# Patient Record
Sex: Male | Born: 1935 | Race: White | Hispanic: No | Smoking: Former smoker
Health system: Southern US, Community
[De-identification: ages and names within clinical notes are randomized; demographics above are authoritative.]

## PROBLEM LIST (undated history)

## (undated) DIAGNOSIS — R062 Wheezing: Secondary | ICD-10-CM

## (undated) DIAGNOSIS — J449 Chronic obstructive pulmonary disease, unspecified: Secondary | ICD-10-CM

## (undated) DIAGNOSIS — K219 Gastro-esophageal reflux disease without esophagitis: Secondary | ICD-10-CM

## (undated) DIAGNOSIS — E785 Hyperlipidemia, unspecified: Secondary | ICD-10-CM

## (undated) DIAGNOSIS — H9190 Unspecified hearing loss, unspecified ear: Secondary | ICD-10-CM

## (undated) DIAGNOSIS — R609 Edema, unspecified: Secondary | ICD-10-CM

## (undated) DIAGNOSIS — M1990 Unspecified osteoarthritis, unspecified site: Secondary | ICD-10-CM

## (undated) DIAGNOSIS — I10 Essential (primary) hypertension: Secondary | ICD-10-CM

## (undated) DIAGNOSIS — E119 Type 2 diabetes mellitus without complications: Secondary | ICD-10-CM

## (undated) DIAGNOSIS — R0600 Dyspnea, unspecified: Secondary | ICD-10-CM

## (undated) DIAGNOSIS — L570 Actinic keratosis: Secondary | ICD-10-CM

## (undated) DIAGNOSIS — J45909 Unspecified asthma, uncomplicated: Secondary | ICD-10-CM

## (undated) HISTORY — PX: EYE SURGERY: SHX253

## (undated) HISTORY — PX: BUNIONECTOMY: SHX129

## (undated) HISTORY — DX: Essential (primary) hypertension: I10

## (undated) HISTORY — DX: Hyperlipidemia, unspecified: E78.5

## (undated) HISTORY — DX: Actinic keratosis: L57.0

## (undated) HISTORY — PX: HAMMER TOE SURGERY: SHX385

## (undated) SURGERY — ELECTROMAGNETIC NAVIGATION BRONCHOSCOPY
Anesthesia: General | Laterality: Left

---
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
## 2014-02-05 DIAGNOSIS — E782 Mixed hyperlipidemia: Secondary | ICD-10-CM | POA: Insufficient documentation

## 2014-08-01 HISTORY — PX: COLONOSCOPY: SHX174

## 2014-08-29 DIAGNOSIS — K635 Polyp of colon: Secondary | ICD-10-CM | POA: Insufficient documentation

## 2014-09-19 ENCOUNTER — Ambulatory Visit: Payer: Self-pay | Admitting: Gastroenterology

## 2014-11-24 LAB — SURGICAL PATHOLOGY

## 2015-01-08 DIAGNOSIS — I10 Essential (primary) hypertension: Secondary | ICD-10-CM | POA: Insufficient documentation

## 2015-01-19 DIAGNOSIS — E119 Type 2 diabetes mellitus without complications: Secondary | ICD-10-CM | POA: Insufficient documentation

## 2015-09-22 DIAGNOSIS — E669 Obesity, unspecified: Secondary | ICD-10-CM | POA: Diagnosis not present

## 2015-09-22 DIAGNOSIS — J449 Chronic obstructive pulmonary disease, unspecified: Secondary | ICD-10-CM | POA: Diagnosis not present

## 2015-09-22 DIAGNOSIS — E539 Vitamin B deficiency, unspecified: Secondary | ICD-10-CM | POA: Diagnosis not present

## 2015-09-22 DIAGNOSIS — R5383 Other fatigue: Secondary | ICD-10-CM | POA: Diagnosis not present

## 2015-09-22 DIAGNOSIS — J45998 Other asthma: Secondary | ICD-10-CM | POA: Diagnosis not present

## 2015-09-22 DIAGNOSIS — E119 Type 2 diabetes mellitus without complications: Secondary | ICD-10-CM | POA: Diagnosis not present

## 2015-09-22 DIAGNOSIS — E7800 Pure hypercholesterolemia, unspecified: Secondary | ICD-10-CM | POA: Diagnosis not present

## 2015-09-22 DIAGNOSIS — E559 Vitamin D deficiency, unspecified: Secondary | ICD-10-CM | POA: Diagnosis not present

## 2015-09-22 DIAGNOSIS — E785 Hyperlipidemia, unspecified: Secondary | ICD-10-CM | POA: Diagnosis not present

## 2015-10-01 DIAGNOSIS — M545 Low back pain: Secondary | ICD-10-CM | POA: Diagnosis not present

## 2015-10-01 DIAGNOSIS — E119 Type 2 diabetes mellitus without complications: Secondary | ICD-10-CM | POA: Diagnosis not present

## 2015-10-01 DIAGNOSIS — R0989 Other specified symptoms and signs involving the circulatory and respiratory systems: Secondary | ICD-10-CM | POA: Diagnosis not present

## 2015-10-09 DIAGNOSIS — R5383 Other fatigue: Secondary | ICD-10-CM | POA: Diagnosis not present

## 2015-10-09 DIAGNOSIS — E539 Vitamin B deficiency, unspecified: Secondary | ICD-10-CM | POA: Diagnosis not present

## 2015-10-09 DIAGNOSIS — E119 Type 2 diabetes mellitus without complications: Secondary | ICD-10-CM | POA: Diagnosis not present

## 2015-10-09 DIAGNOSIS — E785 Hyperlipidemia, unspecified: Secondary | ICD-10-CM | POA: Diagnosis not present

## 2015-11-13 ENCOUNTER — Encounter: Payer: Self-pay | Admitting: Emergency Medicine

## 2015-11-13 ENCOUNTER — Emergency Department
Admission: EM | Admit: 2015-11-13 | Discharge: 2015-11-13 | Disposition: A | Discharge status: Home / Self Care | Payer: Medicare Other | Attending: Emergency Medicine | Admitting: Emergency Medicine

## 2015-11-13 DIAGNOSIS — J45909 Unspecified asthma, uncomplicated: Secondary | ICD-10-CM | POA: Diagnosis not present

## 2015-11-13 DIAGNOSIS — E119 Type 2 diabetes mellitus without complications: Secondary | ICD-10-CM | POA: Insufficient documentation

## 2015-11-13 DIAGNOSIS — R040 Epistaxis: Secondary | ICD-10-CM | POA: Insufficient documentation

## 2015-11-13 DIAGNOSIS — Z87891 Personal history of nicotine dependence: Secondary | ICD-10-CM | POA: Diagnosis not present

## 2015-11-13 HISTORY — DX: Type 2 diabetes mellitus without complications: E11.9

## 2015-11-13 HISTORY — DX: Unspecified asthma, uncomplicated: J45.909

## 2015-11-13 HISTORY — DX: Unspecified osteoarthritis, unspecified site: M19.90

## 2015-11-13 MED ORDER — BACITRACIN ZINC 500 UNIT/GM EX OINT
TOPICAL_OINTMENT | Freq: Once | CUTANEOUS | Status: AC
  Administered 2015-11-13: 1 via TOPICAL

## 2015-11-13 MED ORDER — BACITRACIN ZINC 500 UNIT/GM EX OINT
TOPICAL_OINTMENT | CUTANEOUS | Status: AC
  Administered 2015-11-13: 1 via TOPICAL
  Filled 2015-11-13: qty 0.9

## 2015-11-13 NOTE — ED Notes (Signed)
Patient reports nose bleed started day before yesterday and stopped.  This morning patient states it started back as a trickle and then increased to steady flow.  Patient states blood is thick.  Denies injury. Patient states he is not on any blood thinners not even aspirin. Nasal clamp removed at this time per patient request as it does not stay on.

## 2015-11-13 NOTE — Discharge Instructions (Signed)
Use an antibacterial ointment such as Neosporin, or Vaseline to the middle, inside of your nose 2-3 times a day. This will prevent further bleeding. You may also use the nasal clips but did not use them for more than 15 minutes at a time. Nosebleed Nosebleeds are common. A nosebleed can be caused by many things, including:  Getting hit hard in the nose.  Infections.  Dryness in your nose.  A dry climate.  Medicines.  Picking your nose.  Your home heating and cooling systems. HOME CARE   Try controlling your nosebleed by pinching your nostrils gently. Do this for at least 10 minutes.  Avoid blowing or sniffing your nose for a number of hours after having a nosebleed.  Do not put gauze inside of your nose yourself. If your nose was packed by your doctor, try to keep the pack inside of your nose until your doctor removes it.  If a gauze pack was used and it starts to fall out, gently replace it or cut off the end of it.  If a balloon catheter was used to pack your nose, do not cut or remove it unless told by your doctor.  Avoid lying down while you are having a nosebleed. Sit up and lean forward.  Use a nasal spray decongestant to help with a nosebleed as told by your doctor.  Do not use petroleum jelly or mineral oil in your nose. These can drip into your lungs.  Keep your house humid by using:  Less air conditioning.  A humidifier.  Aspirin and blood thinners make bleeding more likely. If you are prescribed these medicines and you have nosebleeds, ask your doctor if you should stop taking the medicines or adjust the dose. Do not stop medicines unless told by your doctor.  Resume your normal activities as you are able. Avoid straining, lifting, or bending at your waist for several days.  If your nosebleed was caused by dryness in your nose, use over-the-counter saline nasal spray or gel. If you must use a lubricant:  Choose one that is water-soluble.  Use it only as  needed.  Do not use it within several hours of lying down.  Keep all follow-up visits as told by your doctor. This is important. GET HELP IF:  You have a fever.  You get frequent nosebleeds.  You are getting nosebleeds more often. GET HELP RIGHT AWAY IF:  Your nosebleed lasts longer than 20 minutes.  Your nosebleed occurs after an injury to your face, and your nose looks crooked or broken.  You have unusual bleeding from other parts of your body.  You have unusual bruising on other parts of your body.  You feel light-headed or dizzy.  You become sweaty.  You throw up (vomit) blood.  You have a nosebleed after a head injury.   This information is not intended to replace advice given to you by your health care provider. Make sure you discuss any questions you have with your health care provider.   Document Released: 04/26/2008 Document Revised: 08/08/2014 Document Reviewed: 03/03/2014 Elsevier Interactive Patient Education [employer].

## 2015-11-13 NOTE — ED Provider Notes (Signed)
[facility] Emergency Department Provider Note  ____________________________________________  Time seen: Approximately [id number] PM  I have reviewed the triage vital signs and the nursing notes.   HISTORY  Chief Complaint Epistaxis   HPI Cody Bridges is a 82 y.o. male with a history of diabetes who is presenting to the emergency department today with epistaxis, especially from the right near. He says it has been intermittent all day and started his trickle and then increased to a steady flow. He also says he has had several small clots. Denies being on any blood thinners, even aspirin. He denies picking his nose. Says that he did feel he had some nasal congestion recently and was trying to blow his nose when the bleeding began.Says that he has had a history of bloody noses in the last time he needed one was several years ago.   Past Medical History  Diagnosis Date  . Diabetes mellitus without complication ([place])   . Arthritis   . Asthma     There are no active problems to display for this patient.   No past surgical history on file.  No current outpatient prescriptions on file.  Allergies Review of patient's allergies indicates not on file.  No family history on file.  Social History Social History  Substance Use Topics  . Smoking status: Former Research scientist (life sciences)  . Smokeless tobacco: None  . Alcohol Use: No    Review of Systems Constitutional: No fever/chills Eyes: No visual changes. ENT: No sore throat. Cardiovascular: Denies chest pain. Respiratory: Denies shortness of breath. Gastrointestinal: No abdominal pain.  No nausea, no vomiting.  No diarrhea.  No constipation. Genitourinary: Negative for dysuria. Musculoskeletal: Negative for back pain. Skin: Negative for rash. Neurological: Negative for headaches, focal weakness or numbness.  10-point ROS otherwise negative.  ____________________________________________   PHYSICAL EXAM:  VITAL  SIGNS: ED Triage Vitals  Enc Vitals Group     BP 11/13/15 1527 165/95 mmHg     Pulse Rate 11/13/15 1527 111     Resp 11/13/15 1527 20     Temp 11/13/15 1527 98.5 F (36.9 C)     Temp Source 11/13/15 1527 Oral     SpO2 11/13/15 1527 96 %     Weight 11/13/15 1527 200 lb (90.719 kg)     Height 11/13/15 1527 5\' 10"  (1.778 m)     Head Cir --      Peak Flow --      Pain Score 11/13/15 1528 0     Pain Loc --      Pain Edu? --      Excl. in [place]? --     Constitutional: Alert and oriented. Well appearing and in no acute distress. Eyes: Conjunctivae are normal. PERRL. EOMI. Head: Atraumatic. Nose: No congestion/rhinnorhea. Right sided nasal septum with several small and prominent vessels over the anterior septum. No active bleeding. Left side of the septum again noted with several very small prominent vessels without any active bleeding. Mouth/Throat: Mucous membranes are moist.  No bleeding to the posterior pharynx. Neck: No stridor.   Cardiovascular: Normal rate, regular rhythm. Heart rate 88 in the room. Respiratory: Normal respiratory effort.  No retractions. Lungs CTAB. Gastrointestinal: Soft and nontender. No distention.  Musculoskeletal: No lower extremity tenderness nor edema.  No joint effusions. Neurologic:  Normal speech and language. No gross focal neurologic deficits are appreciated. Skin:  Skin is warm, dry and intact. No rash noted. Psychiatric: Mood and affect are normal. Speech and behavior  are normal.  ____________________________________________   LABS (all labs ordered are listed, but only abnormal results are displayed)  Labs Reviewed - No data to display ____________________________________________  EKG   ____________________________________________  RADIOLOGY   ____________________________________________   PROCEDURES  ____________________________________________   INITIAL IMPRESSION / ASSESSMENT AND PLAN / ED COURSE  Pertinent labs & imaging  results that were available during my care of the patient were reviewed by me and considered in my medical decision making (see chart for details).  Instructed the patient and proper way to use the nasal clamps and knows to use them for 15 minutes of his nose against the bleeding again. We will be giving him bacitracin to the bilateral nasal septum prior to discharge. He knows to use antibacterial ointment such as Neosporin or Vaseline 2-3 times a day to further prevent any nosebleeds. Understands plan and willing to comply. Given the nasal clips to go home with. ____________________________________________   FINAL CLINICAL IMPRESSION(S) / ED DIAGNOSES  Anterior epistaxis.    Orbie Pyo, MD 11/13/15 [phone]

## 2015-12-17 DIAGNOSIS — B351 Tinea unguium: Secondary | ICD-10-CM | POA: Diagnosis not present

## 2015-12-17 DIAGNOSIS — M79671 Pain in right foot: Secondary | ICD-10-CM | POA: Diagnosis not present

## 2015-12-17 DIAGNOSIS — M79672 Pain in left foot: Secondary | ICD-10-CM | POA: Diagnosis not present

## 2015-12-30 DIAGNOSIS — E7800 Pure hypercholesterolemia, unspecified: Secondary | ICD-10-CM | POA: Diagnosis not present

## 2015-12-30 DIAGNOSIS — E119 Type 2 diabetes mellitus without complications: Secondary | ICD-10-CM | POA: Diagnosis not present

## 2015-12-30 DIAGNOSIS — E785 Hyperlipidemia, unspecified: Secondary | ICD-10-CM | POA: Diagnosis not present

## 2015-12-30 DIAGNOSIS — J45998 Other asthma: Secondary | ICD-10-CM | POA: Diagnosis not present

## 2015-12-30 DIAGNOSIS — R5383 Other fatigue: Secondary | ICD-10-CM | POA: Diagnosis not present

## 2015-12-30 DIAGNOSIS — J449 Chronic obstructive pulmonary disease, unspecified: Secondary | ICD-10-CM | POA: Diagnosis not present

## 2015-12-30 DIAGNOSIS — E559 Vitamin D deficiency, unspecified: Secondary | ICD-10-CM | POA: Diagnosis not present

## 2016-01-27 DIAGNOSIS — E782 Mixed hyperlipidemia: Secondary | ICD-10-CM | POA: Diagnosis not present

## 2016-01-27 DIAGNOSIS — E119 Type 2 diabetes mellitus without complications: Secondary | ICD-10-CM | POA: Diagnosis not present

## 2016-01-27 DIAGNOSIS — I10 Essential (primary) hypertension: Secondary | ICD-10-CM | POA: Diagnosis not present

## 2016-01-27 DIAGNOSIS — R0609 Other forms of dyspnea: Secondary | ICD-10-CM | POA: Diagnosis not present

## 2016-03-03 DIAGNOSIS — Z1389 Encounter for screening for other disorder: Secondary | ICD-10-CM | POA: Diagnosis not present

## 2016-03-03 DIAGNOSIS — E119 Type 2 diabetes mellitus without complications: Secondary | ICD-10-CM | POA: Diagnosis not present

## 2016-03-03 DIAGNOSIS — E559 Vitamin D deficiency, unspecified: Secondary | ICD-10-CM | POA: Diagnosis not present

## 2016-03-03 DIAGNOSIS — I10 Essential (primary) hypertension: Secondary | ICD-10-CM | POA: Diagnosis not present

## 2016-03-03 DIAGNOSIS — Z0000 Encounter for general adult medical examination without abnormal findings: Secondary | ICD-10-CM | POA: Diagnosis not present

## 2016-03-03 DIAGNOSIS — R5383 Other fatigue: Secondary | ICD-10-CM | POA: Diagnosis not present

## 2016-03-03 DIAGNOSIS — E785 Hyperlipidemia, unspecified: Secondary | ICD-10-CM | POA: Diagnosis not present

## 2016-03-03 DIAGNOSIS — M545 Low back pain: Secondary | ICD-10-CM | POA: Diagnosis not present

## 2016-03-03 DIAGNOSIS — E7800 Pure hypercholesterolemia, unspecified: Secondary | ICD-10-CM | POA: Diagnosis not present

## 2016-04-18 DIAGNOSIS — Z23 Encounter for immunization: Secondary | ICD-10-CM | POA: Diagnosis not present

## 2016-06-01 DIAGNOSIS — N400 Enlarged prostate without lower urinary tract symptoms: Secondary | ICD-10-CM | POA: Diagnosis not present

## 2016-06-01 DIAGNOSIS — E119 Type 2 diabetes mellitus without complications: Secondary | ICD-10-CM | POA: Diagnosis not present

## 2016-06-01 DIAGNOSIS — E7800 Pure hypercholesterolemia, unspecified: Secondary | ICD-10-CM | POA: Diagnosis not present

## 2016-06-01 DIAGNOSIS — N5201 Erectile dysfunction due to arterial insufficiency: Secondary | ICD-10-CM | POA: Diagnosis not present

## 2016-06-01 DIAGNOSIS — J449 Chronic obstructive pulmonary disease, unspecified: Secondary | ICD-10-CM | POA: Diagnosis not present

## 2016-06-01 DIAGNOSIS — R5381 Other malaise: Secondary | ICD-10-CM | POA: Diagnosis not present

## 2016-06-01 DIAGNOSIS — E559 Vitamin D deficiency, unspecified: Secondary | ICD-10-CM | POA: Diagnosis not present

## 2016-06-01 DIAGNOSIS — E669 Obesity, unspecified: Secondary | ICD-10-CM | POA: Diagnosis not present

## 2016-06-01 DIAGNOSIS — E785 Hyperlipidemia, unspecified: Secondary | ICD-10-CM | POA: Diagnosis not present

## 2016-08-30 DIAGNOSIS — E782 Mixed hyperlipidemia: Secondary | ICD-10-CM | POA: Diagnosis not present

## 2016-08-30 DIAGNOSIS — E119 Type 2 diabetes mellitus without complications: Secondary | ICD-10-CM | POA: Diagnosis not present

## 2016-08-30 DIAGNOSIS — R0602 Shortness of breath: Secondary | ICD-10-CM | POA: Insufficient documentation

## 2016-08-30 DIAGNOSIS — I10 Essential (primary) hypertension: Secondary | ICD-10-CM | POA: Diagnosis not present

## 2016-09-07 ENCOUNTER — Telehealth: Payer: Self-pay | Admitting: *Deleted

## 2016-09-07 DIAGNOSIS — E785 Hyperlipidemia, unspecified: Secondary | ICD-10-CM | POA: Diagnosis not present

## 2016-09-07 DIAGNOSIS — R7989 Other specified abnormal findings of blood chemistry: Secondary | ICD-10-CM | POA: Diagnosis not present

## 2016-09-07 DIAGNOSIS — E7800 Pure hypercholesterolemia, unspecified: Secondary | ICD-10-CM | POA: Diagnosis not present

## 2016-09-07 DIAGNOSIS — R5383 Other fatigue: Secondary | ICD-10-CM | POA: Diagnosis not present

## 2016-09-07 DIAGNOSIS — E559 Vitamin D deficiency, unspecified: Secondary | ICD-10-CM | POA: Diagnosis not present

## 2016-09-07 DIAGNOSIS — E119 Type 2 diabetes mellitus without complications: Secondary | ICD-10-CM | POA: Diagnosis not present

## 2016-09-07 NOTE — Telephone Encounter (Signed)
I'm sorry but I cannot take on any more patients at this time.

## 2016-09-07 NOTE — Telephone Encounter (Signed)
Pt requested to establish care with Dr. Derrel Nip . Pt is aware that Dr. Derrel Nip is not accepting pt .

## 2016-09-08 NOTE — Telephone Encounter (Signed)
I called pt to advise of below and offer another provider who is accepting new patients. No answer.  Left detailed mess informing pt to call us back and schedule with a different provider if he desires to do so.

## 2016-09-20 ENCOUNTER — Encounter: Payer: Self-pay | Admitting: Family Medicine

## 2016-09-20 ENCOUNTER — Ambulatory Visit (INDEPENDENT_AMBULATORY_CARE_PROVIDER_SITE_OTHER): Payer: Medicare Other | Admitting: Family Medicine

## 2016-09-20 VITALS — BP 132/80 | HR 86 | Temp 97.6°F | Ht 70.0 in | Wt 209.6 lb

## 2016-09-20 DIAGNOSIS — E119 Type 2 diabetes mellitus without complications: Secondary | ICD-10-CM | POA: Diagnosis not present

## 2016-09-20 DIAGNOSIS — R0609 Other forms of dyspnea: Secondary | ICD-10-CM | POA: Diagnosis not present

## 2016-09-20 DIAGNOSIS — J449 Chronic obstructive pulmonary disease, unspecified: Secondary | ICD-10-CM | POA: Insufficient documentation

## 2016-09-20 DIAGNOSIS — J4540 Moderate persistent asthma, uncomplicated: Secondary | ICD-10-CM | POA: Diagnosis not present

## 2016-09-20 DIAGNOSIS — R0600 Dyspnea, unspecified: Secondary | ICD-10-CM

## 2016-09-20 DIAGNOSIS — K439 Ventral hernia without obstruction or gangrene: Secondary | ICD-10-CM | POA: Diagnosis not present

## 2016-09-20 MED ORDER — SYMBICORT 160-4.5 MCG/ACT IN AERO: 2.0000 | INHALATION_SPRAY | Freq: Two times a day (BID) | RESPIRATORY_TRACT | 3 refills | Status: DC

## 2016-09-20 NOTE — Progress Notes (Signed)
[name], MD Phone: [phone]  Cody Bridges is a 83 y.o. male who presents today for new patient exam.  Patient notes 2-3 weeks ago when he was at the cardiology office he noted a bulge in his abdomen when he is laying down. There is no pain in this area. It always reduces. He notes he's had daily bowel movements. No history of a hernia. No prior abdominal surgeries. Was advised by his prior PCP that this was not worrisome and did not need to be evaluated per his report.  Patient has a history of asthma. He notes chronic exertional dyspnea that may have gotten worse over a number of months. He's been evaluated by cardiology and it appears they did not think it was cardiac related. No further workup was undertaken by cardiology. He does wheeze occasionally. No cough. No nighttime symptoms. He has not seen a pulmonologist. Patient does note he uses albuterol inhaler several times a day.  Patient is also on metformin for diabetes. He reports an A1c was checked fairly recently through his prior PCP.  Active Ambulatory Problems    Diagnosis Date Noted  . Ventral hernia 09/20/2016  . Diabetes mellitus without complication ([place]) [id number]  . Asthma 09/20/2016   Resolved Ambulatory Problems    Diagnosis Date Noted  . No Resolved Ambulatory Problems   Past Medical History:  Diagnosis Date  . Arthritis   . Asthma   . Diabetes mellitus without complication ([place])   . Hyperlipidemia   . Hypertension     Family History  Problem Relation Age of Onset  . Cancer Mother   . Cancer Sister     Social History   Social History  . Marital status: Widowed    Spouse name: N/A  . Number of children: N/A  . Years of education: N/A   Occupational History  . Not on file.   Social History Main Topics  . Smoking status: Former Research scientist (life sciences)  . Smokeless tobacco: Never Used  . Alcohol use No  . Drug use: No  . Sexual activity: Not on file   Other Topics Concern  . Not on file    Social History Narrative  . No narrative on file    ROS  General:  Negative for nexplained weight loss, fever Skin: Negative for new or changing mole, sore that won't heal HEENT: Negative for trouble hearing, trouble seeing, ringing in ears, mouth sores, hoarseness, change in voice, dysphagia. CV:  Negative for chest pain, edema, palpitations Resp: Positive for dyspnea, Negative for cough, hemoptysis GI: Negative for nausea, vomiting, diarrhea, constipation, abdominal pain, melena, hematochezia. GU: Negative for dysuria, incontinence, urinary hesitance, hematuria, vaginal or penile discharge, polyuria, sexual difficulty, lumps in testicle or breasts MSK: Negative for muscle cramps or aches, joint pain or swelling Neuro: Negative for headaches, weakness, numbness, dizziness, passing out/fainting Psych: Negative for depression, anxiety, memory problems  Objective  Physical Exam Vitals:   09/20/16 1456  BP: 132/80  Pulse: 86  Temp: 97.6 F (36.4 C)    BP Readings from Last 3 Encounters:  09/20/16 132/80  11/13/15 (!) 125/96   Wt Readings from Last 3 Encounters:  09/20/16 209 lb 9.6 oz (95.1 kg)  11/13/15 200 lb (90.7 kg)    Physical Exam  Constitutional: No distress.  HENT:  Head: Normocephalic and atraumatic.  Mouth/Throat: Oropharynx is clear and moist. No oropharyngeal exudate.  Eyes: Conjunctivae are normal. Pupils are equal, round, and reactive to light.  Cardiovascular: Normal rate, regular rhythm and  normal heart sounds.   Pulmonary/Chest: Effort normal and breath sounds normal.  Abdominal: Soft. Bowel sounds are normal. He exhibits no distension and no mass. There is no tenderness. There is no rebound and no guarding.  Possible ventral hernia just proximal to the umbilicus though this could represent rectus diastases  Musculoskeletal: He exhibits no edema.  Neurological: He is alert. Gait normal.  Skin: Skin is warm and dry. He is not diaphoretic.   Psychiatric: Mood and affect normal.     Assessment/Plan:   Ventral hernia Patient with potential ventral hernia on exam. Could also be rectus diastases. Fully reduces. No signs of obstruction or incarceration. We will obtain a CT scan to evaluate further. Given return precautions.  Diabetes mellitus without complication (HCC) Continue metformin. We'll request records from his prior PCP.  Asthma Suspect patient's dyspnea is likely related to his asthma that is undertreated given prior evaluation by cardiology felt to be no cardiac component. Discussed that the nebulizer medication has a similar medication in it to his Symbicort. He will discontinue the nebulizer. He has not been using the Symbicort twice daily on a consistent basis. Discussed having him take this twice daily. He can continue the albuterol as needed. We will refer to pulmonology for further evaluation. He is given return precautions.   Orders Placed This Encounter  Procedures  . CT Abdomen Pelvis W Contrast    Standing Status:   Future    Standing Expiration Date:   12/18/2017    Order Specific Question:   If indicated for the ordered procedure, I authorize the administration of contrast media per Radiology protocol    Answer:   Yes    Order Specific Question:   Reason for Exam (SYMPTOM  OR DIAGNOSIS REQUIRED)    Answer:   possible ventral hernia with no signs of incarceration or obstruction    Order Specific Question:   Preferred imaging location?    Answer:   [facility]  . Ambulatory referral to Pulmonology    Referral Priority:   Routine    Referral Type:   Consultation    Referral Reason:   Specialty Services Required    Requested Specialty:   Pulmonary Disease    Number of Visits Requested:   [place], MD [place]

## 2016-09-20 NOTE — Assessment & Plan Note (Signed)
Continue metformin. We'll request records from his prior PCP.

## 2016-09-20 NOTE — Patient Instructions (Addendum)
Nice to meet you. We will request your records from your prior PCPs office to obtain lab work. We will obtain a CT scan of your abdomen and pelvis to evaluate for hernia. We will refer you to pulmonology for evaluation of her dyspnea and asthma. Please consistently use the symbicort 2 puffs twice daily. Please stop using the brovana nebulizer.  If you become increasingly short of breath, develop chest pain, abdominal pain, or hernia becomes stuck out or painful, or any new or changing symptoms please seek medical attention.

## 2016-09-20 NOTE — Assessment & Plan Note (Signed)
Suspect patient's dyspnea is likely related to his asthma that is undertreated given prior evaluation by cardiology felt to be no cardiac component. Discussed that the nebulizer medication has a similar medication in it to his Symbicort. He will discontinue the nebulizer. He has not been using the Symbicort twice daily on a consistent basis. Discussed having him take this twice daily. He can continue the albuterol as needed. We will refer to pulmonology for further evaluation. He is given return precautions.

## 2016-09-20 NOTE — Assessment & Plan Note (Addendum)
Patient with potential ventral hernia on exam. Could also be rectus diastases. Fully reduces. No signs of obstruction or incarceration. We will obtain a CT scan to evaluate further. Given return precautions.

## 2016-09-20 NOTE — Progress Notes (Signed)
Pre visit review using our clinic review tool, if applicable. No additional management support is needed unless otherwise documented below in the visit note. 

## 2016-09-30 ENCOUNTER — Ambulatory Visit
Admission: RE | Admit: 2016-09-30 | Discharge: 2016-09-30 | Disposition: A | Discharge status: Home / Self Care | Payer: Medicare Other | Source: Ambulatory Visit | Attending: Family Medicine | Admitting: Family Medicine

## 2016-09-30 DIAGNOSIS — I700 Atherosclerosis of aorta: Secondary | ICD-10-CM | POA: Diagnosis not present

## 2016-09-30 DIAGNOSIS — K760 Fatty (change of) liver, not elsewhere classified: Secondary | ICD-10-CM | POA: Insufficient documentation

## 2016-09-30 DIAGNOSIS — K439 Ventral hernia without obstruction or gangrene: Secondary | ICD-10-CM | POA: Diagnosis present

## 2016-09-30 DIAGNOSIS — K5730 Diverticulosis of large intestine without perforation or abscess without bleeding: Secondary | ICD-10-CM | POA: Diagnosis not present

## 2016-09-30 DIAGNOSIS — R1903 Right lower quadrant abdominal swelling, mass and lump: Secondary | ICD-10-CM | POA: Diagnosis not present

## 2016-09-30 DIAGNOSIS — K4090 Unilateral inguinal hernia, without obstruction or gangrene, not specified as recurrent: Secondary | ICD-10-CM | POA: Insufficient documentation

## 2016-09-30 LAB — POCT I-STAT CREATININE: Creatinine, Ser: 1.1 mg/dL (ref 0.61–1.24)

## 2016-09-30 MED ORDER — IOPAMIDOL (ISOVUE-300) INJECTION 61%
100.0000 mL | Freq: Once | INTRAVENOUS | Status: AC | PRN
  Administered 2016-09-30: 100 mL via INTRAVENOUS

## 2016-10-07 ENCOUNTER — Other Ambulatory Visit: Payer: Self-pay | Admitting: Family Medicine

## 2016-10-07 DIAGNOSIS — K4090 Unilateral inguinal hernia, without obstruction or gangrene, not specified as recurrent: Secondary | ICD-10-CM

## 2016-10-17 ENCOUNTER — Encounter: Payer: Self-pay | Admitting: *Deleted

## 2016-10-24 ENCOUNTER — Encounter: Payer: Self-pay | Admitting: General Surgery

## 2016-10-24 ENCOUNTER — Encounter: Payer: Self-pay | Admitting: Family Medicine

## 2016-10-24 ENCOUNTER — Ambulatory Visit (INDEPENDENT_AMBULATORY_CARE_PROVIDER_SITE_OTHER): Payer: Medicare Other | Admitting: General Surgery

## 2016-10-24 ENCOUNTER — Ambulatory Visit (INDEPENDENT_AMBULATORY_CARE_PROVIDER_SITE_OTHER): Payer: Medicare Other | Admitting: Family Medicine

## 2016-10-24 VITALS — BP 132/74 | HR 66 | Resp 12 | Ht 70.0 in | Wt 207.0 lb

## 2016-10-24 VITALS — BP 120/70 | HR 87 | Temp 98.1°F | Wt 208.4 lb

## 2016-10-24 DIAGNOSIS — R197 Diarrhea, unspecified: Secondary | ICD-10-CM | POA: Diagnosis not present

## 2016-10-24 DIAGNOSIS — J4540 Moderate persistent asthma, uncomplicated: Secondary | ICD-10-CM

## 2016-10-24 DIAGNOSIS — M6208 Separation of muscle (nontraumatic), other site: Secondary | ICD-10-CM | POA: Diagnosis not present

## 2016-10-24 DIAGNOSIS — K4090 Unilateral inguinal hernia, without obstruction or gangrene, not specified as recurrent: Secondary | ICD-10-CM

## 2016-10-24 DIAGNOSIS — E119 Type 2 diabetes mellitus without complications: Secondary | ICD-10-CM

## 2016-10-24 DIAGNOSIS — I10 Essential (primary) hypertension: Secondary | ICD-10-CM | POA: Insufficient documentation

## 2016-10-24 DIAGNOSIS — R21 Rash and other nonspecific skin eruption: Secondary | ICD-10-CM

## 2016-10-24 NOTE — Patient Instructions (Signed)
Patient to return as needed. 

## 2016-10-24 NOTE — Progress Notes (Signed)
  [name], MD Phone: [phone]  Cody Bridges is a 83 y.o. male who presents today for follow-up.  Patient reports he had diarrhea starting 1-2 days after his CT scan. Notes it tapered off and has resolved over the last week. No blood in his stool. He did have nausea and vomiting as well. No other changes noted. No issues currently.  Asthma: Continues to have issues with breathing. No chest pain. His breathing is unchanged. He is using Symbicort twice a day. Does note some wheezes. Not much cough. He sees pulmonology tomorrow. He's been evaluated by cardiology and his symptoms were felt not to be related to a cardiac cause.  Found to have a right inguinal hernia on CT scan. He saw Gen. surgery today and they recommended monitoring it. He notes no pain or bulges.  He notes a rash for the last couple of months on his right back. Notes it itches. He notes no new soaps or detergents. No contacts with this. No fevers. He's been using triamcinolone lotion with some benefit.  PMH: Former smoker   ROS see history of present illness  Objective  Physical Exam Vitals:   10/24/16 1600  BP: 120/70  Pulse: 87  Temp: 98.1 F (36.7 C)    BP Readings from Last 3 Encounters:  10/25/16 130/80  10/24/16 120/70  10/24/16 132/74   Wt Readings from Last 3 Encounters:  10/25/16 207 lb (93.9 kg)  10/24/16 208 lb 6.4 oz (94.5 kg)  10/24/16 207 lb (93.9 kg)    Physical Exam  Constitutional: No distress.  Cardiovascular: Normal rate, regular rhythm and normal heart sounds.   Pulmonary/Chest: Effort normal and breath sounds normal.  Abdominal: Soft. Bowel sounds are normal. He exhibits no distension. There is no tenderness. There is no rebound and no guarding.  Musculoskeletal: He exhibits no edema.  Neurological: He is alert. Gait normal.  Skin: Skin is warm and dry. He is not diaphoretic.  Scattered faint erythematous papular rash that has been excoriated over his right mid back      Assessment/Plan: Please see individual problem list.  Hypertension Well-controlled. Continue current medications. Check CMP.  Asthma Continues to have issues with this. No significant improvement with appropriate use of Symbicort. He will keep his appointment with the pulmonologist tomorrow.  Non-recurrent unilateral inguinal hernia without obstruction or gangrene Asymptomatic currently he will continue to monitor. Given hernia return precautions.  Diarrhea Suspect related to viral gastroenteritis given the accompanying nausea and vomiting. He has benign exam today. Asymptomatic. He will monitor for recurrence.  Rash and nonspecific skin eruption Nonspecific rash on his back. Has responded somewhat to triamcinolone. He can continue this. He can also change his soap to a moisturizing soap and use moisturizer on the area as well. Could consider Claritin as well for itching. If worsens let us know.   Orders Placed This Encounter  Procedures  . Comp Met (CMET)  . HgB A1c    [name], MD [place]

## 2016-10-24 NOTE — Assessment & Plan Note (Signed)
Well controlled Continue current medications Check CMP 

## 2016-10-24 NOTE — Patient Instructions (Signed)
Nice to see you. Please monitor for recurrence of your diarrhea. Please see the pulmonologist tomorrow. We'll check some lab work and contact you with the results. You can continue the triamcinolone lotion on your rash on her back. You may also switch to a dove soap or Aveeno lotion.

## 2016-10-24 NOTE — Progress Notes (Addendum)
Cody Bridges      Assessment and Plan:  Dyspnea.  -Uncertain etiology, this appears to be dyspnea on exertion primarily. Patient has decent functional status. -There was some pigment changes seen on the basis from his recent CT of the abdomen, this may be indicative of early pulmonary fibrosis and/or COPD. -We'll therefore check a only function test and a CT high-resolution to look for evidence of disease. -The patient appears to be forgetting to take his second dose of Symbicort, he is therefore using too much of his albuterol inhaler. We'll change him to Anoro to be used once daily, patient was cautioned to use his rescue inhaler only once every 4 hours.  Pulmonary fibrosis.  -As above. Will check CT high-resolution to look for evidence of pulmonary fibrosis.  Addendum:  Ct chest hi-res 5/21 personally reviewed; this shows fairly mild bibasilar ILD. More significantly there is a 2 cm irregular nodule in LUL, there is also mild emphysema. The nodule appears to accessible via ENB, there is minimal lymphadenopathy.   Date: 10/24/2016  MRN# [id number] Cody Bridges Nov 25, 1937  Referring Physician:   Kevin Fenton Bridges is a 83 y.o. old male seen in Bridges for chief complaint of:    Chief Complaint  Patient presents with  . Advice Only    ref by Cody Bridges: SOB w/exertion: no cough; chest tightness at times    HPI:  He notes that he is running out of breath, he is currently on symbicort about once per day; he uses proventil "more than I;m supposed to". He thinks that it helps when he uses it.  He has been diagnosed with asthma as a child, he has been on inhalers for several years, and notices that his breathing has been progressively worse.  He last smoked in '87. His father smoked, his 1st wife smoked. 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  He and his girlfriend live  independently in their own apt, they like to travel.   desat walk 10/25/16; Baseline sat on RA at rest 97% and HR 85. After walking 360 feet sat was 95% and HR 95; mild dyspnea.   Images reviewed: CT abdomen and pelvis. 09/30/16: Fine reticular changes at the bases bilaterally.  PMHX:   Past Medical History:  Diagnosis Date  . Arthritis   . Asthma   . Diabetes mellitus without complication ([place])     4481  . Hyperlipidemia   . Hypertension    Surgical Hx:  Past Surgical History:  Procedure Laterality Date  . BUNIONECTOMY    . COLONOSCOPY  2016  . HAMMER TOE SURGERY    . TONSILLECTOMY  1938   Family Hx:  Family History  Problem Relation Age of Onset  . Cancer Mother   . Stomach cancer Mother   . Stomach cancer Sister    Social Hx:   Social History  Substance Use Topics  . Smoking status: Former Research scientist (life sciences)  . Smokeless tobacco: Never Used  . Alcohol use No   Medication:   Reviewed.    Allergies:  Clindamycin and Penicillin v potassium  Review of Systems: Gen:  Denies  fever, sweats, chills HEENT: Denies blurred vision, double vision. bleeds, sore throat Cvc:  No dizziness, chest pain. Resp:   Denies cough or sputum production,  Gi: Denies swallowing difficulty, stomach pain. Gu:  Denies bladder incontinence, burning urine Ext:   No Joint pain,  stiffness. Skin: No skin rash,  hives  Endoc:  No polyuria, polydipsia. Psych: No depression, insomnia. Other:  All other systems were reviewed with the patient and were negative other that what is mentioned in the HPI.   Physical Examination:   VS: BP 130/80 (BP Location: Left Arm, Cuff Size: Normal)   Pulse 87   Wt 207 lb (93.9 kg)   SpO2 96%   BMI 29.70 kg/m   General Appearance: No distress  Neuro:without focal findings,  speech normal,  HEENT: PERRLA, EOM intact.   Pulmonary: normal breath sounds, No wheezing.  CardiovascularNormal S1,S2.  No m/r/g.   Abdomen: Benign, Soft, non-tender. Renal:  No costovertebral  tenderness  GU:  No performed at this time. Endoc: No evident thyromegaly, no signs of acromegaly. Skin:   warm, no rashes, no ecchymosis  Extremities: normal, no cyanosis, clubbing.  Other findings:    LABORATORY PANEL:   CBC No results for input(s): WBC, HGB, HCT, PLT in the last 168 hours. ------------------------------------------------------------------------------------------------------------------  Chemistries  No results for input(s): NA, K, CL, CO2, GLUCOSE, BUN, CREATININE, CALCIUM, MG, AST, ALT, ALKPHOS, BILITOT in the last 168 hours.  Invalid input(s): GFRCGP ------------------------------------------------------------------------------------------------------------------  Cardiac Enzymes No results for input(s): TROPONINI in the last 168 hours. ------------------------------------------------------------  RADIOLOGY:  No results found.     Thank  you for the Bridges and for allowing Cody Bridges to assist in the Bridges of your patient. Our recommendations are noted above.  Please contact us if we can be of further service.   Cody Stalker, MD.  Board Certified in Internal Medicine, Pulmonary Medicine, [place], and Sleep Medicine.  [facility] Office Number: [phone]  Cody Bridges, M.D.  Cody Bridges, M.D  10/24/2016

## 2016-10-24 NOTE — Progress Notes (Signed)
Pre visit review using our clinic review tool, if applicable. No additional management support is needed unless otherwise documented below in the visit note. 

## 2016-10-24 NOTE — Progress Notes (Signed)
Patient ID: Cody Bridges, male   DOB: April 02, 1933, 83 y.o.   MRN: [id number]  Chief Complaint  Patient presents with  . Other    HPI Cody Bridges is a 83 y.o. male here today for a evaluation of a ventral hernia.He noticed this area about two month during his visit with Dr. Nehemiah Massed while he was reclining on the examining table.  No pain. Patient had a ct scan done on 09/30/2016. He states he has been having diarrhea off and on about two weeks now. Some foods may trigger the diarrhea. The patient denies any blood or mucus in the stools.  The patient moved here from [place] about 6 years ago. [name] is present at visit.  HPI  Past Medical History:  Diagnosis Date  . Arthritis   . Asthma   . Diabetes mellitus without complication ([place])     4332  . Hyperlipidemia   . Hypertension     Past Surgical History:  Procedure Laterality Date  . BUNIONECTOMY    . COLONOSCOPY  2016  . HAMMER TOE SURGERY    . TONSILLECTOMY  1938    Family History  Problem Relation Age of Onset  . Cancer Mother   . Stomach cancer Mother   . Stomach cancer Sister     Social History Social History  Substance Use Topics  . Smoking status: Former Research scientist (life sciences)  . Smokeless tobacco: Never Used  . Alcohol use No    Allergies  Allergen Reactions  . Clindamycin Hives  . Penicillin V Potassium Rash    Current Outpatient Prescriptions  Medication Sig Dispense Refill  . ACCU-CHEK AVIVA test strip TEST EVERY DAY  5  . ACCU-CHEK SOFTCLIX LANCETS lancets daily.  5  . Cholecalciferol (VITAMIN D) 2000 units tablet Take 5,000 Units by mouth daily.    . hydrochlorothiazide (HYDRODIURIL) 25 MG tablet Take 25 mg by mouth daily.    Marland Kitchen lisinopril (PRINIVIL,ZESTRIL) 5 MG tablet Take 5 mg by mouth daily.    . metFORMIN (GLUCOPHAGE) 500 MG tablet Take 500 mg by mouth 2 (two) times daily.    Marland Kitchen PROAIR HFA 108 (90 Base) MCG/ACT inhaler     . rosuvastatin (CRESTOR) 20 MG tablet Take 20 mg by mouth  daily.    . SYMBICORT 160-4.5 MCG/ACT inhaler Inhale 2 puffs into the lungs 2 (two) times daily. 1 Inhaler 3   No current facility-administered medications for this visit.     Review of Systems Review of Systems  Constitutional: Negative.   Respiratory: Negative.   Cardiovascular: Negative.   Gastrointestinal: Positive for diarrhea.    Blood pressure 132/74, pulse 66, resp. rate 12, height 5\' 10"  (1.778 m), weight 207 lb (93.9 kg), SpO2 97 %.  Physical Exam Physical Exam  Constitutional: He is oriented to person, place, and time. He appears well-nourished.  Eyes: Conjunctivae are normal. No scleral icterus.  Neck: Neck supple.  Cardiovascular: Normal rate, regular rhythm and normal heart sounds.   Pulmonary/Chest: Effort normal and breath sounds normal.  Abdominal: Soft. Normal appearance and bowel sounds are normal. There is no tenderness. A hernia is present. Hernia confirmed positive in the right inguinal area (small inguinal hernia).    Lymphadenopathy:    He has no cervical adenopathy.  Neurological: He is alert and oriented to person, place, and time.  Skin: Skin is warm and dry.    Data Reviewed CT of the abdomen and pelvis dated 09/30/2016 was reviewed. 1. There is no ventral  abdominal hernia but there is a right inguinal hernia which contains mesenteric fat and a small portion of the urinary bladder dome. This hernia does not appear incarcerated or inflamed. 2. Calcified aortic atherosclerosis and ectatic abdominal aorta at risk for aneurysm development. Recommend followup by Ultrasound in 5 years. This recommendation follows ACR consensus guidelines: White Paper of the ACR Incidental Findings Committee II on Vascular Findings. J Am Coll Radiol 2013; 10:789-794. 3. Hepatic steatosis. 4. Diverticulosis of the colon without active inflammation. Clinical exam corresponds with the radiographic imaging.   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  Asymptomatic right inguinal hernia.    Plan    Observation alone is warranted in regards to his right inguinal hernia present. The patient was encouraged to call should he develop pain that does not resolve when he assumes this up behind position or if its associated with any vomiting.  He reports nocturia 2-3 times per night, basically baseline. While the dome of the bladder is noted in the hernia I do not believe this is contributing to his nocturia.     Patient to return as needed.   This information has been scribed by Gaspar Cola CMA.   Christophere Bellow 10/24/2016, 2:53 PM

## 2016-10-25 ENCOUNTER — Encounter: Payer: Self-pay | Admitting: Internal Medicine

## 2016-10-25 ENCOUNTER — Ambulatory Visit (INDEPENDENT_AMBULATORY_CARE_PROVIDER_SITE_OTHER): Payer: Medicare Other | Admitting: Internal Medicine

## 2016-10-25 VITALS — BP 130/80 | HR 87 | Wt 207.0 lb

## 2016-10-25 DIAGNOSIS — R197 Diarrhea, unspecified: Secondary | ICD-10-CM | POA: Insufficient documentation

## 2016-10-25 DIAGNOSIS — J8410 Pulmonary fibrosis, unspecified: Secondary | ICD-10-CM | POA: Diagnosis not present

## 2016-10-25 DIAGNOSIS — J410 Simple chronic bronchitis: Secondary | ICD-10-CM

## 2016-10-25 DIAGNOSIS — R21 Rash and other nonspecific skin eruption: Secondary | ICD-10-CM | POA: Insufficient documentation

## 2016-10-25 LAB — COMPREHENSIVE METABOLIC PANEL
ALK PHOS: 69 U/L (ref 39–117)
ALT: 23 U/L (ref 0–53)
AST: 18 U/L (ref 0–37)
Albumin: 3.9 g/dL (ref 3.5–5.2)
BILIRUBIN TOTAL: 0.3 mg/dL (ref 0.2–1.2)
BUN: 21 mg/dL (ref 6–23)
CO2: 31 mEq/L (ref 19–32)
Calcium: 9.7 mg/dL (ref 8.4–10.5)
Chloride: 101 mEq/L (ref 96–112)
Creatinine, Ser: 1.09 mg/dL (ref 0.40–1.50)
GFR: 68.9 mL/min (ref >60.00)
GLUCOSE: 82 mg/dL (ref 70–99)
POTASSIUM: 4.1 meq/L (ref 3.5–5.1)
Sodium: 140 mEq/L (ref 135–145)
TOTAL PROTEIN: 7.5 g/dL (ref 6.0–8.3)

## 2016-10-25 LAB — HEMOGLOBIN A1C: HEMOGLOBIN A1C: 6.8 % — AB (ref 4.6–6.5)

## 2016-10-25 MED ORDER — UMECLIDINIUM-VILANTEROL 62.5-25 MCG/INH IN AEPB: 1.0000 | INHALATION_SPRAY | Freq: Every day | RESPIRATORY_TRACT | 4 refills | Status: DC

## 2016-10-25 NOTE — Assessment & Plan Note (Signed)
Continues to have issues with this. No significant improvement with appropriate use of Symbicort. He will keep his appointment with the pulmonologist tomorrow.

## 2016-10-25 NOTE — Assessment & Plan Note (Signed)
Asymptomatic currently he will continue to monitor. Given hernia return precautions.

## 2016-10-25 NOTE — Assessment & Plan Note (Signed)
Suspect related to viral gastroenteritis given the accompanying nausea and vomiting. He has benign exam today. Asymptomatic. He will monitor for recurrence.

## 2016-10-25 NOTE — Progress Notes (Signed)
Patient ID: Cody Bridges, male   DOB: 05/27/1933, 83 y.o.   MRN: [id number]   Patient seen in the office today and instructed on use of Anoro.  Patient expressed understanding and demonstrated technique.   Oscar La, LPN

## 2016-10-25 NOTE — Patient Instructions (Addendum)
--  Full pulmonary function test.   --CT chest hi-resolution.   --Change symbicort to Anoro to be used once daily.

## 2016-10-25 NOTE — Assessment & Plan Note (Signed)
Nonspecific rash on his back. Has responded somewhat to triamcinolone. He can continue this. He can also change his soap to a moisturizing soap and use moisturizer on the area as well. Could consider Claritin as well for itching. If worsens let us know.

## 2016-11-30 ENCOUNTER — Ambulatory Visit: Payer: 59

## 2016-12-07 ENCOUNTER — Ambulatory Visit: Payer: 59 | Admitting: Internal Medicine

## 2016-12-14 ENCOUNTER — Ambulatory Visit: Payer: 59

## 2016-12-15 ENCOUNTER — Ambulatory Visit: Payer: Medicare Other | Attending: Internal Medicine

## 2016-12-15 DIAGNOSIS — J42 Unspecified chronic bronchitis: Secondary | ICD-10-CM | POA: Diagnosis not present

## 2016-12-15 DIAGNOSIS — J410 Simple chronic bronchitis: Secondary | ICD-10-CM

## 2016-12-15 MED ORDER — ALBUTEROL SULFATE (2.5 MG/3ML) 0.083% IN NEBU
2.5000 mg | INHALATION_SOLUTION | Freq: Once | RESPIRATORY_TRACT | Status: AC
  Administered 2016-12-15: 2.5 mg via RESPIRATORY_TRACT
  Filled 2016-12-15: qty 3

## 2016-12-19 ENCOUNTER — Telehealth: Payer: Self-pay | Admitting: *Deleted

## 2016-12-19 ENCOUNTER — Ambulatory Visit
Admission: RE | Admit: 2016-12-19 | Discharge: 2016-12-19 | Disposition: A | Discharge status: Home / Self Care | Payer: Medicare Other | Source: Ambulatory Visit | Attending: Internal Medicine | Admitting: Internal Medicine

## 2016-12-19 DIAGNOSIS — I2510 Atherosclerotic heart disease of native coronary artery without angina pectoris: Secondary | ICD-10-CM | POA: Diagnosis not present

## 2016-12-19 DIAGNOSIS — J8410 Pulmonary fibrosis, unspecified: Secondary | ICD-10-CM | POA: Insufficient documentation

## 2016-12-19 DIAGNOSIS — I700 Atherosclerosis of aorta: Secondary | ICD-10-CM | POA: Diagnosis not present

## 2016-12-19 DIAGNOSIS — R911 Solitary pulmonary nodule: Secondary | ICD-10-CM | POA: Diagnosis not present

## 2016-12-19 DIAGNOSIS — K760 Fatty (change of) liver, not elsewhere classified: Secondary | ICD-10-CM | POA: Diagnosis not present

## 2016-12-19 DIAGNOSIS — R918 Other nonspecific abnormal finding of lung field: Secondary | ICD-10-CM | POA: Diagnosis not present

## 2016-12-19 NOTE — Telephone Encounter (Signed)
Received a call report for this pt for his results High Res CT scan.

## 2016-12-20 NOTE — Telephone Encounter (Signed)
Called pt and explained findings. He informed me that he has a follow up appt in 2 days, so we will discuss it further then.

## 2016-12-20 NOTE — Progress Notes (Signed)
[facility]      Assessment and Plan:  Dyspnea.  -Likely due to emphysema, continue Anoro.   Pulmonary fibrosis.  -Seen on PFT, appears mild.   Lung Nodule.  --LUL 2 cm nodule, discussed need for PET scan and possible biopsy.  --Nodule appears accessible to ENB. He last smoked in 1987    Date: 12/20/2016  MRN# [id number] Primus Gritton Figueira 03-21-1933  Referring Physician:   Kevin Fenton Rivadeneira is a 83 y.o. old male seen in consultation for chief complaint of:    Chief Complaint  Patient presents with  . pulmonary fibrosis    pt here for c/u PFT/Ct    HPI:  He is currently on symbicort "every couple of days" and he was using Anoro once per day and has run out.   **Ct chest hi-res 5/21 personally reviewed; this shows fairly mild bibasilar ILD. More significantly there is a 2 cm irregular nodule in LUL, there is also mild emphysema. The nodule appears to accessible via ENB, there is minimal lymphadenopathy.   **Ratio=59% Fev=53% with reversility.  Minimal air trapping.  DLCO=92%.    desat walk 10/25/16; Baseline sat on RA at rest 97% and HR 85. After walking 360 feet sat was 95% and HR 95; mild dyspnea.    Medication:   Reviewed.    Allergies:  Clindamycin and Penicillin v potassium  Review of Systems: Gen:  Denies  fever, sweats, chills HEENT: Denies blurred vision, double vision. bleeds, sore throat Cvc:  No dizziness, chest pain. Resp:   Denies cough or sputum production,  Gi: Denies swallowing difficulty, stomach pain. Gu:  Denies bladder incontinence, burning urine Ext:   No Joint pain, stiffness. Skin: No skin rash,  hives  Endoc:  No polyuria, polydipsia. Psych: No depression, insomnia. Other:  All other systems were reviewed with the patient and were negative other that what is mentioned in the HPI.   Physical Examination:   VS: BP 138/86 (BP Location: Left Arm, Cuff Size: Normal)   Pulse 91   Resp 16   Ht 5\' 10"  (1.778  m)   Wt 206 lb (93.4 kg)   SpO2 95%   BMI 29.56 kg/m   General Appearance: No distress  Neuro:without focal findings,  speech normal,  HEENT: PERRLA, EOM intact.   Pulmonary: normal breath sounds, No wheezing.  CardiovascularNormal S1,S2.  No m/r/g.   Abdomen: Benign, Soft, non-tender. Renal:  No costovertebral tenderness  GU:  No performed at this time. Endoc: No evident thyromegaly, no signs of acromegaly. Skin:   warm, no rashes, no ecchymosis  Extremities: normal, no cyanosis, clubbing.  Other findings:    LABORATORY PANEL:   CBC No results for input(s): WBC, HGB, HCT, PLT in the last 168 hours. ------------------------------------------------------------------------------------------------------------------  Chemistries  No results for input(s): NA, K, CL, CO2, GLUCOSE, BUN, CREATININE, CALCIUM, MG, AST, ALT, ALKPHOS, BILITOT in the last 168 hours.  Invalid input(s): GFRCGP ------------------------------------------------------------------------------------------------------------------  Cardiac Enzymes No results for input(s): TROPONINI in the last 168 hours. ------------------------------------------------------------  RADIOLOGY:  Ct Chest High Resolution  Result Date: 12/19/2016 CLINICAL DATA:  Worsening chronic dyspnea. Provided history of postinflammatory pulmonary fibrosis. Remote smoking history. EXAM: CT CHEST WITHOUT CONTRAST TECHNIQUE: Multidetector CT imaging of the chest was performed following the standard protocol without intravenous contrast. High resolution imaging of the lungs, as well as inspiratory and expiratory imaging, was performed. COMPARISON:  None. FINDINGS: Cardiovascular: Normal heart size. No significant pericardial fluid/thickening. Left anterior descending, left circumflex and  right coronary atherosclerosis. Atherosclerotic nonaneurysmal thoracic aorta. Normal caliber pulmonary arteries. Mediastinum/Nodes: No discrete thyroid nodules.  Unremarkable esophagus. No pathologically enlarged axillary, mediastinal or gross hilar lymph nodes, noting limited sensitivity for the detection of hilar adenopathy on this noncontrast study. Lungs/Pleura: No pneumothorax. No pleural effusion. Partially calcified branching nodular peripheral left lower lobe opacity measuring up to 10 mm (series 3/ image 107), compatible with postinflammatory scarring. Irregular solid 2.1 x 2.0 cm anterior left upper lobe pulmonary nodule abutting the anterior and mediastinal pleura (series 3/ image 67). No acute consolidative airspace disease, lung masses or additional significant pulmonary nodules. There is mild patchy subpleural reticulation and ground-glass attenuation in both lungs, predominantly in the dependent lower lobes. No significant regions of traction bronchiectasis, architectural distortion or frank honeycombing. No significant air trapping on the expiration segments. Upper abdomen: Diffuse hepatic steatosis. Simple 2.7 cm partially exophytic anterior upper left renal cyst. Musculoskeletal: No aggressive appearing focal osseous lesions. Moderate thoracic spondylosis. IMPRESSION: 1. Indeterminate irregular solid 2.1 cm anterior left upper lobe pulmonary nodule. Primary bronchogenic carcinoma cannot be excluded, although the differential includes infection or nodular scarring. Consider one of the following in 3 months for both low-risk and high-risk individuals: (a) repeat chest CT, (b) follow-up PET-CT, or (c) tissue sampling. This recommendation follows the consensus statement: Guidelines for Management of Incidental Pulmonary Nodules Detected on CT Images: From the Fleischner Society 2017; Radiology 2017; 284:228-243. 2. Mild patchy subpleural reticulation and ground-glass attenuation predominantly in the dependent lower lobes. Findings are nonspecific and could be due to an interstitial lung disease such as mild nonspecific interstitial pneumonia (NSIP) or early  usual interstitial pneumonia (UIP). Consider a follow-up high-resolution chest CT study in 12 months to assess temporal pattern stability, as clinically warranted. 3. Aortic atherosclerosis.  Three-vessel coronary atherosclerosis. 4. Diffuse hepatic steatosis. These results will be called to the ordering clinician or representative by the Radiologist Assistant, and communication documented in the PACS or zVision Dashboard. Electronically Signed   By: Ilona Sorrel M.D.   On: 12/19/2016 11:01       Thank  you for the consultation and for allowing [facility] to assist in the care of your patient. Our recommendations are noted above.  Please contact us if we can be of further service.   Marda Stalker, MD.  Board Certified in Internal Medicine, Pulmonary Medicine, [place], and Sleep Medicine.  [facility] Office Number: [phone]  [name], M.D.  Merton Border, M.D  12/20/2016

## 2016-12-20 NOTE — Telephone Encounter (Signed)
Noted  

## 2016-12-22 ENCOUNTER — Ambulatory Visit: Payer: 59 | Admitting: Family Medicine

## 2016-12-22 ENCOUNTER — Ambulatory Visit (INDEPENDENT_AMBULATORY_CARE_PROVIDER_SITE_OTHER): Payer: Medicare Other | Admitting: Internal Medicine

## 2016-12-22 ENCOUNTER — Encounter: Payer: Self-pay | Admitting: Internal Medicine

## 2016-12-22 VITALS — BP 138/86 | HR 91 | Resp 16 | Ht 70.0 in | Wt 206.0 lb

## 2016-12-22 DIAGNOSIS — J449 Chronic obstructive pulmonary disease, unspecified: Secondary | ICD-10-CM | POA: Diagnosis not present

## 2016-12-22 DIAGNOSIS — R911 Solitary pulmonary nodule: Secondary | ICD-10-CM | POA: Diagnosis not present

## 2016-12-22 NOTE — Patient Instructions (Addendum)
--  Will send for PET scan, will follow up with you via telephone.  --Continue Anoro, STOP SYMBICORT.

## 2016-12-22 NOTE — Addendum Note (Signed)
Addended by: Oscar La R on: 12/22/2016 10:30 AM   Modules accepted: Orders

## 2016-12-29 ENCOUNTER — Ambulatory Visit
Admission: RE | Admit: 2016-12-29 | Discharge: 2016-12-29 | Disposition: A | Discharge status: Home / Self Care | Payer: Medicare Other | Source: Ambulatory Visit | Attending: Internal Medicine | Admitting: Internal Medicine

## 2016-12-29 DIAGNOSIS — R911 Solitary pulmonary nodule: Secondary | ICD-10-CM | POA: Insufficient documentation

## 2016-12-29 DIAGNOSIS — I700 Atherosclerosis of aorta: Secondary | ICD-10-CM | POA: Diagnosis not present

## 2016-12-29 DIAGNOSIS — I2510 Atherosclerotic heart disease of native coronary artery without angina pectoris: Secondary | ICD-10-CM | POA: Insufficient documentation

## 2016-12-29 LAB — GLUCOSE, CAPILLARY: Glucose-Capillary: 124 mg/dL — ABNORMAL HIGH (ref 65–99)

## 2016-12-29 MED ORDER — FLUDEOXYGLUCOSE F - 18 (FDG) INJECTION
12.6300 | Freq: Once | INTRAVENOUS | Status: AC | PRN
  Administered 2016-12-29: 12.63 via INTRAVENOUS

## 2017-01-11 ENCOUNTER — Telehealth: Payer: Self-pay | Admitting: Internal Medicine

## 2017-01-11 NOTE — Telephone Encounter (Signed)
Pt wife calling asking for Test results  She states he did xray and have not heard anything back yet   Please advise.

## 2017-01-11 NOTE — Telephone Encounter (Signed)
Spouse would like results of PET scan.

## 2017-01-18 NOTE — Telephone Encounter (Signed)
Discussed positive PET scan results with patient and wife. Explained that I am going to have Dr. Mortimer Fries take a look at that scan to see whether this would be accessible via ENB, otherwise it would go to interventional radiology.   Waunita Schooner, please take a look at the scan and let Misty or Davy Pique know whether this would be appropriate for ENB. Thanks.

## 2017-01-20 NOTE — Telephone Encounter (Signed)
Misty, can you please set up for ENB for DX of Left Lung mass.   Thank you!

## 2017-01-24 NOTE — Telephone Encounter (Signed)
Will get with DK to discuss date for procedure. Paperwork filled out to fax once date decided. Nothing further needed.

## 2017-01-26 ENCOUNTER — Ambulatory Visit (INDEPENDENT_AMBULATORY_CARE_PROVIDER_SITE_OTHER): Payer: Medicare Other | Admitting: Family Medicine

## 2017-01-26 ENCOUNTER — Encounter: Payer: Self-pay | Admitting: Family Medicine

## 2017-01-26 VITALS — BP 140/64 | HR 85 | Temp 98.4°F | Wt 212.6 lb

## 2017-01-26 DIAGNOSIS — R600 Localized edema: Secondary | ICD-10-CM | POA: Insufficient documentation

## 2017-01-26 DIAGNOSIS — I10 Essential (primary) hypertension: Secondary | ICD-10-CM | POA: Diagnosis not present

## 2017-01-26 DIAGNOSIS — R358 Other polyuria: Secondary | ICD-10-CM | POA: Diagnosis not present

## 2017-01-26 DIAGNOSIS — E119 Type 2 diabetes mellitus without complications: Secondary | ICD-10-CM | POA: Diagnosis not present

## 2017-01-26 DIAGNOSIS — T148XXA Other injury of unspecified body region, initial encounter: Secondary | ICD-10-CM | POA: Insufficient documentation

## 2017-01-26 DIAGNOSIS — R3589 Other polyuria: Secondary | ICD-10-CM | POA: Insufficient documentation

## 2017-01-26 LAB — COMPREHENSIVE METABOLIC PANEL
ALT: 16 U/L (ref 0–53)
AST: 17 U/L (ref 0–37)
Albumin: 4 g/dL (ref 3.5–5.2)
Alkaline Phosphatase: 78 U/L (ref 39–117)
BUN: 18 mg/dL (ref 6–23)
CALCIUM: 9.8 mg/dL (ref 8.4–10.5)
CHLORIDE: 100 meq/L (ref 96–112)
CO2: 32 meq/L (ref 19–32)
CREATININE: 1.16 mg/dL (ref 0.40–1.50)
GFR: 64.08 mL/min (ref >60.00)
Glucose, Bld: 98 mg/dL (ref 70–99)
POTASSIUM: 3.9 meq/L (ref 3.5–5.1)
Sodium: 140 mEq/L (ref 135–145)
Total Bilirubin: 0.5 mg/dL (ref 0.2–1.2)
Total Protein: 7.3 g/dL (ref 6.0–8.3)

## 2017-01-26 LAB — POCT URINALYSIS DIPSTICK
BILIRUBIN UA: NEGATIVE
GLUCOSE UA: NEGATIVE
Ketones, UA: NEGATIVE
NITRITE UA: NEGATIVE
Protein, UA: NEGATIVE
Spec Grav, UA: 1.02 (ref 1.010–1.025)
Urobilinogen, UA: 0.2 E.U./dL
pH, UA: 5.5 (ref 5.0–8.0)

## 2017-01-26 LAB — HEMOGLOBIN A1C: Hgb A1c MFr Bld: 6.5 % (ref 4.6–6.5)

## 2017-01-26 LAB — URINALYSIS, MICROSCOPIC ONLY

## 2017-01-26 LAB — CBC
HEMATOCRIT: 42.3 % (ref 39.0–52.0)
HEMOGLOBIN: 14 g/dL (ref 13.0–17.0)
MCHC: 33.1 g/dL (ref 30.0–36.0)
MCV: 97.4 fl (ref 78.0–100.0)
PLATELETS: 295 10*3/uL (ref 150.0–400.0)
RBC: 4.35 Mil/uL (ref 4.22–5.81)
RDW: 13.6 % (ref 11.5–15.5)
WBC: 9.4 10*3/uL (ref 4.0–10.5)

## 2017-01-26 LAB — TSH: TSH: 1.9 u[IU]/mL (ref 0.35–4.50)

## 2017-01-26 NOTE — Assessment & Plan Note (Signed)
Well-controlled for age. He'll continue his current regimen.

## 2017-01-26 NOTE — Assessment & Plan Note (Signed)
Possibly venous insufficiency. Unlikely CHF related. We'll check lab work as outlined below to rule out other causes. Advised on propping his legs up. May need to consider compression stockings.

## 2017-01-26 NOTE — Addendum Note (Signed)
Addended by: Arby Barrette on: 01/26/2017 01:39 PM   Modules accepted: Orders

## 2017-01-26 NOTE — Assessment & Plan Note (Signed)
Patient notes easy bruising for a long time. Somewhat reassuring that this is been occurring for a long time though it is a little abnormal to bruise as he describes without being on any blood thinners. We'll check a CBC to evaluate his platelets.

## 2017-01-26 NOTE — Assessment & Plan Note (Signed)
Potentially related to prostate issue as he does have a mildly enlarged prostate. He is not willing to start on medication for this currently. Potentially could be diabetes related as well. We'll check a UA. We'll check his A1c.

## 2017-01-26 NOTE — Assessment & Plan Note (Signed)
Check A1c.  Continue metformin. 

## 2017-01-26 NOTE — Patient Instructions (Signed)
Nice to see you. We will check some lab work today to evaluate for a cause of your swelling. This will also help [place] evaluate your bruising.

## 2017-01-26 NOTE — Progress Notes (Signed)
  [name], MD Phone: [phone]  Cody Bridges is a 83 y.o. male who presents today for follow-up.  Hypertension: Notes is not checking it all the time. Notes it is usually lower than today systolically. He is taking hydrochlorothiazide and lisinopril. Notes no chest pain. Notes his breathing is at his baseline and his shortness of breath is felt to be related to pulmonary causes for which he takes Anoro. Does note since we last saw him his feet and ankles have gradually been swelling. Notes it started several weeks prior to seeing [place] last. Notes they do hurt if he tries to flex or extend them. They do not go down overnight. No orthopnea. No history of heart failure.  Notes no high sugars when checking his blood sugar. He is taking metformin. Does note some polyuria. No polydipsia.  Does note some difficulty starting his urinary stream though no starting and stopping once he gets started. No straining. Does feel he empties his bladder. He would like his ostomy checked.  Patient notes he has been bruising intermittently for a long time. He is not on any blood thinners. He does not bleed very easily.  PMH: former smoker   ROS see history of present illness  Objective  Physical Exam Vitals:   01/26/17 0916  BP: 140/64  Pulse: 85  Temp: 98.4 F (36.9 C)    BP Readings from Last 3 Encounters:  01/26/17 140/64  12/22/16 138/86  10/25/16 130/80   Wt Readings from Last 3 Encounters:  01/26/17 212 lb 9.6 oz (96.4 kg)  12/22/16 206 lb (93.4 kg)  10/25/16 207 lb (93.9 kg)    Physical Exam  Constitutional: No distress.  Cardiovascular: Normal rate, regular rhythm and normal heart sounds.   Pulmonary/Chest: Effort normal and breath sounds normal.  Genitourinary:  Genitourinary Comments: Normal rectum, mildly enlarged prostate with no nodules  Musculoskeletal: He exhibits edema (trace pitting edema to mid shin bilaterally lower extremities).  Some tenderness in the areas  of swelling, no erythema or warmth  Neurological: He is alert. Gait normal.  Skin: Skin is warm and dry. He is not diaphoretic.  Scattered bruises on arms     Assessment/Plan: Please see individual problem list.  Hypertension Well-controlled for age. He'll continue his current regimen.  Diabetes mellitus without complication (HCC) Check A1c. Continue metformin.  Polyuria Potentially related to prostate issue as he does have a mildly enlarged prostate. He is not willing to start on medication for this currently. Potentially could be diabetes related as well. We'll check a UA. We'll check his A1c.  Bilateral leg edema Possibly venous insufficiency. Unlikely CHF related. We'll check lab work as outlined below to rule out other causes. Advised on propping his legs up. May need to consider compression stockings.  Bruising Patient notes easy bruising for a long time. Somewhat reassuring that this is been occurring for a long time though it is a little abnormal to bruise as he describes without being on any blood thinners. We'll check a CBC to evaluate his platelets.    Orders Placed This Encounter  Procedures  . Comp Met (CMET)  . TSH  . CBC  . HgB A1c  . POCT Urinalysis Dipstick   [name], MD [place]

## 2017-01-27 LAB — URINE CULTURE: Organism ID, Bacteria: NO GROWTH

## 2017-02-06 NOTE — Telephone Encounter (Signed)
Patient has not heard anything about scheduling enb yet. Please call patient today

## 2017-02-06 NOTE — Telephone Encounter (Signed)
Waiting on DK to give a date to schedule. Once scheduled will call pt with times and dates.

## 2017-02-06 NOTE — Telephone Encounter (Signed)
Spoke with pt and gave procedure time and date with pre-assesment time and date. Pt and wife verbalized understanding. Nothing further needed.

## 2017-02-07 ENCOUNTER — Encounter
Admission: RE | Admit: 2017-02-07 | Discharge: 2017-02-07 | Disposition: A | Discharge status: Home / Self Care | Payer: Medicare Other | Source: Ambulatory Visit | Attending: Internal Medicine | Admitting: Internal Medicine

## 2017-02-07 DIAGNOSIS — J45909 Unspecified asthma, uncomplicated: Secondary | ICD-10-CM | POA: Diagnosis not present

## 2017-02-07 DIAGNOSIS — R0602 Shortness of breath: Secondary | ICD-10-CM | POA: Diagnosis not present

## 2017-02-07 DIAGNOSIS — R600 Localized edema: Secondary | ICD-10-CM | POA: Diagnosis not present

## 2017-02-07 DIAGNOSIS — R358 Other polyuria: Secondary | ICD-10-CM | POA: Diagnosis not present

## 2017-02-07 DIAGNOSIS — E119 Type 2 diabetes mellitus without complications: Secondary | ICD-10-CM | POA: Diagnosis not present

## 2017-02-07 DIAGNOSIS — Z79899 Other long term (current) drug therapy: Secondary | ICD-10-CM | POA: Diagnosis not present

## 2017-02-07 DIAGNOSIS — I10 Essential (primary) hypertension: Secondary | ICD-10-CM | POA: Diagnosis not present

## 2017-02-07 DIAGNOSIS — R918 Other nonspecific abnormal finding of lung field: Secondary | ICD-10-CM | POA: Diagnosis not present

## 2017-02-07 DIAGNOSIS — Z888 Allergy status to other drugs, medicaments and biological substances status: Secondary | ICD-10-CM | POA: Diagnosis not present

## 2017-02-07 DIAGNOSIS — Z7984 Long term (current) use of oral hypoglycemic drugs: Secondary | ICD-10-CM | POA: Diagnosis not present

## 2017-02-07 DIAGNOSIS — Z87891 Personal history of nicotine dependence: Secondary | ICD-10-CM | POA: Diagnosis not present

## 2017-02-07 DIAGNOSIS — K219 Gastro-esophageal reflux disease without esophagitis: Secondary | ICD-10-CM | POA: Diagnosis not present

## 2017-02-07 DIAGNOSIS — N400 Enlarged prostate without lower urinary tract symptoms: Secondary | ICD-10-CM | POA: Diagnosis not present

## 2017-02-07 DIAGNOSIS — Z880 Allergy status to penicillin: Secondary | ICD-10-CM | POA: Diagnosis not present

## 2017-02-07 DIAGNOSIS — M1990 Unspecified osteoarthritis, unspecified site: Secondary | ICD-10-CM | POA: Diagnosis not present

## 2017-02-07 HISTORY — DX: Dyspnea, unspecified: R06.00

## 2017-02-07 HISTORY — DX: Gastro-esophageal reflux disease without esophagitis: K21.9

## 2017-02-07 NOTE — Patient Instructions (Signed)
Your procedure is scheduled on: February 09, 2017 (THURSDAY) Report to Same Day Surgery 2nd floor medical mall ([place] Entrance-take elevator on left to 2nd floor.  Check in with surgery information desk.) To find out your arrival time please call [phone] between 1PM - 3PM on February 08, 2017 [facility])  Remember: Instructions that are not followed completely may result in serious medical risk, up to and including death, or upon the discretion of your surgeon and anesthesiologist your surgery may need to be rescheduled.    _x___ 1. Do not eat food or drink liquids after midnight. No gum chewing or hard candies                               __x__ 2. No Alcohol for 24 hours before or after surgery.   __x__3. No Smoking for 24 prior to surgery.   ____  4. Bring all medications with you on the day of surgery if instructed.    __x__ 5. Notify your doctor if there is any change in your medical condition     (cold, fever, infections).     Do not wear jewelry, make-up, hairpins, clips or nail polish.  Do not wear lotions, powders, or perfumes. You may wear deodorant.  Do not shave 48 hours prior to surgery. Men may shave face and neck.  Do not bring valuables to the hospital.    [facility] is not responsible for any belongings or valuables.               Contacts, dentures or bridgework may not be worn into surgery.  Leave your suitcase in the car. After surgery it may be brought to your room.  For patients admitted to the hospital, discharge time is determined by your treatment team                  .   Patients discharged the day of surgery will not be allowed to drive home.  You will need someone to drive you home and stay with you the night of your procedure.    Please read over the following fact sheets that you were given:   [facility] Preparing for Surgery and or MRSA Information   _x___ Take the following medications the morning of surgery with a sip of water :  1.  LISINOPRIL  2. ROSURASTATIN  3.  4.  5.  6.  ____Fleets enema or Magnesium Citrate as directed.   ___ Use CHG Soap or sage wipes as directed on instruction sheet   _x___ Use inhalers on the day of surgery and bring to hospital day of surgery (USE SYMBICORT, PROAIR, AND ANORO INHALERS THE MORNING OF SURGERY AND [place] )    _x_ Stop Metformin and Janumet 2 days prior to surgery. (STOP METFORMIN TODAY)    ____ Take 1/2 of usual insulin dose the night before surgery and none on the morning surgery    _x___ Follow recommendations from Cardiologist, Pulmonologist or PCP regarding          stopping Aspirin, Coumadin, Plavix ,Eliquis, Effient, or Pradaxa, and Pletal.  X____Stop Anti-inflammatories such as Advil, Aleve, Ibuprofen, Motrin, Naproxen, Naprosyn, Goodies powders or aspirin products. OK to take Tylenol    _x___ Stop supplements until after surgery.  But may continue Vitamin D, Vitamin B, and multivitamin      ____ Bring C-Pap to the hospital.

## 2017-02-07 NOTE — Pre-Procedure Instructions (Signed)
Component Name Value Ref Range  Vent Rate (bpm) 89   PR Interval (msec) 144   QRS Interval (msec) 78   QT Interval (msec) 368   QTc (msec) 447   Result Narrative   Poor data quality, interpretation may be adversely affected Sinus rhythm with premature supraventricular complexes and with occasional premature ventricular complexes Nonspecific ST abnormality Abnormal ECG When compared with ECG of 05-Feb-2014 14:54, premature ventricular complexes are now present premature supraventricular complexes are now present I reviewed and concur with this report. Electronically signed IR:[email or web address] MD, Darnell Level [phone]) on 09/06/2016 12:37:00 PM  Status

## 2017-02-09 ENCOUNTER — Ambulatory Visit: Payer: Medicare Other | Admitting: Anesthesiology

## 2017-02-09 ENCOUNTER — Encounter
Admission: RE | Disposition: A | Discharge status: Home / Self Care | Payer: Self-pay | Source: Ambulatory Visit | Attending: Internal Medicine

## 2017-02-09 ENCOUNTER — Encounter: Payer: Self-pay | Admitting: *Deleted

## 2017-02-09 ENCOUNTER — Ambulatory Visit
Admission: RE | Admit: 2017-02-09 | Discharge: 2017-02-09 | Disposition: A | Discharge status: Home / Self Care | Payer: Medicare Other | Source: Ambulatory Visit | Attending: Internal Medicine | Admitting: Internal Medicine

## 2017-02-09 ENCOUNTER — Ambulatory Visit: Payer: Medicare Other

## 2017-02-09 DIAGNOSIS — J45909 Unspecified asthma, uncomplicated: Secondary | ICD-10-CM | POA: Diagnosis not present

## 2017-02-09 DIAGNOSIS — Z7984 Long term (current) use of oral hypoglycemic drugs: Secondary | ICD-10-CM | POA: Insufficient documentation

## 2017-02-09 DIAGNOSIS — E119 Type 2 diabetes mellitus without complications: Secondary | ICD-10-CM | POA: Insufficient documentation

## 2017-02-09 DIAGNOSIS — N400 Enlarged prostate without lower urinary tract symptoms: Secondary | ICD-10-CM | POA: Insufficient documentation

## 2017-02-09 DIAGNOSIS — R358 Other polyuria: Secondary | ICD-10-CM | POA: Insufficient documentation

## 2017-02-09 DIAGNOSIS — M1990 Unspecified osteoarthritis, unspecified site: Secondary | ICD-10-CM | POA: Insufficient documentation

## 2017-02-09 DIAGNOSIS — R0602 Shortness of breath: Secondary | ICD-10-CM | POA: Diagnosis not present

## 2017-02-09 DIAGNOSIS — K219 Gastro-esophageal reflux disease without esophagitis: Secondary | ICD-10-CM | POA: Insufficient documentation

## 2017-02-09 DIAGNOSIS — Z79899 Other long term (current) drug therapy: Secondary | ICD-10-CM | POA: Insufficient documentation

## 2017-02-09 DIAGNOSIS — Z888 Allergy status to other drugs, medicaments and biological substances status: Secondary | ICD-10-CM | POA: Insufficient documentation

## 2017-02-09 DIAGNOSIS — Z880 Allergy status to penicillin: Secondary | ICD-10-CM | POA: Insufficient documentation

## 2017-02-09 DIAGNOSIS — R600 Localized edema: Secondary | ICD-10-CM | POA: Insufficient documentation

## 2017-02-09 DIAGNOSIS — I10 Essential (primary) hypertension: Secondary | ICD-10-CM | POA: Diagnosis not present

## 2017-02-09 DIAGNOSIS — R911 Solitary pulmonary nodule: Secondary | ICD-10-CM

## 2017-02-09 DIAGNOSIS — Z87891 Personal history of nicotine dependence: Secondary | ICD-10-CM | POA: Insufficient documentation

## 2017-02-09 DIAGNOSIS — R918 Other nonspecific abnormal finding of lung field: Secondary | ICD-10-CM | POA: Diagnosis not present

## 2017-02-09 HISTORY — PX: ELECTROMAGNETIC NAVIGATION BROCHOSCOPY: SHX5369

## 2017-02-09 LAB — GLUCOSE, CAPILLARY: GLUCOSE-CAPILLARY: 119 mg/dL — AB (ref 65–99)

## 2017-02-09 SURGERY — ELECTROMAGNETIC NAVIGATION BRONCHOSCOPY
Anesthesia: General

## 2017-02-09 MED ORDER — LIDOCAINE HCL (CARDIAC) 20 MG/ML IV SOLN
INTRAVENOUS | Status: DC | PRN
  Administered 2017-02-09: 60 mg via INTRAVENOUS

## 2017-02-09 MED ORDER — IPRATROPIUM-ALBUTEROL 0.5-2.5 (3) MG/3ML IN SOLN
3.0000 mL | Freq: Once | RESPIRATORY_TRACT | Status: AC
  Administered 2017-02-09: 3 mL via RESPIRATORY_TRACT

## 2017-02-09 MED ORDER — IPRATROPIUM-ALBUTEROL 0.5-2.5 (3) MG/3ML IN SOLN: 3.0000 mL | Freq: Once | RESPIRATORY_TRACT | Status: DC

## 2017-02-09 MED ORDER — FENTANYL CITRATE (PF) 100 MCG/2ML IJ SOLN
INTRAMUSCULAR | Status: DC | PRN
  Administered 2017-02-09 (×2): 50 ug via INTRAVENOUS

## 2017-02-09 MED ORDER — ROCURONIUM BROMIDE 50 MG/5ML IV SOLN
INTRAVENOUS | Status: AC
  Filled 2017-02-09: qty 1

## 2017-02-09 MED ORDER — EPHEDRINE SULFATE 50 MG/ML IJ SOLN
INTRAMUSCULAR | Status: AC
  Filled 2017-02-09: qty 1

## 2017-02-09 MED ORDER — IPRATROPIUM-ALBUTEROL 0.5-2.5 (3) MG/3ML IN SOLN
RESPIRATORY_TRACT | Status: AC
  Filled 2017-02-09: qty 3

## 2017-02-09 MED ORDER — MIDAZOLAM HCL 2 MG/2ML IJ SOLN
INTRAMUSCULAR | Status: DC | PRN
  Administered 2017-02-09: 2 mg via INTRAVENOUS

## 2017-02-09 MED ORDER — SUCCINYLCHOLINE CHLORIDE 20 MG/ML IJ SOLN
INTRAMUSCULAR | Status: AC
  Filled 2017-02-09: qty 1

## 2017-02-09 MED ORDER — EPHEDRINE SULFATE 50 MG/ML IJ SOLN
INTRAMUSCULAR | Status: DC | PRN
  Administered 2017-02-09: 10 mg via INTRAVENOUS

## 2017-02-09 MED ORDER — IPRATROPIUM-ALBUTEROL 0.5-2.5 (3) MG/3ML IN SOLN: 3.0000 mL | RESPIRATORY_TRACT | Status: DC

## 2017-02-09 MED ORDER — SUGAMMADEX SODIUM 200 MG/2ML IV SOLN
INTRAVENOUS | Status: DC | PRN
  Administered 2017-02-09: 200 mg via INTRAVENOUS

## 2017-02-09 MED ORDER — PROPOFOL 10 MG/ML IV BOLUS
INTRAVENOUS | Status: DC | PRN
  Administered 2017-02-09: 150 mg via INTRAVENOUS

## 2017-02-09 MED ORDER — PHENYLEPHRINE HCL 10 MG/ML IJ SOLN
INTRAMUSCULAR | Status: DC | PRN
  Administered 2017-02-09: 200 ug via INTRAVENOUS
  Administered 2017-02-09: 100 ug via INTRAVENOUS
  Administered 2017-02-09: 200 ug via INTRAVENOUS
  Administered 2017-02-09: 100 ug via INTRAVENOUS
  Administered 2017-02-09: 200 ug via INTRAVENOUS
  Administered 2017-02-09: 100 ug via INTRAVENOUS

## 2017-02-09 MED ORDER — SODIUM CHLORIDE 0.9 % IV SOLN
INTRAVENOUS | Status: DC
  Administered 2017-02-09: 12:00:00 via INTRAVENOUS

## 2017-02-09 MED ORDER — DEXAMETHASONE SODIUM PHOSPHATE 10 MG/ML IJ SOLN
INTRAMUSCULAR | Status: AC
  Filled 2017-02-09: qty 1

## 2017-02-09 MED ORDER — ONDANSETRON HCL 4 MG/2ML IJ SOLN
INTRAMUSCULAR | Status: AC
  Filled 2017-02-09: qty 2

## 2017-02-09 MED ORDER — IPRATROPIUM-ALBUTEROL 0.5-2.5 (3) MG/3ML IN SOLN
RESPIRATORY_TRACT | Status: AC
  Administered 2017-02-09: 3 mL via RESPIRATORY_TRACT
  Filled 2017-02-09: qty 3

## 2017-02-09 MED ORDER — FENTANYL CITRATE (PF) 100 MCG/2ML IJ SOLN: 25.0000 ug | INTRAMUSCULAR | Status: DC | PRN

## 2017-02-09 MED ORDER — SUGAMMADEX SODIUM 500 MG/5ML IV SOLN: INTRAVENOUS | Status: DC | PRN

## 2017-02-09 MED ORDER — LIDOCAINE HCL 2 % IJ SOLN
INTRAMUSCULAR | Status: AC
  Filled 2017-02-09: qty 10

## 2017-02-09 MED ORDER — DEXAMETHASONE SODIUM PHOSPHATE 10 MG/ML IJ SOLN
INTRAMUSCULAR | Status: DC | PRN
  Administered 2017-02-09: 10 mg via INTRAVENOUS

## 2017-02-09 MED ORDER — PROPOFOL 10 MG/ML IV BOLUS
INTRAVENOUS | Status: AC
  Filled 2017-02-09: qty 20

## 2017-02-09 MED ORDER — PHENYLEPHRINE HCL 0.25 % NA SOLN
1.0000 | Freq: Four times a day (QID) | NASAL | Status: DC | PRN
  Filled 2017-02-09: qty 15

## 2017-02-09 MED ORDER — SUGAMMADEX SODIUM 200 MG/2ML IV SOLN
INTRAVENOUS | Status: AC
  Filled 2017-02-09: qty 2

## 2017-02-09 MED ORDER — MIDAZOLAM HCL 2 MG/2ML IJ SOLN
INTRAMUSCULAR | Status: AC
  Filled 2017-02-09: qty 2

## 2017-02-09 MED ORDER — FENTANYL CITRATE (PF) 100 MCG/2ML IJ SOLN
INTRAMUSCULAR | Status: AC
  Filled 2017-02-09: qty 2

## 2017-02-09 MED ORDER — ONDANSETRON HCL 4 MG/2ML IJ SOLN: 4.0000 mg | Freq: Once | INTRAMUSCULAR | Status: DC | PRN

## 2017-02-09 MED ORDER — FAMOTIDINE 20 MG PO TABS
20.0000 mg | ORAL_TABLET | Freq: Once | ORAL | Status: AC
  Administered 2017-02-09: 20 mg via ORAL

## 2017-02-09 MED ORDER — IPRATROPIUM-ALBUTEROL 0.5-2.5 (3) MG/3ML IN SOLN
3.0000 mL | RESPIRATORY_TRACT | Status: DC
  Administered 2017-02-09: 3 mL via RESPIRATORY_TRACT

## 2017-02-09 MED ORDER — ROCURONIUM BROMIDE 100 MG/10ML IV SOLN
INTRAVENOUS | Status: DC | PRN
  Administered 2017-02-09: 35 mg via INTRAVENOUS
  Administered 2017-02-09: 15 mg via INTRAVENOUS

## 2017-02-09 MED ORDER — METHYLPREDNISOLONE SODIUM SUCC 125 MG IJ SOLR
125.0000 mg | Freq: Once | INTRAMUSCULAR | Status: AC
  Administered 2017-02-09: 125 mg via INTRAVENOUS

## 2017-02-09 MED ORDER — SUCCINYLCHOLINE CHLORIDE 20 MG/ML IJ SOLN
INTRAMUSCULAR | Status: DC | PRN
  Administered 2017-02-09: 100 mg via INTRAVENOUS

## 2017-02-09 MED ORDER — ONDANSETRON HCL 4 MG/2ML IJ SOLN
INTRAMUSCULAR | Status: DC | PRN
  Administered 2017-02-09: 4 mg via INTRAVENOUS

## 2017-02-09 MED ORDER — FAMOTIDINE 20 MG PO TABS
ORAL_TABLET | ORAL | Status: AC
  Filled 2017-02-09: qty 1

## 2017-02-09 MED ORDER — METHYLPREDNISOLONE SODIUM SUCC 125 MG IJ SOLR
INTRAMUSCULAR | Status: AC
  Administered 2017-02-09: 125 mg via INTRAVENOUS
  Filled 2017-02-09: qty 2

## 2017-02-09 NOTE — Anesthesia Postprocedure Evaluation (Signed)
Anesthesia Post Note  Patient: Cody Bridges  Procedure(s) Performed: Procedure(s) (LRB): ELECTROMAGNETIC NAVIGATION BRONCHOSCOPY (N/A)  Patient location during evaluation: PACU Anesthesia Type: General Level of consciousness: awake and alert Pain management: pain level controlled Vital Signs Assessment: post-procedure vital signs reviewed and stable Respiratory status: spontaneous breathing, nonlabored ventilation, respiratory function stable and patient connected to nasal cannula oxygen Cardiovascular status: blood pressure returned to baseline and stable Postop Assessment: no signs of nausea or vomiting Anesthetic complications: no     Last Vitals:  Vitals:   02/09/17 1518 02/09/17 1533  BP: 108/74 (!) 119/56  Pulse: 70 71  Resp: 18 (!) 21  Temp:      Last Pain:  Vitals:   02/09/17 1533  TempSrc:   PainSc: 2                  Shaneta Cervenka S

## 2017-02-09 NOTE — Anesthesia Post-op Follow-up Note (Cosign Needed)
Anesthesia QCDR form completed.        

## 2017-02-09 NOTE — H&P (View-Only) (Signed)
  Miguelina Fore, MD Phone: [phone]  Cody Bridges is a 83 y.o. male who presents today for follow-up.  Hypertension: Notes is not checking it all the time. Notes it is usually lower than today systolically. He is taking hydrochlorothiazide and lisinopril. Notes no chest pain. Notes his breathing is at his baseline and his shortness of breath is felt to be related to pulmonary causes for which he takes Anoro. Does note since we last saw him his feet and ankles have gradually been swelling. Notes it started several weeks prior to seeing us last. Notes they do hurt if he tries to flex or extend them. They do not go down overnight. No orthopnea. No history of heart failure.  Notes no high sugars when checking his blood sugar. He is taking metformin. Does note some polyuria. No polydipsia.  Does note some difficulty starting his urinary stream though no starting and stopping once he gets started. No straining. Does feel he empties his bladder. He would like his ostomy checked.  Patient notes he has been bruising intermittently for a long time. He is not on any blood thinners. He does not bleed very easily.  PMH: former smoker   ROS see history of present illness  Objective  Physical Exam Vitals:   01/26/17 0916  BP: 140/64  Pulse: 85  Temp: 98.4 F (36.9 C)    BP Readings from Last 3 Encounters:  01/26/17 140/64  12/22/16 138/86  10/25/16 130/80   Wt Readings from Last 3 Encounters:  01/26/17 212 lb 9.6 oz (96.4 kg)  12/22/16 206 lb (93.4 kg)  10/25/16 207 lb (93.9 kg)    Physical Exam  Constitutional: No distress.  Cardiovascular: Normal rate, regular rhythm and normal heart sounds.   Pulmonary/Chest: Effort normal and breath sounds normal.  Genitourinary:  Genitourinary Comments: Normal rectum, mildly enlarged prostate with no nodules  Musculoskeletal: He exhibits edema (trace pitting edema to mid shin bilaterally lower extremities).  Some tenderness in the areas  of swelling, no erythema or warmth  Neurological: He is alert. Gait normal.  Skin: Skin is warm and dry. He is not diaphoretic.  Scattered bruises on arms     Assessment/Plan: Please see individual problem list.  Hypertension Well-controlled for age. He'll continue his current regimen.  Diabetes mellitus without complication (HCC) Check A1c. Continue metformin.  Polyuria Potentially related to prostate issue as he does have a mildly enlarged prostate. He is not willing to start on medication for this currently. Potentially could be diabetes related as well. We'll check a UA. We'll check his A1c.  Bilateral leg edema Possibly venous insufficiency. Unlikely CHF related. We'll check lab work as outlined below to rule out other causes. Advised on propping his legs up. May need to consider compression stockings.  Bruising Patient notes easy bruising for a long time. Somewhat reassuring that this is been occurring for a long time though it is a little abnormal to bruise as he describes without being on any blood thinners. We'll check a CBC to evaluate his platelets.    Orders Placed This Encounter  Procedures  . Comp Met (CMET)  . TSH  . CBC  . HgB A1c  . POCT Urinalysis Dipstick   Hedda Crumbley, MD [facility] [place]  

## 2017-02-09 NOTE — Interval H&P Note (Signed)
History and Physical Interval Note:  02/09/2017 11:52 AM  Cody Bridges  has presented today for surgery, with the diagnosis of LEFT LUNG MASS  The various methods of treatment have been discussed with the patient and family. After consideration of risks, benefits and other options for treatment, the patient has consented to  Procedure(s): ELECTROMAGNETIC NAVIGATION BRONCHOSCOPY (N/A) as a surgical intervention .  The patient's history has been reviewed, patient examined, no change in status, stable for surgery.  I have reviewed the patient's chart and labs.  Questions were answered to the patient's satisfaction.     Flora Lipps

## 2017-02-09 NOTE — Transfer of Care (Signed)
Immediate Anesthesia Transfer of Care Note  Patient: Cody Bridges  Procedure(s) Performed: Procedure(s): ELECTROMAGNETIC NAVIGATION BRONCHOSCOPY (N/A)  Patient Location: PACU  Anesthesia Type:General  Level of Consciousness: awake and alert   Airway & Oxygen Therapy: Patient Spontanous Breathing and Patient connected to face mask oxygen  Post-op Assessment: Report given to RN and Post -op Vital signs reviewed and stable  Post vital signs: Reviewed and stable  Last Vitals:  Vitals:   02/09/17 1209 02/09/17 1504  BP: 138/68 114/68  Pulse: 70 74  Resp: 18 (!) 25  Temp: (!) 36.1 C (!) 36.1 C    Last Pain:  Vitals:   02/09/17 1504  TempSrc:   PainSc: 0-No pain         Complications: No apparent anesthesia complications

## 2017-02-09 NOTE — Anesthesia Procedure Notes (Signed)
Procedure Name: Intubation Date/Time: 02/09/2017 1:43 PM Performed by: Eben Burow Pre-anesthesia Checklist: Patient identified, Emergency Drugs available, Suction available, Patient being monitored and Timeout performed Patient Re-evaluated:Patient Re-evaluated prior to induction Oxygen Delivery Method: Circle system utilized Preoxygenation: Pre-oxygenation with 100% oxygen Induction Type: IV induction Ventilation: Mask ventilation without difficulty Laryngoscope Size: Miller and 2 Grade View: Grade I Tube type: Oral Tube size: 8.5 mm Number of attempts: 1 Airway Equipment and Method: Stylet Placement Confirmation: ETT inserted through vocal cords under direct vision,  positive ETCO2 and breath sounds checked- equal and bilateral Secured at: 22 cm Tube secured with: Tape Dental Injury: Teeth and Oropharynx as per pre-operative assessment

## 2017-02-09 NOTE — Op Note (Signed)
Electromagnetic Navigation Bronchoscopy: Indication: lung mass/nodule  Preoperative Diagnosis:lung nodule/mass Post Procedure Diagnosis:lung nodule/mass Consent: Verbal/Written  The Risks and Benefits of the procedure explained to patient/family prior to start of procedure and I have discussed the risk for acute bleeding, increased chance of infection, increased chance of respiratory failure and cardiac arrest and death.  I have also explained to avoid all types of NSAIDs to decrease chance of bleeding, and to avoid food and drinks the midnight prior to procedure.  The procedure consists of a video camera with a light source to be placed and inserted  into the lungs to  look for abnormal tissue and to obtain tissue samples by using needle and biopsy tools.  The patient/family understand the risks and benefits and have agreed to proceed with procedure.   Hand washing performed prior to starting the procedure.   Type of Anesthesia: see Anesthesiology records .   Procedure Performed:  Virtual Bronchoscopy with Multi-planar Image analysis, 3-D reconstruction of coronal, sagittal and multi-planar images for the purposes of planning real-time bronchoscopy using the iLogic Electromagnetic Navigation Bronchoscopy System (superDimension).  Description of Procedure: After obtaining informed consent from the patient, the above sedative and anesthetic measures were carried out, flexible fiberoptic bronchoscope was inserted via Endotracheal tube after patient was intubated by CNA/Anesthesiologist.   The virtual camera was then placed into the central portion of the trachea. The trachea itself was inspected.  The main carina, right and left midstem bronchus and all the segmental and subsegmental airways by virtual bronchoscopy were inspected. The camera was directed to standard registration points at the following centers: main carina, right upper lobe bronchus, right lower lobe bronchus, right middle  lobe bronchus, left upper lobe bronchus, and the left lower lobe bronchus. This data was transferred to the i-Logic ENB system for real-time bronchoscopy.   The scope was then navigated to the LUL  for tissue sampling however, this was a difficult case as the bronchoscope light source malfunctioned and I was not able to visualize the whole airway, also there was some mucosal edema and erythema with mild bleeding that would hinder my view. I tried to obtain some samples     Specimans Obtained:  Transbronchial Fine Needle Aspirations 21G times:3    Fluoroscopy:  Fluoroscopy was utilized during the course of this procedure to assure that biopsies were taken in a safe manner under fluoroscopic guidance with spot films required.   Complications:None  Estimated Blood Loss: minimal approx 1cc  Monitoring:  The patient was monitored with continuous oximetry and received supplemental nasal cannula oxygen throughout the procedure. In addition, serial blood pressure measurements and continuous electrocardiography showed these physiologic parameters to remain tolerable throughout the procedure.   Assessment and Plan/Additional Comments: Follow up Pathology Reports Recommend CT guided biopsy of LUL mass    Cody Bridges Cody Bridges, M.D.  [facility]  Medical Director [place] Director [place] Department

## 2017-02-09 NOTE — Anesthesia Preprocedure Evaluation (Addendum)
Anesthesia Evaluation  Patient identified by MRN, date of birth, ID band Patient awake    Reviewed: Allergy & Precautions, NPO status , Patient's Chart, lab work & pertinent test results, reviewed documented beta blocker date and time   Airway Mallampati: III  TM Distance: >3 FB     Dental  (+) Chipped, Partial Lower, Partial Upper   Pulmonary shortness of breath, asthma , former smoker,           Cardiovascular hypertension, Pt. on medications      Neuro/Psych    GI/Hepatic GERD  Controlled,  Endo/Other  diabetes, Type 2  Renal/GU      Musculoskeletal  (+) Arthritis ,   Abdominal   Peds  Hematology   Anesthesia Other Findings   Reproductive/Obstetrics                            Anesthesia Physical Anesthesia Plan  ASA: III  Anesthesia Plan: General   Post-op Pain Management:    Induction: Intravenous  PONV Risk Score and Plan:   Airway Management Planned: Oral ETT  Additional Equipment:   Intra-op Plan:   Post-operative Plan:   Informed Consent: I have reviewed the patients History and Physical, chart, labs and discussed the procedure including the risks, benefits and alternatives for the proposed anesthesia with the patient or authorized representative who has indicated his/her understanding and acceptance.     Plan Discussed with: CRNA  Anesthesia Plan Comments:         Anesthesia Quick Evaluation

## 2017-02-09 NOTE — Discharge Instructions (Signed)
Flexible Bronchoscopy, Care After These instructions give you information on caring for yourself after your procedure. Your doctor may also give you more specific instructions. Call your doctor if you have any problems or questions after your procedure. Follow these instructions at home:  Do not eat or drink anything for 2 hours after your procedure. If you try to eat or drink before the medicine wears off, food or drink could go into your lungs. You could also burn yourself.  After 2 hours have passed and when you can cough and gag normally, you may eat soft food and drink liquids slowly.  The day after the test, you may eat your normal diet.  You may do your normal activities.  Keep all doctor visits. Get help right away if:  You get more and more short of breath.  You get light-headed.  You feel like you are going to pass out (faint).  You have chest pain.  You have new problems that worry you.  You cough up more than a little blood.  You cough up more blood than before. This information is not intended to replace advice given to you by your health care provider. Make sure you discuss any questions you have with your health care provider. Document Released: 05/15/2009 Document Revised: 12/24/2015 Document Reviewed: 03/22/2013 Elsevier Interactive Patient Education  2017 [employer].

## 2017-02-10 ENCOUNTER — Encounter: Payer: Self-pay | Admitting: Internal Medicine

## 2017-02-10 LAB — CYTOLOGY - NON PAP

## 2017-02-13 ENCOUNTER — Telehealth: Payer: Self-pay | Admitting: *Deleted

## 2017-02-13 DIAGNOSIS — R918 Other nonspecific abnormal finding of lung field: Secondary | ICD-10-CM

## 2017-02-13 NOTE — Telephone Encounter (Signed)
Pt is requesting results of his biopsy from last week. Please advise.

## 2017-02-13 NOTE — Telephone Encounter (Signed)
Called patient and told him results Please place order for CT guided Biopsy of LUL lung mass Patient made aware of plan of action

## 2017-02-13 NOTE — Telephone Encounter (Signed)
Orders entered.[place]

## 2017-02-14 DIAGNOSIS — I10 Essential (primary) hypertension: Secondary | ICD-10-CM | POA: Diagnosis not present

## 2017-02-14 DIAGNOSIS — E782 Mixed hyperlipidemia: Secondary | ICD-10-CM | POA: Diagnosis not present

## 2017-02-14 DIAGNOSIS — R0602 Shortness of breath: Secondary | ICD-10-CM | POA: Diagnosis not present

## 2017-02-16 DIAGNOSIS — B351 Tinea unguium: Secondary | ICD-10-CM | POA: Diagnosis not present

## 2017-02-16 DIAGNOSIS — M79671 Pain in right foot: Secondary | ICD-10-CM | POA: Diagnosis not present

## 2017-02-16 DIAGNOSIS — M79672 Pain in left foot: Secondary | ICD-10-CM | POA: Diagnosis not present

## 2017-02-17 ENCOUNTER — Other Ambulatory Visit: Payer: Self-pay | Admitting: Radiology

## 2017-02-20 DIAGNOSIS — E782 Mixed hyperlipidemia: Secondary | ICD-10-CM | POA: Diagnosis not present

## 2017-02-20 DIAGNOSIS — I10 Essential (primary) hypertension: Secondary | ICD-10-CM | POA: Diagnosis not present

## 2017-02-20 DIAGNOSIS — R062 Wheezing: Secondary | ICD-10-CM | POA: Diagnosis not present

## 2017-02-20 DIAGNOSIS — R0602 Shortness of breath: Secondary | ICD-10-CM | POA: Diagnosis not present

## 2017-02-21 ENCOUNTER — Other Ambulatory Visit: Payer: Self-pay | Admitting: Radiology

## 2017-02-22 ENCOUNTER — Other Ambulatory Visit: Payer: Self-pay | Admitting: Internal Medicine

## 2017-02-22 ENCOUNTER — Ambulatory Visit
Admission: RE | Admit: 2017-02-22 | Discharge: 2017-02-22 | Disposition: A | Discharge status: Home / Self Care | Payer: Medicare Other | Source: Ambulatory Visit | Attending: Internal Medicine | Admitting: Internal Medicine

## 2017-02-22 DIAGNOSIS — Z87891 Personal history of nicotine dependence: Secondary | ICD-10-CM | POA: Diagnosis not present

## 2017-02-22 DIAGNOSIS — E785 Hyperlipidemia, unspecified: Secondary | ICD-10-CM | POA: Insufficient documentation

## 2017-02-22 DIAGNOSIS — J45909 Unspecified asthma, uncomplicated: Secondary | ICD-10-CM | POA: Diagnosis not present

## 2017-02-22 DIAGNOSIS — M1990 Unspecified osteoarthritis, unspecified site: Secondary | ICD-10-CM | POA: Insufficient documentation

## 2017-02-22 DIAGNOSIS — I10 Essential (primary) hypertension: Secondary | ICD-10-CM | POA: Insufficient documentation

## 2017-02-22 DIAGNOSIS — Z7984 Long term (current) use of oral hypoglycemic drugs: Secondary | ICD-10-CM | POA: Insufficient documentation

## 2017-02-22 DIAGNOSIS — E119 Type 2 diabetes mellitus without complications: Secondary | ICD-10-CM | POA: Insufficient documentation

## 2017-02-22 DIAGNOSIS — R911 Solitary pulmonary nodule: Secondary | ICD-10-CM | POA: Insufficient documentation

## 2017-02-22 DIAGNOSIS — K219 Gastro-esophageal reflux disease without esophagitis: Secondary | ICD-10-CM | POA: Insufficient documentation

## 2017-02-22 DIAGNOSIS — R918 Other nonspecific abnormal finding of lung field: Secondary | ICD-10-CM

## 2017-02-22 LAB — CBC
HCT: 40.4 % (ref 40.0–52.0)
Hemoglobin: 13.4 g/dL (ref 13.0–18.0)
MCH: 31.6 pg (ref 26.0–34.0)
MCHC: 33.3 g/dL (ref 32.0–36.0)
MCV: 94.8 fL (ref 80.0–100.0)
PLATELETS: 274 10*3/uL (ref 150–440)
RBC: 4.26 MIL/uL — ABNORMAL LOW (ref 4.40–5.90)
RDW: 13.3 % (ref 11.5–14.5)
WBC: 8.4 10*3/uL (ref 3.8–10.6)

## 2017-02-22 LAB — PROTIME-INR
INR: 0.93
Prothrombin Time: 12.5 seconds (ref 11.4–15.2)

## 2017-02-22 LAB — APTT: APTT: 31 s (ref 24–36)

## 2017-02-22 MED ORDER — SODIUM CHLORIDE 0.9 % IV SOLN
INTRAVENOUS | Status: DC
  Administered 2017-02-22: 08:00:00 via INTRAVENOUS

## 2017-02-22 MED ORDER — FENTANYL CITRATE (PF) 100 MCG/2ML IJ SOLN
INTRAMUSCULAR | Status: AC
  Filled 2017-02-22: qty 2

## 2017-02-22 MED ORDER — MIDAZOLAM HCL 5 MG/5ML IJ SOLN
INTRAMUSCULAR | Status: AC
  Filled 2017-02-22: qty 5

## 2017-02-22 NOTE — Sedation Documentation (Signed)
Procedure cancelled.  See MD note. 

## 2017-02-22 NOTE — Progress Notes (Signed)
Interventional Radiology Progress Note    Scout CT of the chest for planning of LUL nodule biopsy performed, with the nodule clearly measuring smaller diameter than both the CT from 5/21 and the PET CT from 5/31.    Discussed with Dr. Mortimer Fries.  Agree that there is high likelihood of inflammation/infection as the etiology given the changes.    Will defer the biopsy for now for clinic follow up and interval CT imaging.   Patient has been updated.    DC home now.   Signed,  Dulcy Fanny. Earleen Newport, DO

## 2017-02-22 NOTE — Consult Note (Signed)
Chief Complaint: LUL nodule  Referring Physician(s): Kasa,Kurian  Supervising Physician: Corrie Mckusick  Patient Status: [facility] - Out-pt  History of Present Illness: Cody Bridges is a 83 y.o. male presenting today for a LUL nodule biopsy.    He has had a prior negative bronch.  Nodule is suspicious on his work-up imaging.   Past Medical History:  Diagnosis Date  . Arthritis   . Asthma   . Diabetes mellitus without complication ([place])     9357  . Dyspnea    with exertion  . GERD (gastroesophageal reflux disease)   . Hyperlipidemia   . Hypertension     Past Surgical History:  Procedure Laterality Date  . BUNIONECTOMY Right   . COLONOSCOPY  2016  . ELECTROMAGNETIC NAVIGATION BROCHOSCOPY N/A 02/09/2017   Procedure: ELECTROMAGNETIC NAVIGATION BRONCHOSCOPY;  Surgeon: Flora Lipps, MD;  Location: [facility] ORS;  Service: Cardiopulmonary;  Laterality: N/A;  . EYE SURGERY Right    Cataract Extraction with IOL  . HAMMER TOE SURGERY Right   . TONSILLECTOMY  1938    Allergies: Clindamycin and Penicillin v potassium  Medications: Prior to Admission medications   Medication Sig Start Date End Date Taking? Authorizing Provider  calcium carbonate (TUMS - DOSED IN MG ELEMENTAL CALCIUM) 500 MG chewable tablet Chew 1-2 tablets by mouth 3 (three) times daily as needed (for acid reflux/indigestion.).   Yes [provider]  Cholecalciferol (VITAMIN D-3) 5000 units TABS Take 5,000 Units by mouth daily.   Yes [provider]  hydrochlorothiazide (HYDRODIURIL) 25 MG tablet Take 25 mg by mouth daily.   Yes [provider]  lisinopril (PRINIVIL,ZESTRIL) 5 MG tablet Take 5 mg by mouth daily. 08/20/16  Yes [provider]  metFORMIN (GLUCOPHAGE) 500 MG tablet Take 500 mg by mouth 2 (two) times daily. 06/25/14  Yes [provider]  Multiple Vitamin (MULTIVITAMIN WITH MINERALS) TABS tablet Take 1 tablet by mouth daily.   Yes [provider]    PROAIR HFA 108 (90 Base) MCG/ACT inhaler Inhale 1-2 puffs into the lungs every 6 (six) hours as needed (for wheezing/shortness of breath).  08/30/16  Yes [provider]  rosuvastatin (CRESTOR) 20 MG tablet Take 20 mg by mouth daily.   Yes [provider]  SYMBICORT 160-4.5 MCG/ACT inhaler Inhale 2 puffs into the lungs 2 (two) times daily. 09/20/16  Yes Leone Haven, MD  triamcinolone ointment (KENALOG) 0.1 % Apply 1 application topically 2 (two) times daily as needed (for dry skin/irritation.).   Yes [provider]  umeclidinium-vilanterol (ANORO ELLIPTA) 62.5-25 MCG/INH AEPB Inhale 1 puff into the lungs daily. 10/25/16  Yes Laverle Hobby, MD  ACCU-CHEK AVIVA test strip TEST EVERY DAY 08/30/16   [provider]  ACCU-CHEK SOFTCLIX LANCETS lancets daily. 08/22/16   [provider]     Family History  Problem Relation Age of Onset  . Cancer Mother   . Stomach cancer Mother   . Stomach cancer Sister     Social History   Social History  . Marital status: Widowed    Spouse name: N/A  . Number of children: N/A  . Years of education: N/A   Social History Main Topics  . Smoking status: Former Smoker    Packs/day: 1.00    Types: Cigarettes    Quit date: 10/30/1985  . Smokeless tobacco: Never Used  . Alcohol use Yes     Comment: occasional  . Drug use: No  . Sexual activity: Not Asked  Other Topics Concern  . None   Social History Narrative  . None    ECOG Status: 0 - Asymptomatic  Review of Systems: A 12 point ROS discussed and pertinent positives are indicated in the HPI above.  All other systems are negative.  Review of Systems  Vital Signs: BP 117/67   Pulse 61   Temp 98.7 F (37.1 C) (Oral)   Resp 12   SpO2 98%   Physical Exam General: 83 yo male appearing   stated age.  Well-developed, well-nourished.  No distress. HEENT: Atraumatic, normocephalic.  Conjugate gaze, extra-ocular motor intact. No scleral  icterus or scleral injection. No lesions on external ears, nose, lips, or gums.  Oral mucosa moist, pink.  Neck: Symmetric with no goiter enlargement.  Chest/Lungs:  Symmetric chest with inspiration/expiration.  No labored breathing.  Clear to auscultation with no wheezes, rhonchi, or rales.  Heart:  RRR, with no third heart sounds appreciated. No JVD appreciated.  Abdomen:  Soft, NT/ND, with + bowel sounds.   Genito-urinary: Deferred Neurologic: Alert & Oriented to person, place, and time.   Normal affect and insight.  Appropriate questions.  Moving all 4 extremities with gross sensory intact.     Mallampati Score:  2  Imaging: Dg C-arm 1-60 Min-no Report  Result Date: 02/09/2017 Fluoroscopy was utilized by the requesting physician.  No radiographic interpretation.    Labs:  CBC:  Recent Labs  01/26/17 0944 02/22/17 0728  WBC 9.4 8.4  HGB 14.0 13.4  HCT 42.3 40.4  PLT 295.0 274    COAGS:  Recent Labs  02/22/17 0728  INR 0.93  APTT 31    BMP:  Recent Labs  09/30/16 1506 10/24/16 1632 01/26/17 0944  NA  --  140 140  K  --  4.1 3.9  CL  --  101 100  CO2  --  31 32  GLUCOSE  --  82 98  BUN  --  21 18  CALCIUM  --  9.7 9.8  CREATININE 1.10 1.09 1.16    LIVER FUNCTION TESTS:  Recent Labs  10/24/16 1632 01/26/17 0944  BILITOT 0.3 0.5  AST 18 17  ALT 23 16  ALKPHOS 69 78  PROT 7.5 7.3  ALBUMIN 3.9 4.0    TUMOR MARKERS: No results for input(s): AFPTM, CEA, CA199, CHROMGRNA in the last 8760 hours.  Assessment and Plan:  83 yo male with suspicious LUL nodule, presenting for biopsy.    Risks and Benefits discussed with the patient including, but not limited to bleeding, hemoptysis, respiratory failure requiring intubation, infection, pneumothorax requiring chest tube placement, stroke from air embolism or even death. All of the patient's questions were answered, patient is agreeable to proceed. Consent signed and in chart.  Electronically  Signed: Corrie Mckusick, DO 02/22/2017, 9:35 AM   I spent a total of  15 Minutes   in face to face in clinical consultation, greater than 50% of which was counseling/coordinating care for LUL nodule biopsy, possible chest tube.

## 2017-03-02 ENCOUNTER — Telehealth: Payer: Self-pay | Admitting: Internal Medicine

## 2017-03-02 NOTE — Telephone Encounter (Signed)
Please advise on message below.

## 2017-03-02 NOTE — Telephone Encounter (Signed)
Noted  

## 2017-03-02 NOTE — Telephone Encounter (Signed)
Discussed results with pt and wife via telephone. Reassured that the nodule was shrinking and that this is very unlikely to be malignant, and no medications or further biopsies are necessary at this time. He has a fu appt on 8/20, and we could discuss the possibility of a repeat CT at that time.

## 2017-03-02 NOTE — Telephone Encounter (Signed)
Pt calling stating he would like the results on his CT scans/ he was told that there were spots they saw during the scan He is very worried.  He states it has been now a week since he's did all the exams  Please advise

## 2017-03-17 NOTE — Progress Notes (Signed)
[facility] Consultation      Assessment and Plan:  Dyspnea.  -Likely due to emphysema, continue Anoro.   Pulmonary fibrosis.  -Seen on PFT, appears mild.   Lung Nodule.  --LUL shrinking nodule, likely inflammatory.  --PET scan 12/29/2016, 1.7 cm left upper lobe nodule SUV equals 3.3>> ENB 02/09/2017 negative>> repeat CT 02/22/2017 decrease in size nodule to 8 mm, needle biopsy deferred    Date: 03/17/2017  MRN# [id number] Cody Bridges 1933-02-15  Referring Physician:   Kevin Fenton Bridges is a 83 y.o. old male seen in consultation for chief complaint of:    Chief Complaint  Patient presents with  . Lung Lesion    HPI:  The patient is an 83 year old male with emphysema. Last visit it was noted that he had a 1.7 cm left upper lobe nodule. He was referred for an ENB bronchoscopy, which was negative on 02/09/2017. Subsequently, the patient was sent for a needle biopsy, CT-guided, the left upper lobe nodule. However, on the scans at that time he was noted that the nodule had decreased in size to 8 mm, therefore, the biopsy was deferred.  He has continued to be dyspneic with mild activity. He is using anoro once daily, he is also using symbicort once or twice daily. He can walk groceries in from the car but then stop and rest. He can walk a grocery store, but sometimes will get out of breath. He has not been referred to pulmonary rehab.   **Ct chest hi-res 12/19/16; this shows fairly mild bibasilar ILD. More significantly there is a 2 cm irregular nodule in LUL, there is also mild emphysema.   *PFT *Ratio=59% Fev=53% with reversility.  Minimal air trapping.  DLCO=92%.    desat walk 10/25/16; Baseline sat on RA at rest 97% and HR 85. After walking 360 feet sat was 95% and HR 95; mild dyspnea.    Medication:   Reviewed.    Allergies:  Clindamycin and Penicillin v potassium  Review of Systems: Gen:  Denies  fever, sweats, chills HEENT: Denies blurred  vision, double vision. bleeds, sore throat Cvc:  No dizziness, chest pain. Resp:   Denies cough or sputum production,  Gi: Denies swallowing difficulty, stomach pain. Gu:  Denies bladder incontinence, burning urine Ext:   No Joint pain, stiffness. Skin: No skin rash,  hives  Endoc:  No polyuria, polydipsia. Psych: No depression, insomnia. Other:  All other systems were reviewed with the patient and were negative other that what is mentioned in the HPI.   Physical Examination:   VS: BP (!) 150/88 (BP Location: Left Arm, Cuff Size: Normal)   Pulse 98   Resp 16   Ht 5\' 10"  (1.778 m)   Wt 216 lb (98 kg)   SpO2 95%   BMI 30.99 kg/m   General Appearance: No distress  Neuro:without focal findings,  speech normal,  HEENT: PERRLA, EOM intact.   Pulmonary: normal breath sounds, No wheezing. Decreased air entry.  CardiovascularNormal S1,S2.  No m/r/g.   Abdomen: Benign, Soft, non-tender. Renal:  No costovertebral tenderness  GU:  No performed at this time. Endoc: No evident thyromegaly, no signs of acromegaly. Skin:   warm, no rashes, no ecchymosis  Extremities: normal, no cyanosis, clubbing.  Other findings:    LABORATORY PANEL:   CBC No results for input(s): WBC, HGB, HCT, PLT in the last 168 hours. ------------------------------------------------------------------------------------------------------------------  Chemistries  No results for input(s): NA, K, CL, CO2, GLUCOSE, BUN, CREATININE, CALCIUM, MG,  AST, ALT, ALKPHOS, BILITOT in the last 168 hours.  Invalid input(s): GFRCGP ------------------------------------------------------------------------------------------------------------------  Cardiac Enzymes No results for input(s): TROPONINI in the last 168 hours. ------------------------------------------------------------  RADIOLOGY:  No results found.     Thank  you for the consultation and for allowing [facility] to assist in the care  of your patient. Our recommendations are noted above.  Please contact us if we can be of further service.   Cody Stalker, MD.  Board Certified in Internal Medicine, Pulmonary Medicine, [place], and Sleep Medicine.  [facility] Office Number: [phone]  Cody Bridges, M.D.  Cody Bridges, M.D  03/17/2017

## 2017-03-20 ENCOUNTER — Encounter: Payer: Self-pay | Admitting: Internal Medicine

## 2017-03-20 ENCOUNTER — Ambulatory Visit (INDEPENDENT_AMBULATORY_CARE_PROVIDER_SITE_OTHER): Payer: Medicare Other | Admitting: Internal Medicine

## 2017-03-20 VITALS — BP 150/88 | HR 98 | Resp 16 | Ht 70.0 in | Wt 216.0 lb

## 2017-03-20 DIAGNOSIS — J439 Emphysema, unspecified: Secondary | ICD-10-CM

## 2017-03-20 DIAGNOSIS — IMO0001 Reserved for inherently not codable concepts without codable children: Secondary | ICD-10-CM

## 2017-03-20 DIAGNOSIS — R911 Solitary pulmonary nodule: Secondary | ICD-10-CM

## 2017-03-20 DIAGNOSIS — J449 Chronic obstructive pulmonary disease, unspecified: Secondary | ICD-10-CM | POA: Diagnosis not present

## 2017-03-20 MED ORDER — FLUTICASONE-UMECLIDIN-VILANT 100-62.5-25 MCG/INH IN AEPB: 1.0000 | INHALATION_SPRAY | Freq: Every day | RESPIRATORY_TRACT | 5 refills | Status: DC

## 2017-03-20 NOTE — Patient Instructions (Addendum)
--  Increase your physical activity level, will refer to pulm rehab.   --Will change to Trelegy inhaler, stop Anoro, symbicort. Continue using rescue inhaler when needed.   --Will repeat Ct chest in 6 months and follow up after that.

## 2017-03-28 ENCOUNTER — Encounter: Payer: Self-pay | Admitting: *Deleted

## 2017-03-28 ENCOUNTER — Encounter: Payer: Medicare Other | Attending: Internal Medicine | Admitting: *Deleted

## 2017-03-28 VITALS — Ht 70.5 in | Wt 211.3 lb

## 2017-03-28 DIAGNOSIS — Z87891 Personal history of nicotine dependence: Secondary | ICD-10-CM | POA: Diagnosis not present

## 2017-03-28 DIAGNOSIS — I10 Essential (primary) hypertension: Secondary | ICD-10-CM | POA: Diagnosis not present

## 2017-03-28 DIAGNOSIS — Z7984 Long term (current) use of oral hypoglycemic drugs: Secondary | ICD-10-CM | POA: Insufficient documentation

## 2017-03-28 DIAGNOSIS — Z79899 Other long term (current) drug therapy: Secondary | ICD-10-CM | POA: Diagnosis not present

## 2017-03-28 DIAGNOSIS — E119 Type 2 diabetes mellitus without complications: Secondary | ICD-10-CM | POA: Insufficient documentation

## 2017-03-28 DIAGNOSIS — J449 Chronic obstructive pulmonary disease, unspecified: Secondary | ICD-10-CM | POA: Insufficient documentation

## 2017-03-28 NOTE — Patient Instructions (Signed)
Patient Instructions  Patient Details  Name: Cody Bridges MRN: [id number] Date of Birth: 1933/07/26 Referring Provider:  Laverle Hobby, *  Below are the personal goals you chose as well as exercise and nutrition goals. Our goal is to help you keep on track towards obtaining and maintaining your goals. We will be discussing your progress on these goals with you throughout the program.  Initial Exercise Prescription:     Initial Exercise Prescription - 03/28/17 1300      Date of Initial Exercise RX and Referring Provider   Date 03/28/17     Treadmill   MPH 1.5   Grade 0   Minutes 15   METs 2.15     Recumbant Bike   Level 1   RPM 60   Watts 5   Minutes 15   METs 1.8     NuStep   Level 2   SPM 80   Minutes 15   METs 2     Arm Ergometer   Level 1   RPM 50   Minutes 15   METs 1.6     Prescription Details   Frequency (times per week) 3   Duration Progress to 45 minutes of aerobic exercise without signs/symptoms of physical distress     Intensity   THRR 40-80% of Max Heartrate 112-129   Ratings of Perceived Exertion 11-15   Perceived Dyspnea 0-4     Progression   Progression Continue to progress workloads to maintain intensity without signs/symptoms of physical distress.     Resistance Training   Training Prescription Yes   Weight 3   Reps 10-15      Exercise Goals: Frequency: Be able to perform aerobic exercise three times per week working toward 3-5 days per week.  Intensity: Work with a perceived exertion of 11 (fairly light) - 15 (hard) as tolerated. Follow your new exercise prescription and watch for changes in prescription as you progress with the program. Changes will be reviewed with you when they are made.  Duration: You should be able to do 30 minutes of continuous aerobic exercise in addition to a 5 minute warm-up and a 5 minute cool-down routine.  Nutrition Goals: Your personal nutrition goals will be established when you do your  nutrition analysis with the dietician.  The following are nutrition guidelines to follow: Cholesterol < 200mg /day Sodium < 1500mg /day Fiber: Men over 50 yrs - 30 grams per day  Personal Goals:     Personal Goals and Risk Factors at Admission - 03/28/17 1338      Core Components/Risk Factors/Patient Goals on Admission   Improve shortness of breath with ADL's Yes   Intervention Provide education, individualized exercise plan and daily activity instruction to help decrease symptoms of SOB with activities of daily living.   Expected Outcomes Short Term: Achieves a reduction of symptoms when performing activities of daily living.  Long term goal: Improved ability to perform ADL's with less SOB   Develop more efficient breathing techniques such as purse lipped breathing and diaphragmatic breathing; and practicing self-pacing with activity Yes   Intervention Provide education, demonstration and support about specific breathing techniuqes utilized for more efficient breathing. Include techniques such as pursed lipped breathing, diaphragmatic breathing and self-pacing activity.   Expected Outcomes Short Term: Participant will be able to demonstrate and use breathing techniques as needed throughout daily activities.  Long term goal: Continued use of techniques learned to improve efficiency of breathing   Increase knowledge of respiratory medications and ability  to use respiratory devices properly  Yes   Intervention Provide education and demonstration as needed of appropriate use of medications, inhalers, and oxygen therapy.   Expected Outcomes Short Term: Achieves understanding of medications use. Understands that oxygen is a medication prescribed by physician. Demonstrates appropriate use of inhaler and oxygen therapy.  Long Term Goal: Maintains ability to utilize medications, inhalers and oxygen therapy   Diabetes Yes   Intervention Provide education about signs/symptoms and action to take for  hypo/hyperglycemia.;Provide education about proper nutrition, including hydration, and aerobic/resistive exercise prescription along with prescribed medications to achieve blood glucose in normal ranges: Fasting glucose 65-99 mg/dL   Expected Outcomes Short Term: Participant verbalizes understanding of the signs/symptoms and immediate care of hyper/hypoglycemia, proper foot care and importance of medication, aerobic/resistive exercise and nutrition plan for blood glucose control.;Long Term: Attainment of HbA1C < 7%.   Hypertension Yes   Intervention Provide education on lifestyle modifcations including regular physical activity/exercise, weight management, moderate sodium restriction and increased consumption of fresh fruit, vegetables, and low fat dairy, alcohol moderation, and smoking cessation.;Monitor prescription use compliance.   Expected Outcomes Short Term: Continued assessment and intervention until BP is < 140/37mm HG in hypertensive participants. < 130/43mm HG in hypertensive participants with diabetes, heart failure or chronic kidney disease.;Long Term: Maintenance of blood pressure at goal levels.   Lipids Yes   Intervention Provide education and support for participant on nutrition & aerobic/resistive exercise along with prescribed medications to achieve LDL 70mg , HDL >40mg .   Expected Outcomes Short Term: Participant states understanding of desired cholesterol values and is compliant with medications prescribed. Participant is following exercise prescription and nutrition guidelines.;Long Term: Cholesterol controlled with medications as prescribed, with individualized exercise RX and with personalized nutrition plan. Value goals: LDL < 70mg , HDL > 40 mg.      Tobacco Use Initial Evaluation: History  Smoking Status  . Former Smoker  . Packs/day: 2.00  . Years: 35.00  . Types: Cigarettes  . Quit date: 10/30/1985  Smokeless Tobacco  . Never Used    Exercise Goals and Review:      Exercise Goals    Row Name 03/28/17 1336             Exercise Goals   Increase Physical Activity Yes       Intervention Provide advice, education, support and counseling about physical activity/exercise needs.;Develop an individualized exercise prescription for aerobic and resistive training based on initial evaluation findings, risk stratification, comorbidities and participant's personal goals.       Expected Outcomes Achievement of increased cardiorespiratory fitness and enhanced flexibility, muscular endurance and strength shown through measurements of functional capacity and personal statement of participant.       Increase Strength and Stamina Yes       Intervention Provide advice, education, support and counseling about physical activity/exercise needs.;Develop an individualized exercise prescription for aerobic and resistive training based on initial evaluation findings, risk stratification, comorbidities and participant's personal goals.       Expected Outcomes Achievement of increased cardiorespiratory fitness and enhanced flexibility, muscular endurance and strength shown through measurements of functional capacity and personal statement of participant.       Able to understand and use rate of perceived exertion (RPE) scale Yes       Intervention Provide education and explanation on how to use RPE scale       Expected Outcomes Short Term: Able to use RPE daily in rehab to express subjective intensity level;Long Term:  Able  to use RPE to guide intensity level when exercising independently       Able to understand and use Dyspnea scale Yes       Intervention Provide education and explanation on how to use Dyspnea scale       Expected Outcomes Short Term: Able to use Dyspnea scale daily in rehab to express subjective sense of shortness of breath during exertion;Long Term: Able to use Dyspnea scale to guide intensity level when exercising independently       Knowledge and understanding  of Target Heart Rate Range (THRR) Yes       Intervention Provide education and explanation of THRR including how the numbers were predicted and where they are located for reference       Expected Outcomes Short Term: Able to state/look up THRR;Long Term: Able to use THRR to govern intensity when exercising independently;Short Term: Able to use daily as guideline for intensity in rehab       Able to check pulse independently Yes       Intervention Provide education and demonstration on how to check pulse in carotid and radial arteries.;Review the importance of being able to check your own pulse for safety during independent exercise       Expected Outcomes Short Term: Able to explain why pulse checking is important during independent exercise;Long Term: Able to check pulse independently and accurately       Understanding of Exercise Prescription Yes       Intervention Provide education, explanation, and written materials on patient's individual exercise prescription       Expected Outcomes Short Term: Able to explain program exercise prescription;Long Term: Able to explain home exercise prescription to exercise independently          Copy of goals given to participant.

## 2017-03-28 NOTE — Progress Notes (Signed)
Daily Session Note  Patient Details  Name: Cody Bridges MRN: [id number] Date of Birth: 10-03-1932 Referring Provider:    Encounter Date: 03/28/2017  Check In:     Session Check In - 03/28/17 1227      Check-In   Location [facility]   Supervising physician immediately available to respond to emergencies [facility] immediately available ER MD   Physician(s) Drs: Jimmye Norman and Kinner     Pain Assessment   Currently in Pain? No/denies         History  Smoking Status  . Former Smoker  . Packs/day: 2.00  . Years: 35.00  . Types: Cigarettes  . Quit date: 10/30/1985  Smokeless Tobacco  . Never Used    Goals Met:  Exercise tolerated well Personal goals reviewed  Goals Unmet:  Not Applicable  Comments: Medical review completed today   Dr. Emily Filbert is Medical Director for [facility] and [facility].

## 2017-03-28 NOTE — Progress Notes (Signed)
Pulmonary Individual Treatment Plan  Patient Details  Name: Cody Bridges MRN: [id number] Date of Birth: September 30, 1932 Referring Provider:    Initial Encounter Date:    Pulmonary Rehab from 03/28/2017 in [facility] Cardiac and Pulmonary Rehab  Date  03/28/17      Visit Diagnosis: Chronic obstructive pulmonary disease, unspecified COPD type ([place])  Patient's Home Medications on Admission:  Current Outpatient Prescriptions:  .  ACCU-CHEK AVIVA test strip, TEST EVERY DAY, Disp: , Rfl: 5 .  ACCU-CHEK SOFTCLIX LANCETS lancets, daily., Disp: , Rfl: 5 .  calcium carbonate (TUMS - DOSED IN MG ELEMENTAL CALCIUM) 500 MG chewable tablet, Chew 1-2 tablets by mouth 3 (three) times daily as needed (for acid reflux/indigestion.)., Disp: , Rfl:  .  Cholecalciferol (VITAMIN D-3) 5000 units TABS, Take 5,000 Units by mouth daily., Disp: , Rfl:  .  Fluticasone-Umeclidin-Vilant (TRELEGY ELLIPTA) 100-62.5-25 MCG/INH AEPB, Inhale 1 puff into the lungs daily., Disp: 60 each, Rfl: 5 .  hydrochlorothiazide (HYDRODIURIL) 25 MG tablet, Take 25 mg by mouth daily., Disp: , Rfl:  .  lisinopril (PRINIVIL,ZESTRIL) 5 MG tablet, Take 5 mg by mouth daily., Disp: , Rfl:  .  metFORMIN (GLUCOPHAGE) 500 MG tablet, Take 500 mg by mouth 2 (two) times daily., Disp: , Rfl:  .  Multiple Vitamin (MULTIVITAMIN WITH MINERALS) TABS tablet, Take 1 tablet by mouth daily., Disp: , Rfl:  .  PROAIR HFA 108 (90 Base) MCG/ACT inhaler, Inhale 1-2 puffs into the lungs every 6 (six) hours as needed (for wheezing/shortness of breath). , Disp: , Rfl:  .  rosuvastatin (CRESTOR) 20 MG tablet, Take 20 mg by mouth daily., Disp: , Rfl:  .  triamcinolone ointment (KENALOG) 0.1 %, Apply 1 application topically 2 (two) times daily as needed (for dry skin/irritation.)., Disp: , Rfl:  .  SYMBICORT 160-4.5 MCG/ACT inhaler, Inhale 2 puffs into the lungs 2 (two) times daily. (Patient not taking: Reported on 03/28/2017), Disp: 1 Inhaler, Rfl: 3 .  umeclidinium-vilanterol  (ANORO ELLIPTA) 62.5-25 MCG/INH AEPB, Inhale 1 puff into the lungs daily. (Patient not taking: Reported on 03/28/2017), Disp: 60 each, Rfl: 4  Past Medical History: Past Medical History:  Diagnosis Date  . Arthritis   . Asthma   . Diabetes mellitus without complication ([place])     7829  . Dyspnea    with exertion  . GERD (gastroesophageal reflux disease)   . Hyperlipidemia   . Hypertension     Tobacco Use: History  Smoking Status  . Former Smoker  . Packs/day: 2.00  . Years: 35.00  . Types: Cigarettes  . Quit date: 10/30/1985  Smokeless Tobacco  . Never Used    Labs: Recent Review Flowsheet Data    Labs for ITP Cardiac and Pulmonary Rehab Latest Ref Rng & Units 10/24/2016 01/26/2017   Hemoglobin A1c 4.6 - 6.5 % 6.8(H) 6.5       Pulmonary Assessment Scores:     Pulmonary Assessment Scores    Row Name 03/28/17 1330         ADL UCSD   ADL Phase Entry     SOB Score total 76     Rest 2     Walk 3     Stairs 4     Bath 2     Dress 2     Shop 3       CAT Score   CAT Score 25       mMRC Score   mMRC Score 1  Pulmonary Function Assessment:     Pulmonary Function Assessment - 03/28/17 1348      Pulmonary Function Tests   FVC% 78 %  Test performed 12/15/2016   FEV1% 59 %   FEV1/FVC Ratio 59     Breath   Shortness of Breath Yes;Fear of Shortness of Breath;Limiting activity      Exercise Target Goals: Date: 03/28/17  Exercise Program Goal: Individual exercise prescription set with THRR, safety & activity barriers. Participant demonstrates ability to understand and report RPE using BORG scale, to self-measure pulse accurately, and to acknowledge the importance of the exercise prescription.  Exercise Prescription Goal: Starting with aerobic activity 30 plus minutes a day, 3 days per week for initial exercise prescription. Provide home exercise prescription and guidelines that participant acknowledges understanding prior to discharge.  Activity  Barriers & Risk Stratification:     Activity Barriers & Cardiac Risk Stratification - 03/28/17 1236      Activity Barriers & Cardiac Risk Stratification   Activity Barriers Arthritis;Back Problems;Shortness of Breath;Muscular Weakness  Low back pain, arthritis hands and other joints, legs weak, SOB from chronic lung disease      6 Minute Walk:     6 Minute Walk    Row Name 03/28/17 1332         6 Minute Walk   Distance 950 feet     Walk Time 6 minutes     # of Rest Breaks 0     MPH 1.79     METS 1.86     RPE 15     Perceived Dyspnea  2     VO2 Peak 6.5     Symptoms Yes (comment)     Comments back pain 7/10     Resting HR 95 bpm     Resting BP 122/68     Resting Oxygen Saturation  96 %     Exercise Oxygen Saturation  during 6 min walk 95 %     Max Ex. HR 132 bpm     Max Ex. BP 132/56     2 Minute Post BP 116/70       Interval HR   1 Minute HR 114     2 Minute HR 120     3 Minute HR 132     4 Minute HR 124     5 Minute HR 121     6 Minute HR 123     2 Minute Post HR 85     Interval Heart Rate? Yes       Interval Oxygen   Interval Oxygen? Yes     Baseline Oxygen Saturation % 96 %     1 Minute Oxygen Saturation % 95 %     1 Minute Liters of Oxygen 0 L     2 Minute Oxygen Saturation % 95 %     2 Minute Liters of Oxygen 0 L     3 Minute Oxygen Saturation % 95 %     3 Minute Liters of Oxygen 0 L     4 Minute Oxygen Saturation % 95 %     4 Minute Liters of Oxygen 0 L     5 Minute Oxygen Saturation % 96 %     5 Minute Liters of Oxygen 0 L     6 Minute Oxygen Saturation % 96 %     6 Minute Liters of Oxygen 0 L     2 Minute Post Oxygen Saturation % 97 %  2 Minute Post Liters of Oxygen 0 L       Oxygen Initial Assessment:     Oxygen Initial Assessment - 03/28/17 1231      Home Oxygen   Home Oxygen Device None   Sleep Oxygen Prescription None   Home Exercise Oxygen Prescription None   Home at Rest Exercise Oxygen Prescription None     Initial 6 min  Walk   Oxygen Used None     Program Oxygen Prescription   Program Oxygen Prescription None     Intervention   Short Term Goals To learn and understand importance of monitoring SPO2 with pulse oximeter and demonstrate accurate use of the pulse oximeter.;To learn and demonstrate proper pursed lip breathing techniques or other breathing techniques.;To learn and understand importance of maintaining oxygen saturations>88%;To learn and demonstrate proper use of respiratory medications   Long  Term Goals Verbalizes importance of monitoring SPO2 with pulse oximeter and return demonstration;Exhibits proper breathing techniques, such as pursed lip breathing or other method taught during program session;Demonstrates proper use of MDI's;Compliance with respiratory medication;Maintenance of O2 saturations>88%      Oxygen Re-Evaluation:   Oxygen Discharge (Final Oxygen Re-Evaluation):   Initial Exercise Prescription:     Initial Exercise Prescription - 03/28/17 1300      Date of Initial Exercise RX and Referring Provider   Date 03/28/17     Treadmill   MPH 1.5   Grade 0   Minutes 15   METs 2.15     Recumbant Bike   Level 1   RPM 60   Watts 5   Minutes 15   METs 1.8     NuStep   Level 2   SPM 80   Minutes 15   METs 2     Arm Ergometer   Level 1   RPM 50   Minutes 15   METs 1.6     Prescription Details   Frequency (times per week) 3   Duration Progress to 45 minutes of aerobic exercise without signs/symptoms of physical distress     Intensity   THRR 40-80% of Max Heartrate 112-129   Ratings of Perceived Exertion 11-15   Perceived Dyspnea 0-4     Progression   Progression Continue to progress workloads to maintain intensity without signs/symptoms of physical distress.     Resistance Training   Training Prescription Yes   Weight 3   Reps 10-15      Perform Capillary Blood Glucose checks as needed.  Exercise Prescription Changes:      Exercise Prescription  Changes    Row Name 03/28/17 1300             Response to Exercise   Blood Pressure (Admit) 122/68       Blood Pressure (Exercise) 132/56       Blood Pressure (Exit) 116/70       Heart Rate (Admit) 95 bpm       Heart Rate (Exercise) 132 bpm       Heart Rate (Exit) 85 bpm       Oxygen Saturation (Admit) 96 %       Oxygen Saturation (Exercise) 95 %       Oxygen Saturation (Exit) 97 %       Rating of Perceived Exertion (Exercise) 15       Perceived Dyspnea (Exercise) 2       Symptoms back pain 7/10       Duration Progress to 45 minutes of aerobic  exercise without signs/symptoms of physical distress          Exercise Comments:   Exercise Goals and Review:      Exercise Goals    Row Name 03/28/17 1336             Exercise Goals   Increase Physical Activity Yes       Intervention Provide advice, education, support and counseling about physical activity/exercise needs.;Develop an individualized exercise prescription for aerobic and resistive training based on initial evaluation findings, risk stratification, comorbidities and participant's personal goals.       Expected Outcomes Achievement of increased cardiorespiratory fitness and enhanced flexibility, muscular endurance and strength shown through measurements of functional capacity and personal statement of participant.       Increase Strength and Stamina Yes       Intervention Provide advice, education, support and counseling about physical activity/exercise needs.;Develop an individualized exercise prescription for aerobic and resistive training based on initial evaluation findings, risk stratification, comorbidities and participant's personal goals.       Expected Outcomes Achievement of increased cardiorespiratory fitness and enhanced flexibility, muscular endurance and strength shown through measurements of functional capacity and personal statement of participant.       Able to understand and use rate of perceived  exertion (RPE) scale Yes       Intervention Provide education and explanation on how to use RPE scale       Expected Outcomes Short Term: Able to use RPE daily in rehab to express subjective intensity level;Long Term:  Able to use RPE to guide intensity level when exercising independently       Able to understand and use Dyspnea scale Yes       Intervention Provide education and explanation on how to use Dyspnea scale       Expected Outcomes Short Term: Able to use Dyspnea scale daily in rehab to express subjective sense of shortness of breath during exertion;Long Term: Able to use Dyspnea scale to guide intensity level when exercising independently       Knowledge and understanding of Target Heart Rate Range (THRR) Yes       Intervention Provide education and explanation of THRR including how the numbers were predicted and where they are located for reference       Expected Outcomes Short Term: Able to state/look up THRR;Long Term: Able to use THRR to govern intensity when exercising independently;Short Term: Able to use daily as guideline for intensity in rehab       Able to check pulse independently Yes       Intervention Provide education and demonstration on how to check pulse in carotid and radial arteries.;Review the importance of being able to check your own pulse for safety during independent exercise       Expected Outcomes Short Term: Able to explain why pulse checking is important during independent exercise;Long Term: Able to check pulse independently and accurately       Understanding of Exercise Prescription Yes       Intervention Provide education, explanation, and written materials on patient's individual exercise prescription       Expected Outcomes Short Term: Able to explain program exercise prescription;Long Term: Able to explain home exercise prescription to exercise independently          Exercise Goals Re-Evaluation :   Discharge Exercise Prescription (Final Exercise  Prescription Changes):     Exercise Prescription Changes - 03/28/17 1300      Response to  Exercise   Blood Pressure (Admit) 122/68   Blood Pressure (Exercise) 132/56   Blood Pressure (Exit) 116/70   Heart Rate (Admit) 95 bpm   Heart Rate (Exercise) 132 bpm   Heart Rate (Exit) 85 bpm   Oxygen Saturation (Admit) 96 %   Oxygen Saturation (Exercise) 95 %   Oxygen Saturation (Exit) 97 %   Rating of Perceived Exertion (Exercise) 15   Perceived Dyspnea (Exercise) 2   Symptoms back pain 7/10   Duration Progress to 45 minutes of aerobic exercise without signs/symptoms of physical distress      Nutrition:  Target Goals: Understanding of nutrition guidelines, daily intake of sodium <1543m, cholesterol <2059m calories 30% from fat and 7% or less from saturated fats, daily to have 5 or more servings of fruits and vegetables.  Biometrics:     Pre Biometrics - 03/28/17 1330      Pre Biometrics   Height 5' 10.5" (1.791 m)   Weight 211 lb 4.8 oz (95.8 kg)   Waist Circumference 46 inches   Hip Circumference 44.5 inches   Waist to Hip Ratio 1.03 %   BMI (Calculated) 29.88       Nutrition Therapy Plan and Nutrition Goals:     Nutrition Therapy & Goals - 03/28/17 1237      Nutrition Therapy   RD appointment defered Yes  Deferred meeting with RD     Intervention Plan   Intervention Prescribe, educate and counsel regarding individualized specific dietary modifications aiming towards targeted core components such as weight, hypertension, lipid management, diabetes, heart failure and other comorbidities.   Expected Outcomes Short Term Goal: Understand basic principles of dietary content, such as calories, fat, sodium, cholesterol and nutrients.;Long Term Goal: Adherence to prescribed nutrition plan.  Has deferred meeting with RD.  Short term Goal: Attends RD classes during education sessions for the basic nutrition guidelines      Nutrition Discharge: Rate Your Plate  Scores:   Nutrition Goals Re-Evaluation:   Nutrition Goals Discharge (Final Nutrition Goals Re-Evaluation):   Psychosocial: Target Goals: Acknowledge presence or absence of significant depression and/or stress, maximize coping skills, provide positive support system. Participant is able to verbalize types and ability to use techniques and skills needed for reducing stress and depression.   Initial Review & Psychosocial Screening:     Initial Psych Review & Screening - 03/28/17 1239      Initial Review   Current issues with None Identified  HAd depression when told he possible had cancer.  CAncer was ruled out with a biospy and depression is gone.      Family Dynamics   Good Support System? Yes  VaMateo Flowhis significant other     Barriers   Psychosocial barriers to participate in program There are no identifiable barriers or psychosocial needs.;The patient should benefit from training in stress management and relaxation.     Screening Interventions   Interventions Encouraged to exercise;Provide feedback about the scores to participant;To provide support and resources with identified psychosocial needs      Quality of Life Scores:   PHQ-9: Recent Review Flowsheet Data    [facility] 03/28/2017 01/26/2017   Decreased Interest 0 0   Down, Depressed, Hopeless 0 0   PHQ - 2 Score 0 0   Altered sleeping 0 -   Tired, decreased energy 2 -   Change in appetite 2 -   Feeling bad or failure about yourself  0 -   Trouble concentrating 0 -  Moving slowly or fidgety/restless 0 -   Suicidal thoughts 0 -   PHQ-9 Score 4 -   Difficult doing work/chores Somewhat difficult -     Interpretation of Total Score  Total Score Depression Severity:  1-4 = Minimal depression, 5-9 = Mild depression, 10-14 = Moderate depression, 15-19 = Moderately severe depression, 20-27 = Severe depression   Psychosocial Evaluation and Intervention:   Psychosocial  Re-Evaluation:   Psychosocial Discharge (Final Psychosocial Re-Evaluation):   Education: Education Goals: Education classes will be provided on a weekly basis, covering required topics. Participant will state understanding/return demonstration of topics presented.  Learning Barriers/Preferences:     Learning Barriers/Preferences - 03/28/17 1320      Learning Barriers/Preferences   Learning Barriers None   Learning Preferences None      Education Topics: Initial Evaluation Education: - Verbal, written and demonstration of respiratory meds, RPE/PD scales, oximetry and breathing techniques. Instruction on use of nebulizers and MDIs: cleaning and proper use, rinsing mouth with steroid doses and importance of monitoring MDI activations.   Pulmonary Rehab from 03/28/2017 in [facility] Cardiac and Pulmonary Rehab  Date  03/28/17  Educator  Sb  Instruction Review Code  1- Verbalizes Understanding      General Nutrition Guidelines/Fats and Fiber: -Group instruction provided by verbal, written material, models and posters to present the general guidelines for heart healthy nutrition. Gives an explanation and review of dietary fats and fiber.   Controlling Sodium/Reading Food Labels: -Group verbal and written material supporting the discussion of sodium use in heart healthy nutrition. Review and explanation with models, verbal and written materials for utilization of the food label.   Exercise Physiology & Risk Factors: - Group verbal and written instruction with models to review the exercise physiology of the cardiovascular system and associated critical values. Details cardiovascular disease risk factors and the goals associated with each risk factor.   Aerobic Exercise & Resistance Training: - Gives group verbal and written discussion on the health impact of inactivity. On the components of aerobic and resistive training programs and the benefits of this training and how to safely  progress through these programs.   Flexibility, Balance, General Exercise Guidelines: - Provides group verbal and written instruction on the benefits of flexibility and balance training programs. Provides general exercise guidelines with specific guidelines to those with heart or lung disease. Demonstration and skill practice provided.   Stress Management: - Provides group verbal and written instruction about the health risks of elevated stress, cause of high stress, and healthy ways to reduce stress.   Depression: - Provides group verbal and written instruction on the correlation between heart/lung disease and depressed mood, treatment options, and the stigmas associated with seeking treatment.   Exercise & Equipment Safety: - Individual verbal instruction and demonstration of equipment use and safety with use of the equipment.   Infection Prevention: - Provides verbal and written material to individual with discussion of infection control including proper hand washing and proper equipment cleaning during exercise session.   Pulmonary Rehab from 03/28/2017 in [facility] Cardiac and Pulmonary Rehab  Date  03/28/17  Educator  Sb  Instruction Review Code  1- Verbalizes Understanding      Falls Prevention: - Provides verbal and written material to individual with discussion of falls prevention and safety.   Pulmonary Rehab from 03/28/2017 in [facility] Cardiac and Pulmonary Rehab  Date  03/28/17  Educator  SB  Instruction Review Code  1- Verbalizes Understanding      Diabetes: -  Individual verbal and written instruction to review signs/symptoms of diabetes, desired ranges of glucose level fasting, after meals and with exercise. Advice that pre and post exercise glucose checks will be done for 3 sessions at entry of program.   Pulmonary Rehab from 03/28/2017 in [facility] Cardiac and Pulmonary Rehab  Date  03/28/17  Educator  Sb  Instruction Review Code  1- Verbalizes Understanding      Chronic  Lung Diseases: - Group verbal and written instruction to review new updates, new respiratory medications, new advancements in procedures and treatments. Provide informative websites and "800" numbers of self-education.   Lung Procedures: - Group verbal and written instruction to describe testing methods done to diagnose lung disease. Review the outcome of test results. Describe the treatment choices: Pulmonary Function Tests, ABGs and oximetry.   Energy Conservation: - Provide group verbal and written instruction for methods to conserve energy, plan and organize activities. Instruct on pacing techniques, use of adaptive equipment and posture/positioning to relieve shortness of breath.   Triggers: - Group verbal and written instruction to review types of environmental controls: home humidity, furnaces, filters, dust mite/pet prevention, HEPA vacuums. To discuss weather changes, air quality and the benefits of nasal washing.   Exacerbations: - Group verbal and written instruction to provide: warning signs, infection symptoms, calling MD promptly, preventive modes, and value of vaccinations. Review: effective airway clearance, coughing and/or vibration techniques. Create an Sports administrator.   Oxygen: - Individual and group verbal and written instruction on oxygen therapy. Includes supplement oxygen, available portable oxygen systems, continuous and intermittent flow rates, oxygen safety, concentrators, and Medicare reimbursement for oxygen.   Respiratory Medications: - Group verbal and written instruction to review medications for lung disease. Drug class, frequency, complications, importance of spacers, rinsing mouth after steroid MDI's, and proper cleaning methods for nebulizers.   AED/CPR: - Group verbal and written instruction with the use of models to demonstrate the basic use of the AED with the basic ABC's of resuscitation.   Breathing Retraining: - Provides individuals verbal and  written instruction on purpose, frequency, and proper technique of diaphragmatic breathing and pursed-lipped breathing. Applies individual practice skills.   Pulmonary Rehab from 03/28/2017 in [facility] Cardiac and Pulmonary Rehab  Date  03/28/17  Educator  Sb  Instruction Review Code  1- Verbalizes Understanding      Anatomy and Physiology of the Lungs: - Group verbal and written instruction with the use of models to provide basic lung anatomy and physiology related to function, structure and complications of lung disease.   Anatomy & Physiology of the Heart: - Group verbal and written instruction and models provide basic cardiac anatomy and physiology, with the coronary electrical and arterial systems. Review of: AMI, Angina, Valve disease, Heart Failure, Cardiac Arrhythmia, Pacemakers, and the ICD.   Heart Failure: - Group verbal and written instruction on the basics of heart failure: signs/symptoms, treatments, explanation of ejection fraction, enlarged heart and cardiomyopathy.   Sleep Apnea: - Individual verbal and written instruction to review Obstructive Sleep Apnea. Review of risk factors, methods for diagnosing and types of masks and machines for OSA.   Anxiety: - Provides group, verbal and written instruction on the correlation between heart/lung disease and anxiety, treatment options, and management of anxiety.   Relaxation: - Provides group, verbal and written instruction about the benefits of relaxation for patients with heart/lung disease. Also provides patients with examples of relaxation techniques.   Cardiac Medications: - Group verbal and written instruction to review commonly prescribed  medications for heart disease. Reviews the medication, class of the drug, and side effects.   Know Your Numbers: -Group verbal and written instruction about important numbers in your health.  Review of Cholesterol, Blood Pressure, Diabetes, and BMI and the role they play in your  overall health.   Other: -Provides group and verbal instruction on various topics (see comments)    Knowledge Questionnaire Score:     Knowledge Questionnaire Score - 03/28/17 1320      Knowledge Questionnaire Score   Pre Score 6/10  reviewed correct response with Mikki Santee and he verbalized understanding of the response       Core Components/Risk Factors/Patient Goals at Admission:     Personal Goals and Risk Factors at Admission - 03/28/17 1338      Core Components/Risk Factors/Patient Goals on Admission   Improve shortness of breath with ADL's Yes   Intervention Provide education, individualized exercise plan and daily activity instruction to help decrease symptoms of SOB with activities of daily living.   Expected Outcomes Short Term: Achieves a reduction of symptoms when performing activities of daily living.  Long term goal: Improved ability to perform ADL's with less SOB   Develop more efficient breathing techniques such as purse lipped breathing and diaphragmatic breathing; and practicing self-pacing with activity Yes   Intervention Provide education, demonstration and support about specific breathing techniuqes utilized for more efficient breathing. Include techniques such as pursed lipped breathing, diaphragmatic breathing and self-pacing activity.   Expected Outcomes Short Term: Participant will be able to demonstrate and use breathing techniques as needed throughout daily activities.  Long term goal: Continued use of techniques learned to improve efficiency of breathing   Increase knowledge of respiratory medications and ability to use respiratory devices properly  Yes   Intervention Provide education and demonstration as needed of appropriate use of medications, inhalers, and oxygen therapy.   Expected Outcomes Short Term: Achieves understanding of medications use. Understands that oxygen is a medication prescribed by physician. Demonstrates appropriate use of inhaler and  oxygen therapy.  Long Term Goal: Maintains ability to utilize medications, inhalers and oxygen therapy   Diabetes Yes   Intervention Provide education about signs/symptoms and action to take for hypo/hyperglycemia.;Provide education about proper nutrition, including hydration, and aerobic/resistive exercise prescription along with prescribed medications to achieve blood glucose in normal ranges: Fasting glucose 65-99 mg/dL   Expected Outcomes Short Term: Participant verbalizes understanding of the signs/symptoms and immediate care of hyper/hypoglycemia, proper foot care and importance of medication, aerobic/resistive exercise and nutrition plan for blood glucose control.;Long Term: Attainment of HbA1C < 7%.   Hypertension Yes   Intervention Provide education on lifestyle modifcations including regular physical activity/exercise, weight management, moderate sodium restriction and increased consumption of fresh fruit, vegetables, and low fat dairy, alcohol moderation, and smoking cessation.;Monitor prescription use compliance.   Expected Outcomes Short Term: Continued assessment and intervention until BP is < 140/41m HG in hypertensive participants. < 130/815mHG in hypertensive participants with diabetes, heart failure or chronic kidney disease.;Long Term: Maintenance of blood pressure at goal levels.   Lipids Yes   Intervention Provide education and support for participant on nutrition & aerobic/resistive exercise along with prescribed medications to achieve LDL <7027mHDL >63m41m Expected Outcomes Short Term: Participant states understanding of desired cholesterol values and is compliant with medications prescribed. Participant is following exercise prescription and nutrition guidelines.;Long Term: Cholesterol controlled with medications as prescribed, with individualized exercise RX and with personalized nutrition plan. Value goals: LDL <  21m, HDL > 40 mg.      Core Components/Risk Factors/Patient  Goals Review:    Core Components/Risk Factors/Patient Goals at Discharge (Final Review):    ITP Comments:     ITP Comments    Row Name 03/28/17 1230 03/28/17 1403         ITP Comments Medical review completed. INitial ITP created and sent to Dr MEmily Filbertfor signature and suggested changes.   [facility] Patient met with Dr. MEmily Filber[place]irector of [place] for [place] to face contact.         Comments: Initial ITP

## 2017-04-05 ENCOUNTER — Encounter: Payer: Medicare Other | Attending: Internal Medicine

## 2017-04-05 DIAGNOSIS — J449 Chronic obstructive pulmonary disease, unspecified: Secondary | ICD-10-CM | POA: Insufficient documentation

## 2017-04-05 DIAGNOSIS — Z87891 Personal history of nicotine dependence: Secondary | ICD-10-CM | POA: Insufficient documentation

## 2017-04-05 DIAGNOSIS — I10 Essential (primary) hypertension: Secondary | ICD-10-CM | POA: Insufficient documentation

## 2017-04-05 DIAGNOSIS — Z7984 Long term (current) use of oral hypoglycemic drugs: Secondary | ICD-10-CM | POA: Insufficient documentation

## 2017-04-05 DIAGNOSIS — E119 Type 2 diabetes mellitus without complications: Secondary | ICD-10-CM | POA: Insufficient documentation

## 2017-04-05 DIAGNOSIS — Z79899 Other long term (current) drug therapy: Secondary | ICD-10-CM | POA: Insufficient documentation

## 2017-04-10 ENCOUNTER — Encounter: Payer: Medicare Other | Admitting: *Deleted

## 2017-04-10 DIAGNOSIS — I10 Essential (primary) hypertension: Secondary | ICD-10-CM | POA: Diagnosis not present

## 2017-04-10 DIAGNOSIS — Z7984 Long term (current) use of oral hypoglycemic drugs: Secondary | ICD-10-CM | POA: Diagnosis not present

## 2017-04-10 DIAGNOSIS — Z87891 Personal history of nicotine dependence: Secondary | ICD-10-CM | POA: Diagnosis not present

## 2017-04-10 DIAGNOSIS — J449 Chronic obstructive pulmonary disease, unspecified: Secondary | ICD-10-CM | POA: Diagnosis not present

## 2017-04-10 DIAGNOSIS — E119 Type 2 diabetes mellitus without complications: Secondary | ICD-10-CM | POA: Diagnosis not present

## 2017-04-10 DIAGNOSIS — Z79899 Other long term (current) drug therapy: Secondary | ICD-10-CM | POA: Diagnosis not present

## 2017-04-10 LAB — GLUCOSE, CAPILLARY
Glucose-Capillary: 159 mg/dL — ABNORMAL HIGH (ref 65–99)
Glucose-Capillary: 102 mg/dL — ABNORMAL HIGH (ref 65–99)

## 2017-04-10 NOTE — Progress Notes (Signed)
Daily Session Note  Patient Details  Name: Cody Bridges MRN: [id number] Date of Birth: August 28, 1932 Referring Provider:    Encounter Date: 04/10/2017  Check In:     Session Check In - 04/10/17 1018      Check-In   Location [facility]   Staff Present Nada Maclachlan, BA, ACSM CEP, Exercise Physiologist;Diontre Harps Amedeo Plenty, BS, ACSM CEP, Exercise Physiologist;Joseph Flavia Shipper   Supervising physician immediately available to respond to emergencies [facility] immediately available ER MD   Physician(s) Dr. Corky Downs and Dr. Jimmye Norman   Medication changes reported     No   Fall or balance concerns reported    No   Warm-up and Cool-down Performed as group-led instruction   Resistance Training Performed Yes   VAD Patient? No     Pain Assessment   Currently in Pain? No/denies   Multiple Pain Sites No         History  Smoking Status  . Former Smoker  . Packs/day: 2.00  . Years: 35.00  . Types: Cigarettes  . Quit date: 10/30/1985  Smokeless Tobacco  . Never Used    Goals Met:  Proper associated with RPD/PD & O2 Sat Independence with exercise equipment Exercise tolerated well Strength training completed today  Personal goals reviewed  Goals Unmet:  Not Applicable  Comments: First full day of exercise!  Patient was oriented to gym and equipment including functions, settings, policies, and procedures.  Patient's individual exercise prescription and treatment plan were reviewed.  All starting workloads were established based on the results of the 6 minute walk test done at initial orientation visit.  The plan for exercise progression was also introduced and progression will be customized based on patient's performance and goals.    Dr. Emily Filbert is Medical Director for [place] and [facility].

## 2017-04-12 DIAGNOSIS — J449 Chronic obstructive pulmonary disease, unspecified: Secondary | ICD-10-CM

## 2017-04-12 DIAGNOSIS — Z87891 Personal history of nicotine dependence: Secondary | ICD-10-CM | POA: Diagnosis not present

## 2017-04-12 DIAGNOSIS — Z7984 Long term (current) use of oral hypoglycemic drugs: Secondary | ICD-10-CM | POA: Diagnosis not present

## 2017-04-12 DIAGNOSIS — Z79899 Other long term (current) drug therapy: Secondary | ICD-10-CM | POA: Diagnosis not present

## 2017-04-12 DIAGNOSIS — I10 Essential (primary) hypertension: Secondary | ICD-10-CM | POA: Diagnosis not present

## 2017-04-12 DIAGNOSIS — E119 Type 2 diabetes mellitus without complications: Secondary | ICD-10-CM | POA: Diagnosis not present

## 2017-04-12 LAB — GLUCOSE, CAPILLARY
GLUCOSE-CAPILLARY: 127 mg/dL — AB (ref 65–99)
Glucose-Capillary: 125 mg/dL — ABNORMAL HIGH (ref 65–99)

## 2017-04-12 NOTE — Progress Notes (Signed)
Daily Session Note  Patient Details  Name: Jasmin Trumbull Mcginnity MRN: [id number] Date of Birth: [birth year] Referring Provider:    Encounter Date: 04/12/2017  Check In:     Session Check In - 04/12/17 1021      Check-In   Location [facility]   Staff Present Alberteen Sam, MA, ACSM RCEP, Exercise Physiologist;Amanda Oletta Darter, BA, ACSM CEP, Exercise Physiologist;Iram Astorino Flavia Shipper   Supervising physician immediately available to respond to emergencies [facility] immediately available ER MD   Physician(s) Dr. Alfred Levins and Jimmye Norman   Medication changes reported     No   Fall or balance concerns reported    No   Warm-up and Cool-down Performed as group-led instruction   Resistance Training Performed Yes   VAD Patient? No     Pain Assessment   Currently in Pain? No/denies   Multiple Pain Sites No         History  Smoking Status  . Former Smoker  . Packs/day: 2.00  . Years: 35.00  . Types: Cigarettes  . Quit date: 10/30/1985  Smokeless Tobacco  . Never Used    Goals Met:  Exercise tolerated well No report of cardiac concerns or symptoms Strength training completed today  Goals Unmet:  Not Applicable  Comments: Pt able to follow exercise prescription today without complaint.  Will continue to monitor for progression.   Dr. Emily Filbert is Medical Director for [place] and [facility].

## 2017-04-19 DIAGNOSIS — J449 Chronic obstructive pulmonary disease, unspecified: Secondary | ICD-10-CM

## 2017-04-19 DIAGNOSIS — Z87891 Personal history of nicotine dependence: Secondary | ICD-10-CM | POA: Diagnosis not present

## 2017-04-19 DIAGNOSIS — I10 Essential (primary) hypertension: Secondary | ICD-10-CM | POA: Diagnosis not present

## 2017-04-19 DIAGNOSIS — Z7984 Long term (current) use of oral hypoglycemic drugs: Secondary | ICD-10-CM | POA: Diagnosis not present

## 2017-04-19 DIAGNOSIS — E119 Type 2 diabetes mellitus without complications: Secondary | ICD-10-CM | POA: Diagnosis not present

## 2017-04-19 DIAGNOSIS — Z79899 Other long term (current) drug therapy: Secondary | ICD-10-CM | POA: Diagnosis not present

## 2017-04-19 LAB — GLUCOSE, CAPILLARY
GLUCOSE-CAPILLARY: 81 mg/dL (ref 65–99)
Glucose-Capillary: 111 mg/dL — ABNORMAL HIGH (ref 65–99)

## 2017-04-19 NOTE — Progress Notes (Signed)
Daily Session Note  Patient Details  Name: Brace Welte Corti MRN: [id number] Date of Birth: 28-Aug-1932 Referring Provider:    Encounter Date: 04/19/2017  Check In:     Session Check In - 04/19/17 1046      Check-In   Location [facility]   Staff Present Alberteen Sam, MA, ACSM RCEP, Exercise Physiologist;Amanda Oletta Darter, BA, ACSM CEP, Exercise Physiologist;Joseph Flavia Shipper   Supervising physician immediately available to respond to emergencies [facility] immediately available ER MD   Physician(s) Jimmye Norman and Joni Fears   Medication changes reported     No   Fall or balance concerns reported    No   Warm-up and Cool-down Performed as group-led Location manager Performed Yes   VAD Patient? No     Pain Assessment   Currently in Pain? No/denies         History  Smoking Status  . Former Smoker  . Packs/day: 2.00  . Years: 35.00  . Types: Cigarettes  . Quit date: 10/30/1985  Smokeless Tobacco  . Never Used    Goals Met:  Proper associated with RPD/PD & O2 Sat Independence with exercise equipment Personal goals reviewed Strength training completed today  Goals Unmet:  Not Applicable  Comments: Pt able to follow exercise prescription today without complaint.  Will continue to monitor for progression.    Dr. Emily Filbert is Medical Director for [place] and [facility].

## 2017-04-24 DIAGNOSIS — J449 Chronic obstructive pulmonary disease, unspecified: Secondary | ICD-10-CM | POA: Diagnosis not present

## 2017-04-24 DIAGNOSIS — E119 Type 2 diabetes mellitus without complications: Secondary | ICD-10-CM | POA: Diagnosis not present

## 2017-04-24 DIAGNOSIS — Z7984 Long term (current) use of oral hypoglycemic drugs: Secondary | ICD-10-CM | POA: Diagnosis not present

## 2017-04-24 DIAGNOSIS — Z79899 Other long term (current) drug therapy: Secondary | ICD-10-CM | POA: Diagnosis not present

## 2017-04-24 DIAGNOSIS — Z87891 Personal history of nicotine dependence: Secondary | ICD-10-CM | POA: Diagnosis not present

## 2017-04-24 DIAGNOSIS — I10 Essential (primary) hypertension: Secondary | ICD-10-CM | POA: Diagnosis not present

## 2017-04-24 NOTE — Progress Notes (Signed)
Pulmonary Individual Treatment Plan  Patient Details  Name: Cody Bridges MRN: [id number] Date of Birth: 1933/03/28 Referring Provider:    Initial Encounter Date:    Pulmonary Rehab from 03/28/2017 in [facility] Card[facility] Pul[facility] Re[facility]  Date  03/28/17      Visit Diagnosis: Chronic obstructive pulmonary disease, unspecified COPD type ([place])  Patient's Home Medications on Admission:  Current Outpatient Prescriptions:  .  ACCU-CHEK AVIVA test strip, TEST EVERY DAY, Disp: , Rfl: 5 .  ACCU-CHEK SOFTCLIX LANCETS lancets, daily., Disp: , Rfl: 5 .  calcium carbonate (TUMS - DOSED IN MG ELEMENTAL CALCIUM) 500 MG chewable tablet, Chew 1-2 tablets by mouth 3 (three) times daily as needed (for acid reflux/indigestion.)., Disp: , Rfl:  .  Cholecalciferol (VITAMIN D-3) 5000 units TABS, Take 5,000 Units by mouth daily., Disp: , Rfl:  .  Fluticasone-Umeclidin-Vilant (TRELEGY ELLIPTA) 100-62.5-25 MCG/INH AEPB, Inhale 1 puff into the lungs daily., Disp: 60 each, Rfl: 5 .  hydrochlorothiazide (HYDRODIURIL) 25 MG tablet, Take 25 mg by mouth daily., Disp: , Rfl:  .  lisinopril (PRINIVIL,ZESTRIL) 5 MG tablet, Take 5 mg by mouth daily., Disp: , Rfl:  .  metFORMIN (GLUCOPHAGE) 500 MG tablet, Take 500 mg by mouth 2 (two) times daily., Disp: , Rfl:  .  Multiple Vitamin (MULTIVITAMIN WITH MINERALS) TABS tablet, Take 1 tablet by mouth daily., Disp: , Rfl:  .  PROAIR HFA 108 (90 Base) MCG/ACT inhaler, Inhale 1-2 puffs into the lungs every 6 (six) hours as needed (for wheezing/shortness of breath). , Disp: , Rfl:  .  rosuvastatin (CRESTOR) 20 MG tablet, Take 20 mg by mouth daily., Disp: , Rfl:  .  SYMBICORT 160-4.5 MCG/ACT inhaler, Inhale 2 puffs into the lungs 2 (two) times daily. (Patient not taking: Reported on 03/28/2017), Disp: 1 Inhaler, Rfl: 3 .  triamcinolone ointment (KENALOG) 0.1 %, Apply 1 application topically 2 (two) times daily as needed (for dry skin/irritation.)., Disp: , Rfl:  .  umeclidinium-vilanterol  (ANORO ELLIPTA) 62.5-25 MCG/INH AEPB, Inhale 1 puff into the lungs daily. (Patient not taking: Reported on 03/28/2017), Disp: 60 each, Rfl: 4  Past Medical History: Past Medical History:  Diagnosis Date  . Arthritis   . Asthma   . Diabetes mellitus without complication ([place])     3143  . Dyspnea    with exertion  . GERD (gastroesophageal reflux disease)   . Hyperlipidemia   . Hypertension     Tobacco Use: History  Smoking Status  . Former Smoker  . Packs/day: 2.00  . Years: 35.00  . Types: Cigarettes  . Quit date: 10/30/1985  Smokeless Tobacco  . Never Used    Labs: Recent Review Flowsheet Data    Labs for ITP Cardiac and Pulmonary Rehab Latest Ref Rng & Units 10/24/2016 01/26/2017   Hemoglobin A1c 4.6 - 6.5 % 6.8(H) 6.5       Pulmonary Assessment Scores:     Pulmonary Assessment Scores    Row Name 03/28/17 1330         ADL UCSD   ADL Phase Entry     SOB Score total 76     Rest 2     Walk 3     Stairs 4     Bath 2     Dress 2     Shop 3       CAT Score   CAT Score 25       mMRC Score   mMRC Score 1  Pulmonary Function Assessment:     Pulmonary Function Assessment - 03/28/17 1348      Pulmonary Function Tests   FVC% 78 %  Test performed 12/15/2016   FEV1% 59 %   FEV1/FVC Ratio 59     Breath   Shortness of Breath Yes;Fear of Shortness of Breath;Limiting activity      Exercise Target Goals:    Exercise Program Goal: Individual exercise prescription set with THRR, safety & activity barriers. Participant demonstrates ability to understand and report RPE using BORG scale, to self-measure pulse accurately, and to acknowledge the importance of the exercise prescription.  Exercise Prescription Goal: Starting with aerobic activity 30 plus minutes a day, 3 days per week for initial exercise prescription. Provide home exercise prescription and guidelines that participant acknowledges understanding prior to discharge.  Activity Barriers & Risk  Stratification:     Activity Barriers & Cardiac Risk Stratification - 03/28/17 1236      Activity Barriers & Cardiac Risk Stratification   Activity Barriers Arthritis;Back Problems;Shortness of Breath;Muscular Weakness  Low back pain, arthritis hands and other joints, legs weak, SOB from chronic lung disease      6 Minute Walk:     6 Minute Walk    Row Name 03/28/17 1332         6 Minute Walk   Distance 950 feet     Walk Time 6 minutes     # of Rest Breaks 0     MPH 1.79     METS 1.86     RPE 15     Perceived Dyspnea  2     VO2 Peak 6.5     Symptoms Yes (comment)     Comments back pain 7/10     Resting HR 95 bpm     Resting BP 122/68     Resting Oxygen Saturation  96 %     Exercise Oxygen Saturation  during 6 min walk 95 %     Max Ex. HR 132 bpm     Max Ex. BP 132/56     2 Minute Post BP 116/70       Interval HR   1 Minute HR 114     2 Minute HR 120     3 Minute HR 132     4 Minute HR 124     5 Minute HR 121     6 Minute HR 123     2 Minute Post HR 85     Interval Heart Rate? Yes       Interval Oxygen   Interval Oxygen? Yes     Baseline Oxygen Saturation % 96 %     1 Minute Oxygen Saturation % 95 %     1 Minute Liters of Oxygen 0 L     2 Minute Oxygen Saturation % 95 %     2 Minute Liters of Oxygen 0 L     3 Minute Oxygen Saturation % 95 %     3 Minute Liters of Oxygen 0 L     4 Minute Oxygen Saturation % 95 %     4 Minute Liters of Oxygen 0 L     5 Minute Oxygen Saturation % 96 %     5 Minute Liters of Oxygen 0 L     6 Minute Oxygen Saturation % 96 %     6 Minute Liters of Oxygen 0 L     2 Minute Post Oxygen Saturation % 97 %  2 Minute Post Liters of Oxygen 0 L       Oxygen Initial Assessment:     Oxygen Initial Assessment - 03/28/17 1231      Home Oxygen   Home Oxygen Device None   Sleep Oxygen Prescription None   Home Exercise Oxygen Prescription None   Home at Rest Exercise Oxygen Prescription None     Initial 6 min Walk   Oxygen  Used None     Program Oxygen Prescription   Program Oxygen Prescription None     Intervention   Short Term Goals To learn and understand importance of monitoring SPO2 with pulse oximeter and demonstrate accurate use of the pulse oximeter.;To learn and demonstrate proper pursed lip breathing techniques or other breathing techniques.;To learn and understand importance of maintaining oxygen saturations>88%;To learn and demonstrate proper use of respiratory medications   Long  Term Goals Verbalizes importance of monitoring SPO2 with pulse oximeter and return demonstration;Exhibits proper breathing techniques, such as pursed lip breathing or other method taught during program session;Demonstrates proper use of MDI's;Compliance with respiratory medication;Maintenance of O2 saturations>88%      Oxygen Re-Evaluation:     Oxygen Re-Evaluation    Row Name 04/10/17 1040 04/19/17 1349           Program Oxygen Prescription   Program Oxygen Prescription  - None        Home Oxygen   Home Oxygen Device None None      Sleep Oxygen Prescription None None      Home Exercise Oxygen Prescription None None      Home at Rest Exercise Oxygen Prescription None None        Goals/Expected Outcomes   Short Term Goals To learn and understand importance of monitoring SPO2 with pulse oximeter and demonstrate accurate use of the pulse oximeter.;To learn and demonstrate proper pursed lip breathing techniques or other breathing techniques.;To learn and understand importance of maintaining oxygen saturations>88%;To learn and demonstrate proper use of respiratory medications To learn and understand importance of maintaining oxygen saturations>88%;To learn and demonstrate proper use of respiratory medications;To learn and demonstrate proper pursed lip breathing techniques or other breathing techniques.;To learn and understand importance of monitoring SPO2 with pulse oximeter and demonstrate accurate use of the pulse  oximeter.      Long  Term Goals Maintenance of O2 saturations>88%;Compliance with respiratory medication;Verbalizes importance of monitoring SPO2 with pulse oximeter and return demonstration;Exhibits proper breathing techniques, such as pursed lip breathing or other method taught during program session;Demonstrates proper use of MDI's Verbalizes importance of monitoring SPO2 with pulse oximeter and return demonstration;Exhibits proper breathing techniques, such as pursed lip breathing or other method taught during program session;Demonstrates proper use of MDI's;Compliance with respiratory medication;Maintenance of O2 saturations>88%      Comments Reviewed PLB technique with pt.  Talked about how it work and it's important to maintaining his exercise saturations.   Informed Burns about the effects of COPD, He does not have a pulse oximeter at home to check his oxygen. Informed him how to obtain one and to make sure his oxygen is above 88 percent. He needs alot of imrpovement on PLB techniques.      Goals/Expected Outcomes Short: Become more profiecient at using PLB.   Long: Become independent at using PLB. Short:obtatin a pulse oximeter and work on PLB. Long: Check and maintain an oxygen saturation greater than 88 precent. Be proficient with PLB         Oxygen Discharge (Final Oxygen Re-Evaluation):  Oxygen Re-Evaluation - 04/19/17 1349      Program Oxygen Prescription   Program Oxygen Prescription None     Home Oxygen   Home Oxygen Device None   Sleep Oxygen Prescription None   Home Exercise Oxygen Prescription None   Home at Rest Exercise Oxygen Prescription None     Goals/Expected Outcomes   Short Term Goals To learn and understand importance of maintaining oxygen saturations>88%;To learn and demonstrate proper use of respiratory medications;To learn and demonstrate proper pursed lip breathing techniques or other breathing techniques.;To learn and understand importance of monitoring  SPO2 with pulse oximeter and demonstrate accurate use of the pulse oximeter.   Long  Term Goals Verbalizes importance of monitoring SPO2 with pulse oximeter and return demonstration;Exhibits proper breathing techniques, such as pursed lip breathing or other method taught during program session;Demonstrates proper use of MDI's;Compliance with respiratory medication;Maintenance of O2 saturations>88%   Comments Informed Chukwudi about the effects of COPD, He does not have a pulse oximeter at home to check his oxygen. Informed him how to obtain one and to make sure his oxygen is above 88 percent. He needs alot of imrpovement on PLB techniques.   Goals/Expected Outcomes Short:obtatin a pulse oximeter and work on PLB. Long: Check and maintain an oxygen saturation greater than 88 precent. Be proficient with PLB      Initial Exercise Prescription:     Initial Exercise Prescription - 03/28/17 1300      Date of Initial Exercise RX and Referring Provider   Date 03/28/17     Treadmill   MPH 1.5   Grade 0   Minutes 15   METs 2.15     Recumbant Bike   Level 1   RPM 60   Watts 5   Minutes 15   METs 1.8     NuStep   Level 2   SPM 80   Minutes 15   METs 2     Arm Ergometer   Level 1   RPM 50   Minutes 15   METs 1.6     Prescription Details   Frequency (times per week) 3   Duration Progress to 45 minutes of aerobic exercise without signs/symptoms of physical distress     Intensity   THRR 40-80% of Max Heartrate 112-129   Ratings of Perceived Exertion 11-15   Perceived Dyspnea 0-4     Progression   Progression Continue to progress workloads to maintain intensity without signs/symptoms of physical distress.     Resistance Training   Training Prescription Yes   Weight 3   Reps 10-15      Perform Capillary Blood Glucose checks as needed.  Exercise Prescription Changes:     Exercise Prescription Changes    Row Name 03/28/17 1300 04/12/17 1500           Response to  Exercise   Blood Pressure (Admit) 122/68 132/64      Blood Pressure (Exercise) 132/56  -      Blood Pressure (Exit) 116/70 128/68      Heart Rate (Admit) 95 bpm 95 bpm      Heart Rate (Exercise) 132 bpm 96 bpm      Heart Rate (Exit) 85 bpm 56 bpm      Oxygen Saturation (Admit) 96 % 95 %      Oxygen Saturation (Exercise) 95 % 95 %      Oxygen Saturation (Exit) 97 % 97 %      Rating of Perceived Exertion (  Exercise) 15 15      Perceived Dyspnea (Exercise) 2 3      Symptoms back pain 7/10 none      Duration Progress to 45 minutes of aerobic exercise without signs/symptoms of physical distress Progress to 45 minutes of aerobic exercise without signs/symptoms of physical distress        Progression   Progression  - Continue to progress workloads to maintain intensity without signs/symptoms of physical distress.      Average METs  - 1.9        Resistance Training   Training Prescription  - Yes      Weight  - 3      Reps  - 10-15        Interval Training   Interval Training  - No        NuStep   Level  - 2      SPM  - 72      Minutes  - 15      METs  - 2        Arm Ergometer   Level  - 1      RPM  - 43      Minutes  - 15      METs  - 1.8         Exercise Comments:     Exercise Comments    Row Name 04/10/17 1046           Exercise Comments First full day of exercise!  Patient was oriented to gym and equipment including functions, settings, policies, and procedures.  Patient's individual exercise prescription and treatment plan were reviewed.  All starting workloads were established based on the results of the 6 minute walk test done at initial orientation visit.  The plan for exercise progression was also introduced and progression will be customized based on patient's performance and goals.          Exercise Goals and Review:     Exercise Goals    Row Name 03/28/17 1336             Exercise Goals   Increase Physical Activity Yes       Intervention Provide  advice, education, support and counseling about physical activity/exercise needs.;Develop an individualized exercise prescription for aerobic and resistive training based on initial evaluation findings, risk stratification, comorbidities and participant's personal goals.       Expected Outcomes Achievement of increased cardiorespiratory fitness and enhanced flexibility, muscular endurance and strength shown through measurements of functional capacity and personal statement of participant.       Increase Strength and Stamina Yes       Intervention Provide advice, education, support and counseling about physical activity/exercise needs.;Develop an individualized exercise prescription for aerobic and resistive training based on initial evaluation findings, risk stratification, comorbidities and participant's personal goals.       Expected Outcomes Achievement of increased cardiorespiratory fitness and enhanced flexibility, muscular endurance and strength shown through measurements of functional capacity and personal statement of participant.       Able to understand and use rate of perceived exertion (RPE) scale Yes       Intervention Provide education and explanation on how to use RPE scale       Expected Outcomes Short Term: Able to use RPE daily in rehab to express subjective intensity level;Long Term:  Able to use RPE to guide intensity level when exercising independently       Able to  understand and use Dyspnea scale Yes       Intervention Provide education and explanation on how to use Dyspnea scale       Expected Outcomes Short Term: Able to use Dyspnea scale daily in rehab to express subjective sense of shortness of breath during exertion;Long Term: Able to use Dyspnea scale to guide intensity level when exercising independently       Knowledge and understanding of Target Heart Rate Range (THRR) Yes       Intervention Provide education and explanation of THRR including how the numbers were predicted  and where they are located for reference       Expected Outcomes Short Term: Able to state/look up THRR;Long Term: Able to use THRR to govern intensity when exercising independently;Short Term: Able to use daily as guideline for intensity in rehab       Able to check pulse independently Yes       Intervention Provide education and demonstration on how to check pulse in carotid and radial arteries.;Review the importance of being able to check your own pulse for safety during independent exercise       Expected Outcomes Short Term: Able to explain why pulse checking is important during independent exercise;Long Term: Able to check pulse independently and accurately       Understanding of Exercise Prescription Yes       Intervention Provide education, explanation, and written materials on patient's individual exercise prescription       Expected Outcomes Short Term: Able to explain program exercise prescription;Long Term: Able to explain home exercise prescription to exercise independently          Exercise Goals Re-Evaluation :     Exercise Goals Re-Evaluation    Row Name 04/12/17 1504             Exercise Goal Re-Evaluation   Exercise Goals Review Increase Physical Activity;Increase Strength and Stamina       Comments Mr Tisdale has tolerated exercise well.  He had some muscle soreness after his first session.       Expected Outcomes Short - Mr Wengert will continue to attend regularly.  Long - Mr Finlay will progress with exercise.          Discharge Exercise Prescription (Final Exercise Prescription Changes):     Exercise Prescription Changes - 04/12/17 1500      Response to Exercise   Blood Pressure (Admit) 132/64   Blood Pressure (Exit) 128/68   Heart Rate (Admit) 95 bpm   Heart Rate (Exercise) 96 bpm   Heart Rate (Exit) 56 bpm   Oxygen Saturation (Admit) 95 %   Oxygen Saturation (Exercise) 95 %   Oxygen Saturation (Exit) 97 %   Rating of Perceived Exertion (Exercise) 15    Perceived Dyspnea (Exercise) 3   Symptoms none   Duration Progress to 45 minutes of aerobic exercise without signs/symptoms of physical distress     Progression   Progression Continue to progress workloads to maintain intensity without signs/symptoms of physical distress.   Average METs 1.9     Resistance Training   Training Prescription Yes   Weight 3   Reps 10-15     Interval Training   Interval Training No     NuStep   Level 2   SPM 72   Minutes 15   METs 2     Arm Ergometer   Level 1   RPM 43   Minutes 15   METs 1.8  Nutrition:  Target Goals: Understanding of nutrition guidelines, daily intake of sodium <1523m, cholesterol <2035m calories 30% from fat and 7% or less from saturated fats, daily to have 5 or more servings of fruits and vegetables.  Biometrics:     Pre Biometrics - 03/28/17 1330      Pre Biometrics   Height 5' 10.5" (1.791 m)   Weight 211 lb 4.8 oz (95.8 kg)   Waist Circumference 46 inches   Hip Circumference 44.5 inches   Waist to Hip Ratio 1.03 %   BMI (Calculated) 29.88       Nutrition Therapy Plan and Nutrition Goals:     Nutrition Therapy & Goals - 03/28/17 1237      Nutrition Therapy   RD appointment defered Yes  Deferred meeting with RD     Intervention Plan   Intervention Prescribe, educate and counsel regarding individualized specific dietary modifications aiming towards targeted core components such as weight, hypertension, lipid management, diabetes, heart failure and other comorbidities.   Expected Outcomes Short Term Goal: Understand basic principles of dietary content, such as calories, fat, sodium, cholesterol and nutrients.;Long Term Goal: Adherence to prescribed nutrition plan.  Has deferred meeting with RD.  Short term Goal: Attends RD classes during education sessions for the basic nutrition guidelines      Nutrition Discharge: Rate Your Plate Scores:   Nutrition Goals Re-Evaluation:     Nutrition Goals  Re-Evaluation    Ro[place] 04/19/17 1403             Goals   Current Weight 215 lb (97.5 kg)       Nutrition Goal Patient declined to see the nutritionist       Comment Roberts weight will continued to be monitored and inform him that he can meet with a dietician is he wants to.       Expected Outcome Short: lose weight using exercise. Long: Maintain weightloss with exercise at home.          Nutrition Goals Discharge (Final Nutrition Goals Re-Evaluation):     Nutrition Goals Re-Evaluation - 04/19/17 1403      Goals   Current Weight 215 lb (97.5 kg)   Nutrition Goal Patient declined to see the nutritionist   Comment Roberts weight will continued to be monitored and inform him that he can meet with a dietician is he wants to.   Expected Outcome Short: lose weight using exercise. Long: Maintain weightloss with exercise at home.      Psychosocial: Target Goals: Acknowledge presence or absence of significant depression and/or stress, maximize coping skills, provide positive support system. Participant is able to verbalize types and ability to use techniques and skills needed for reducing stress and depression.   Initial Review & Psychosocial Screening:     Initial Psych Review & Screening - 03/28/17 1239      Initial Review   Current issues with None Identified  HAd depression when told he possible had cancer.  CAncer was ruled out with a biospy and depression is gone.      Family Dynamics   Good Support System? Yes  VaMateo Flowhis significant other     Barriers   Psychosocial barriers to participate in program There are no identifiable barriers or psychosocial needs.;The patient should benefit from training in stress management and relaxation.     Screening Interventions   Interventions Encouraged to exercise;Provide feedback about the scores to participant;To provide support and resources with identified psychosocial needs  Quality of Life  Scores:   PHQ-9: Recent Review Flowsheet Data    Depression screen [facility] 2/9 03/28/2017 01/26/2017   Decreased Interest 0 0   Down, Depressed, Hopeless 0 0   PHQ - 2 Score 0 0   Altered sleeping 0 -   Tired, decreased energy 2 -   Change in appetite 2 -   Feeling bad or failure about yourself  0 -   Trouble concentrating 0 -   Moving slowly or fidgety/restless 0 -   Suicidal thoughts 0 -   PHQ-9 Score 4 -   Difficult doing work/chores Somewhat difficult -     Interpretation of Total Score  Total Score Depression Severity:  1-4 = Minimal depression, 5-9 = Mild depression, 10-14 = Moderate depression, 15-19 = Moderately severe depression, 20-27 = Severe depression   Psychosocial Evaluation and Intervention:     Psychosocial Evaluation - 04/10/17 1100      Psychosocial Evaluation & Interventions   Interventions Encouraged to exercise with the program and follow exercise prescription;Stress management education   Comments Counselor met with Mr. Schopf Mikki Santee) today for initial psychosocial evaluation.  He is an 83 year old who loves to travel.  He has some health issues with Asthma and Emphysema that impact that somewhat.  Mikki Santee has a strong support system with a significant other of (6) years; and daughter who lives close by; a sister-in-law locally; and active involvement in his local church; [employer] and a 73 year [employer].  Mikki Santee reports sleeping well and having a good appetite.  He denies a history of depression or anxiety or any current symptoms.  Mikki Santee states he is typically in a positive mood and other than his health issues - he has minimal stress in his life.  Mikki Santee has goals to improve his breathing and learn better ways to manage his illness while in this program.  He will be followed by staff.     Expected Outcomes Mikki Santee will benefit from consistent exercise to achieve his stated goals.  The educational and psychoeducational components of this program will help Mikki Santee learn to manage and cope  with his illnesses better.        Psychosocial Re-Evaluation:     Psychosocial Re-Evaluation    [place] Name 04/19/17 1409             Psychosocial Re-Evaluation   Current issues with None Identified;Current Stress Concerns       Comments Caedon states that his home life is great. He wants to be able to breath better and feels like the program is helping him to do so. He said he eats, sleeps, watches TV and says his relationship is great. He is traveling to [place] next month and the [place] in November.       Expected Outcomes Short: exercise to improve shortness breath to minimize stress. Long: Maintain a stress free environment at home and with his breathing.       Interventions Encouraged to attend Pulmonary Rehabilitation for the exercise;Stress management education       Continue Psychosocial Services  Follow up required by staff          Psychosocial Discharge (Final Psychosocial Re-Evaluation):     Psychosocial Re-Evaluation - 04/19/17 1409      Psychosocial Re-Evaluation   Current issues with None Identified;Current Stress Concerns   Comments Altonio states that his home life is great. He wants to be able to breath better and feels like the program  is helping him to do so. He said he eats, sleeps, watches TV and says his relationship is great. He is traveling to [place] next month and the [place] in November.   Expected Outcomes Short: exercise to improve shortness breath to minimize stress. Long: Maintain a stress free environment at home and with his breathing.   Interventions Encouraged to attend Pulmonary Rehabilitation for the exercise;Stress management education   Continue Psychosocial Services  Follow up required by staff      Education: Education Goals: Education classes will be provided on a weekly basis, covering required topics. Participant will state understanding/return demonstration of topics presented.  Learning Barriers/Preferences:      Learning Barriers/Preferences - 03/28/17 1320      Learning Barriers/Preferences   Learning Barriers None   Learning Preferences None      Education Topics: Initial Evaluation Education: - Verbal, written and demonstration of respiratory meds, RPE/PD scales, oximetry and breathing techniques. Instruction on use of nebulizers and MDIs: cleaning and proper use, rinsing mouth with steroid doses and importance of monitoring MDI activations.   Pulmonary Rehab from 04/19/2017 in [facility] Cardiac and Pulmonary Rehab  Date  03/28/17  Educator  Sb  Instruction Review Code  1- Verbalizes Understanding      General Nutrition Guidelines/Fats and Fiber: -Group instruction provided by verbal, written material, models and posters to present the general guidelines for heart healthy nutrition. Gives an explanation and review of dietary fats and fiber.   Controlling Sodium/Reading Food Labels: -Group verbal and written material supporting the discussion of sodium use in heart healthy nutrition. Review and explanation with models, verbal and written materials for utilization of the food label.   Exercise Physiology & Risk Factors: - Group verbal and written instruction with models to review the exercise physiology of the cardiovascular system and associated critical values. Details cardiovascular disease risk factors and the goals associated with each risk factor.   Aerobic Exercise & Resistance Training: - Gives group verbal and written discussion on the health impact of inactivity. On the components of aerobic and resistive training programs and the benefits of this training and how to safely progress through these programs.   Flexibility, Balance, General Exercise Guidelines: - Provides group verbal and written instruction on the benefits of flexibility and balance training programs. Provides general exercise guidelines with specific guidelines to those with heart or lung disease. Demonstration and  skill practice provided.   Stress Management: - Provides group verbal and written instruction about the health risks of elevated stress, cause of high stress, and healthy ways to reduce stress.   Pulmonary Rehab from 04/19/2017 in [facility] Cardiac and Pulmonary Rehab  Date  04/19/17  Educator  [facility]  Instruction Review Code  1- Verbalizes Understanding      Depression: - Provides group verbal and written instruction on the correlation between heart/lung disease and depressed mood, treatment options, and the stigmas associated with seeking treatment.   Exercise & Equipment Safety: - Individual verbal instruction and demonstration of equipment use and safety with use of the equipment.   Infection Prevention: - Provides verbal and written material to individual with discussion of infection control including proper hand washing and proper equipment cleaning during exercise session.   Pulmonary Rehab from 04/19/2017 in [facility] Cardiac and Pulmonary Rehab  Date  03/28/17  Educator  Sb  Instruction Review Code  1- Verbalizes Understanding      Falls Prevention: - Provides verbal and written material to individual with discussion of falls  prevention and safety.   Pulmonary Rehab from 04/19/2017 in [facility] Cardiac and Pulmonary Rehab  Date  03/28/17  Educator  SB  Instruction Review Code  1- Verbalizes Understanding      Diabetes: - Individual verbal and written instruction to review signs/symptoms of diabetes, desired ranges of glucose level fasting, after meals and with exercise. Advice that pre and post exercise glucose checks will be done for 3 sessions at entry of program.   Pulmonary Rehab from 04/19/2017 in [facility] Cardiac and Pulmonary Rehab  Date  03/28/17  Educator  Sb  Instruction Review Code  1- Verbalizes Understanding      Chronic Lung Diseases: - Group verbal and written instruction to review new updates, new respiratory medications, new advancements in procedures and treatments.  Provide informative websites and "800" numbers of self-education.   Lung Procedures: - Group verbal and written instruction to describe testing methods done to diagnose lung disease. Review the outcome of test results. Describe the treatment choices: Pulmonary Function Tests, ABGs and oximetry.   Energy Conservation: - Provide group verbal and written instruction for methods to conserve energy, plan and organize activities. Instruct on pacing techniques, use of adaptive equipment and posture/positioning to relieve shortness of breath.   Pulmonary Rehab from 04/19/2017 in [facility] Cardiac and Pulmonary Rehab  Date  04/12/17  Educator  [facility]  Instruction Review Code  1- Verbalizes Understanding      Triggers: - Group verbal and written instruction to review types of environmental controls: home humidity, furnaces, filters, dust mite/pet prevention, HEPA vacuums. To discuss weather changes, air quality and the benefits of nasal washing.   Exacerbations: - Group verbal and written instruction to provide: warning signs, infection symptoms, calling MD promptly, preventive modes, and value of vaccinations. Review: effective airway clearance, coughing and/or vibration techniques. Create an Sports administrator.   Oxygen: - Individual and group verbal and written instruction on oxygen therapy. Includes supplement oxygen, available portable oxygen systems, continuous and intermittent flow rates, oxygen safety, concentrators, and Medicare reimbursement for oxygen.   Respiratory Medications: - Group verbal and written instruction to review medications for lung disease. Drug class, frequency, complications, importance of spacers, rinsing mouth after steroid MDI's, and proper cleaning methods for nebulizers.   AED/CPR: - Group verbal and written instruction with the use of models to demonstrate the basic use of the AED with the basic ABC's of resuscitation.   Breathing Retraining: - Provides individuals verbal  and written instruction on purpose, frequency, and proper technique of diaphragmatic breathing and pursed-lipped breathing. Applies individual practice skills.   Pulmonary Rehab from 04/19/2017 in [facility] Cardiac and Pulmonary Rehab  Date  03/28/17  Educator  Sb  Instruction Review Code  1- Verbalizes Understanding      Anatomy and Physiology of the Lungs: - Group verbal and written instruction with the use of models to provide basic lung anatomy and physiology related to function, structure and complications of lung disease.   Anatomy & Physiology of the Heart: - Group verbal and written instruction and models provide basic cardiac anatomy and physiology, with the coronary electrical and arterial systems. Review of: AMI, Angina, Valve disease, Heart Failure, Cardiac Arrhythmia, Pacemakers, and the ICD.   Heart Failure: - Group verbal and written instruction on the basics of heart failure: signs/symptoms, treatments, explanation of ejection fraction, enlarged heart and cardiomyopathy.   Sleep Apnea: - Individual verbal and written instruction to review Obstructive Sleep Apnea. Review of risk factors, methods for diagnosing and types of masks and  machines for OSA.   Anxiety: - Provides group, verbal and written instruction on the correlation between heart/lung disease and anxiety, treatment options, and management of anxiety.   Relaxation: - Provides group, verbal and written instruction about the benefits of relaxation for patients with heart/lung disease. Also provides patients with examples of relaxation techniques.   Cardiac Medications: - Group verbal and written instruction to review commonly prescribed medications for heart disease. Reviews the medication, class of the drug, and side effects.   Know Your Numbers: -Group verbal and written instruction about important numbers in your health.  Review of Cholesterol, Blood Pressure, Diabetes, and BMI and the role they play in your  overall health.   Other: -Provides group and verbal instruction on various topics (see comments)    Knowledge Questionnaire Score:     Knowledge Questionnaire Score - 03/28/17 1320      Knowledge Questionnaire Score   Pre Score 6/10  reviewed correct response with Mikki Santee and he verbalized understanding of the response       Core Components/Risk Factors/Patient Goals at Admission:     Personal Goals and Risk Factors at Admission - 03/28/17 1338      Core Components/Risk Factors/Patient Goals on Admission   Improve shortness of breath with ADL's Yes   Intervention Provide education, individualized exercise plan and daily activity instruction to help decrease symptoms of SOB with activities of daily living.   Expected Outcomes Short Term: Achieves a reduction of symptoms when performing activities of daily living.  Long term goal: Improved ability to perform ADL's with less SOB   Develop more efficient breathing techniques such as purse lipped breathing and diaphragmatic breathing; and practicing self-pacing with activity Yes   Intervention Provide education, demonstration and support about specific breathing techniuqes utilized for more efficient breathing. Include techniques such as pursed lipped breathing, diaphragmatic breathing and self-pacing activity.   Expected Outcomes Short Term: Participant will be able to demonstrate and use breathing techniques as needed throughout daily activities.  Long term goal: Continued use of techniques learned to improve efficiency of breathing   Increase knowledge of respiratory medications and ability to use respiratory devices properly  Yes   Intervention Provide education and demonstration as needed of appropriate use of medications, inhalers, and oxygen therapy.   Expected Outcomes Short Term: Achieves understanding of medications use. Understands that oxygen is a medication prescribed by physician. Demonstrates appropriate use of inhaler and  oxygen therapy.  Long Term Goal: Maintains ability to utilize medications, inhalers and oxygen therapy   Diabetes Yes   Intervention Provide education about signs/symptoms and action to take for hypo/hyperglycemia.;Provide education about proper nutrition, including hydration, and aerobic/resistive exercise prescription along with prescribed medications to achieve blood glucose in normal ranges: Fasting glucose 65-99 mg/dL   Expected Outcomes Short Term: Participant verbalizes understanding of the signs/symptoms and immediate care of hyper/hypoglycemia, proper foot care and importance of medication, aerobic/resistive exercise and nutrition plan for blood glucose control.;Long Term: Attainment of HbA1C < 7%.   Hypertension Yes   Intervention Provide education on lifestyle modifcations including regular physical activity/exercise, weight management, moderate sodium restriction and increased consumption of fresh fruit, vegetables, and low fat dairy, alcohol moderation, and smoking cessation.;Monitor prescription use compliance.   Expected Outcomes Short Term: Continued assessment and intervention until BP is < 140/90m HG in hypertensive participants. < 130/857mHG in hypertensive participants with diabetes, heart failure or chronic kidney disease.;Long Term: Maintenance of blood pressure at goal levels.   Lipids Yes  Intervention Provide education and support for participant on nutrition & aerobic/resistive exercise along with prescribed medications to achieve LDL <60m, HDL >444m   Expected Outcomes Short Term: Participant states understanding of desired cholesterol values and is compliant with medications prescribed. Participant is following exercise prescription and nutrition guidelines.;Long Term: Cholesterol controlled with medications as prescribed, with individualized exercise RX and with personalized nutrition plan. Value goals: LDL < 7015mHDL > 40 mg.      Core Components/Risk Factors/Patient  Goals Review:      Goals and Risk Factor Review    Row Name 04/19/17 1355             Core Components/Risk Factors/Patient Goals Review   Personal Goals Review Weight Management/Obesity;Lipids;Hypertension;Diabetes       Review RobGutmanight has been steady and wants to lose weight. He has not met with the nutritionist. His blood pressure has been under control since the start of the program. He does not know his last lipids results. Blood sugars have been checked and within acceptable ranges. He also checks his blood sugar regularly at home.       Expected Outcomes Short: Continue to lose weight. Long: Maintain weight after weight loss           Core Components/Risk Factors/Patient Goals at Discharge (Final Review):      Goals and Risk Factor Review - 04/19/17 1355      Core Components/Risk Factors/Patient Goals Review   Personal Goals Review Weight Management/Obesity;Lipids;Hypertension;Diabetes   Review RobSwavelyight has been steady and wants to lose weight. He has not met with the nutritionist. His blood pressure has been under control since the start of the program. He does not know his last lipids results. Blood sugars have been checked and within acceptable ranges. He also checks his blood sugar regularly at home.   Expected Outcomes Short: Continue to lose weight. Long: Maintain weight after weight loss       ITP Comments:     ITP Comments    Row Name 03/28/17 1230 03/28/17 1403 04/24/17 0831       ITP Comments Medical review completed. INitial ITP created and sent to Dr MarEmily Filbertr signature and suggested changes.   [facility] Patient met with Dr. Mar[facility]ily [facility]ctor [facility] for [facility] to face contact. 30 day review completed. ITP sent to Dr. MarEmily [facility]trector of [facility] with ITP unless changes are made by physician.          Comments: 30 day review

## 2017-04-24 NOTE — Progress Notes (Signed)
Daily Session Note  Patient Details  Name: Cody Bridges MRN: [id number] Date of Birth: 08-15-1932 Referring Provider:    Encounter Date: 04/24/2017  Check In:     Session Check In - 04/24/17 1018      Check-In   Location [facility]-Cardiac & Pulmonary Rehab   Staff Present Nada Maclachlan, BA, ACSM CEP, Exercise Physiologist;Kelly Amedeo Plenty, BS, ACSM CEP, Exercise Physiologist;Peola Joynt Flavia Shipper   Supervising physician immediately available to respond to emergencies [facility] immediately available ER MD   Physician(s) Dr. Clearnce Hasten and [facility]   Medication changes reported     No   Fall or balance concerns reported    No   Warm-up and Cool-down Performed as group-led instruction   Resistance Training Performed Yes   VAD Patient? No     Pain Assessment   Currently in Pain? No/denies   Multiple Pain Sites No         History  Smoking Status  . Former Smoker  . Packs/day: 2.00  . Years: 35.00  . Types: Cigarettes  . Quit date: 10/30/1985  Smokeless Tobacco  . Never Used    Goals Met:  Independence with exercise equipment Exercise tolerated well No report of cardiac concerns or symptoms Strength training completed today  Goals Unmet:  Not Applicable  Comments: Pt able to follow exercise prescription today without complaint.  Will continue to monitor for progression.   Dr. Emily Filbert is Medical Director for [facility] and [facility] Pulmonary Rehabilitation.

## 2017-04-26 DIAGNOSIS — J449 Chronic obstructive pulmonary disease, unspecified: Secondary | ICD-10-CM

## 2017-04-29 DIAGNOSIS — Z23 Encounter for immunization: Secondary | ICD-10-CM | POA: Diagnosis not present

## 2017-05-01 ENCOUNTER — Ambulatory Visit (INDEPENDENT_AMBULATORY_CARE_PROVIDER_SITE_OTHER): Payer: Medicare Other | Admitting: Family Medicine

## 2017-05-01 ENCOUNTER — Encounter: Payer: Self-pay | Admitting: Family Medicine

## 2017-05-01 ENCOUNTER — Encounter: Payer: Medicare Other | Attending: Internal Medicine

## 2017-05-01 VITALS — BP 152/80 | HR 94 | Temp 98.5°F | Wt 213.4 lb

## 2017-05-01 DIAGNOSIS — R29898 Other symptoms and signs involving the musculoskeletal system: Secondary | ICD-10-CM

## 2017-05-01 DIAGNOSIS — E119 Type 2 diabetes mellitus without complications: Secondary | ICD-10-CM | POA: Diagnosis not present

## 2017-05-01 DIAGNOSIS — T148XXA Other injury of unspecified body region, initial encounter: Secondary | ICD-10-CM

## 2017-05-01 DIAGNOSIS — Z7984 Long term (current) use of oral hypoglycemic drugs: Secondary | ICD-10-CM | POA: Insufficient documentation

## 2017-05-01 DIAGNOSIS — R0602 Shortness of breath: Secondary | ICD-10-CM | POA: Diagnosis not present

## 2017-05-01 DIAGNOSIS — I10 Essential (primary) hypertension: Secondary | ICD-10-CM | POA: Insufficient documentation

## 2017-05-01 DIAGNOSIS — I951 Orthostatic hypotension: Secondary | ICD-10-CM | POA: Diagnosis not present

## 2017-05-01 DIAGNOSIS — J449 Chronic obstructive pulmonary disease, unspecified: Secondary | ICD-10-CM | POA: Insufficient documentation

## 2017-05-01 DIAGNOSIS — Z79899 Other long term (current) drug therapy: Secondary | ICD-10-CM | POA: Insufficient documentation

## 2017-05-01 DIAGNOSIS — Z87891 Personal history of nicotine dependence: Secondary | ICD-10-CM | POA: Insufficient documentation

## 2017-05-01 LAB — POCT GLYCOSYLATED HEMOGLOBIN (HGB A1C): Hemoglobin A1C: 6.3

## 2017-05-01 NOTE — Patient Instructions (Addendum)
Nice to see you. You need to try to stay hydrated. Please continue to monitor blood pressure and blood sugars. Please continue to follow with pulmonologist. We will get you to see physical therapy for your legs and balance.

## 2017-05-01 NOTE — Assessment & Plan Note (Signed)
Suspect related to deconditioning. Neurologically intact. We'll get him set up for physical therapy.

## 2017-05-01 NOTE — Assessment & Plan Note (Signed)
Patient orthostatic on vital signs. Suspect related to inadequate fluid intake. Encouraged fluid intake. Monitor for improvement of lightheadedness. If not improving he'll let us know and we could consider decreasing blood pressure medication.

## 2017-05-01 NOTE — Assessment & Plan Note (Signed)
Chronic stable issue. His new inhaler is somewhat helpful. He will continue to follow pulmonology.

## 2017-05-01 NOTE — Assessment & Plan Note (Signed)
A1c controlled. Foot exam completed. He'll follow with podiatry. We'll get him set up with ophthalmology for yearly eye exam.

## 2017-05-01 NOTE — Assessment & Plan Note (Addendum)
PT/INR and CBC normal previously. Suspect related to trauma. He will continue to monitor.

## 2017-05-01 NOTE — Progress Notes (Signed)
[name], MD Phone: [phone]  Cody Bridges is a 83 y.o. male who presents today for follow-up.  Diabetes: Typically around 130. Taking metformin. Some polyuria. No polydipsia. No hypoglycemia. Has not seen ophthalmology in the last year.  Emphysema: Following with pulmonology. Continues to have dyspnea. Cardiology has stated it's not cardiac in nature. Notes his symptoms are somewhat improved with his new inhaler and the rescue inhaler. He will continue to follow with pulmonology.  Patient notes for at least the last year or so his bilateral legs have felt weak particularly when he goes to stand up. He does exercise on the treadmill and elliptical. He does get lightheaded when he goes from seated to standing as well. He reports he does not drink very much water. He has not done any physical therapy.  Continues to have slight bruising just on his extremities. No bruising elsewhere. No bleeding. No bloody noses. No bleeding from his gums. No family history of bleeding.  PMH: Former smoker   ROS see history of present illness  Objective  Physical Exam Vitals:   05/01/17 0926  BP: (!) 152/80  Pulse: 94  Temp: 98.5 F (36.9 C)  SpO2: 97%   Laying blood pressure 168/64 pulse 83 Sitting blood pressure 158/90 pulse 89 Standing blood pressure 148/70 86 pulse 95  BP Readings from Last 3 Encounters:  05/01/17 (!) 152/80  03/20/17 (!) 150/88  02/22/17 117/67   Wt Readings from Last 3 Encounters:  05/01/17 213 lb 6.4 oz (96.8 kg)  03/28/17 211 lb 4.8 oz (95.8 kg)  03/20/17 216 lb (98 kg)    Physical Exam  Constitutional: No distress.  HENT:  Head: Normocephalic and atraumatic.  Cardiovascular: Normal rate, regular rhythm and normal heart sounds.   Pulmonary/Chest: Effort normal and breath sounds normal.  Musculoskeletal: He exhibits no edema.  Neurological: He is alert.  CN 2-12 intact, 5/5 strength in bilateral biceps, triceps, grip, quads, hamstrings, plantar  and dorsiflexion, sensation to light touch intact in bilateral UE and LE, normal gait, normal finger to nose, normal rapid alternating movements, normal heel-to-shin  Skin: Skin is warm and dry. He is not diaphoretic.   Diabetic Foot Exam - Simple   Simple Foot Form Diabetic Foot exam was performed with the following findings:  Yes 05/01/2017 10:19 AM  Visual Inspection See comments:  Yes Sensation Testing See comments:  Yes Pulse Check Posterior Tibialis and Dorsalis pulse intact bilaterally:  Yes Comments Patient with thickened toenails throughout bilateral feet, no other deformities, ulcerations, or skin breakdown, decreased monofilament sensation to light touch sensation intact     Assessment/Plan: Please see individual problem list.  Diabetes mellitus without complication (HCC) V8L controlled. Foot exam completed. He'll follow with podiatry. We'll get him set up with ophthalmology for yearly eye exam.  Bruising PT/INR and CBC normal previously. Suspect related to trauma. He will continue to monitor.  Weakness of both lower extremities Suspect related to deconditioning. Neurologically intact. We'll get him set up for physical therapy.  Orthostasis Patient orthostatic on vital signs. Suspect related to inadequate fluid intake. Encouraged fluid intake. Monitor for improvement of lightheadedness. If not improving he'll let us know and we could consider decreasing blood pressure medication.  SOBOE (shortness of breath on exertion) Chronic stable issue. His new inhaler is somewhat helpful. He will continue to follow pulmonology.  Patient will closely monitor his feet given decreased monofilament sensation. He'll check them daily. Advised to follow with podiatry as well.  Orders Placed This Encounter  Procedures  . Ambulatory referral to Physical Therapy    Referral Priority:   Routine    Referral Type:   Physical Medicine    Referral Reason:   Specialty Services Required     Requested Specialty:   Physical Therapy    Number of Visits Requested:   1  . Ambulatory referral to Ophthalmology    Referral Priority:   Routine    Referral Type:   Consultation    Referral Reason:   Specialty Services Required    Requested Specialty:   Ophthalmology    Number of Visits Requested:   1  . POCT HgB A1C    [name], MD [place]

## 2017-05-03 ENCOUNTER — Encounter: Payer: Self-pay | Admitting: Family Medicine

## 2017-05-03 DIAGNOSIS — J449 Chronic obstructive pulmonary disease, unspecified: Secondary | ICD-10-CM

## 2017-05-03 DIAGNOSIS — Z7984 Long term (current) use of oral hypoglycemic drugs: Secondary | ICD-10-CM | POA: Diagnosis not present

## 2017-05-03 DIAGNOSIS — Z79899 Other long term (current) drug therapy: Secondary | ICD-10-CM | POA: Diagnosis not present

## 2017-05-03 DIAGNOSIS — I10 Essential (primary) hypertension: Secondary | ICD-10-CM | POA: Diagnosis not present

## 2017-05-03 DIAGNOSIS — E119 Type 2 diabetes mellitus without complications: Secondary | ICD-10-CM | POA: Diagnosis not present

## 2017-05-03 DIAGNOSIS — Z87891 Personal history of nicotine dependence: Secondary | ICD-10-CM | POA: Diagnosis not present

## 2017-05-03 NOTE — Progress Notes (Signed)
Daily Session Note  Patient Details  Name: Cody Bridges MRN: [id number] Date of Birth: [birth year] Referring Provider:    Encounter Date: 05/03/2017  Check In:     Session Check In - 05/03/17 1025      Check-In   Location [facility]-Cardiac & Pulmonary Rehab   Staff Present Alberteen Sam, MA, ACSM RCEP, Exercise Physiologist;Amanda Oletta Darter, BA, ACSM CEP, Exercise Physiologist;Reighlyn Elmes Flavia Shipper   Supervising physician immediately available to respond to emergencies [facility] immediately available ER MD   Physician(s) Dr. Clearnce Hasten and Jimmye Norman   Medication changes reported     No   Fall or balance concerns reported    No   Warm-up and Cool-down Performed as group-led instruction   Resistance Training Performed Yes   VAD Patient? No     Pain Assessment   Currently in Pain? No/denies   Multiple Pain Sites No         History  Smoking Status  . Former Smoker  . Packs/day: 2.00  . Years: 35.00  . Types: Cigarettes  . Quit date: 10/30/1985  Smokeless Tobacco  . Never Used    Goals Met:  Independence with exercise equipment Exercise tolerated well No report of cardiac concerns or symptoms Strength training completed today  Goals Unmet:  Not Applicable  Comments: Reviewed home exercise with pt today.  Pt plans to use the fitness center at his appartment for exercise.  Reviewed THR, pulse, RPE, sign and symptoms, NTG use, and when to call 911 or MD.  Also discussed weather considerations and indoor options.  Pt voiced understanding.Pt able to follow exercise prescription today without complaint.  Will continue to monitor for progression.   Dr. Emily Filbert is Medical Director for [facility] and [facility]monary [facility].

## 2017-05-08 DIAGNOSIS — Z7984 Long term (current) use of oral hypoglycemic drugs: Secondary | ICD-10-CM | POA: Diagnosis not present

## 2017-05-08 DIAGNOSIS — J449 Chronic obstructive pulmonary disease, unspecified: Secondary | ICD-10-CM

## 2017-05-08 DIAGNOSIS — E119 Type 2 diabetes mellitus without complications: Secondary | ICD-10-CM | POA: Diagnosis not present

## 2017-05-08 DIAGNOSIS — Z87891 Personal history of nicotine dependence: Secondary | ICD-10-CM | POA: Diagnosis not present

## 2017-05-08 DIAGNOSIS — Z79899 Other long term (current) drug therapy: Secondary | ICD-10-CM | POA: Diagnosis not present

## 2017-05-08 DIAGNOSIS — I10 Essential (primary) hypertension: Secondary | ICD-10-CM | POA: Diagnosis not present

## 2017-05-08 NOTE — Progress Notes (Signed)
Daily Session Note  Patient Details  Name: Tadarius Maland [place] MRN: [id number] Date of Birth: 20-Oct-1932 Referring Provider:    Encounter Date: 05/08/2017  Check In:     Session Check In - 05/08/17 0949      Check-In   Location [facility]   Staff Present Nada Maclachlan, BA, ACSM CEP, Exercise Physiologist;Kelly Amedeo Plenty, BS, ACSM CEP, Exercise Physiologist;Almeda Ezra Flavia Shipper   Supervising physician immediately available to respond to emergencies [facility] immediately available ER MD   Physician(s) Dr. Jimmye Norman and [facility]   Medication changes reported     No   Fall or balance concerns reported    No   Warm-up and Cool-down Performed as group-led instruction   Resistance Training Performed Yes   VAD Patient? No     Pain Assessment   Currently in Pain? No/denies   Multiple Pain Sites No         History  Smoking Status  . Former Smoker  . Packs/day: 2.00  . Years: 35.00  . Types: Cigarettes  . Quit date: 10/30/1985  Smokeless Tobacco  . Never Used    Goals Met:  Independence with exercise equipment Exercise tolerated well No report of cardiac concerns or symptoms Strength training completed today  Goals Unmet:  Not Applicable  Comments: Pt able to follow exercise prescription today without complaint.  Will continue to monitor for progression.   Dr. Emily Filbert is Medical Director for [place] and [facility].

## 2017-05-15 DIAGNOSIS — E119 Type 2 diabetes mellitus without complications: Secondary | ICD-10-CM | POA: Diagnosis not present

## 2017-05-15 DIAGNOSIS — J449 Chronic obstructive pulmonary disease, unspecified: Secondary | ICD-10-CM | POA: Diagnosis not present

## 2017-05-15 DIAGNOSIS — Z87891 Personal history of nicotine dependence: Secondary | ICD-10-CM | POA: Diagnosis not present

## 2017-05-15 DIAGNOSIS — Z7984 Long term (current) use of oral hypoglycemic drugs: Secondary | ICD-10-CM | POA: Diagnosis not present

## 2017-05-15 DIAGNOSIS — I10 Essential (primary) hypertension: Secondary | ICD-10-CM | POA: Diagnosis not present

## 2017-05-15 DIAGNOSIS — Z79899 Other long term (current) drug therapy: Secondary | ICD-10-CM | POA: Diagnosis not present

## 2017-05-15 NOTE — Progress Notes (Signed)
Daily Session Note  Patient Details  Name: Cody Bridges MRN: [id number] Date of Birth: 10-27-1932 Referring Provider:    Encounter Date: 05/15/2017  Check In:     Session Check In - 05/15/17 1013      Check-In   Location [facility]   Staff Present Earlean Shawl, BS, ACSM CEP, Exercise Physiologist;Laureen Owens Shark, BS, RRT, Respiratory Therapist;Aayansh Codispoti [place] physician immediately available to respond to emergencies [facility] immediately available ER MD   Physician(s) Dr. Cherylann Banas and Corky Downs   Medication changes reported     No   Fall or balance concerns reported    No   Warm-up and Cool-down Performed as group-led instruction   Resistance Training Performed Yes   VAD Patient? No     Pain Assessment   Currently in Pain? No/denies   Multiple Pain Sites No         History  Smoking Status  . Former Smoker  . Packs/day: 2.00  . Years: 35.00  . Types: Cigarettes  . Quit date: 10/30/1985  Smokeless Tobacco  . Never Used    Goals Met:  Proper associated with RPD/PD & O2 Sat Independence with exercise equipment Exercise tolerated well No report of cardiac concerns or symptoms Strength training completed today  Goals Unmet:  Not Applicable  Comments: Pt able to follow exercise prescription today without complaint.  Will continue to monitor for progression.   Dr. Emily Filbert is Medical Director for [place] and [facility].

## 2017-05-22 DIAGNOSIS — Z87891 Personal history of nicotine dependence: Secondary | ICD-10-CM | POA: Diagnosis not present

## 2017-05-22 DIAGNOSIS — Z79899 Other long term (current) drug therapy: Secondary | ICD-10-CM | POA: Diagnosis not present

## 2017-05-22 DIAGNOSIS — Z7984 Long term (current) use of oral hypoglycemic drugs: Secondary | ICD-10-CM | POA: Diagnosis not present

## 2017-05-22 DIAGNOSIS — E119 Type 2 diabetes mellitus without complications: Secondary | ICD-10-CM | POA: Diagnosis not present

## 2017-05-22 DIAGNOSIS — J449 Chronic obstructive pulmonary disease, unspecified: Secondary | ICD-10-CM | POA: Diagnosis not present

## 2017-05-22 DIAGNOSIS — I10 Essential (primary) hypertension: Secondary | ICD-10-CM | POA: Diagnosis not present

## 2017-05-22 NOTE — Progress Notes (Signed)
Daily Session Note  Patient Details  Name: Haskel Dewalt Zettel MRN: [id number] Date of Birth: 1933-06-11 Referring Provider:    Encounter Date: 05/22/2017  Check In:     Session Check In - 05/22/17 1019      Check-In   Location [facility]   Staff Present Nada Maclachlan, BA, ACSM CEP, Exercise Physiologist;Kelly Amedeo Plenty, BS, ACSM CEP, Exercise Physiologist;Chandani Rogowski Flavia Shipper   Supervising physician immediately available to respond to emergencies [facility] immediately available ER MD   Physician(s) Dr. Kerman Passey and Mariea Clonts   Medication changes reported     No   Fall or balance concerns reported    No   Warm-up and Cool-down Performed as group-led instruction   Resistance Training Performed Yes   VAD Patient? No     Pain Assessment   Currently in Pain? No/denies   Multiple Pain Sites No         History  Smoking Status  . Former Smoker  . Packs/day: 2.00  . Years: 35.00  . Types: Cigarettes  . Quit date: 10/30/1985  Smokeless Tobacco  . Never Used    Goals Met:  Independence with exercise equipment Exercise tolerated well No report of cardiac concerns or symptoms Strength training completed today  Goals Unmet:  Not Applicable  Comments: Pt able to follow exercise prescription today without complaint.  Will continue to monitor for progression.   Dr. Emily Filbert is Medical Director for [place] and [facility].

## 2017-05-22 NOTE — Progress Notes (Signed)
Pulmonary Individual Treatment Plan  Patient Details  Name: Cody Bridges MRN: [id number] Date of Birth: 1933-06-07 Referring Provider:    Initial Encounter Date:    Pulmonary Rehab from 03/28/2017 in [facility] Cardiac and Pulmonary Rehab  Date  03/28/17      Visit Diagnosis: Chronic obstructive pulmonary disease, unspecified COPD type ([place])  Patient's Home Medications on Admission:  Current Outpatient Prescriptions:  .  ACCU-CHEK AVIVA test strip, TEST EVERY DAY, Disp: , Rfl: 5 .  ACCU-CHEK SOFTCLIX LANCETS lancets, daily., Disp: , Rfl: 5 .  calcium carbonate (TUMS - DOSED IN MG ELEMENTAL CALCIUM) 500 MG chewable tablet, Chew 1-2 tablets by mouth 3 (three) times daily as needed (for acid reflux/indigestion.)., Disp: , Rfl:  .  Cholecalciferol (VITAMIN D-3) 5000 units TABS, Take 5,000 Units by mouth daily., Disp: , Rfl:  .  Fluticasone-Umeclidin-Vilant (TRELEGY ELLIPTA) 100-62.5-25 MCG/INH AEPB, Inhale 1 puff into the lungs daily., Disp: 60 each, Rfl: 5 .  hydrochlorothiazide (HYDRODIURIL) 25 MG tablet, Take 25 mg by mouth daily., Disp: , Rfl:  .  lisinopril (PRINIVIL,ZESTRIL) 5 MG tablet, Take 5 mg by mouth daily., Disp: , Rfl:  .  metFORMIN (GLUCOPHAGE) 500 MG tablet, Take 500 mg by mouth 2 (two) times daily., Disp: , Rfl:  .  Multiple Vitamin (MULTIVITAMIN WITH MINERALS) TABS tablet, Take 1 tablet by mouth daily., Disp: , Rfl:  .  PROAIR HFA 108 (90 Base) MCG/ACT inhaler, Inhale 1-2 puffs into the lungs every 6 (six) hours as needed (for wheezing/shortness of breath). , Disp: , Rfl:  .  rosuvastatin (CRESTOR) 20 MG tablet, Take 20 mg by mouth daily., Disp: , Rfl:  .  triamcinolone ointment (KENALOG) 0.1 %, Apply 1 application topically 2 (two) times daily as needed (for dry skin/irritation.)., Disp: , Rfl:   Past Medical History: Past Medical History:  Diagnosis Date  . Arthritis   . Asthma   . Diabetes mellitus without complication ([place])     8182  . Dyspnea    with exertion  .  GERD (gastroesophageal reflux disease)   . Hyperlipidemia   . Hypertension     Tobacco Use: History  Smoking Status  . Former Smoker  . Packs/day: 2.00  . Years: 35.00  . Types: Cigarettes  . Quit date: 10/30/1985  Smokeless Tobacco  . Never Used    Labs: Recent Review Flowsheet Data    Labs for ITP Cardiac and Pulmonary Rehab 10/24/2016 01/26/2017 05/01/2017   Hemoglobin A1c 6.8(H) 6.5 6.3       Pulmonary Assessment Scores:     Pulmonary Assessment Scores    Row Name 03/28/17 1330         ADL UCSD   ADL Phase Entry     SOB Score total 76     Rest 2     Walk 3     Stairs 4     Bath 2     Dress 2     Shop 3       CAT Score   CAT Score 25       mMRC Score   mMRC Score 1        Pulmonary Function Assessment:     Pulmonary Function Assessment - 03/28/17 1348      Pulmonary Function Tests   FVC% 78 %  Test performed 12/15/2016   FEV1% 59 %   FEV1/FVC Ratio 59     Breath   Shortness of Breath Yes;Fear of Shortness of Breath;Limiting activity  Exercise Target Goals:    Exercise Program Goal: Individual exercise prescription set with THRR, safety & activity barriers. Participant demonstrates ability to understand and report RPE using BORG scale, to self-measure pulse accurately, and to acknowledge the importance of the exercise prescription.  Exercise Prescription Goal: Starting with aerobic activity 30 plus minutes a day, 3 days per week for initial exercise prescription. Provide home exercise prescription and guidelines that participant acknowledges understanding prior to discharge.  Activity Barriers & Risk Stratification:     Activity Barriers & Cardiac Risk Stratification - 03/28/17 1236      Activity Barriers & Cardiac Risk Stratification   Activity Barriers Arthritis;Back Problems;Shortness of Breath;Muscular Weakness  Low back pain, arthritis hands and other joints, legs weak, SOB from chronic lung disease      6 Minute Walk:      6 Minute Walk    Row Name 03/28/17 1332         6 Minute Walk   Distance 950 feet     Walk Time 6 minutes     # of Rest Breaks 0     MPH 1.79     METS 1.86     RPE 15     Perceived Dyspnea  2     VO2 Peak 6.5     Symptoms Yes (comment)     Comments back pain 7/10     Resting HR 95 bpm     Resting BP 122/68     Resting Oxygen Saturation  96 %     Exercise Oxygen Saturation  during 6 min walk 95 %     Max Ex. HR 132 bpm     Max Ex. BP 132/56     2 Minute Post BP 116/70       Interval HR   1 Minute HR 114     2 Minute HR 120     3 Minute HR 132     4 Minute HR 124     5 Minute HR 121     6 Minute HR 123     2 Minute Post HR 85     Interval Heart Rate? Yes       Interval Oxygen   Interval Oxygen? Yes     Baseline Oxygen Saturation % 96 %     1 Minute Oxygen Saturation % 95 %     1 Minute Liters of Oxygen 0 L     2 Minute Oxygen Saturation % 95 %     2 Minute Liters of Oxygen 0 L     3 Minute Oxygen Saturation % 95 %     3 Minute Liters of Oxygen 0 L     4 Minute Oxygen Saturation % 95 %     4 Minute Liters of Oxygen 0 L     5 Minute Oxygen Saturation % 96 %     5 Minute Liters of Oxygen 0 L     6 Minute Oxygen Saturation % 96 %     6 Minute Liters of Oxygen 0 L     2 Minute Post Oxygen Saturation % 97 %     2 Minute Post Liters of Oxygen 0 L       Oxygen Initial Assessment:     Oxygen Initial Assessment - 03/28/17 1231      Home Oxygen   Home Oxygen Device None   Sleep Oxygen Prescription None   Home Exercise Oxygen Prescription None   Home at  Rest Exercise Oxygen Prescription None     Initial 6 min Walk   Oxygen Used None     Program Oxygen Prescription   Program Oxygen Prescription None     Intervention   Short Term Goals To learn and understand importance of monitoring SPO2 with pulse oximeter and demonstrate accurate use of the pulse oximeter.;To learn and demonstrate proper pursed lip breathing techniques or other breathing techniques.;To  learn and understand importance of maintaining oxygen saturations>88%;To learn and demonstrate proper use of respiratory medications   Long  Term Goals Verbalizes importance of monitoring SPO2 with pulse oximeter and return demonstration;Exhibits proper breathing techniques, such as pursed lip breathing or other method taught during program session;Demonstrates proper use of MDI's;Compliance with respiratory medication;Maintenance of O2 saturations>88%      Oxygen Re-Evaluation:     Oxygen Re-Evaluation    Row Name 04/10/17 1040 04/19/17 1349 05/15/17 1626         Program Oxygen Prescription   Program Oxygen Prescription  - None None       Home Oxygen   Home Oxygen Device None None None     Sleep Oxygen Prescription None None None     Home Exercise Oxygen Prescription None None None     Home at Rest Exercise Oxygen Prescription None None None       Goals/Expected Outcomes   Short Term Goals To learn and understand importance of monitoring SPO2 with pulse oximeter and demonstrate accurate use of the pulse oximeter.;To learn and demonstrate proper pursed lip breathing techniques or other breathing techniques.;To learn and understand importance of maintaining oxygen saturations>88%;To learn and demonstrate proper use of respiratory medications To learn and understand importance of maintaining oxygen saturations>88%;To learn and demonstrate proper use of respiratory medications;To learn and demonstrate proper pursed lip breathing techniques or other breathing techniques.;To learn and understand importance of monitoring SPO2 with pulse oximeter and demonstrate accurate use of the pulse oximeter. To learn and understand importance of maintaining oxygen saturations>88%;To learn and demonstrate proper pursed lip breathing techniques or other breathing techniques.;To learn and understand importance of monitoring SPO2 with pulse oximeter and demonstrate accurate use of the pulse oximeter.;To learn  and demonstrate proper use of respiratory medications     Long  Term Goals Maintenance of O2 saturations>88%;Compliance with respiratory medication;Verbalizes importance of monitoring SPO2 with pulse oximeter and return demonstration;Exhibits proper breathing techniques, such as pursed lip breathing or other method taught during program session;Demonstrates proper use of MDI's Verbalizes importance of monitoring SPO2 with pulse oximeter and return demonstration;Exhibits proper breathing techniques, such as pursed lip breathing or other method taught during program session;Demonstrates proper use of MDI's;Compliance with respiratory medication;Maintenance of O2 saturations>88% Maintenance of O2 saturations>88%;Compliance with respiratory medication;Demonstrates proper use of MDI's;Exhibits proper breathing techniques, such as pursed lip breathing or other method taught during program session;Verbalizes importance of monitoring SPO2 with pulse oximeter and return demonstration     Comments Reviewed PLB technique with pt.  Talked about how it work and it's important to maintaining his exercise saturations.   Informed Arye about the effects of COPD, He does not have a pulse oximeter at home to check his oxygen. Informed him how to obtain one and to make sure his oxygen is above 88 percent. He needs alot of imrpovement on PLB techniques. Rishabh states that he checks his blood  pressure and his heart rate at home regularly. He does not show an interest in purchasing a pulse oximeter to check his oxygen saturation. He does  not want to use a spacer for his inhalers either. His oxygen levels have been above 88 percent while exercising.  His PLB has got better but still could use practice.     Goals/Expected Outcomes Short: Become more profiecient at using PLB.   Long: Become independent at using PLB. Short:obtatin a pulse oximeter and work on PLB. Long: Check and maintain an oxygen saturation greater than 88 precent. Be  proficient with PLB Short: convince Jackey to use a spacer with his medications. Long: Independently use a spacer with his medications        Oxygen Discharge (Final Oxygen Re-Evaluation):     Oxygen Re-Evaluation - 05/15/17 1626      Program Oxygen Prescription   Program Oxygen Prescription None     Home Oxygen   Home Oxygen Device None   Sleep Oxygen Prescription None   Home Exercise Oxygen Prescription None   Home at Rest Exercise Oxygen Prescription None     Goals/Expected Outcomes   Short Term Goals To learn and understand importance of maintaining oxygen saturations>88%;To learn and demonstrate proper pursed lip breathing techniques or other breathing techniques.;To learn and understand importance of monitoring SPO2 with pulse oximeter and demonstrate accurate use of the pulse oximeter.;To learn and demonstrate proper use of respiratory medications   Long  Term Goals Maintenance of O2 saturations>88%;Compliance with respiratory medication;Demonstrates proper use of MDI's;Exhibits proper breathing techniques, such as pursed lip breathing or other method taught during program session;Verbalizes importance of monitoring SPO2 with pulse oximeter and return demonstration   Comments Jiovany states that he checks his blood  pressure and his heart rate at home regularly. He does not show an interest in purchasing a pulse oximeter to check his oxygen saturation. He does not want to use a spacer for his inhalers either. His oxygen levels have been above 88 percent while exercising.  His PLB has got better but still could use practice.   Goals/Expected Outcomes Short: convince Mansoor to use a spacer with his medications. Long: Independently use a spacer with his medications      Initial Exercise Prescription:     Initial Exercise Prescription - 03/28/17 1300      Date of Initial Exercise RX and Referring Provider   Date 03/28/17     Treadmill   MPH 1.5   Grade 0   Minutes 15   METs  2.15     Recumbant Bike   Level 1   RPM 60   Watts 5   Minutes 15   METs 1.8     NuStep   Level 2   SPM 80   Minutes 15   METs 2     Arm Ergometer   Level 1   RPM 50   Minutes 15   METs 1.6     Prescription Details   Frequency (times per week) 3   Duration Progress to 45 minutes of aerobic exercise without signs/symptoms of physical distress     Intensity   THRR 40-80% of Max Heartrate 112-129   Ratings of Perceived Exertion 11-15   Perceived Dyspnea 0-4     Progression   Progression Continue to progress workloads to maintain intensity without signs/symptoms of physical distress.     Resistance Training   Training Prescription Yes   Weight 3   Reps 10-15      Perform Capillary Blood Glucose checks as needed.  Exercise Prescription Changes:     Exercise Prescription Changes    Row Name 03/28/17  1300 04/12/17 1500 04/26/17 1500 05/03/17 1400 05/10/17 1000     Response to Exercise   Blood Pressure (Admit) 122/68 132/64  -  - 124/64   Blood Pressure (Exercise) 132/56  -  -  -  -   Blood Pressure (Exit) 116/70 128/68 132/84  -  -   Heart Rate (Admit) 95 bpm 95 bpm  -  - 98 bpm   Heart Rate (Exercise) 132 bpm 96 bpm 121 bpm  - 123 bpm   Heart Rate (Exit) 85 bpm 56 bpm 99 bpm  -  -   Oxygen Saturation (Admit) 96 % 95 % 97 %  - 94 %   Oxygen Saturation (Exercise) 95 % 95 % 95 %  - 95 %   Oxygen Saturation (Exit) 97 % 97 % 95 %  -  -   Rating of Perceived Exertion (Exercise) '15 15 15  ' - 14   Perceived Dyspnea (Exercise) '2 3 3  ' - 3   Symptoms back pain 7/10 none none  - none   Duration Progress to 45 minutes of aerobic exercise without signs/symptoms of physical distress Progress to 45 minutes of aerobic exercise without signs/symptoms of physical distress Progress to 45 minutes of aerobic exercise without signs/symptoms of physical distress  - Continue with 45 min of aerobic exercise without signs/symptoms of physical distress.   Intensity  -  -  -  - THRR  unchanged     Progression   Progression  - Continue to progress workloads to maintain intensity without signs/symptoms of physical distress. Continue to progress workloads to maintain intensity without signs/symptoms of physical distress. Continue to progress workloads to maintain intensity without signs/symptoms of physical distress. Continue to progress workloads to maintain intensity without signs/symptoms of physical distress.   Average METs  - 1.'9 2 2 2     ' Resistance Training   Training Prescription  - Yes Yes Yes Yes   Weight  - '3 3 3 3   ' Reps  - 10-15 10-15 10-15 10-15     Interval Training   Interval Training  - No No No No     Treadmill   MPH  -  - 1.5 1.5 1.5   Grade  -  - 0 0 0   Minutes  -  - '15 15 15   ' METs  -  - 2.15 2.15 2.15     NuStep   Level  - '2 2 2 2   ' SPM  - 72 72 72 69   Minutes  - '15 15 15 15   ' METs  - '2 2 2 2     ' Arm Ergometer   Level  - '1 1 1 ' 1.2   RPM  - 43 41 41  -   Minutes  - '15 15 15 15   ' METs  - 1.'8 2 2  ' -     Home Exercise Plan   Plans to continue exercise at  -  -  - Home (comment)  at his appartment complex  -   Frequency  -  -  - Add 1 additional day to program exercise sessions.  -   Initial Home Exercises Provided  -  -  - 05/03/17  -      Exercise Comments:     Exercise Comments    Row Name 04/10/17 1046 05/03/17 1453         Exercise Comments First full day of exercise!  Patient was oriented to gym  and equipment including functions, settings, policies, and procedures.  Patient's individual exercise prescription and treatment plan were reviewed.  All starting workloads were established based on the results of the 6 minute walk test done at initial orientation visit.  The plan for exercise progression was also introduced and progression will be customized based on patient's performance and goals. Reviewed home exercise with pt today.  Pt plans to use the fitness center at his appartment for exercise.  Reviewed THR, pulse, RPE, sign  and symptoms, NTG use, and when to call 911 or MD.  Also discussed weather considerations and indoor options.  Pt voiced understanding.         Exercise Goals and Review:     Exercise Goals    Row Name 03/28/17 1336             Exercise Goals   Increase Physical Activity Yes       Intervention Provide advice, education, support and counseling about physical activity/exercise needs.;Develop an individualized exercise prescription for aerobic and resistive training based on initial evaluation findings, risk stratification, comorbidities and participant's personal goals.       Expected Outcomes Achievement of increased cardiorespiratory fitness and enhanced flexibility, muscular endurance and strength shown through measurements of functional capacity and personal statement of participant.       Increase Strength and Stamina Yes       Intervention Provide advice, education, support and counseling about physical activity/exercise needs.;Develop an individualized exercise prescription for aerobic and resistive training based on initial evaluation findings, risk stratification, comorbidities and participant's personal goals.       Expected Outcomes Achievement of increased cardiorespiratory fitness and enhanced flexibility, muscular endurance and strength shown through measurements of functional capacity and personal statement of participant.       Able to understand and use rate of perceived exertion (RPE) scale Yes       Intervention Provide education and explanation on how to use RPE scale       Expected Outcomes Short Term: Able to use RPE daily in rehab to express subjective intensity level;Long Term:  Able to use RPE to guide intensity level when exercising independently       Able to understand and use Dyspnea scale Yes       Intervention Provide education and explanation on how to use Dyspnea scale       Expected Outcomes Short Term: Able to use Dyspnea scale daily in rehab to express  subjective sense of shortness of breath during exertion;Long Term: Able to use Dyspnea scale to guide intensity level when exercising independently       Knowledge and understanding of Target Heart Rate Range (THRR) Yes       Intervention Provide education and explanation of THRR including how the numbers were predicted and where they are located for reference       Expected Outcomes Short Term: Able to state/look up THRR;Long Term: Able to use THRR to govern intensity when exercising independently;Short Term: Able to use daily as guideline for intensity in rehab       Able to check pulse independently Yes       Intervention Provide education and demonstration on how to check pulse in carotid and radial arteries.;Review the importance of being able to check your own pulse for safety during independent exercise       Expected Outcomes Short Term: Able to explain why pulse checking is important during independent exercise;Long Term: Able to check pulse independently and  accurately       Understanding of Exercise Prescription Yes       Intervention Provide education, explanation, and written materials on patient's individual exercise prescription       Expected Outcomes Short Term: Able to explain program exercise prescription;Long Term: Able to explain home exercise prescription to exercise independently          Exercise Goals Re-Evaluation :     Exercise Goals Re-Evaluation    Row Name 04/12/17 1504 04/26/17 1544 05/03/17 1454 05/10/17 1058       Exercise Goal Re-Evaluation   Exercise Goals Review Increase Physical Activity;Increase Strength and Stamina Increase Physical Activity;Increase Strength and Stamina Increase Physical Activity;Increase Strength and Stamina;Able to understand and use rate of perceived exertion (RPE) scale;Able to check pulse independently;Knowledge and understanding of Target Heart Rate Range (THRR);Understanding of Exercise Prescription;Able to understand and use  Dyspnea scale Increase Physical Activity;Increase Strength and Stamina;Understanding of Exercise Prescription    Comments Mr Kallal has tolerated exercise well.  He had some muscle soreness after his first session. Mikki Santee has tolerated exercise well.  He needs to attend 2-3 days per week to improve.  Staff will remind him to attend regularly. Reviewed home exercise with pt today.  Pt plans to use the fitness center at his appartment for exercise.  Reviewed THR, pulse, RPE, sign and symptoms, NTG use, and when to call 911 or MD.  Also discussed weather considerations and indoor options.  Pt voiced understanding. Mikki Santee has sporadic attendance at class.  He would see better results with regular attendance 2-3 times per week.  Staff will remind Mikki Santee of the benefits of regular exercise.    Expected Outcomes Short - Mr Beckner will continue to attend regularly.  Long - Mr Gargus will progress with exercise. Short - Mikki Santee will attend 2-3 days per week.  Long - Mikki Santee will see imporvement in overall MET and fitness level. Short: add 1 day of exercise to his daily routine. Long: Increase number of days for exercise. Short - Mikki Santee will attend at leat 2 times per week.  Long - Mikki Santee will progress more with regular attendance.       Discharge Exercise Prescription (Final Exercise Prescription Changes):     Exercise Prescription Changes - 05/10/17 1000      Response to Exercise   Blood Pressure (Admit) 124/64   Heart Rate (Admit) 98 bpm   Heart Rate (Exercise) 123 bpm   Oxygen Saturation (Admit) 94 %   Oxygen Saturation (Exercise) 95 %   Rating of Perceived Exertion (Exercise) 14   Perceived Dyspnea (Exercise) 3   Symptoms none   Duration Continue with 45 min of aerobic exercise without signs/symptoms of physical distress.   Intensity THRR unchanged     Progression   Progression Continue to progress workloads to maintain intensity without signs/symptoms of physical distress.   Average METs 2     Resistance Training    Training Prescription Yes   Weight 3   Reps 10-15     Interval Training   Interval Training No     Treadmill   MPH 1.5   Grade 0   Minutes 15   METs 2.15     NuStep   Level 2   SPM 69   Minutes 15   METs 2     Arm Ergometer   Level 1.2   Minutes 15      Nutrition:  Target Goals: Understanding of nutrition guidelines, daily intake of sodium <1585m,  cholesterol <221m, calories 30% from fat and 7% or less from saturated fats, daily to have 5 or more servings of fruits and vegetables.  Biometrics:     Pre Biometrics - 03/28/17 1330      Pre Biometrics   Height 5' 10.5" (1.791 m)   Weight 211 lb 4.8 oz (95.8 kg)   Waist Circumference 46 inches   Hip Circumference 44.5 inches   Waist to Hip Ratio 1.03 %   BMI (Calculated) 29.88       Nutrition Therapy Plan and Nutrition Goals:     Nutrition Therapy & Goals - 05/15/17 1631      Nutrition Therapy   RD appointment defered Yes      Nutrition Discharge: Rate Your Plate Scores:   Nutrition Goals Re-Evaluation:     Nutrition Goals Re-Evaluation    RArden-ArcadeName 04/19/17 1403 05/15/17 1631           Goals   Current Weight 215 lb (97.5 kg) 217 lb (98.4 kg)      Nutrition Goal Patient declined to see the nutritionist Lose weight and increase strength and stamina.      Comment Roberts weight will continued to be monitored and inform him that he can meet with a dietician is he wants to. RMadisonhas not lost weight but has gained a few pounds due to [place] He does not want to meet with the dietician.      Expected Outcome Short: lose weight using exercise. Long: Maintain weightloss with exercise at home. Short: Lose a few pounds before his next vacation. Long; maintain a nutrition and weightloss program.         Nutrition Goals Discharge (Final Nutrition Goals Re-Evaluation):     Nutrition Goals Re-Evaluation - 05/15/17 1631      Goals   Current Weight 217 lb (98.4 kg)   Nutrition Goal Lose weight and  increase strength and stamina.   Comment RMeilechhas not lost weight but has gained a few pounds due to [place] He does not want to meet with the dietician.   Expected Outcome Short: Lose a few pounds before his next vacation. Long; maintain a nutrition and weightloss program.      Psychosocial: Target Goals: Acknowledge presence or absence of significant depression and/or stress, maximize coping skills, provide positive support system. Participant is able to verbalize types and ability to use techniques and skills needed for reducing stress and depression.   Initial Review & Psychosocial Screening:     Initial Psych Review & Screening - 03/28/17 1239      Initial Review   Current issues with None Identified  HAd depression when told he possible had cancer.  CAncer was ruled out with a biospy and depression is gone.      Family Dynamics   Good Support System? Yes  VMateo Flow his significant other     Barriers   Psychosocial barriers to participate in program There are no identifiable barriers or psychosocial needs.;The patient should benefit from training in stress management and relaxation.     Screening Interventions   Interventions Encouraged to exercise;Provide feedback about the scores to participant;To provide support and resources with identified psychosocial needs      Quality of Life Scores:   PHQ-9: Recent Review Flowsheet Data    [facility] 03/28/2017 01/26/2017   Decreased Interest 0 0   Down, Depressed, Hopeless 0 0   PHQ - 2 Score 0 0   Altered sleeping 0 -  Tired, decreased energy 2 -   Change in appetite 2 -   Feeling bad or failure about yourself  0 -   Trouble concentrating 0 -   Moving slowly or fidgety/restless 0 -   Suicidal thoughts 0 -   PHQ-9 Score 4 -   Difficult doing work/chores Somewhat difficult -     Interpretation of Total Score  Total Score Depression Severity:  1-4 = Minimal depression, 5-9 = Mild depression, 10-14 =  Moderate depression, 15-19 = Moderately severe depression, 20-27 = Severe depression   Psychosocial Evaluation and Intervention:     Psychosocial Evaluation - 04/10/17 1100      Psychosocial Evaluation & Interventions   Interventions Encouraged to exercise with the program and follow exercise prescription;Stress management education   Comments Counselor met with Mr. Depree Mikki Santee) today for initial psychosocial evaluation.  He is an 83 year old who loves to travel.  He has some health issues with Asthma and Emphysema that impact that somewhat.  Mikki Santee has a strong support system with a significant other of (6) years; and daughter who lives close by; a sister-in-law locally; and active involvement in his local church; [employer] and a 49 year [employer].  Mikki Santee reports sleeping well and having a good appetite.  He denies a history of depression or anxiety or any current symptoms.  Mikki Santee states he is typically in a positive mood and other than his health issues - he has minimal stress in his life.  Mikki Santee has goals to improve his breathing and learn better ways to manage his illness while in this program.  He will be followed by staff.     Expected Outcomes Mikki Santee will benefit from consistent exercise to achieve his stated goals.  The educational and psychoeducational components of this program will help Mikki Santee learn to manage and cope with his illnesses better.        Psychosocial Re-Evaluation:     Psychosocial Re-Evaluation    [place] Common Name 04/19/17 1409 05/15/17 1635           Psychosocial Re-Evaluation   Current issues with None Identified;Current Stress Concerns None Identified;Current Stress Concerns      Comments Orestes states that his home life is great. He wants to be able to breath better and feels like the program is helping him to do so. He said he eats, sleeps, watches TV and says his relationship is great. He is traveling to [place] next month and the [place] in November. 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 He states his relationship with his girlfriend is going very well and they have been togehter for 6 years. His shortness of breath and his chronic illness seems to be his only stressor.      Expected Outcomes Short: exercise to improve shortness breath to minimize stress. Long: Maintain a stress free environment at home and with his breathing. Short: attend [facility] regularly to improve stress. Long: Maintain a workout regimine to minimize stress       Interventions Encouraged to attend Pulmonary Rehabilitation for the exercise;Stress management education Encouraged to attend Pulmonary Rehabilitation for the exercise      Continue Psychosocial Services  Follow up required by staff Follow up required by staff         Psychosocial Discharge (Final Psychosocial Re-Evaluation):     Psychosocial Re-Evaluation - 05/15/17 1635      Psychosocial Re-Evaluation   Current issues with None Identified;Current Stress  Concerns   Comments Aedon as been vacationing and has more scheduled in the next couple weeks. He states his relationship with his girlfriend is going very well and they have been togehter for 6 years. His shortness of breath and his chronic illness seems to be his only stressor.   Expected Outcomes Short: attend [facility] regularly to improve stress. Long: Maintain a workout regimine to minimize stress    Interventions Encouraged to attend Pulmonary Rehabilitation for the exercise   Continue Psychosocial Services  Follow up required by staff      Education: Education Goals: Education classes will be provided on a weekly basis, covering required topics. Participant will state understanding/return demonstration of topics presented.  Learning Barriers/Preferences:     Learning Barriers/Preferences - 03/28/17 1320      Learning Barriers/Preferences   Learning Barriers None   Learning Preferences None      Education  Topics: Initial Evaluation Education: - Verbal, written and demonstration of respiratory meds, RPE/PD scales, oximetry and breathing techniques. Instruction on use of nebulizers and MDIs: cleaning and proper use, rinsing mouth with steroid doses and importance of monitoring MDI activations.   Pulmonary Rehab from 05/15/2017 in [facility] Cardiac and Pulmonary Rehab  Date  03/28/17  Educator  Sb  Instruction Review Code  1- Verbalizes Understanding      General Nutrition Guidelines/Fats and Fiber: -Group instruction provided by verbal, written material, models and posters to present the general guidelines for heart healthy nutrition. Gives an explanation and review of dietary fats and fiber.   Pulmonary Rehab from 05/15/2017 in [facility] Cardiac and Pulmonary Rehab  Date  05/15/17  Educator  CR  Instruction Review Code  1- Verbalizes Understanding      Controlling Sodium/Reading Food Labels: -Group verbal and written material supporting the discussion of sodium use in heart healthy nutrition. Review and explanation with models, verbal and written materials for utilization of the food label.   Exercise Physiology & Risk Factors: - Group verbal and written instruction with models to review the exercise physiology of the cardiovascular system and associated critical values. Details cardiovascular disease risk factors and the goals associated with each risk factor.   Aerobic Exercise & Resistance Training: - Gives group verbal and written discussion on the health impact of inactivity. On the components of aerobic and resistive training programs and the benefits of this training and how to safely progress through these programs.   Flexibility, Balance, General Exercise Guidelines: - Provides group verbal and written instruction on the benefits of flexibility and balance training programs. Provides general exercise guidelines with specific guidelines to those with heart or lung disease. Demonstration  and skill practice provided.   Stress Management: - Provides group verbal and written instruction about the health risks of elevated stress, cause of high stress, and healthy ways to reduce stress.   Pulmonary Rehab from 05/15/2017 in [facility] Cardiac and Pulmonary Rehab  Date  04/19/17  Educator  [facility]  Instruction Review Code  1- Verbalizes Understanding      Depression: - Provides group verbal and written instruction on the correlation between heart/lung disease and depressed mood, treatment options, and the stigmas associated with seeking treatment.   Exercise & Equipment Safety: - Individual verbal instruction and demonstration of equipment use and safety with use of the equipment.   Infection Prevention: - Provides verbal and written material to individual with discussion of infection control including proper hand washing and proper equipment cleaning during exercise session.   Pulmonary Rehab from 05/15/2017  in [facility] Cardiac and Pulmonary Rehab  Date  03/28/17  Educator  Sb  Instruction Review Code  1- Verbalizes Understanding      Falls Prevention: - Provides verbal and written material to individual with discussion of falls prevention and safety.   Pulmonary Rehab from 05/15/2017 in [facility] Cardiac and Pulmonary Rehab  Date  03/28/17  Educator  SB  Instruction Review Code  1- Verbalizes Understanding      Diabetes: - Individual verbal and written instruction to review signs/symptoms of diabetes, desired ranges of glucose level fasting, after meals and with exercise. Advice that pre and post exercise glucose checks will be done for 3 sessions at entry of program.   Pulmonary Rehab from 05/15/2017 in [facility] Cardiac and Pulmonary Rehab  Date  03/28/17  Educator  Sb  Instruction Review Code  1- Verbalizes Understanding      Chronic Lung Diseases: - Group verbal and written instruction to review new updates, new respiratory medications, new advancements in procedures and  treatments. Provide informative websites and "800" numbers of self-education.   Pulmonary Rehab from 05/15/2017 in [facility] Cardiac and Pulmonary Rehab  Date  05/03/17  Educator  [facility]  Instruction Review Code  1- Verbalizes Understanding      Lung Procedures: - Group verbal and written instruction to describe testing methods done to diagnose lung disease. Review the outcome of test results. Describe the treatment choices: Pulmonary Function Tests, ABGs and oximetry.   Energy Conservation: - Provide group verbal and written instruction for methods to conserve energy, plan and organize activities. Instruct on pacing techniques, use of adaptive equipment and posture/positioning to relieve shortness of breath.   Pulmonary Rehab from 05/15/2017 in [facility] Cardiac and Pulmonary Rehab  Date  04/12/17  Educator  [facility]  Instruction Review Code  1- Verbalizes Understanding      Triggers: - Group verbal and written instruction to review types of environmental controls: home humidity, furnaces, filters, dust mite/pet prevention, HEPA vacuums. To discuss weather changes, air quality and the benefits of nasal washing.   Exacerbations: - Group verbal and written instruction to provide: warning signs, infection symptoms, calling MD promptly, preventive modes, and value of vaccinations. Review: effective airway clearance, coughing and/or vibration techniques. Create an Sports administrator.   Oxygen: - Individual and group verbal and written instruction on oxygen therapy. Includes supplement oxygen, available portable oxygen systems, continuous and intermittent flow rates, oxygen safety, concentrators, and Medicare reimbursement for oxygen.   Respiratory Medications: - Group verbal and written instruction to review medications for lung disease. Drug class, frequency, complications, importance of spacers, rinsing mouth after steroid MDI's, and proper cleaning methods for nebulizers.   AED/CPR: - Group verbal and  written instruction with the use of models to demonstrate the basic use of the AED with the basic ABC's of resuscitation.   Breathing Retraining: - Provides individuals verbal and written instruction on purpose, frequency, and proper technique of diaphragmatic breathing and pursed-lipped breathing. Applies individual practice skills.   Pulmonary Rehab from 05/15/2017 in [facility] Cardiac and Pulmonary Rehab  Date  03/28/17  Educator  Sb  Instruction Review Code  1- Verbalizes Understanding      Anatomy and Physiology of the Lungs: - Group verbal and written instruction with the use of models to provide basic lung anatomy and physiology related to function, structure and complications of lung disease.   Anatomy & Physiology of the Heart: - Group verbal and written instruction and models provide basic cardiac anatomy and physiology, with the  coronary electrical and arterial systems. Review of: AMI, Angina, Valve disease, Heart Failure, Cardiac Arrhythmia, Pacemakers, and the ICD.   Heart Failure: - Group verbal and written instruction on the basics of heart failure: signs/symptoms, treatments, explanation of ejection fraction, enlarged heart and cardiomyopathy.   Sleep Apnea: - Individual verbal and written instruction to review Obstructive Sleep Apnea. Review of risk factors, methods for diagnosing and types of masks and machines for OSA.   Anxiety: - Provides group, verbal and written instruction on the correlation between heart/lung disease and anxiety, treatment options, and management of anxiety.   Relaxation: - Provides group, verbal and written instruction about the benefits of relaxation for patients with heart/lung disease. Also provides patients with examples of relaxation techniques.   Cardiac Medications: - Group verbal and written instruction to review commonly prescribed medications for heart disease. Reviews the medication, class of the drug, and side effects.   Know  Your Numbers: -Group verbal and written instruction about important numbers in your health.  Review of Cholesterol, Blood Pressure, Diabetes, and BMI and the role they play in your overall health.   Other: -Provides group and verbal instruction on various topics (see comments)    Knowledge Questionnaire Score:     Knowledge Questionnaire Score - 03/28/17 1320      Knowledge Questionnaire Score   Pre Score 6/10  reviewed correct response with Mikki Santee and he verbalized understanding of the response       Core Components/Risk Factors/Patient Goals at Admission:     Personal Goals and Risk Factors at Admission - 03/28/17 1338      Core Components/Risk Factors/Patient Goals on Admission   Improve shortness of breath with ADL's Yes   Intervention Provide education, individualized exercise plan and daily activity instruction to help decrease symptoms of SOB with activities of daily living.   Expected Outcomes Short Term: Achieves a reduction of symptoms when performing activities of daily living.  Long term goal: Improved ability to perform ADL's with less SOB   Develop more efficient breathing techniques such as purse lipped breathing and diaphragmatic breathing; and practicing self-pacing with activity Yes   Intervention Provide education, demonstration and support about specific breathing techniuqes utilized for more efficient breathing. Include techniques such as pursed lipped breathing, diaphragmatic breathing and self-pacing activity.   Expected Outcomes Short Term: Participant will be able to demonstrate and use breathing techniques as needed throughout daily activities.  Long term goal: Continued use of techniques learned to improve efficiency of breathing   Increase knowledge of respiratory medications and ability to use respiratory devices properly  Yes   Intervention Provide education and demonstration as needed of appropriate use of medications, inhalers, and oxygen therapy.    Expected Outcomes Short Term: Achieves understanding of medications use. Understands that oxygen is a medication prescribed by physician. Demonstrates appropriate use of inhaler and oxygen therapy.  Long Term Goal: Maintains ability to utilize medications, inhalers and oxygen therapy   Diabetes Yes   Intervention Provide education about signs/symptoms and action to take for hypo/hyperglycemia.;Provide education about proper nutrition, including hydration, and aerobic/resistive exercise prescription along with prescribed medications to achieve blood glucose in normal ranges: Fasting glucose 65-99 mg/dL   Expected Outcomes Short Term: Participant verbalizes understanding of the signs/symptoms and immediate care of hyper/hypoglycemia, proper foot care and importance of medication, aerobic/resistive exercise and nutrition plan for blood glucose control.;Long Term: Attainment of HbA1C < 7%.   Hypertension Yes   Intervention Provide education on lifestyle modifcations including regular physical  activity/exercise, weight management, moderate sodium restriction and increased consumption of fresh fruit, vegetables, and low fat dairy, alcohol moderation, and smoking cessation.;Monitor prescription use compliance.   Expected Outcomes Short Term: Continued assessment and intervention until BP is < 140/93m HG in hypertensive participants. < 130/843mHG in hypertensive participants with diabetes, heart failure or chronic kidney disease.;Long Term: Maintenance of blood pressure at goal levels.   Lipids Yes   Intervention Provide education and support for participant on nutrition & aerobic/resistive exercise along with prescribed medications to achieve LDL <707mHDL >55m47m Expected Outcomes Short Term: Participant states understanding of desired cholesterol values and is compliant with medications prescribed. Participant is following exercise prescription and nutrition guidelines.;Long Term: Cholesterol controlled  with medications as prescribed, with individualized exercise RX and with personalized nutrition plan. Value goals: LDL < 70mg78mL > 40 mg.      Core Components/Risk Factors/Patient Goals Review:      Goals and Risk Factor Review    Row Name 04/19/17 1355 05/15/17 1622           Core Components/Risk Factors/Patient Goals Review   Personal Goals Review Weight Management/Obesity;Lipids;Hypertension;Diabetes Weight Management/Obesity;Improve shortness of breath with ADL's;Hypertension;Diabetes;Lipids;Increase knowledge of respiratory medications and ability to use respiratory devices properly.      Review Roberts weight has been steady and wants to lose weight. He has not met with the nutritionist. His blood pressure has been under control since the start of the program. He does not know his last lipids results. Blood sugars have been checked and within acceptable ranges. He also checks his blood sugar regularly at home. RoberSohames he has been felling better and breathing a little easier. He takes his albuterol and his inhalers as prescribed but has no interest in using a spacer. His blood pressure has been stable since the start of the program. His weight has increase slightyl due to vacations he has been going on and has scheduled.      Expected Outcomes Short: Continue to lose weight. Long: Maintain weight after weight loss  Short: convince [place] to use a spacer with his inhaler. Long: Use spacer and take medications independently.         Core Components/Risk Factors/Patient Goals at Discharge (Final Review):      Goals and Risk Factor Review - 05/15/17 1622      Core Components/Risk Factors/Patient Goals Review   Personal Goals Review Weight Management/Obesity;Improve shortness of breath with ADL's;Hypertension;Diabetes;Lipids;Increase knowledge of respiratory medications and ability to use respiratory devices properly.   Review RoberKeires he has been felling better and breathing  a little easier. He takes his albuterol and his inhalers as prescribed but has no interest in using a spacer. His blood pressure has been stable since the start of the program. His weight has increase slightyl due to vacations he has been going on and has scheduled.   Expected Outcomes Short: convince Rodert to use a spacer with his inhaler. Long: Use spacer and take medications independently.      ITP Comments:     ITP Comments    Row Name 03/28/17 1230 03/28/17 1403 04/24/17 0831 04/26/17 1015 05/03/17 1221   ITP Comments Medical review completed. INitial ITP created and sent to Dr Mark Emily Filbertsignature and suggested changes.   Documentation of diagnosis can be found in [facility] visit Patient met with Dr. Mark Emily Filbertctor of [facility] for face to face contact. 30 day review completed. ITP sent to Dr. MarkElta Guadeloupe  Investment banker, corporate of [place]. Continue with ITP unless changes are made by physician.   [place] was absent today and could not see Dr. Emily Filbert for face to face and chart review. Vicki seen Dr. Lequita Halt who signed for Dr. Emily Filbert Director of [place] for face to face and chart review   Row Name 05/22/17 0823           ITP Comments 30 day review completed. ITP sent to Dr. Emily Filbert Director of [place]. Continue with ITP unless changes are made by physician.            Comments: 30 day review

## 2017-05-29 DIAGNOSIS — Z79899 Other long term (current) drug therapy: Secondary | ICD-10-CM | POA: Diagnosis not present

## 2017-05-29 DIAGNOSIS — E119 Type 2 diabetes mellitus without complications: Secondary | ICD-10-CM | POA: Diagnosis not present

## 2017-05-29 DIAGNOSIS — J449 Chronic obstructive pulmonary disease, unspecified: Secondary | ICD-10-CM | POA: Diagnosis not present

## 2017-05-29 DIAGNOSIS — Z87891 Personal history of nicotine dependence: Secondary | ICD-10-CM | POA: Diagnosis not present

## 2017-05-29 DIAGNOSIS — Z7984 Long term (current) use of oral hypoglycemic drugs: Secondary | ICD-10-CM | POA: Diagnosis not present

## 2017-05-29 DIAGNOSIS — I10 Essential (primary) hypertension: Secondary | ICD-10-CM | POA: Diagnosis not present

## 2017-05-29 NOTE — Progress Notes (Signed)
Daily Session Note  Patient Details  Name: Cody Bridges MRN: [id number] Date of Birth: [birth year] Referring Provider:    Encounter Date: 05/29/2017  Check In:     Session Check In - 05/29/17 1014      Check-In   Location [facility]-Cardiac & Pulmonary Rehab   Staff Present Nada Maclachlan, BA, ACSM CEP, Exercise Physiologist;Kelly Amedeo Plenty, BS, ACSM CEP, Exercise Physiologist;Grigor Lipschutz Flavia Shipper   Supervising physician immediately available to respond to emergencies [facility] immediately available ER MD   Physician(s) Dr. Cinda Quest and Reita Cliche   Medication changes reported     No   Fall or balance concerns reported    No   Warm-up and Cool-down Performed as group-led instruction   Resistance Training Performed Yes   VAD Patient? No     Pain Assessment   Currently in Pain? No/denies         History  Smoking Status  . Former Smoker  . Packs/day: 2.00  . Years: 35.00  . Types: Cigarettes  . Quit date: 10/30/1985  Smokeless Tobacco  . Never Used    Goals Met:  Independence with exercise equipment Exercise tolerated well No report of cardiac concerns or symptoms Strength training completed today  Goals Unmet:  Not Applicable  Comments: Pt able to follow exercise prescription today without complaint.  Will continue to monitor for progression.   Dr. Emily Filbert is Medical Director for [place] and [facility].

## 2017-05-30 DIAGNOSIS — H2512 Age-related nuclear cataract, left eye: Secondary | ICD-10-CM | POA: Diagnosis not present

## 2017-05-30 LAB — HM DIABETES EYE EXAM

## 2017-05-31 ENCOUNTER — Encounter: Payer: Medicare Other | Admitting: *Deleted

## 2017-05-31 ENCOUNTER — Encounter: Payer: Self-pay | Admitting: Family Medicine

## 2017-05-31 DIAGNOSIS — I10 Essential (primary) hypertension: Secondary | ICD-10-CM | POA: Diagnosis not present

## 2017-05-31 DIAGNOSIS — Z87891 Personal history of nicotine dependence: Secondary | ICD-10-CM | POA: Diagnosis not present

## 2017-05-31 DIAGNOSIS — J449 Chronic obstructive pulmonary disease, unspecified: Secondary | ICD-10-CM | POA: Diagnosis not present

## 2017-05-31 DIAGNOSIS — Z7984 Long term (current) use of oral hypoglycemic drugs: Secondary | ICD-10-CM | POA: Diagnosis not present

## 2017-05-31 DIAGNOSIS — Z79899 Other long term (current) drug therapy: Secondary | ICD-10-CM | POA: Diagnosis not present

## 2017-05-31 DIAGNOSIS — E119 Type 2 diabetes mellitus without complications: Secondary | ICD-10-CM | POA: Diagnosis not present

## 2017-05-31 NOTE — Progress Notes (Signed)
o

## 2017-05-31 NOTE — Progress Notes (Signed)
Daily Session Note  Patient Details  Name: Jordy Verba Lebow MRN: [id number] Date of Birth: May 28, 1933 Referring Provider:    Encounter Date: 05/31/2017  Check In:     Session Check In - 05/31/17 0958      Check-In   Location [facility]-Cardiac & Pulmonary Rehab   Staff Present Nada Maclachlan, BA, ACSM CEP, Exercise Physiologist;Zyire Eidson Luan Pulling, MA, ACSM RCEP, Exercise Physiologist;Joseph Flavia Shipper   Supervising physician immediately available to respond to emergencies [facility] immediately available ER MD   Physician(s) Drs. Alfred Levins and Lord   Medication changes reported     No   Fall or balance concerns reported    No   Warm-up and Cool-down Performed as group-led Location manager Performed Yes   VAD Patient? No     Pain Assessment   Currently in Pain? No/denies   Multiple Pain Sites No         History  Smoking Status  . Former Smoker  . Packs/day: 2.00  . Years: 35.00  . Types: Cigarettes  . Quit date: 10/30/1985  Smokeless Tobacco  . Never Used    Goals Met:  Proper associated with RPD/PD & O2 Sat Independence with exercise equipment Using PLB without cueing & demonstrates good technique Exercise tolerated well No report of cardiac concerns or symptoms Strength training completed today  Goals Unmet:  Not Applicable  Comments: Pt able to follow exercise prescription today without complaint.  Will continue to monitor for progression.    Dr. Emily Filbert is Medical Director for [place] and [facility] Pulmonary Rehabilitation.

## 2017-06-02 ENCOUNTER — Encounter: Payer: Medicare Other | Attending: Internal Medicine

## 2017-06-02 DIAGNOSIS — I10 Essential (primary) hypertension: Secondary | ICD-10-CM | POA: Insufficient documentation

## 2017-06-02 DIAGNOSIS — J449 Chronic obstructive pulmonary disease, unspecified: Secondary | ICD-10-CM | POA: Insufficient documentation

## 2017-06-02 DIAGNOSIS — E119 Type 2 diabetes mellitus without complications: Secondary | ICD-10-CM | POA: Insufficient documentation

## 2017-06-02 DIAGNOSIS — Z7984 Long term (current) use of oral hypoglycemic drugs: Secondary | ICD-10-CM | POA: Insufficient documentation

## 2017-06-02 DIAGNOSIS — Z87891 Personal history of nicotine dependence: Secondary | ICD-10-CM | POA: Insufficient documentation

## 2017-06-02 DIAGNOSIS — Z79899 Other long term (current) drug therapy: Secondary | ICD-10-CM | POA: Insufficient documentation

## 2017-06-05 DIAGNOSIS — J449 Chronic obstructive pulmonary disease, unspecified: Secondary | ICD-10-CM

## 2017-06-05 DIAGNOSIS — Z87891 Personal history of nicotine dependence: Secondary | ICD-10-CM | POA: Diagnosis not present

## 2017-06-05 DIAGNOSIS — I10 Essential (primary) hypertension: Secondary | ICD-10-CM | POA: Diagnosis not present

## 2017-06-05 DIAGNOSIS — E119 Type 2 diabetes mellitus without complications: Secondary | ICD-10-CM | POA: Diagnosis not present

## 2017-06-05 DIAGNOSIS — Z7984 Long term (current) use of oral hypoglycemic drugs: Secondary | ICD-10-CM | POA: Diagnosis not present

## 2017-06-05 DIAGNOSIS — Z79899 Other long term (current) drug therapy: Secondary | ICD-10-CM | POA: Diagnosis not present

## 2017-06-05 NOTE — Progress Notes (Signed)
Daily Session Note  Patient Details  Name: Mako Pelfrey Purpura MRN: [id number] Date of Birth: Aug 12, 1932 Referring Provider:    Encounter Date: 06/05/2017  Check In: Session Check In - 06/05/17 1026      Check-In   Location  [facility]    Staff Present  Earlean Shawl, BS, ACSM CEP, Exercise Physiologist;Amanda Oletta Darter, BA, ACSM CEP, Exercise Physiologist;Elanora Quin Flavia Shipper    Supervising physician immediately available to respond to emergencies  [facility] immediately available ER MD    Physician(s)  Drs. Alfred Levins and [place]    Medication changes reported      No    Fall or balance concerns reported     No    Warm-up and Cool-down  Performed as group-led Higher education careers adviser Performed  Yes    VAD Patient?  No      Pain Assessment   Currently in Pain?  No/denies          Social History   Tobacco Use  Smoking Status Former Smoker  . Packs/day: 2.00  . Years: 35.00  . Pack years: 70.00  . Types: Cigarettes  . Last attempt to quit: 10/30/1985  . Years since quitting: 31.6  Smokeless Tobacco Never Used    Goals Met:  Independence with exercise equipment Exercise tolerated well No report of cardiac concerns or symptoms Strength training completed today  Goals Unmet:  Not Applicable  Comments: Pt able to follow exercise prescription today without complaint.  Will continue to monitor for progression.   Dr. Emily Filbert is Medical Director for [place] and [facility].

## 2017-06-07 DIAGNOSIS — I10 Essential (primary) hypertension: Secondary | ICD-10-CM | POA: Diagnosis not present

## 2017-06-07 DIAGNOSIS — Z87891 Personal history of nicotine dependence: Secondary | ICD-10-CM | POA: Diagnosis not present

## 2017-06-07 DIAGNOSIS — J449 Chronic obstructive pulmonary disease, unspecified: Secondary | ICD-10-CM

## 2017-06-07 DIAGNOSIS — Z7984 Long term (current) use of oral hypoglycemic drugs: Secondary | ICD-10-CM | POA: Diagnosis not present

## 2017-06-07 DIAGNOSIS — E119 Type 2 diabetes mellitus without complications: Secondary | ICD-10-CM | POA: Diagnosis not present

## 2017-06-07 DIAGNOSIS — Z79899 Other long term (current) drug therapy: Secondary | ICD-10-CM | POA: Diagnosis not present

## 2017-06-07 NOTE — Progress Notes (Signed)
Daily Session Note  Patient Details  Name: Cody Bridges MRN: [id number] Date of Birth: [birth year] Referring Provider:    Encounter Date: 06/07/2017  Check In: Session Check In - 06/07/17 0957      Check-In   Location  [facility]    Staff Present  Nada Maclachlan, BA, ACSM CEP, Exercise Physiologist;Eneida Evers Darrin Nipper, [place], ACSM RCEP, Exercise Physiologist    Supervising physician immediately available to respond to emergencies  [facility] immediately available ER MD    Physician(s)  Dr. Jimmye Norman and [facility]    Medication changes reported      No    Fall or balance concerns reported     No    Warm-up and Cool-down  Performed as group-led instruction    Resistance Training Performed  Yes    VAD Patient?  No      Pain Assessment   Currently in Pain?  No/denies          Social History   Tobacco Use  Smoking Status Former Smoker  . Packs/day: 2.00  . Years: 35.00  . Pack years: 70.00  . Types: Cigarettes  . Last attempt to quit: 10/30/1985  . Years since quitting: 31.6  Smokeless Tobacco Never Used    Goals Met:  Independence with exercise equipment Exercise tolerated well No report of cardiac concerns or symptoms Strength training completed today  Goals Unmet:  Not Applicable  Comments: Pt able to follow exercise prescription today without complaint.  Will continue to monitor for progression.   Dr. Emily Filbert is Medical Director for [place] and [facility].

## 2017-06-09 DIAGNOSIS — Z87891 Personal history of nicotine dependence: Secondary | ICD-10-CM | POA: Diagnosis not present

## 2017-06-09 DIAGNOSIS — Z79899 Other long term (current) drug therapy: Secondary | ICD-10-CM | POA: Diagnosis not present

## 2017-06-09 DIAGNOSIS — J449 Chronic obstructive pulmonary disease, unspecified: Secondary | ICD-10-CM | POA: Diagnosis not present

## 2017-06-09 DIAGNOSIS — I10 Essential (primary) hypertension: Secondary | ICD-10-CM | POA: Diagnosis not present

## 2017-06-09 DIAGNOSIS — Z7984 Long term (current) use of oral hypoglycemic drugs: Secondary | ICD-10-CM | POA: Diagnosis not present

## 2017-06-09 DIAGNOSIS — E119 Type 2 diabetes mellitus without complications: Secondary | ICD-10-CM | POA: Diagnosis not present

## 2017-06-09 NOTE — Progress Notes (Signed)
Daily Session Note  Patient Details  Name: Prestin Munch Cragg MRN: [id number] Date of Birth: Sep 11, 1932 Referring Provider:    Encounter Date: 06/09/2017  Check In: Session Check In - 06/09/17 1126      Check-In   Location  [facility]    Staff Present  Justin Mend RCP,RRT,BSRT;Carroll Enterkin, RN, Vickki Hearing, BA, ACSM CEP, Exercise Physiologist    Supervising physician immediately available to respond to emergencies  [facility] immediately available ER MD    Physician(s)  Dr. Mable Paris and [facility]    Medication changes reported      No    Fall or balance concerns reported     No    Warm-up and Cool-down  Performed as group-led instruction    Resistance Training Performed  Yes    VAD Patient?  No      Pain Assessment   Currently in Pain?  No/denies          Social History   Tobacco Use  Smoking Status Former Smoker  . Packs/day: 2.00  . Years: 35.00  . Pack years: 70.00  . Types: Cigarettes  . Last attempt to quit: 10/30/1985  . Years since quitting: 31.6  Smokeless Tobacco Never Used    Goals Met:  Independence with exercise equipment Exercise tolerated well No report of cardiac concerns or symptoms Strength training completed today  Goals Unmet:  Not Applicable  Comments: Pt able to follow exercise prescription today without complaint.  Will continue to monitor for progression.   Dr. Emily Filbert is Medical Director for [place] and [facility].

## 2017-06-12 DIAGNOSIS — Z7984 Long term (current) use of oral hypoglycemic drugs: Secondary | ICD-10-CM | POA: Diagnosis not present

## 2017-06-12 DIAGNOSIS — J449 Chronic obstructive pulmonary disease, unspecified: Secondary | ICD-10-CM

## 2017-06-12 DIAGNOSIS — I10 Essential (primary) hypertension: Secondary | ICD-10-CM | POA: Diagnosis not present

## 2017-06-12 DIAGNOSIS — Z87891 Personal history of nicotine dependence: Secondary | ICD-10-CM | POA: Diagnosis not present

## 2017-06-12 DIAGNOSIS — Z79899 Other long term (current) drug therapy: Secondary | ICD-10-CM | POA: Diagnosis not present

## 2017-06-12 DIAGNOSIS — E119 Type 2 diabetes mellitus without complications: Secondary | ICD-10-CM | POA: Diagnosis not present

## 2017-06-12 NOTE — Progress Notes (Signed)
Daily Session Note  Patient Details  Name: Cody Bridges MRN: [id number] Date of Birth: 09/06/1932 Referring Provider:    Encounter Date: 06/12/2017  Check In: Session Check In - 06/12/17 1007      Check-In   Location  [facility]    Staff Present  Amanda Sommer, BA, ACSM CEP, Exercise Physiologist;Kelly Hayes, BS, ACSM CEP, Exercise Physiologist;Joseph Hood RCP,RRT,BSRT    Supervising physician immediately available to respond to emergencies  [facility] immediately available ER MD    Physician(s)  Dr. Mcshane and [place]    Medication changes reported      No    Fall or balance concerns reported     No    Warm-up and Cool-down  Performed as group-led instruction    Resistance Training Performed  Yes    VAD Patient?  No      Pain Assessment   Currently in Pain?  No/denies          Social History   Tobacco Use  Smoking Status Former Smoker  . Packs/day: 2.00  . Years: 35.00  . Pack years: 70.00  . Types: Cigarettes  . Last attempt to quit: 10/30/1985  . Years since quitting: 31.6  Smokeless Tobacco Never Used    Goals Met:  Independence with exercise equipment Exercise tolerated well No report of cardiac concerns or symptoms Strength training completed today  Goals Unmet:  Not Applicable  Comments: Pt able to follow exercise prescription today without complaint.  Will continue to monitor for progression.   Dr. Mark Miller is Medical Director for [facility] and [facility]. 

## 2017-06-19 DIAGNOSIS — J449 Chronic obstructive pulmonary disease, unspecified: Secondary | ICD-10-CM

## 2017-06-19 NOTE — Progress Notes (Signed)
Pulmonary Individual Treatment Plan  Patient Details  Name: Cody Bridges MRN: [id number] Date of Birth: Oct 20, 1932 Referring Provider:    Initial Encounter Date:    Pulmonary Rehab from 03/28/2017 in [facility]  Date  03/28/17      Visit Diagnosis: Chronic obstructive pulmonary disease, unspecified COPD type ([place])  Patient's Home Medications on Admission:  Current Outpatient Medications:  .  ACCU-CHEK AVIVA test strip, TEST EVERY DAY, Disp: , Rfl: 5 .  ACCU-CHEK SOFTCLIX LANCETS lancets, daily., Disp: , Rfl: 5 .  calcium carbonate (TUMS - DOSED IN MG ELEMENTAL CALCIUM) 500 MG chewable tablet, Chew 1-2 tablets by mouth 3 (three) times daily as needed (for acid reflux/indigestion.)., Disp: , Rfl:  .  Cholecalciferol (VITAMIN D-3) 5000 units TABS, Take 5,000 Units by mouth daily., Disp: , Rfl:  .  Fluticasone-Umeclidin-Vilant (TRELEGY ELLIPTA) 100-62.5-25 MCG/INH AEPB, Inhale 1 puff into the lungs daily., Disp: 60 each, Rfl: 5 .  hydrochlorothiazide (HYDRODIURIL) 25 MG tablet, Take 25 mg by mouth daily., Disp: , Rfl:  .  lisinopril (PRINIVIL,ZESTRIL) 5 MG tablet, Take 5 mg by mouth daily., Disp: , Rfl:  .  metFORMIN (GLUCOPHAGE) 500 MG tablet, Take 500 mg by mouth 2 (two) times daily., Disp: , Rfl:  .  Multiple Vitamin (MULTIVITAMIN WITH MINERALS) TABS tablet, Take 1 tablet by mouth daily., Disp: , Rfl:  .  PROAIR HFA 108 (90 Base) MCG/ACT inhaler, Inhale 1-2 puffs into the lungs every 6 (six) hours as needed (for wheezing/shortness of breath). , Disp: , Rfl:  .  rosuvastatin (CRESTOR) 20 MG tablet, Take 20 mg by mouth daily., Disp: , Rfl:  .  triamcinolone ointment (KENALOG) 0.1 %, Apply 1 application topically 2 (two) times daily as needed (for dry skin/irritation.)., Disp: , Rfl:   Past Medical History: Past Medical History:  Diagnosis Date  . Arthritis   . Asthma   . Diabetes mellitus without complication ([place])     9563  . Dyspnea    with exertion  .  GERD (gastroesophageal reflux disease)   . Hyperlipidemia   . Hypertension     Tobacco Use: Social History   Tobacco Use  Smoking Status Former Smoker  . Packs/day: 2.00  . Years: 35.00  . Pack years: 70.00  . Types: Cigarettes  . Last attempt to quit: 10/30/1985  . Years since quitting: 31.6  Smokeless Tobacco Never Used    Labs: Recent Review Flowsheet Data    Labs for ITP Cardiac and Pulmonary Rehab 10/24/2016 01/26/2017 05/01/2017   Hemoglobin A1c 6.8(H) 6.5 6.3       Pulmonary Assessment Scores: Pulmonary Assessment Scores    Row Name 03/28/17 1330         ADL UCSD   ADL Phase  Entry     SOB Score total  76     Rest  2     Walk  3     Stairs  4     Bath  2     Dress  2     Shop  3       CAT Score   CAT Score  25       mMRC Score   mMRC Score  1        Pulmonary Function Assessment: Pulmonary Function Assessment - 03/28/17 1348      Pulmonary Function Tests   FVC%  78 % Test performed 12/15/2016    FEV1%  59 %    FEV1/FVC Ratio  59      Breath   Shortness of Breath  Yes;Fear of Shortness of Breath;Limiting activity       Exercise Target Goals:    Exercise Program Goal: Individual exercise prescription set with THRR, safety & activity barriers. Participant demonstrates ability to understand and report RPE using BORG scale, to self-measure pulse accurately, and to acknowledge the importance of the exercise prescription.  Exercise Prescription Goal: Starting with aerobic activity 30 plus minutes a day, 3 days per week for initial exercise prescription. Provide home exercise prescription and guidelines that participant acknowledges understanding prior to discharge.  Activity Barriers & Risk Stratification: Activity Barriers & Cardiac Risk Stratification - 03/28/17 1236      Activity Barriers & Cardiac Risk Stratification   Activity Barriers  Arthritis;Back Problems;Shortness of Breath;Muscular Weakness Low back pain, arthritis hands and other  joints, legs weak, SOB from chronic lung disease       6 Minute Walk: 6 Minute Walk    Row Name 03/28/17 1332         6 Minute Walk   Distance  950 feet     Walk Time  6 minutes     # of Rest Breaks  0     MPH  1.79     METS  1.86     RPE  15     Perceived Dyspnea   2     VO2 Peak  6.5     Symptoms  Yes (comment)     Comments  back pain 7/10     Resting HR  95 bpm     Resting BP  122/68     Resting Oxygen Saturation   96 %     Exercise Oxygen Saturation  during 6 min walk  95 %     Max Ex. HR  132 bpm     Max Ex. BP  132/56     2 Minute Post BP  116/70       Interval HR   1 Minute HR  114     2 Minute HR  120     3 Minute HR  132     4 Minute HR  124     5 Minute HR  121     6 Minute HR  123     2 Minute Post HR  85     Interval Heart Rate?  Yes       Interval Oxygen   Interval Oxygen?  Yes     Baseline Oxygen Saturation %  96 %     1 Minute Oxygen Saturation %  95 %     1 Minute Liters of Oxygen  0 L     2 Minute Oxygen Saturation %  95 %     2 Minute Liters of Oxygen  0 L     3 Minute Oxygen Saturation %  95 %     3 Minute Liters of Oxygen  0 L     4 Minute Oxygen Saturation %  95 %     4 Minute Liters of Oxygen  0 L     5 Minute Oxygen Saturation %  96 %     5 Minute Liters of Oxygen  0 L     6 Minute Oxygen Saturation %  96 %     6 Minute Liters of Oxygen  0 L     2 Minute Post Oxygen Saturation %  97 %  2 Minute Post Liters of Oxygen  0 L       Oxygen Initial Assessment: Oxygen Initial Assessment - 03/28/17 1231      Home Oxygen   Home Oxygen Device  None    Sleep Oxygen Prescription  None    Home Exercise Oxygen Prescription  None    Home at Rest Exercise Oxygen Prescription  None      Initial 6 min Walk   Oxygen Used  None      Program Oxygen Prescription   Program Oxygen Prescription  None      Intervention   Short Term Goals  To learn and understand importance of monitoring SPO2 with pulse oximeter and demonstrate accurate use of  the pulse oximeter.;To learn and demonstrate proper pursed lip breathing techniques or other breathing techniques.;To learn and understand importance of maintaining oxygen saturations>88%;To learn and demonstrate proper use of respiratory medications    Long  Term Goals  Verbalizes importance of monitoring SPO2 with pulse oximeter and return demonstration;Exhibits proper breathing techniques, such as pursed lip breathing or other method taught during program session;Demonstrates proper use of MDI's;Compliance with respiratory medication;Maintenance of O2 saturations>88%       Oxygen Re-Evaluation: Oxygen Re-Evaluation    Row Name 04/10/17 1040 04/19/17 1349 05/15/17 1626 05/29/17 1604       Program Oxygen Prescription   Program Oxygen Prescription  -  None  None  None      Home Oxygen   Home Oxygen Device  None  None  None  None    Sleep Oxygen Prescription  None  None  None  None    Home Exercise Oxygen Prescription  None  None  None  None    Home at Rest Exercise Oxygen Prescription  None  None  None  None      Goals/Expected Outcomes   Short Term Goals  To learn and understand importance of monitoring SPO2 with pulse oximeter and demonstrate accurate use of the pulse oximeter.;To learn and demonstrate proper pursed lip breathing techniques or other breathing techniques.;To learn and understand importance of maintaining oxygen saturations>88%;To learn and demonstrate proper use of respiratory medications  To learn and understand importance of maintaining oxygen saturations>88%;To learn and demonstrate proper use of respiratory medications;To learn and demonstrate proper pursed lip breathing techniques or other breathing techniques.;To learn and understand importance of monitoring SPO2 with pulse oximeter and demonstrate accurate use of the pulse oximeter.  To learn and understand importance of maintaining oxygen saturations>88%;To learn and demonstrate proper pursed lip breathing techniques  or other breathing techniques.;To learn and understand importance of monitoring SPO2 with pulse oximeter and demonstrate accurate use of the pulse oximeter.;To learn and demonstrate proper use of respiratory medications  To learn and understand importance of maintaining oxygen saturations>88%;To learn and demonstrate proper pursed lip breathing techniques or other breathing techniques.;To learn and understand importance of monitoring SPO2 with pulse oximeter and demonstrate accurate use of the pulse oximeter.;To learn and demonstrate proper use of respiratory medications    Long  Term Goals  Maintenance of O2 saturations>88%;Compliance with respiratory medication;Verbalizes importance of monitoring SPO2 with pulse oximeter and return demonstration;Exhibits proper breathing techniques, such as pursed lip breathing or other method taught during program session;Demonstrates proper use of MDI's  Verbalizes importance of monitoring SPO2 with pulse oximeter and return demonstration;Exhibits proper breathing techniques, such as pursed lip breathing or other method taught during program session;Demonstrates proper use of MDI's;Compliance with respiratory medication;Maintenance of O2 saturations>88%  Maintenance of  O2 saturations>88%;Compliance with respiratory medication;Demonstrates proper use of MDI's;Exhibits proper breathing techniques, such as pursed lip breathing or other method taught during program session;Verbalizes importance of monitoring SPO2 with pulse oximeter and return demonstration  Maintenance of O2 saturations>88%;Compliance with respiratory medication;Demonstrates proper use of MDI's;Exhibits proper breathing techniques, such as pursed lip breathing or other method taught during program session;Verbalizes importance of monitoring SPO2 with pulse oximeter and return demonstration    Comments  Reviewed PLB technique with pt.  Talked about how it work and it's important to maintaining his exercise  saturations.    Informed Jaz about the effects of COPD, He does not have a pulse oximeter at home to check his oxygen. Informed him how to obtain one and to make sure his oxygen is above 88 percent. He needs alot of imrpovement on PLB techniques.  Dontarious states that he checks his blood  pressure and his heart rate at home regularly. He does not show an interest in purchasing a pulse oximeter to check his oxygen saturation. He does not want to use a spacer for his inhalers either. His oxygen levels have been above 88 percent while exercising.  His PLB has got better but still could use practice.  Brewer states that he has been a little more short of breath this past week. He states he gets sore from exercising and usually wont attend until he is not sore. Informed him that he should come more frequently to help with his soreness and his shortness of breath.    Goals/Expected Outcomes  Short: Become more profiecient at using PLB.   Long: Become independent at using PLB.  Short:obtatin a pulse oximeter and work on PLB. Long: Check and maintain an oxygen saturation greater than 88 precent. Be proficient with PLB  Short: convince Maximilien to use a spacer with his medications. Long: Independently use a spacer with his medications  Short: attened [employer] regulary. Long: maintain an exercise regimine to help with shortness of breath.       Oxygen Discharge (Final Oxygen Re-Evaluation): Oxygen Re-Evaluation - 05/29/17 1604      Program Oxygen Prescription   Program Oxygen Prescription  None      Home Oxygen   Home Oxygen Device  None    Sleep Oxygen Prescription  None    Home Exercise Oxygen Prescription  None    Home at Rest Exercise Oxygen Prescription  None      Goals/Expected Outcomes   Short Term Goals  To learn and understand importance of maintaining oxygen saturations>88%;To learn and demonstrate proper pursed lip breathing techniques or other breathing techniques.;To learn and understand  importance of monitoring SPO2 with pulse oximeter and demonstrate accurate use of the pulse oximeter.;To learn and demonstrate proper use of respiratory medications    Long  Term Goals  Maintenance of O2 saturations>88%;Compliance with respiratory medication;Demonstrates proper use of MDI's;Exhibits proper breathing techniques, such as pursed lip breathing or other method taught during program session;Verbalizes importance of monitoring SPO2 with pulse oximeter and return demonstration    Comments  Darrly states that he has been a little more short of breath this past week. He states he gets sore from exercising and usually wont attend until he is not sore. Informed him that he should come more frequently to help with his soreness and his shortness of breath.    Goals/Expected Outcomes  Short: attened [employer] regulary. Long: maintain an exercise regimine to help with shortness of breath.       Initial Exercise  Prescription: Initial Exercise Prescription - 03/28/17 1300      Date of Initial Exercise RX and Referring Provider   Date  03/28/17      Treadmill   MPH  1.5    Grade  0    Minutes  15    METs  2.15      Recumbant Bike   Level  1    RPM  60    Watts  5    Minutes  15    METs  1.8      NuStep   Level  2    SPM  80    Minutes  15    METs  2      Arm Ergometer   Level  1    RPM  50    Minutes  15    METs  1.6      Prescription Details   Frequency (times per week)  3    Duration  Progress to 45 minutes of aerobic exercise without signs/symptoms of physical distress      Intensity   THRR 40-80% of Max Heartrate  112-129    Ratings of Perceived Exertion  11-15    Perceived Dyspnea  0-4      Progression   Progression  Continue to progress workloads to maintain intensity without signs/symptoms of physical distress.      Resistance Training   Training Prescription  Yes    Weight  3    Reps  10-15       Perform Capillary Blood Glucose checks as  needed.  Exercise Prescription Changes: Exercise Prescription Changes    Row Name 03/28/17 1300 04/12/17 1500 04/26/17 1500 05/03/17 1400 05/10/17 1000     Response to Exercise   Blood Pressure (Admit)  122/68  132/64  -  -  124/64   Blood Pressure (Exercise)  132/56  -  -  -  -   Blood Pressure (Exit)  116/70  128/68  132/84  -  -   Heart Rate (Admit)  95 bpm  95 bpm  -  -  98 bpm   Heart Rate (Exercise)  132 bpm  96 bpm  121 bpm  -  123 bpm   Heart Rate (Exit)  85 bpm  56 bpm  99 bpm  -  -   Oxygen Saturation (Admit)  96 %  95 %  97 %  -  94 %   Oxygen Saturation (Exercise)  95 %  95 %  95 %  -  95 %   Oxygen Saturation (Exit)  97 %  97 %  95 %  -  -   Rating of Perceived Exertion (Exercise)  '15  15  15  ' -  14   Perceived Dyspnea (Exercise)  '2  3  3  ' -  3   Symptoms  back pain 7/10  none  none  -  none   Duration  Progress to 45 minutes of aerobic exercise without signs/symptoms of physical distress  Progress to 45 minutes of aerobic exercise without signs/symptoms of physical distress  Progress to 45 minutes of aerobic exercise without signs/symptoms of physical distress  -  Continue with 45 min of aerobic exercise without signs/symptoms of physical distress.   Intensity  -  -  -  -  THRR unchanged     Progression   Progression  -  Continue to progress workloads to maintain intensity without signs/symptoms of physical distress.  Continue to progress workloads to maintain intensity without signs/symptoms of physical distress.  Continue to progress workloads to maintain intensity without signs/symptoms of physical distress.  Continue to progress workloads to maintain intensity without signs/symptoms of physical distress.   Average METs  -  1.'9  2  2  2     ' Resistance Training   Training Prescription  -  Yes  Yes  Yes  Yes   Weight  -  '3  3  3  3   ' Reps  -  10-15  10-15  10-15  10-15     Interval Training   Interval Training  -  No  No  No  No     Treadmill   MPH  -  -  1.5  1.5   1.5   Grade  -  -  0  0  0   Minutes  -  -  '15  15  15   ' METs  -  -  2.15  2.15  2.15     NuStep   Level  -  '2  2  2  2   ' SPM  -  72  72  72  69   Minutes  -  '15  15  15  15   ' METs  -  '2  2  2  2     ' Arm Ergometer   Level  -  '1  1  1  ' 1.2   RPM  -  43  41  41  -   Minutes  -  '15  15  15  15   ' METs  -  1.'8  2  2  ' -     Home Exercise Plan   Plans to continue exercise at  -  -  -  Home (comment) at his appartment complex  -   Frequency  -  -  -  Add 1 additional day to program exercise sessions.  -   Initial Home Exercises Provided  -  -  -  05/03/17  -   [place] Name 05/24/17 1400 06/07/17 1200 06/07/17 1300         Response to Exercise   Blood Pressure (Admit)  126/62  134/64  -     Blood Pressure (Exit)  124/76  130/68  -     Heart Rate (Admit)  93 bpm  103 bpm  -     Heart Rate (Exercise)  104 bpm  126 bpm  -     Heart Rate (Exit)  81 bpm  -  -     Oxygen Saturation (Admit)  99 %  97 %  -     Oxygen Saturation (Exercise)  97 %  96 %  -     Oxygen Saturation (Exit)  95 %  -  -     Rating of Perceived Exertion (Exercise)  13  12  -     Perceived Dyspnea (Exercise)  2  2  -     Symptoms  none  none  -     Duration  Continue with 45 min of aerobic exercise without signs/symptoms of physical distress.  Continue with 45 min of aerobic exercise without signs/symptoms of physical distress.  -     Intensity  THRR unchanged  THRR unchanged  -       Progression   Progression  Continue to progress workloads to maintain intensity without signs/symptoms of physical distress.  Continue to  progress workloads to maintain intensity without signs/symptoms of physical distress.  -     Average METs  2.2  2.6  -       Resistance Training   Training Prescription  Yes  Yes  -     Weight  3  3 lb  -     Reps  10-15  10-15  -       Interval Training   Interval Training  No  No  -       Treadmill   MPH  1.5  1.5  -     Grade  0  0  -     Minutes  15  15  -     METs  2.15  2.15  -        NuStep   Level  2  4  -     SPM  79  95  -     Minutes  15  15  -     METs  2  3.1  -       Home Exercise Plan   Plans to continue exercise at  -  -  Home (comment) at his appartment complex     Frequency  -  -  Add 1 additional day to program exercise sessions.     Initial Home Exercises Provided  -  -  05/03/17        Exercise Comments: Exercise Comments    Row Name 04/10/17 1046 05/03/17 1453         Exercise Comments  First full day of exercise!  Patient was oriented to gym and equipment including functions, settings, policies, and procedures.  Patient's individual exercise prescription and treatment plan were reviewed.  All starting workloads were established based on the results of the 6 minute walk test done at initial orientation visit.  The plan for exercise progression was also introduced and progression will be customized based on patient's performance and goals.  Reviewed home exercise with pt today.  Pt plans to use the fitness center at his appartment for exercise.  Reviewed THR, pulse, RPE, sign and symptoms, NTG use, and when to call 911 or MD.  Also discussed weather considerations and indoor options.  Pt voiced understanding.         Exercise Goals and Review: Exercise Goals    Row Name 03/28/17 1336             Exercise Goals   Increase Physical Activity  Yes       Intervention  Provide advice, education, support and counseling about physical activity/exercise needs.;Develop an individualized exercise prescription for aerobic and resistive training based on initial evaluation findings, risk stratification, comorbidities and participant's personal goals.       Expected Outcomes  Achievement of increased cardiorespiratory fitness and enhanced flexibility, muscular endurance and strength shown through measurements of functional capacity and personal statement of participant.       Increase Strength and Stamina  Yes       Intervention  Provide advice, education,  support and counseling about physical activity/exercise needs.;Develop an individualized exercise prescription for aerobic and resistive training based on initial evaluation findings, risk stratification, comorbidities and participant's personal goals.       Expected Outcomes  Achievement of increased cardiorespiratory fitness and enhanced flexibility, muscular endurance and strength shown through measurements of functional capacity and personal statement of participant.       Able to understand and use rate  of perceived exertion (RPE) scale  Yes       Intervention  Provide education and explanation on how to use RPE scale       Expected Outcomes  Short Term: Able to use RPE daily in rehab to express subjective intensity level;Long Term:  Able to use RPE to guide intensity level when exercising independently       Able to understand and use Dyspnea scale  Yes       Intervention  Provide education and explanation on how to use Dyspnea scale       Expected Outcomes  Short Term: Able to use Dyspnea scale daily in rehab to express subjective sense of shortness of breath during exertion;Long Term: Able to use Dyspnea scale to guide intensity level when exercising independently       Knowledge and understanding of Target Heart Rate Range (THRR)  Yes       Intervention  Provide education and explanation of THRR including how the numbers were predicted and where they are located for reference       Expected Outcomes  Short Term: Able to state/look up THRR;Long Term: Able to use THRR to govern intensity when exercising independently;Short Term: Able to use daily as guideline for intensity in rehab       Able to check pulse independently  Yes       Intervention  Provide education and demonstration on how to check pulse in carotid and radial arteries.;Review the importance of being able to check your own pulse for safety during independent exercise       Expected Outcomes  Short Term: Able to explain why pulse  checking is important during independent exercise;Long Term: Able to check pulse independently and accurately       Understanding of Exercise Prescription  Yes       Intervention  Provide education, explanation, and written materials on patient's individual exercise prescription       Expected Outcomes  Short Term: Able to explain program exercise prescription;Long Term: Able to explain home exercise prescription to exercise independently          Exercise Goals Re-Evaluation : Exercise Goals Re-Evaluation    Row Name 04/12/17 1504 04/26/17 1544 05/03/17 1454 05/10/17 1058 05/24/17 1454     Exercise Goal Re-Evaluation   Exercise Goals Review  Increase Physical Activity;Increase Strength and Stamina  Increase Physical Activity;Increase Strength and Stamina  Increase Physical Activity;Increase Strength and Stamina;Able to understand and use rate of perceived exertion (RPE) scale;Able to check pulse independently;Knowledge and understanding of Target Heart Rate Range (THRR);Understanding of Exercise Prescription;Able to understand and use Dyspnea scale  Increase Physical Activity;Increase Strength and Stamina;Understanding of Exercise Prescription  Increase Physical Activity;Increase Strength and Stamina   Comments  Mr Keehan has tolerated exercise well.  He had some muscle soreness after his first session.  Mikki Santee has tolerated exercise well.  He needs to attend 2-3 days per week to improve.  Staff will remind him to attend regularly.  Reviewed home exercise with pt today.  Pt plans to use the fitness center at his appartment for exercise.  Reviewed THR, pulse, RPE, sign and symptoms, NTG use, and when to call 911 or MD.  Also discussed weather considerations and indoor options.  Pt voiced understanding.  Mikki Santee has sporadic attendance at class.  He would see better results with regular attendance 2-3 times per week.  Staff will remind Mikki Santee of the benefits of regular exercise.  Mikki Santee has been out of  town.  he would  benefit from more frequent attendance.  Staff reviewed the benefits of exercising 3 times per week.   Expected Outcomes  Short - Mr Hannan will continue to attend regularly.  Long - Mr Kazanjian will progress with exercise.  Short - Mikki Santee will attend 2-3 days per week.  Long - Mikki Santee will see imporvement in overall MET and fitness level.  Short: add 1 day of exercise to his daily routine. Long: Increase number of days for exercise.  Short - Mikki Santee will attend at leat 2 times per week.  Long - Mikki Santee will progress more with regular attendance.  Short - Mikki Santee will attend at least 2 x per week. Long - Mikki Santee will progress more with exercise.   [place] Name 06/07/17 1302             Exercise Goal Re-Evaluation   Exercise Goals Review  Increase Physical Activity;Increase Strength and Stamina       Comments  Mikki Santee is tolerating exercise well.  Staff will continue to monitor.       Expected Outcomes  Short - Mikki Santee will exercise regularly even when travelling.  Long - Mikki Santee will maintain exercise on his own.          Discharge Exercise Prescription (Final Exercise Prescription Changes): Exercise Prescription Changes - 06/07/17 1300      Home Exercise Plan   Plans to continue exercise at  Home (comment) at his appartment complex    Frequency  Add 1 additional day to program exercise sessions.    Initial Home Exercises Provided  05/03/17       Nutrition:  Target Goals: Understanding of nutrition guidelines, daily intake of sodium <1546m, cholesterol <2048m calories 30% from fat and 7% or less from saturated fats, daily to have 5 or more servings of fruits and vegetables.  Biometrics: Pre Biometrics - 03/28/17 1330      Pre Biometrics   Height  5' 10.5" (1.791 m)    Weight  211 lb 4.8 oz (95.8 kg)    Waist Circumference  46 inches    Hip Circumference  44.5 inches    Waist to Hip Ratio  1.03 %    BMI (Calculated)  29.88        Nutrition Therapy Plan and Nutrition Goals: Nutrition Therapy & Goals - 05/29/17 1608       Nutrition Therapy   RD appointment defered  Yes      Personal Nutrition Goals   Comments  patient declines to see the dietician       Nutrition Discharge: Rate Your Plate Scores:   Nutrition Goals Re-Evaluation: Nutrition Goals Re-Evaluation    RoCatharineame 04/19/17 1403 05/15/17 1631 05/29/17 1608         Goals   Current Weight  215 lb (97.5 kg)  217 lb (98.4 kg)  215 lb (97.5 kg)     Nutrition Goal  Patient declined to see the nutritionist  Lose weight and increase strength and stamina.  Eat healthier and lose weight     Comment  Roberts weight will continued to be monitored and inform him that he can meet with a dietician is he wants to.  RoCyrussas not lost weight but has [place] a few [place] meet with the dietician.  RoDeidrickas lost 2 pounds since his last weigh in. He has been vacationing and is starting to not eat as much. He states that he is  pickey with what he ears at times.     Expected Outcome  Short: lose weight using exercise. Long: Maintain weightloss with exercise at home.  Short: Lose a few pounds before his next vacation. Long; maintain a nutrition and weightloss program.  Short: eat a heart healthy diet. Long: Adhere to a diet plan.        Nutrition Goals Discharge (Final Nutrition Goals Re-Evaluation): Nutrition Goals Re-Evaluation - 05/29/17 1608      Goals   Current Weight  215 lb (97.5 kg)    Nutrition Goal  Eat healthier and lose weight    Comment  Alberto has lost 2 pounds since his last weigh in. He has been vacationing and is starting to not eat as much. He states that he is pickey with what he ears at times.    Expected Outcome  Short: eat a heart healthy diet. Long: Adhere to a diet plan.       Psychosocial: Target Goals: Acknowledge presence or absence of significant depression and/or stress, maximize coping skills, provide positive support system. Participant is able to verbalize types and ability to use  techniques and skills needed for reducing stress and depression.   Initial Review & Psychosocial Screening: Initial Psych Review & Screening - 03/28/17 1239      Initial Review   Current issues with  None Identified HAd depression when told he possible had cancer.  CAncer was ruled out with a biospy and depression is gone.       Family Dynamics   Good Support System?  Yes Mateo Flow, his significant other      Barriers   Psychosocial barriers to participate in program  There are no identifiable barriers or psychosocial needs.;The patient should benefit from training in stress management and relaxation.      Screening Interventions   Interventions  Encouraged to exercise;Provide feedback about the scores to participant;To provide support and resources with identified psychosocial needs       Quality of Life Scores:   PHQ-9: Recent Review Flowsheet Data    Depression screen [facility] 2/9 03/28/2017 01/26/2017   Decreased Interest 0 0   Down, Depressed, Hopeless 0 0   PHQ - 2 Score 0 0   Altered sleeping 0 -   Tired, decreased energy 2 -   Change in appetite 2 -   Feeling bad or failure about yourself  0 -   Trouble concentrating 0 -   Moving slowly or fidgety/restless 0 -   Suicidal thoughts 0 -   PHQ-9 Score 4 -   Difficult doing work/chores Somewhat difficult -     Interpretation of Total Score  Total Score Depression Severity:  1-4 = Minimal depression, 5-9 = Mild depression, 10-14 = Moderate depression, 15-19 = Moderately severe depression, 20-27 = Severe depression   Psychosocial Evaluation and Intervention: Psychosocial Evaluation - 04/10/17 1100      Psychosocial Evaluation & Interventions   Interventions  Encouraged to exercise with the program and follow exercise prescription;Stress management education    Comments  Counselor met with Mr. Frimpong Mikki Santee) today for initial psychosocial evaluation.  He is an 83 year old who loves to travel.  He has some health issues with Asthma  and Emphysema that impact that somewhat.  Mikki Santee has a strong support system with a significant other of (6) years; and daughter who lives close by; a sister-in-law locally; and active involvement in his local church; [employer] and a 31 year [employer].  Mikki Santee reports sleeping  well and having a good appetite.  He denies a history of depression or anxiety or any current symptoms.  Mikki Santee states he is typically in a positive mood and other than his health issues - he has minimal stress in his life.  Mikki Santee has goals to improve his breathing and learn better ways to manage his illness while in this program.  He will be followed by staff.      Expected Outcomes  Mikki Santee will benefit from consistent exercise to achieve his stated goals.  The educational and psychoeducational components of this program will help Mikki Santee learn to manage and cope with his illnesses better.         Psychosocial Re-Evaluation: Psychosocial Re-Evaluation    [place] Name 04/19/17 1409 05/15/17 1635 05/29/17 1626         Psychosocial Re-Evaluation   Current issues with  None Identified;Current Stress Concerns  None Identified;Current Stress Concerns  Current Stress Concerns     Comments  Jahmai states that his home life is great. He wants to be able to breath better and feels like the program is helping him to do so. He said he eats, sleeps, watches TV and says his relationship is great. He is traveling to [place] next month and the [place] in November.  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 He states his relationship with his girlfriend is going very well and they have been togehter for 6 years. His shortness of breath and his chronic illness seems to be his only stressor.  [place] has felt more short of breath this past week. He states his home life is great. He and his girlfriend do everything together. He states that his daughter likes his girlfriend and lights up when he talked about it.     Expected  Outcomes  Short: exercise to improve shortness breath to minimize stress. Long: Maintain a stress free environment at home and with his breathing.  Short: attend [facility] regularly to improve stress. Long: Maintain a workout regimine to minimize stress   Short: continue rehab. Long: continue with a positive outlook post [facility].     Interventions  Encouraged to attend [facility] for the exercise;Stress management education  Encouraged to attend Pulmonary Rehabilitation for the exercise  Encouraged to attend Pulmonary Rehabilitation for the exercise     Continue Psychosocial Services   Follow up required by staff  Follow up required by staff  Follow up required by staff        Psychosocial Discharge (Final Psychosocial Re-Evaluation): Psychosocial Re-Evaluation - 05/29/17 1626      Psychosocial Re-Evaluation   Current issues with  Current Stress Concerns    Comments  Kraig has felt more short of breath this past week. He states his home life is great. He and his girlfriend do everything together. He states that his daughter likes his girlfriend and lights up when he talked about it.    Expected Outcomes  Short: continue rehab. Long: continue with a positive outlook post [facility].    Interventions  Encouraged to attend Pulmonary Rehabilitation for the exercise    Continue Psychosocial Services   Follow up required by staff       Education: Education Goals: Education classes will be provided on a weekly basis, covering required topics. Participant will state understanding/return demonstration of topics presented.  Learning Barriers/Preferences: Learning Barriers/Preferences - 03/28/17 1320      Learning Barriers/Preferences   Learning Barriers  None  Learning Preferences  None       Education Topics: Initial Evaluation Education: - Verbal, written and demonstration of respiratory meds, RPE/PD scales, oximetry and breathing techniques. Instruction on use of nebulizers  and MDIs: cleaning and proper use, rinsing mouth with steroid doses and importance of monitoring MDI activations.   Pulmonary Rehab from 06/07/2017 in [facility] Cardiac and Pulmonary Rehab  Date  03/28/17  Educator  Sb  Instruction Review Code  1- Verbalizes Understanding      General Nutrition Guidelines/Fats and Fiber: -Group instruction provided by verbal, written material, models and posters to present the general guidelines for heart healthy nutrition. Gives an explanation and review of dietary fats and fiber.   Pulmonary Rehab from 06/07/2017 in [facility] Cardiac and Pulmonary Rehab  Date  05/15/17  Educator  CR  Instruction Review Code  1- Verbalizes Understanding      Controlling Sodium/Reading Food Labels: -Group verbal and written material supporting the discussion of sodium use in heart healthy nutrition. Review and explanation with models, verbal and written materials for utilization of the food label.   Pulmonary Rehab from 06/07/2017 in [facility] Cardiac and Pulmonary Rehab  Date  05/22/17  Educator  CR  Instruction Review Code  1- Verbalizes Understanding      Exercise Physiology & Risk Factors: - Group verbal and written instruction with models to review the exercise physiology of the cardiovascular system and associated critical values. Details cardiovascular disease risk factors and the goals associated with each risk factor.   Aerobic Exercise & Resistance Training: - Gives group verbal and written discussion on the health impact of inactivity. On the components of aerobic and resistive training programs and the benefits of this training and how to safely progress through these programs.   Flexibility, Balance, General Exercise Guidelines: - Provides group verbal and written instruction on the benefits of flexibility and balance training programs. Provides general exercise guidelines with specific guidelines to those with heart or lung disease. Demonstration and skill practice  provided.   Stress Management: - Provides group verbal and written instruction about the health risks of elevated stress, cause of high stress, and healthy ways to reduce stress.   Pulmonary Rehab from 06/07/2017 in [facility] Cardiac and Pulmonary Rehab  Date  04/19/17  Educator  [facility]  Instruction Review Code  1- Verbalizes Understanding      Depression: - Provides group verbal and written instruction on the correlation between heart/lung disease and depressed mood, treatment options, and the stigmas associated with seeking treatment.   Exercise & Equipment Safety: - Individual verbal instruction and demonstration of equipment use and safety with use of the equipment.   Infection Prevention: - Provides verbal and written material to individual with discussion of infection control including proper hand washing and proper equipment cleaning during exercise session.   Pulmonary Rehab from 06/07/2017 in [facility] Cardiac and Pulmonary Rehab  Date  03/28/17  Educator  Sb  Instruction Review Code  1- Verbalizes Understanding      Falls Prevention: - Provides verbal and written material to individual with discussion of falls prevention and safety.   Pulmonary Rehab from 06/07/2017 in [facility] Cardiac and Pulmonary Rehab  Date  03/28/17  Educator  SB  Instruction Review Code  1- Verbalizes Understanding      Diabetes: - Individual verbal and written instruction to review signs/symptoms of diabetes, desired ranges of glucose level fasting, after meals and with exercise. Advice that pre and post exercise glucose checks will be done for 3  sessions at entry of program.   Pulmonary Rehab from 06/07/2017 in [facility] Cardiac and Pulmonary Rehab  Date  03/28/17  Educator  Sb  Instruction Review Code  1- Verbalizes Understanding      Chronic Lung Diseases: - Group verbal and written instruction to review new updates, new respiratory medications, new advancements in procedures and treatments. Provide  informative websites and "800" numbers of self-education.   Pulmonary Rehab from 06/07/2017 in [facility] Cardiac and Pulmonary Rehab  Date  05/03/17  Educator  [facility]  Instruction Review Code  1- Verbalizes Understanding      Lung Procedures: - Group verbal and written instruction to describe testing methods done to diagnose lung disease. Review the outcome of test results. Describe the treatment choices: Pulmonary Function Tests, ABGs and oximetry.   Energy Conservation: - Provide group verbal and written instruction for methods to conserve energy, plan and organize activities. Instruct on pacing techniques, use of adaptive equipment and posture/positioning to relieve shortness of breath.   Pulmonary Rehab from 06/07/2017 in [facility] Cardiac and Pulmonary Rehab  Date  04/12/17  Educator  [facility]  Instruction Review Code  1- Verbalizes Understanding      Triggers: - Group verbal and written instruction to review types of environmental controls: home humidity, furnaces, filters, dust mite/pet prevention, HEPA vacuums. To discuss weather changes, air quality and the benefits of nasal washing.   Pulmonary Rehab from 06/07/2017 in [facility] Cardiac and Pulmonary Rehab  Date  06/07/17  Educator  [facility]  Instruction Review Code  1- Verbalizes Understanding      Exacerbations: - Group verbal and written instruction to provide: warning signs, infection symptoms, calling MD promptly, preventive modes, and value of vaccinations. Review: effective airway clearance, coughing and/or vibration techniques. Create an Sports administrator.   Pulmonary Rehab from 06/07/2017 in [facility] Cardiac and Pulmonary Rehab  Date  06/07/17  Educator  [facility]  Instruction Review Code  1- Verbalizes Understanding      Oxygen: - Individual and group verbal and written instruction on oxygen therapy. Includes supplement oxygen, available portable oxygen systems, continuous and intermittent flow rates, oxygen safety, concentrators, and Medicare reimbursement  for oxygen.   Respiratory Medications: - Group verbal and written instruction to review medications for lung disease. Drug class, frequency, complications, importance of spacers, rinsing mouth after steroid MDI's, and proper cleaning methods for nebulizers.   AED/CPR: - Group verbal and written instruction with the use of models to demonstrate the basic use of the AED with the basic ABC's of resuscitation.   Breathing Retraining: - Provides individuals verbal and written instruction on purpose, frequency, and proper technique of diaphragmatic breathing and pursed-lipped breathing. Applies individual practice skills.   Pulmonary Rehab from 06/07/2017 in [facility] Cardiac and Pulmonary Rehab  Date  03/28/17  Educator  Sb  Instruction Review Code  1- Verbalizes Understanding      Anatomy and Physiology of the Lungs: - Group verbal and written instruction with the use of models to provide basic lung anatomy and physiology related to function, structure and complications of lung disease.   Anatomy & Physiology of the Heart: - Group verbal and written instruction and models provide basic cardiac anatomy and physiology, with the coronary electrical and arterial systems. Review of: AMI, Angina, Valve disease, Heart Failure, Cardiac Arrhythmia, Pacemakers, and the ICD.   Heart Failure: - Group verbal and written instruction on the basics of heart failure: signs/symptoms, treatments, explanation of ejection fraction, enlarged heart and cardiomyopathy.   Sleep Apnea: - Individual  verbal and written instruction to review Obstructive Sleep Apnea. Review of risk factors, methods for diagnosing and types of masks and machines for OSA.   Pulmonary Rehab from 06/07/2017 in [facility] Cardiac and Pulmonary Rehab  Date  06/07/17  Educator  [facility]  Instruction Review Code  1- Verbalizes Understanding      Anxiety: - Provides group, verbal and written instruction on the correlation between heart/lung disease and  anxiety, treatment options, and management of anxiety.   Relaxation: - Provides group, verbal and written instruction about the benefits of relaxation for patients with heart/lung disease. Also provides patients with examples of relaxation techniques.   Cardiac Medications: - Group verbal and written instruction to review commonly prescribed medications for heart disease. Reviews the medication, class of the drug, and side effects.   Know Your Numbers: -Group verbal and written instruction about important numbers in your health.  Review of Cholesterol, Blood Pressure, Diabetes, and BMI and the role they play in your overall health.   Other: -Provides group and verbal instruction on various topics (see comments)    Knowledge Questionnaire Score: Knowledge Questionnaire Score - 03/28/17 1320      Knowledge Questionnaire Score   Pre Score  6/10 reviewed correct response with Mikki Santee and he verbalized understanding of the response        Core Components/Risk Factors/Patient Goals at Admission: Personal Goals and Risk Factors at Admission - 03/28/17 1338      Core Components/Risk Factors/Patient Goals on Admission   Improve shortness of breath with ADL's  Yes    Intervention  Provide education, individualized exercise plan and daily activity instruction to help decrease symptoms of SOB with activities of daily living.    Expected Outcomes  Short Term: Achieves a reduction of symptoms when performing activities of daily living.  (Significant)  Long term goal: Improved ability to perform ADL's with less SOB    Develop more efficient breathing techniques such as purse lipped breathing and diaphragmatic breathing; and practicing self-pacing with activity  Yes    Intervention  Provide education, demonstration and support about specific breathing techniuqes utilized for more efficient breathing. Include techniques such as pursed lipped breathing, diaphragmatic breathing and self-pacing activity.     Expected Outcomes  Short Term: Participant will be able to demonstrate and use breathing techniques as needed throughout daily activities. Long term goal: Continued use of techniques learned to improve efficiency of breathing    Increase knowledge of respiratory medications and ability to use respiratory devices properly   Yes    Intervention  Provide education and demonstration as needed of appropriate use of medications, inhalers, and oxygen therapy.    Expected Outcomes  Short Term: Achieves understanding of medications use. Understands that oxygen is a medication prescribed by physician. Demonstrates appropriate use of inhaler and oxygen therapy. Long Term Goal: Maintains ability to utilize medications, inhalers and oxygen therapy    Diabetes  Yes    Intervention  Provide education about signs/symptoms and action to take for hypo/hyperglycemia.;Provide education about proper nutrition, including hydration, and aerobic/resistive exercise prescription along with prescribed medications to achieve blood glucose in normal ranges: Fasting glucose 65-99 mg/dL    Expected Outcomes  Short Term: Participant verbalizes understanding of the signs/symptoms and immediate care of hyper/hypoglycemia, proper foot care and importance of medication, aerobic/resistive exercise and nutrition plan for blood glucose control.;Long Term: Attainment of HbA1C < 7%.    Hypertension  Yes    Intervention  Provide education on lifestyle modifcations including regular physical  activity/exercise, weight management, moderate sodium restriction and increased consumption of fresh fruit, vegetables, and low fat dairy, alcohol moderation, and smoking cessation.;Monitor prescription use compliance.    Expected Outcomes  Short Term: Continued assessment and intervention until BP is < 140/30m HG in hypertensive participants. < 130/843mHG in hypertensive participants with diabetes, heart failure or chronic kidney disease.;Long Term:  Maintenance of blood pressure at goal levels.    Lipids  Yes    Intervention  Provide education and support for participant on nutrition & aerobic/resistive exercise along with prescribed medications to achieve LDL <7020mHDL >20m34m  Expected Outcomes  Short Term: Participant states understanding of desired cholesterol values and is compliant with medications prescribed. Participant is following exercise prescription and nutrition guidelines.;Long Term: Cholesterol controlled with medications as prescribed, with individualized exercise RX and with personalized nutrition plan. Value goals: LDL < 70mg79mL > 40 mg.       Core Components/Risk Factors/Patient Goals Review:  Goals and Risk Factor Review    Row Name 04/19/17 1355 05/15/17 1622 05/29/17 1638         Core Components/Risk Factors/Patient Goals Review   Personal Goals Review  Weight Management/Obesity;Lipids;Hypertension;Diabetes  Weight Management/Obesity;Improve shortness of breath with ADL's;Hypertension;Diabetes;Lipids;Increase knowledge of respiratory medications and ability to use respiratory devices properly.  Weight Management/Obesity;Improve shortness of breath with ADL's;Stress;Diabetes     Review  RoberPangbornht has been steady and wants to lose weight. He has not met with the nutritionist. His blood pressure has been under control since the start of the program. He does not know his last lipids results. Blood sugars have been checked and within acceptable ranges. He also checks his blood sugar regularly at home.  RoberJuleses he has been felling better and breathing a little easier. He takes his albuterol and his inhalers as prescribed but has no interest in using a spacer. His blood pressure has been stable since the start of the program. His weight has increase slightyl due to vacations he has been going on and has scheduled.  RoberChinedumd like to lose some weight and improve his shortness of breath. His stress is very minimal.  His diabetes has been under control and checks regularly at home.     Expected Outcomes  Short: Continue to lose weight. Long: Maintain weight after weight loss   Short: convince Masud to use a spacer with his inhaler. Long: Use spacer and take medications independently.  Short: attend [facility] regularly to lose weight. Long: maintain weightloss with an exercise routine.        Core Components/Risk Factors/Patient Goals at Discharge (Final Review):  Goals and Risk Factor Review - 05/29/17 1638      Core Components/Risk Factors/Patient Goals Review   Personal Goals Review  Weight Management/Obesity;Improve shortness of breath with ADL's;Stress;Diabetes    Review  RoberIdrissad like to lose some weight and improve his shortness of breath. His stress is very minimal. His diabetes has been under control and checks regularly at home.    Expected Outcomes  Short: attend [facility] regularly to lose weight. Long: maintain weightloss with an exercise routine.       ITP Comments: ITP Comments    Row Name 03/28/17 1230 03/28/17 1403 04/24/17 0831 04/26/17 1015 05/03/17 1221   ITP Comments  Medical review completed. INitial ITP created and sent to Dr Mark Emily Filbertsignature and suggested changes.   [facility]ation of diagnosis [facility]  Patient met with Dr. MarkElta Guadeloupe  Investment banker, [employer] of [place] today for face to face contact.  30 day review completed. ITP sent to Dr. Emily Filbert Director of [place]. Continue with ITP unless changes are made by physician.    Ethon was absent today and could not see Dr. Emily Filbert for face to face and chart review.  Suleyman seen Dr. Lequita Halt who signed for Dr. Emily Filbert Director of [place] for face to face and chart review   Row Name 05/22/17 0823 06/02/17 1011 06/05/17 1039 06/19/17 0835     ITP Comments  30 day review completed. ITP sent to Dr. Emily Filbert Director of [place]. Continue with ITP unless changes are made by physician.    Refugio  was not in [place] today to meet face to face with Dr. Sabra Heck Director of [place].  Met Dr. Sabra Heck director of [place] face to face for chart signature and review.  30 day review completed. ITP sent to Dr. Emily Filbert Director of [place]. Continue with ITP unless changes are made by physician.         Comments: 30 day review

## 2017-07-04 ENCOUNTER — Telehealth: Payer: Self-pay

## 2017-07-04 DIAGNOSIS — J449 Chronic obstructive pulmonary disease, unspecified: Secondary | ICD-10-CM

## 2017-07-04 NOTE — Telephone Encounter (Signed)
Called Cody Bridges to see how he was doing. He was on vacation and plans to return tomorrow.

## 2017-07-05 ENCOUNTER — Encounter: Payer: Medicare Other | Attending: Internal Medicine

## 2017-07-05 DIAGNOSIS — Z87891 Personal history of nicotine dependence: Secondary | ICD-10-CM | POA: Insufficient documentation

## 2017-07-05 DIAGNOSIS — I10 Essential (primary) hypertension: Secondary | ICD-10-CM | POA: Insufficient documentation

## 2017-07-05 DIAGNOSIS — Z79899 Other long term (current) drug therapy: Secondary | ICD-10-CM | POA: Diagnosis not present

## 2017-07-05 DIAGNOSIS — J449 Chronic obstructive pulmonary disease, unspecified: Secondary | ICD-10-CM | POA: Insufficient documentation

## 2017-07-05 DIAGNOSIS — E119 Type 2 diabetes mellitus without complications: Secondary | ICD-10-CM | POA: Diagnosis not present

## 2017-07-05 DIAGNOSIS — Z7984 Long term (current) use of oral hypoglycemic drugs: Secondary | ICD-10-CM | POA: Diagnosis not present

## 2017-07-05 NOTE — Progress Notes (Signed)
Daily Session Note  Patient Details  Name: Cody Bridges MRN: [id number] Date of Birth: May 08, 1933 Referring Provider:    Encounter Date: 07/05/2017  Check In: Session Check In - 07/05/17 1017      Check-In   Location  [facility]    Staff Present  Justin Mend RCP,RRT,BSRT;Amanda Oletta Darter, BA, ACSM CEP, Exercise Physiologist;Jessica Luan Pulling, MA, ACSM RCEP, Exercise Physiologist    Supervising physician immediately available to respond to emergencies  [facility] immediately available ER MD    Physician(s)  Dr. Joni Fears and Archie Balboa    Medication changes reported      No    Fall or balance concerns reported     No    Warm-up and Cool-down  Performed as group-led instruction    Resistance Training Performed  Yes    VAD Patient?  No      Pain Assessment   Currently in Pain?  No/denies          Social History   Tobacco Use  Smoking Status Former Smoker  . Packs/day: 2.00  . Years: 35.00  . Pack years: 70.00  . Types: Cigarettes  . Last attempt to quit: 10/30/1985  . Years since quitting: 31.7  Smokeless Tobacco Never Used    Goals Met:  Independence with exercise equipment Exercise tolerated well Personal goals reviewed No report of cardiac concerns or symptoms Strength training completed today  Goals Unmet:  Not Applicable  Comments: [name] met face to face with Dr. Emily Filbert director of [place], chart reviewed and signed.Pt able to follow exercise prescription today without complaint.  Will continue to monitor for progression.   Dr. Emily Filbert is Medical Director for [facility] and [facility].

## 2017-07-07 ENCOUNTER — Ambulatory Visit: Payer: Medicare Other

## 2017-07-10 ENCOUNTER — Ambulatory Visit: Payer: Medicare Other

## 2017-07-12 ENCOUNTER — Ambulatory Visit: Payer: Medicare Other

## 2017-07-14 ENCOUNTER — Ambulatory Visit: Payer: Medicare Other

## 2017-07-17 ENCOUNTER — Ambulatory Visit: Payer: Medicare Other

## 2017-07-17 DIAGNOSIS — J449 Chronic obstructive pulmonary disease, unspecified: Secondary | ICD-10-CM

## 2017-07-17 NOTE — Progress Notes (Signed)
Pulmonary Individual Treatment Plan  Patient Details  Name: Cody Bridges MRN: [id number] Date of Birth: [birth year] Referring Provider:    Initial Encounter Date:    Pulmonary Rehab from 03/28/2017 in [facility]  Date  03/28/17      Visit Diagnosis: Chronic obstructive pulmonary disease, unspecified COPD type ([place])  Patient's Home Medications on Admission:  Current Outpatient Medications:  .  ACCU-CHEK AVIVA test strip, TEST EVERY DAY, Disp: , Rfl: 5 .  ACCU-CHEK SOFTCLIX LANCETS lancets, daily., Disp: , Rfl: 5 .  calcium carbonate (TUMS - DOSED IN MG ELEMENTAL CALCIUM) 500 MG chewable tablet, Chew 1-2 tablets by mouth 3 (three) times daily as needed (for acid reflux/indigestion.)., Disp: , Rfl:  .  Cholecalciferol (VITAMIN D-3) 5000 units TABS, Take 5,000 Units by mouth daily., Disp: , Rfl:  .  Fluticasone-Umeclidin-Vilant (TRELEGY ELLIPTA) 100-62.5-25 MCG/INH AEPB, Inhale 1 puff into the lungs daily., Disp: 60 each, Rfl: 5 .  hydrochlorothiazide (HYDRODIURIL) 25 MG tablet, Take 25 mg by mouth daily., Disp: , Rfl:  .  lisinopril (PRINIVIL,ZESTRIL) 5 MG tablet, Take 5 mg by mouth daily., Disp: , Rfl:  .  metFORMIN (GLUCOPHAGE) 500 MG tablet, Take 500 mg by mouth 2 (two) times daily., Disp: , Rfl:  .  Multiple Vitamin (MULTIVITAMIN WITH MINERALS) TABS tablet, Take 1 tablet by mouth daily., Disp: , Rfl:  .  PROAIR HFA 108 (90 Base) MCG/ACT inhaler, Inhale 1-2 puffs into the lungs every 6 (six) hours as needed (for wheezing/shortness of breath). , Disp: , Rfl:  .  rosuvastatin (CRESTOR) 20 MG tablet, Take 20 mg by mouth daily., Disp: , Rfl:  .  triamcinolone ointment (KENALOG) 0.1 %, Apply 1 application topically 2 (two) times daily as needed (for dry skin/irritation.)., Disp: , Rfl:   Past Medical History: Past Medical History:  Diagnosis Date  . Arthritis   . Asthma   . Diabetes mellitus without complication ([place])     5956  . Dyspnea    with exertion  .  GERD (gastroesophageal reflux disease)   . Hyperlipidemia   . Hypertension     Tobacco Use: Social History   Tobacco Use  Smoking Status Former Smoker  . Packs/day: 2.00  . Years: 35.00  . Pack years: 70.00  . Types: Cigarettes  . Last attempt to quit: 10/30/1985  . Years since quitting: 31.7  Smokeless Tobacco Never Used    Labs: Recent Review Flowsheet Data    Labs for ITP Cardiac and Pulmonary Rehab 10/24/2016 01/26/2017 05/01/2017   Hemoglobin A1c 6.8(H) 6.5 6.3       Pulmonary Assessment Scores: Pulmonary Assessment Scores    Row Name 03/28/17 1330 07/05/17 1052       ADL UCSD   ADL Phase  Entry  Mid    SOB Score total  76  49    Rest  2  1    Walk  3  3    Stairs  4  4    Bath  2  1    Dress  2  4    Shop  3  4      CAT Score   CAT Score  25  -      mMRC Score   mMRC Score  1  -       Pulmonary Function Assessment: Pulmonary Function Assessment - 03/28/17 1348      Pulmonary Function Tests   FVC%  78 % Test performed 12/15/2016  FEV1%  59 %    FEV1/FVC Ratio  59      Breath   Shortness of Breath  Yes;Fear of Shortness of Breath;Limiting activity       Exercise Target Goals:    Exercise Program Goal: Individual exercise prescription set with THRR, safety & activity barriers. Participant demonstrates ability to understand and report RPE using BORG scale, to self-measure pulse accurately, and to acknowledge the importance of the exercise prescription.  Exercise Prescription Goal: Starting with aerobic activity 30 plus minutes a day, 3 days per week for initial exercise prescription. Provide home exercise prescription and guidelines that participant acknowledges understanding prior to discharge.  Activity Barriers & Risk Stratification: Activity Barriers & Cardiac Risk Stratification - 03/28/17 1236      Activity Barriers & Cardiac Risk Stratification   Activity Barriers  Arthritis;Back Problems;Shortness of Breath;Muscular Weakness Low back  pain, arthritis hands and other joints, legs weak, SOB from chronic lung disease       6 Minute Walk: 6 Minute Walk    Row Name 03/28/17 1332         6 Minute Walk   Distance  950 feet     Walk Time  6 minutes     # of Rest Breaks  0     MPH  1.79     METS  1.86     RPE  15     Perceived Dyspnea   2     VO2 Peak  6.5     Symptoms  Yes (comment)     Comments  back pain 7/10     Resting HR  95 bpm     Resting BP  122/68     Resting Oxygen Saturation   96 %     Exercise Oxygen Saturation  during 6 min walk  95 %     Max Ex. HR  132 bpm     Max Ex. BP  132/56     2 Minute Post BP  116/70       Interval HR   1 Minute HR  114     2 Minute HR  120     3 Minute HR  132     4 Minute HR  124     5 Minute HR  121     6 Minute HR  123     2 Minute Post HR  85     Interval Heart Rate?  Yes       Interval Oxygen   Interval Oxygen?  Yes     Baseline Oxygen Saturation %  96 %     1 Minute Oxygen Saturation %  95 %     1 Minute Liters of Oxygen  0 L     2 Minute Oxygen Saturation %  95 %     2 Minute Liters of Oxygen  0 L     3 Minute Oxygen Saturation %  95 %     3 Minute Liters of Oxygen  0 L     4 Minute Oxygen Saturation %  95 %     4 Minute Liters of Oxygen  0 L     5 Minute Oxygen Saturation %  96 %     5 Minute Liters of Oxygen  0 L     6 Minute Oxygen Saturation %  96 %     6 Minute Liters of Oxygen  0 L     2 Minute  Post Oxygen Saturation %  97 %     2 Minute Post Liters of Oxygen  0 L       Oxygen Initial Assessment: Oxygen Initial Assessment - 03/28/17 1231      Home Oxygen   Home Oxygen Device  None    Sleep Oxygen Prescription  None    Home Exercise Oxygen Prescription  None    Home at Rest Exercise Oxygen Prescription  None      Initial 6 min Walk   Oxygen Used  None      Program Oxygen Prescription   Program Oxygen Prescription  None      Intervention   Short Term Goals  To learn and understand importance of monitoring SPO2 with pulse oximeter  and demonstrate accurate use of the pulse oximeter.;To learn and demonstrate proper pursed lip breathing techniques or other breathing techniques.;To learn and understand importance of maintaining oxygen saturations>88%;To learn and demonstrate proper use of respiratory medications    Long  Term Goals  Verbalizes importance of monitoring SPO2 with pulse oximeter and return demonstration;Exhibits proper breathing techniques, such as pursed lip breathing or other method taught during program session;Demonstrates proper use of MDI's;Compliance with respiratory medication;Maintenance of O2 saturations>88%       Oxygen Re-Evaluation: Oxygen Re-Evaluation    Row Name 04/10/17 1040 04/19/17 1349 05/15/17 1626 05/29/17 1604 07/05/17 1102     Program Oxygen Prescription   Program Oxygen Prescription  -  None  None  None  None     Home Oxygen   Home Oxygen Device  None  None  None  None  None   Sleep Oxygen Prescription  None  None  None  None  None   Home Exercise Oxygen Prescription  None  None  None  None  None   Home at Rest Exercise Oxygen Prescription  None  None  None  None  None     Goals/Expected Outcomes   Short Term Goals  To learn and understand importance of monitoring SPO2 with pulse oximeter and demonstrate accurate use of the pulse oximeter.;To learn and demonstrate proper pursed lip breathing techniques or other breathing techniques.;To learn and understand importance of maintaining oxygen saturations>88%;To learn and demonstrate proper use of respiratory medications  To learn and understand importance of maintaining oxygen saturations>88%;To learn and demonstrate proper use of respiratory medications;To learn and demonstrate proper pursed lip breathing techniques or other breathing techniques.;To learn and understand importance of monitoring SPO2 with pulse oximeter and demonstrate accurate use of the pulse oximeter.  To learn and understand importance of maintaining oxygen  saturations>88%;To learn and demonstrate proper pursed lip breathing techniques or other breathing techniques.;To learn and understand importance of monitoring SPO2 with pulse oximeter and demonstrate accurate use of the pulse oximeter.;To learn and demonstrate proper use of respiratory medications  To learn and understand importance of maintaining oxygen saturations>88%;To learn and demonstrate proper pursed lip breathing techniques or other breathing techniques.;To learn and understand importance of monitoring SPO2 with pulse oximeter and demonstrate accurate use of the pulse oximeter.;To learn and demonstrate proper use of respiratory medications  To learn and understand importance of maintaining oxygen saturations>88%;To learn and demonstrate proper pursed lip breathing techniques or other breathing techniques.;To learn and understand importance of monitoring SPO2 with pulse oximeter and demonstrate accurate use of the pulse oximeter.;To learn and demonstrate proper use of respiratory medications   Long  Term Goals  Maintenance of O2 saturations>88%;Compliance with respiratory medication;Verbalizes importance of monitoring SPO2 with pulse  oximeter and return demonstration;Exhibits proper breathing techniques, such as pursed lip breathing or other method taught during program session;Demonstrates proper use of MDI's  Verbalizes importance of monitoring SPO2 with pulse oximeter and return demonstration;Exhibits proper breathing techniques, such as pursed lip breathing or other method taught during program session;Demonstrates proper use of MDI's;Compliance with respiratory medication;Maintenance of O2 saturations>88%  Maintenance of O2 saturations>88%;Compliance with respiratory medication;Demonstrates proper use of MDI's;Exhibits proper breathing techniques, such as pursed lip breathing or other method taught during program session;Verbalizes importance of monitoring SPO2 with pulse oximeter and return  demonstration  Maintenance of O2 saturations>88%;Compliance with respiratory medication;Demonstrates proper use of MDI's;Exhibits proper breathing techniques, such as pursed lip breathing or other method taught during program session;Verbalizes importance of monitoring SPO2 with pulse oximeter and return demonstration  Maintenance of O2 saturations>88%;Compliance with respiratory medication;Demonstrates proper use of MDI's;Exhibits proper breathing techniques, such as pursed lip breathing or other method taught during program session;Verbalizes importance of monitoring SPO2 with pulse oximeter and return demonstration   Comments  Reviewed PLB technique with pt.  Talked about how it work and it's important to maintaining his exercise saturations.    Informed Cody Bridges about the effects of COPD, He does not have a pulse oximeter at home to check his oxygen. Informed him how to obtain one and to make sure his oxygen is above 88 percent. He needs alot of imrpovement on PLB techniques.  Cody Bridges states that he checks his blood  pressure and his heart rate at home regularly. He does not show an interest in purchasing a pulse oximeter to check his oxygen saturation. He does not want to use a spacer for his inhalers either. His oxygen levels have been above 88 percent while exercising.  His PLB has got better but still could use practice.  Cody Bridges states that he has been a little more short of breath this past week. He states he gets sore from exercising and usually wont attend until he is not sore. Informed him that he should come more frequently to help with his soreness and his shortness of breath.  Cody Bridges has been out on vacation and has returned to [place] Bridges today. He has been taking his medications as prescribed. Informed Cody Bridges to try to attend [facility] more frequently.   Goals/Expected Outcomes  Short: Become more profiecient at using PLB.   Long: Become independent at using PLB.  Short:obtatin a pulse oximeter and  work on PLB. Long: Check and maintain an oxygen saturation greater than 88 precent. Be proficient with PLB  Short: convince [place] to use a spacer with his medications. Long: Independently use a spacer with his medications  Short: attened [facility] regulary. Long: maintain an exercise regimine to help with shortness of breath.  Short: attened [facility] regulary. Long: maintain an exercise regimine to help with shortness of breath.      Oxygen Discharge (Final Oxygen Re-Evaluation): Oxygen Re-Evaluation - 07/05/17 1102      Program Oxygen Prescription   Program Oxygen Prescription  None      Home Oxygen   Home Oxygen Device  None    Sleep Oxygen Prescription  None    Home Exercise Oxygen Prescription  None    Home at Rest Exercise Oxygen Prescription  None      Goals/Expected Outcomes   Short Term Goals  To learn and understand importance of maintaining oxygen saturations>88%;To learn and demonstrate proper pursed lip breathing techniques or other breathing techniques.;To learn and understand importance of monitoring SPO2 with pulse oximeter  and demonstrate accurate use of the pulse oximeter.;To learn and demonstrate proper use of respiratory medications    Long  Term Goals  Maintenance of O2 saturations>88%;Compliance with respiratory medication;Demonstrates proper use of MDI's;Exhibits proper breathing techniques, such as pursed lip breathing or other method taught during program session;Verbalizes importance of monitoring SPO2 with pulse oximeter and return demonstration    Comments  Cody Bridges has been out on vacation and has returned to [place] today. He has been taking his medications as prescribed. Informed Y[place] to try to attend [facility] more frequently.    Goals/Expected Outcomes  Short: attened [facility] regulary. Long: maintain an exercise regimine to help with shortness of breath.       Initial Exercise Prescription: Initial Exercise Prescription - 03/28/17 1300      Date of  Initial Exercise RX and Referring Provider   Date  03/28/17      Treadmill   MPH  1.5    Grade  0    Minutes  15    METs  2.15      Recumbant Bike   Level  1    RPM  60    Watts  5    Minutes  15    METs  1.8      NuStep   Level  2    SPM  80    Minutes  15    METs  2      Arm Ergometer   Level  1    RPM  50    Minutes  15    METs  1.6      Prescription Details   Frequency (times per week)  3    Duration  Progress to 45 minutes of aerobic exercise without signs/symptoms of physical distress      Intensity   THRR 40-80% of Max Heartrate  112-129    Ratings of Perceived Exertion  11-15    Perceived Dyspnea  0-4      Progression   Progression  Continue to progress workloads to maintain intensity without signs/symptoms of physical distress.      Resistance Training   Training Prescription  Yes    Bridges  3    Reps  10-15       Perform Capillary Blood Glucose checks as needed.  Exercise Prescription Changes: Exercise Prescription Changes    Row Name 03/28/17 1300 04/12/17 1500 04/26/17 1500 05/03/17 1400 05/10/17 1000     Response to Exercise   Blood Pressure (Admit)  122/68  132/64  -  -  124/64   Blood Pressure (Exercise)  132/56  -  -  -  -   Blood Pressure (Exit)  116/70  128/68  132/84  -  -   Heart Rate (Admit)  95 bpm  95 bpm  -  -  98 bpm   Heart Rate (Exercise)  132 bpm  96 bpm  121 bpm  -  123 bpm   Heart Rate (Exit)  85 bpm  56 bpm  99 bpm  -  -   Oxygen Saturation (Admit)  96 %  95 %  97 %  -  94 %   Oxygen Saturation (Exercise)  95 %  95 %  95 %  -  95 %   Oxygen Saturation (Exit)  97 %  97 %  95 %  -  -   Rating of Perceived Exertion (Exercise)  _0 -  14  Perceived Dyspnea (Exercise)  _0 -  3   Symptoms  back pain 7/10  none  none  -  none   Duration  Progress to 45 minutes of aerobic exercise without signs/symptoms of physical distress  Progress to 45 minutes of aerobic exercise without signs/symptoms of physical distress   Progress to 45 minutes of aerobic exercise without signs/symptoms of physical distress  -  Continue with 45 min of aerobic exercise without signs/symptoms of physical distress.   Intensity  -  -  -  -  THRR unchanged     Progression   Progression  -  Continue to progress workloads to maintain intensity without signs/symptoms of physical distress.  Continue to progress workloads to maintain intensity without signs/symptoms of physical distress.  Continue to progress workloads to maintain intensity without signs/symptoms of physical distress.  Continue to progress workloads to maintain intensity without signs/symptoms of physical distress.   Average METs  -  1._1 Resistance Training   Training Prescription  -  Yes  Yes  Yes  Yes   Bridges  -  _2 Reps  -  10-15  10-15  10-15  10-15     Interval Training   Interval Training  -  No  No  No  No     Treadmill   MPH  -  -  1.5  1.5  1.5   Grade  -  -  0  0  0   Minutes  -  -  _3 METs  -  -  2.15  2.15  2.15     NuStep   Level  -  _4 SPM  -  72  72  72  69   Minutes  -  _5 METs  -  _6 Arm Ergometer   Level  -  _7 1.2   RPM  -  43  41  41  -   Minutes  -  _8 METs  -  1._9 -     Home Exercise Plan   Plans to continue exercise at  -  -  -  Home (comment) at his appartment complex  -   Frequency  -  -  -  Add 1 additional day to program exercise sessions.  -   Initial Home Exercises Provided  -  -  -  05/03/17  -   [place] Name 05/24/17 1400 06/07/17 1200 06/07/17 1300 06/21/17 1000 07/05/17 1200     Response to Exercise   Blood Pressure (Admit)  126/62  134/64  -  132/70  114/60   Blood Pressure (Exit)  124/76  130/68  -  118/70  108/60   Heart Rate (Admit)  93 bpm  103 bpm  -  -  91 bpm   Heart Rate (Exercise)  104 bpm  126 bpm  -  123 bpm  112 bpm   Heart Rate (Exit)  81 bpm  -  -  78 bpm  93 bpm   Oxygen Saturation (Admit)  99 %  97 %  -  -  95 %   Oxygen Saturation (Exercise)  97 %  96 %  -  96 %  96 %   Oxygen Saturation (Exit)  95 %  -  -  94 %  96 %   Rating of Perceived Exertion (Exercise)  13  12  -  13  13   Perceived Dyspnea (Exercise)  2  2  -  3  3   Symptoms  none  none  -  none  none   Duration  Continue with 45 min of aerobic exercise without signs/symptoms of physical distress.  Continue with 45 min of aerobic exercise without signs/symptoms of physical distress.  -  Continue with 45 min of aerobic exercise without signs/symptoms of physical distress.  Continue with 45 min of aerobic exercise without signs/symptoms of physical distress.   Intensity  THRR unchanged  THRR unchanged  -  THRR unchanged  THRR unchanged     Progression   Progression  Continue to progress workloads to maintain intensity without signs/symptoms of physical distress.  Continue to progress workloads to maintain intensity without signs/symptoms of physical distress.  -  Continue to progress workloads to maintain intensity without signs/symptoms of physical distress.  Continue to progress workloads to maintain intensity without signs/symptoms of physical distress.   Average METs  2.2  2.6  -  2.5  2.2     Resistance Training   Training Prescription  Yes  Yes  -  Yes  Yes   Bridges  3  3 lb  -  4 lb  4 lb   Reps  10-15  10-15  -  10-15  10-15     Interval Training   Interval Training  No  No  -  -  -     Treadmill   MPH  1.5  1.5  -  1.8  1.7   Grade  0  0  -  0  0   Minutes  15  15  -  15  15   METs  2.15  2.15  -  2.38  2.3     NuStep   Level  2  4  -  4  -   SPM  79  95  -  98  -   Minutes  15  15  -  15  -   METs  2  3.1  -  3  -     Arm Ergometer   Level  -  -  -  2  2   Minutes  -  -  -  15  15   METs  -  -  -  2.2  2.1     Home Exercise Plan   Plans to continue exercise at  -  -  Home (comment) at his appartment complex  Home (comment) at his appartment complex  -   Frequency  -  -  Add 1 additional day to program exercise  sessions.  Add 1 additional day to program exercise sessions.  -   Initial Home Exercises Provided  -  -  05/03/17  05/03/17  -      Exercise Comments: Exercise Comments    Row Name 04/10/17 1046 05/03/17 1453         Exercise Comments  First full day of exercise!  Patient was oriented to gym and equipment including functions, settings, policies, and procedures.  Patient's individual exercise prescription and treatment plan were reviewed.  All  starting workloads were established based on the results of the 6 minute walk test done at initial orientation visit.  The plan for exercise progression was also introduced and progression will be customized based on patient's performance and goals.  Reviewed home exercise with pt today.  Pt plans to use the fitness center at his appartment for exercise.  Reviewed THR, pulse, RPE, sign and symptoms, NTG use, and when to call 911 or MD.  Also discussed weather considerations and indoor options.  Pt voiced understanding.         Exercise Goals and Review: Exercise Goals    Row Name 03/28/17 1336             Exercise Goals   Increase Physical Activity  Yes       Intervention  Provide advice, education, support and counseling about physical activity/exercise needs.;Develop an individualized exercise prescription for aerobic and resistive training based on initial evaluation findings, risk stratification, comorbidities and participant's personal goals.       Expected Outcomes  Achievement of increased cardiorespiratory fitness and enhanced flexibility, muscular endurance and strength shown through measurements of functional capacity and personal statement of participant.       Increase Strength and Stamina  Yes       Intervention  Provide advice, education, support and counseling about physical activity/exercise needs.;Develop an individualized exercise prescription for aerobic and resistive training based on initial evaluation findings, risk  stratification, comorbidities and participant's personal goals.       Expected Outcomes  Achievement of increased cardiorespiratory fitness and enhanced flexibility, muscular endurance and strength shown through measurements of functional capacity and personal statement of participant.       Able to understand and use rate of perceived exertion (RPE) scale  Yes       Intervention  Provide education and explanation on how to use RPE scale       Expected Outcomes  Short Term: Able to use RPE daily in rehab to express subjective intensity level;Long Term:  Able to use RPE to guide intensity level when exercising independently       Able to understand and use Dyspnea scale  Yes       Intervention  Provide education and explanation on how to use Dyspnea scale       Expected Outcomes  Short Term: Able to use Dyspnea scale daily in rehab to express subjective sense of shortness of breath during exertion;Long Term: Able to use Dyspnea scale to guide intensity level when exercising independently       Knowledge and understanding of Target Heart Rate Range (THRR)  Yes       Intervention  Provide education and explanation of THRR including how the numbers were predicted and where they are located for reference       Expected Outcomes  Short Term: Able to state/look up THRR;Long Term: Able to use THRR to govern intensity when exercising independently;Short Term: Able to use daily as guideline for intensity in rehab       Able to check pulse independently  Yes       Intervention  Provide education and demonstration on how to check pulse in carotid and radial arteries.;Review the importance of being able to check your own pulse for safety during independent exercise       Expected Outcomes  Short Term: Able to explain why pulse checking is important during independent exercise;Long Term: Able to check pulse independently and accurately  Understanding of Exercise Prescription  Yes       Intervention  Provide  education, explanation, and written materials on patient's individual exercise prescription       Expected Outcomes  Short Term: Able to explain program exercise prescription;Long Term: Able to explain home exercise prescription to exercise independently          Exercise Goals Re-Evaluation : Exercise Goals Re-Evaluation    Row Name 04/12/17 1504 04/26/17 1544 05/03/17 1454 05/10/17 1058 05/24/17 1454     Exercise Goal Re-Evaluation   Exercise Goals Review  Increase Physical Activity;Increase Strength and Stamina  Increase Physical Activity;Increase Strength and Stamina  Increase Physical Activity;Increase Strength and Stamina;Able to understand and use rate of perceived exertion (RPE) scale;Able to check pulse independently;Knowledge and understanding of Target Heart Rate Range (THRR);Understanding of Exercise Prescription;Able to understand and use Dyspnea scale  Increase Physical Activity;Increase Strength and Stamina;Understanding of Exercise Prescription  Increase Physical Activity;Increase Strength and Stamina   Comments  Cody Bridges has tolerated exercise well.  He had some muscle soreness after his first session.  Cody Bridges has tolerated exercise well.  He needs to attend 2-3 days per week to improve.  Staff will remind him to attend regularly.  Reviewed home exercise with pt today.  Pt plans to use the fitness center at his appartment for exercise.  Reviewed THR, pulse, RPE, sign and symptoms, NTG use, and when to call 911 or MD.  Also discussed weather considerations and indoor options.  Pt voiced understanding.  Cody Bridges has sporadic attendance at class.  He would see better results with regular attendance 2-3 times per week.  Staff will remind Cody Bridges of the benefits of regular exercise.  Cody Bridges has been out of town.  he would benefit from more frequent attendance.  Staff reviewed the benefits of exercising 3 times per week.   Expected Outcomes  Short - Cody Bridges will continue to attend regularly.  Long - Cody Bridges  will progress with exercise.  Short - Cody Bridges will attend 2-3 days per week.  Long - Cody Bridges will see imporvement in overall MET and fitness level.  Short: add 1 day of exercise to his daily routine. Long: Increase number of days for exercise.  Short - Cody Bridges will attend at leat 2 times per week.  Long - Cody Bridges will progress more with regular attendance.  Short - Cody Bridges will attend at least 2 x per week. Long - Cody Bridges will progress more with exercise.   [place] Name 06/07/17 1302 06/21/17 1039 07/05/17 1237         Exercise Goal Re-Evaluation   Exercise Goals Review  Increase Physical Activity;Increase Strength and Stamina  Increase Physical Activity;Increase Strength and Stamina;Able to understand and use rate of perceived exertion (RPE) scale;Able to understand and use Dyspnea scale  Increase Physical Activity;Increase Strength and Stamina     Comments  Cody Bridges is tolerating exercise well.  Staff will continue to monitor.  Cody Bridges is tolerating exercise well.  He would see better progresion with regular attendance.  He has been travelling frequently.  Today was pt first day back after a vacation of 3 weeks.  He reduced speed on TM for today.       Expected Outcomes  Short - Cody Bridges will exercise regularly even when travelling.  Long - Cody Bridges will maintain exercise on his own.  Short - Cody Bridges will attend LW at least twice per week.  Long - Cody Bridges will exercise regularly.  Short - Cody Bridges will build to former  levels Long - Cody Bridges will continue to progress MET level        Discharge Exercise Prescription (Final Exercise Prescription Changes): Exercise Prescription Changes - 07/05/17 1200      Response to Exercise   Blood Pressure (Admit)  114/60    Blood Pressure (Exit)  108/60    Heart Rate (Admit)  91 bpm    Heart Rate (Exercise)  112 bpm    Heart Rate (Exit)  93 bpm    Oxygen Saturation (Admit)  95 %    Oxygen Saturation (Exercise)  96 %    Oxygen Saturation (Exit)  96 %    Rating of Perceived Exertion (Exercise)  13    Perceived Dyspnea  (Exercise)  3    Symptoms  none    Duration  Continue with 45 min of aerobic exercise without signs/symptoms of physical distress.    Intensity  THRR unchanged      Progression   Progression  Continue to progress workloads to maintain intensity without signs/symptoms of physical distress.    Average METs  2.2      Resistance Training   Training Prescription  Yes    Bridges  4 lb    Reps  10-15      Treadmill   MPH  1.7    Grade  0    Minutes  15    METs  2.3      Arm Ergometer   Level  2    Minutes  15    METs  2.1       Nutrition:  Target Goals: Understanding of nutrition guidelines, daily intake of sodium <157m, cholesterol <2016m calories 30% from fat and 7% or less from saturated fats, daily to have 5 or more servings of fruits and vegetables.  Biometrics: Pre Biometrics - 03/28/17 1330      Pre Biometrics   Height  5' 10.5" (1.791 m)    Bridges  211 lb 4.8 oz (95.8 kg)    Waist Circumference  46 inches    Hip Circumference  44.5 inches    Waist to Hip Ratio  1.03 %    BMI (Calculated)  29.88        Nutrition Therapy Plan and Nutrition Goals: Nutrition Therapy & Goals - 05/29/17 1608      Nutrition Therapy   RD appointment defered  Yes      Personal Nutrition Goals   Comments  patient declines to see the dietician       Nutrition Discharge: Rate Your Plate Scores:   Nutrition Goals Re-Evaluation: Nutrition Goals Re-Evaluation    Cody Bridges 04/19/17 1403 05/15/17 1631 05/29/17 1608         Goals   Current Bridges  215 lb (97.5 kg)  217 lb (98.4 kg)  215 lb (97.5 kg)     Nutrition Goal  Patient declined to see the nutritionist  Lose Bridges and increase strength and stamina.  Eat healthier and lose Bridges     Comment  Cody Bridges will continued to be monitored and inform him that he can meet with a dietician is he wants to.  RoMaikelas not lost Bridges but has gained a few pounds [place] not want to meet with the dietician.   RoParmviras lost 2 pounds since his last weigh in. He has been vacationing and is starting to not eat as much. He states that he is pickey with what he ears at times.  Expected Outcome  Short: lose Bridges using exercise. Long: Maintain weightloss with exercise at home.  Short: Lose a few pounds before his next vacation. Long; maintain a nutrition and weightloss program.  Short: eat a heart healthy diet. Long: Adhere to a diet plan.        Nutrition Goals Discharge (Final Nutrition Goals Re-Evaluation): Nutrition Goals Re-Evaluation - 05/29/17 1608      Goals   Current Bridges  215 lb (97.5 kg)    Nutrition Goal  Eat healthier and lose Bridges    Comment  Cody Bridges has lost 2 pounds since his last weigh in. He has been vacationing and is starting to not eat as much. He states that he is pickey with what he ears at times.    Expected Outcome  Short: eat a heart healthy diet. Long: Adhere to a diet plan.       Psychosocial: Target Goals: Acknowledge presence or absence of significant depression and/or stress, maximize coping skills, provide positive support system. Participant is able to verbalize types and ability to use techniques and skills needed for reducing stress and depression.   Initial Review & Psychosocial Screening: Initial Psych Review & Screening - 03/28/17 1239      Initial Review   Current issues with  None Identified HAd depression when told he possible had cancer.  CAncer was ruled out with a biospy and depression is gone.       Family Dynamics   Good Support System?  Yes Cody Bridges, his significant other      Barriers   Psychosocial barriers to participate in program  There are no identifiable barriers or psychosocial needs.;The patient should benefit from training in stress management and relaxation.      Screening Interventions   Interventions  Encouraged to exercise;Provide feedback about the scores to participant;To provide support and resources with identified  psychosocial needs       Quality of Life Scores:   PHQ-9: Recent Review Flowsheet Data    Depression screen [facility] 2/9 07/05/2017 03/28/2017 01/26/2017   Decreased Interest 2 0 0   Down, Depressed, Hopeless 0 0 0   PHQ - 2 Score 2 0 0   Altered sleeping 0 0 -   Tired, decreased energy 2 2 -   Change in appetite 1 2 -   Feeling bad or failure about yourself  0 0 -   Trouble concentrating 2 0 -   Moving slowly or fidgety/restless 0 0 -   Suicidal thoughts 0 0 -   PHQ-9 Score 7 4 -   Difficult doing work/chores Not difficult at all Somewhat difficult -     Interpretation of Total Score  Total Score Depression Severity:  1-4 = Minimal depression, 5-9 = Mild depression, 10-14 = Moderate depression, 15-19 = Moderately severe depression, 20-27 = Severe depression   Psychosocial Evaluation and Intervention: Psychosocial Evaluation - 04/10/17 1100      Psychosocial Evaluation & Interventions   Interventions  Encouraged to exercise with the program and follow exercise prescription;Stress management education    Comments  Counselor met with Cody. Segreto Cody Bridges) today for initial psychosocial evaluation.  He is an 83 year old who loves to travel.  He has some health issues with Asthma and Emphysema that impact that somewhat.  Cody Bridges has a strong support system with a significant other of (6) years; and daughter who lives close by; a sister-in-law locally; and active involvement in his local church; [employer] and a 87 year [employer].  Cody Bridges reports sleeping well and having a good appetite.  He denies a history of depression or anxiety or any current symptoms.  Cody Bridges states he is typically in a positive mood and other than his health issues - he has minimal stress in his life.  Cody Bridges has goals to improve his breathing and learn better ways to manage his illness while in this program.  He will be followed by staff.      Expected Outcomes  Cody Bridges will benefit from consistent exercise to achieve his stated goals.  The  educational and psychoeducational components of this program will help Cody Bridges learn to manage and cope with his illnesses better.         Psychosocial Re-Evaluation: Psychosocial Re-Evaluation    [place] Name 04/19/17 1409 05/15/17 1635 05/29/17 1626         Psychosocial Re-Evaluation   Current issues with  None Identified;Current Stress Concerns  None Identified;Current Stress Concerns  Current Stress Concerns     Comments  Cody Bridges states that his home life is great. He wants to be able to breath better and feels like the program is helping him to do so. He said he eats, sleeps, watches TV and says his relationship is great. He is traveling to [place] next month and the [place] in November.  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 He states his relationship with his girlfriend is going very well and they have been togehter for 6 years. His shortness of breath and his chronic illness seems to be his only stressor.  Cody Bridges has felt more short of breath this past week. He states his home life is great. He and his girlfriend do everything together. He states that his daughter likes his girlfriend and lights up when he talked about it.     Expected Outcomes  Short: exercise to improve shortness breath to minimize stress. Long: Maintain a stress free environment at home and with his breathing.  Short: attend [facility] regularly to improve stress. Long: Maintain a workout regimine to minimize stress   Short: continue rehab. Long: continue with a positive outlook post [facility].     Interventions  Encouraged to attend [facility] for the exercise;Stress management education  Encouraged to attend [facility] for the exercise  Encouraged to attend [facility] for the exercise     Continue Psychosocial Services   Follow up required by staff  Follow up required by staff  Follow up required by staff        Psychosocial Discharge  (Final Psychosocial Re-Evaluation): Psychosocial Re-Evaluation - 05/29/17 1626      Psychosocial Re-Evaluation   Current issues with  Current Stress Concerns    Comments  Dirk has felt more short of breath this past week. He states his home life is great. He and his girlfriend do everything together. He states that his daughter likes his girlfriend and lights up when he talked about it.    Expected Outcomes  Short: continue rehab. Long: continue with a positive outlook post [facility].    Interventions  Encouraged to attend [facility] Rehabilitation for the exercise    Continue Psychosocial Services   Follow up required by staff       Education: Education Goals: Education classes will be provided on a weekly basis, covering required topics. Participant will state understanding/return demonstration of topics presented.  Learning Barriers/Preferences: Learning Barriers/Preferences - 03/28/17 1320      Learning Barriers/Preferences   Learning  Barriers  None    Learning Preferences  None       Education Topics: Initial Evaluation Education: - Verbal, written and demonstration of respiratory meds, RPE/PD scales, oximetry and breathing techniques. Instruction on use of nebulizers and MDIs: cleaning and proper use, rinsing mouth with steroid doses and importance of monitoring MDI activations.   Pulmonary Rehab from 07/05/2017 in [facility] Cardiac and Pulmonary Rehab  Date  03/28/17  Educator  Sb  Instruction Review Code  1- Verbalizes Understanding      General Nutrition Guidelines/Fats and Fiber: -Group instruction provided by verbal, written material, models and posters to present the general guidelines for heart healthy nutrition. Gives an explanation and review of dietary fats and fiber.   Pulmonary Rehab from 07/05/2017 in [facility] Cardiac and Pulmonary Rehab  Date  05/15/17  Educator  CR  Instruction Review Code  1- Verbalizes Understanding      Controlling Sodium/Reading Food  Labels: -Group verbal and written material supporting the discussion of sodium use in heart healthy nutrition. Review and explanation with models, verbal and written materials for utilization of the food label.   Pulmonary Rehab from 07/05/2017 in [facility] Cardiac and Pulmonary Rehab  Date  05/22/17  Educator  CR  Instruction Review Code  1- Verbalizes Understanding      Exercise Physiology & Risk Factors: - Group verbal and written instruction with models to review the exercise physiology of the cardiovascular system and associated critical values. Details cardiovascular disease risk factors and the goals associated with each risk factor.   Aerobic Exercise & Resistance Training: - Gives group verbal and written discussion on the health impact of inactivity. On the components of aerobic and resistive training programs and the benefits of this training and how to safely progress through these programs.   Flexibility, Balance, General Exercise Guidelines: - Provides group verbal and written instruction on the benefits of flexibility and balance training programs. Provides general exercise guidelines with specific guidelines to those with heart or lung disease. Demonstration and skill practice provided.   Stress Management: - Provides group verbal and written instruction about the health risks of elevated stress, cause of high stress, and healthy ways to reduce stress.   Pulmonary Rehab from 07/05/2017 in [facility] Cardiac and Pulmonary Rehab  Date  04/19/17  Educator  [facility]  Instruction Review Code  1- Verbalizes Understanding      Depression: - Provides group verbal and written instruction on the correlation between heart/lung disease and depressed mood, treatment options, and the stigmas associated with seeking treatment.   Exercise & Equipment Safety: - Individual verbal instruction and demonstration of equipment use and safety with use of the equipment.   Infection Prevention: - Provides  verbal and written material to individual with discussion of infection control including proper hand washing and proper equipment cleaning during exercise session.   Pulmonary Rehab from 07/05/2017 in [facility] Cardiac and Pulmonary Rehab  Date  03/28/17  Educator  Sb  Instruction Review Code  1- Verbalizes Understanding      Falls Prevention: - Provides verbal and written material to individual with discussion of falls prevention and safety.   Pulmonary Rehab from 07/05/2017 in [facility] Cardiac and Pulmonary Rehab  Date  03/28/17  Educator  SB  Instruction Review Code  1- Verbalizes Understanding      Diabetes: - Individual verbal and written instruction to review signs/symptoms of diabetes, desired ranges of glucose level fasting, after meals and with exercise. Advice that pre and post exercise glucose  checks will be done for 3 sessions at entry of program.   Pulmonary Rehab from 07/05/2017 in [facility] Cardiac and Pulmonary Rehab  Date  03/28/17  Educator  Sb  Instruction Review Code  1- Verbalizes Understanding      Chronic Lung Diseases: - Group verbal and written instruction to review new updates, new respiratory medications, new advancements in procedures and treatments. Provide informative websites and "800" numbers of self-education.   Pulmonary Rehab from 07/05/2017 in [facility] Cardiac and Pulmonary Rehab  Date  05/03/17  Educator  [facility]  Instruction Review Code  1- Verbalizes Understanding      Lung Procedures: - Group verbal and written instruction to describe testing methods done to diagnose lung disease. Review the outcome of test results. Describe the treatment choices: Pulmonary Function Tests, ABGs and oximetry.   Energy Conservation: - Provide group verbal and written instruction for methods to conserve energy, plan and organize activities. Instruct on pacing techniques, use of adaptive equipment and posture/positioning to relieve shortness of breath.   Pulmonary Rehab from  07/05/2017 in [facility] Cardiac and Pulmonary Rehab  Date  04/12/17  Educator  [facility]  Instruction Review Code  1- Verbalizes Understanding      Triggers: - Group verbal and written instruction to review types of environmental controls: home humidity, furnaces, filters, dust mite/pet prevention, HEPA vacuums. To discuss weather changes, air quality and the benefits of nasal washing.   Pulmonary Rehab from 07/05/2017 in [facility] Cardiac and Pulmonary Rehab  Date  06/07/17  Educator  [facility]  Instruction Review Code  1- Verbalizes Understanding      Exacerbations: - Group verbal and written instruction to provide: warning signs, infection symptoms, calling MD promptly, preventive modes, and value of vaccinations. Review: effective airway clearance, coughing and/or vibration techniques. Create an Sports administrator.   Pulmonary Rehab from 07/05/2017 in [facility] Cardiac and Pulmonary Rehab  Date  06/07/17  Educator  [facility]  Instruction Review Code  1- Verbalizes Understanding      Oxygen: - Individual and group verbal and written instruction on oxygen therapy. Includes supplement oxygen, available portable oxygen systems, continuous and intermittent Bridges rates, oxygen safety, concentrators, and Medicare reimbursement for oxygen.   Respiratory Medications: - Group verbal and written instruction to review medications for lung disease. Drug class, frequency, complications, importance of spacers, rinsing mouth after steroid MDI's, and proper cleaning methods for nebulizers.   AED/CPR: - Group verbal and written instruction with the use of models to demonstrate the basic use of the AED with the basic ABC's of resuscitation.   Breathing Retraining: - Provides individuals verbal and written instruction on purpose, frequency, and proper technique of diaphragmatic breathing and pursed-lipped breathing. Applies individual practice skills.   Pulmonary Rehab from 07/05/2017 in [facility] Cardiac and Pulmonary Rehab  Date  03/28/17   Educator  Sb  Instruction Review Code  1- Verbalizes Understanding      Anatomy and Physiology of the Lungs: - Group verbal and written instruction with the use of models to provide basic lung anatomy and physiology related to function, structure and complications of lung disease.   Pulmonary Rehab from 07/05/2017 in [facility] Cardiac and Pulmonary Rehab  Date  07/05/17  Educator  [facility]  Instruction Review Code  1- Verbalizes Understanding      Anatomy & Physiology of the Heart: - Group verbal and written instruction and models provide basic cardiac anatomy and physiology, with the coronary electrical and arterial systems. Review of: AMI, Angina, Valve disease, Heart Failure, Cardiac  Arrhythmia, Pacemakers, and the ICD.   Heart Failure: - Group verbal and written instruction on the basics of heart failure: signs/symptoms, treatments, explanation of ejection fraction, enlarged heart and cardiomyopathy.   Sleep Apnea: - Individual verbal and written instruction to review Obstructive Sleep Apnea. Review of risk factors, methods for diagnosing and types of masks and machines for OSA.   Pulmonary Rehab from 07/05/2017 in [facility] Cardiac and Pulmonary Rehab  Date  06/07/17  Educator  [facility]  Instruction Review Code  1- Verbalizes Understanding      Anxiety: - Provides group, verbal and written instruction on the correlation between heart/lung disease and anxiety, treatment options, and management of anxiety.   Relaxation: - Provides group, verbal and written instruction about the benefits of relaxation for patients with heart/lung disease. Also provides patients with examples of relaxation techniques.   Cardiac Medications: - Group verbal and written instruction to review commonly prescribed medications for heart disease. Reviews the medication, class of the drug, and side effects.   Know Your Numbers: -Group verbal and written instruction about important numbers in your health.  Review of  Cholesterol, Blood Pressure, Diabetes, and BMI and the role they play in your overall health.   Other: -Provides group and verbal instruction on various topics (see comments)    Knowledge Questionnaire Score: Knowledge Questionnaire Score - 03/28/17 1320      Knowledge Questionnaire Score   Pre Score  6/10 reviewed correct response with Cody Bridges and he verbalized understanding of the response        Core Components/Risk Factors/Patient Goals at Admission: Personal Goals and Risk Factors at Admission - 03/28/17 1338      Core Components/Risk Factors/Patient Goals on Admission   Improve shortness of breath with ADL's  Yes    Intervention  Provide education, individualized exercise plan and daily activity instruction to help decrease symptoms of SOB with activities of daily living.    Expected Outcomes  Short Term: Achieves a reduction of symptoms when performing activities of daily living.  (Significant)  Long term goal: Improved ability to perform ADL's with less SOB    Develop more efficient breathing techniques such as purse lipped breathing and diaphragmatic breathing; and practicing self-pacing with activity  Yes    Intervention  Provide education, demonstration and support about specific breathing techniuqes utilized for more efficient breathing. Include techniques such as pursed lipped breathing, diaphragmatic breathing and self-pacing activity.    Expected Outcomes  Short Term: Participant will be able to demonstrate and use breathing techniques as needed throughout daily activities. Long term goal: Continued use of techniques learned to improve efficiency of breathing    Increase knowledge of respiratory medications and ability to use respiratory devices properly   Yes    Intervention  Provide education and demonstration as needed of appropriate use of medications, inhalers, and oxygen therapy.    Expected Outcomes  Short Term: Achieves understanding of medications use. Understands that  oxygen is a medication prescribed by physician. Demonstrates appropriate use of inhaler and oxygen therapy. Long Term Goal: Maintains ability to utilize medications, inhalers and oxygen therapy    Diabetes  Yes    Intervention  Provide education about signs/symptoms and action to take for hypo/hyperglycemia.;Provide education about proper nutrition, including hydration, and aerobic/resistive exercise prescription along with prescribed medications to achieve blood glucose in normal ranges: Fasting glucose 65-99 mg/dL    Expected Outcomes  Short Term: Participant verbalizes understanding of the signs/symptoms and immediate care of hyper/hypoglycemia, proper foot care and  importance of medication, aerobic/resistive exercise and nutrition plan for blood glucose control.;Long Term: Attainment of HbA1C < 7%.    Hypertension  Yes    Intervention  Provide education on lifestyle modifcations including regular physical activity/exercise, Bridges management, moderate sodium restriction and increased consumption of fresh fruit, vegetables, and low fat dairy, alcohol moderation, and smoking cessation.;Monitor prescription use compliance.    Expected Outcomes  Short Term: Continued assessment and intervention until BP is < 140/33m HG in hypertensive participants. < 130/813mHG in hypertensive participants with diabetes, heart failure or chronic kidney disease.;Long Term: Maintenance of blood pressure at goal levels.    Lipids  Yes    Intervention  Provide education and support for participant on nutrition & aerobic/resistive exercise along with prescribed medications to achieve LDL <707mHDL >109m64m  Expected Outcomes  Short Term: Participant states understanding of desired cholesterol values and is compliant with medications prescribed. Participant is following exercise prescription and nutrition guidelines.;Long Term: Cholesterol controlled with medications as prescribed, with individualized exercise RX and with  personalized nutrition plan. Value goals: LDL < 70mg26mL > 40 mg.       Core Components/Risk Factors/Patient Goals Review:  Goals and Risk Factor Review    Row Name 04/19/17 1355 05/15/17 1622 05/29/17 1638         Core Components/Risk Factors/Patient Goals Review   Personal Goals Review  Bridges Management/Obesity;Lipids;Hypertension;Diabetes  Bridges Management/Obesity;Improve shortness of breath with ADL's;Hypertension;Diabetes;Lipids;Increase knowledge of respiratory medications and ability to use respiratory devices properly.  Bridges Management/Obesity;Improve shortness of breath with ADL's;Stress;Diabetes     Review  RoberMichelsht has been steady and wants to lose Bridges. He has not met with the nutritionist. His blood pressure has been under control since the start of the program. He does not know his last lipids results. Blood sugars have been checked and within acceptable ranges. He also checks his blood sugar regularly at home.  RoberOrvales he has been felling better and breathing a little easier. He takes his albuterol and his inhalers as prescribed but has no interest in using a spacer. His blood pressure has been stable since the start of the program. His Bridges has increase slightyl due to vacations he has been going on and has scheduled.  RoberDonivand like to lose some Bridges and improve his shortness of breath. His stress is very minimal. His diabetes has been under control and checks regularly at home.     Expected Outcomes  Short: Continue to lose Bridges. Long: Maintain Bridges after Bridges loss   Short: convince Rambo to use a spacer with his inhaler. Long: Use spacer and take medications independently.  Short: attend [facility] regularly to lose Bridges. Long: maintain weightloss with an exercise routine.        Core Components/Risk Factors/Patient Goals at Discharge (Final Review):  Goals and Risk Factor Review - 05/29/17 1638      Core Components/Risk Factors/Patient Goals  Review   Personal Goals Review  Bridges Management/Obesity;Improve shortness of breath with ADL's;Stress;Diabetes    Review  RoberAlfonsad like to lose some Bridges and improve his shortness of breath. His stress is very minimal. His diabetes has been under control and checks regularly at home.    Expected Outcomes  Short: attend [facility] regularly to lose Bridges. Long: maintain weightloss with an exercise routine.       ITP Comments: ITP Comments    Row Name 03/28/17 1230 03/28/17 1403 04/24/17 0831 04/26/17 1015 05/03/17 1221   ITP  Comments  Medical review completed. INitial ITP created and sent to Dr Emily Filbert for signature and suggested changes.   Documentation of diagnosis can be found in [facility] 03/20/2017 visit  Patient met with Dr. Emily Filbert director of [place] today for face to face contact.  30 day review completed. ITP sent to Dr. Emily Filbert Director of [place]. Continue with ITP unless changes are made by physician.    Jabree was absent today and could not see Dr. Emily Filbert for face to face and chart review.  Vincenzo seen Dr. Lequita Halt who signed for Dr. Emily Filbert Director of [place] for face to face and chart review   Row Name 05/22/17 0823 06/02/17 1011 06/05/17 1039 06/19/17 0835 07/04/17 1004   ITP Comments  30 day review completed. ITP sent to Dr. Emily Filbert Director of [place]. Continue with ITP unless changes are made by physician.    Yacoub was not in [place] today to meet face to face with Dr. Sabra Heck Director of [place].  Met Dr. Sabra Heck director of [place] face to face for chart signature and review.  30 day review completed. ITP sent to Dr. Emily Filbert Director of [place]. Continue with ITP unless changes are made by physician.    Called Kyi to see how he was doing. He was on vacation and plans to return tomorrow.   [place] Name 07/05/17 1102 07/17/17 0822 07/17/17 0823       ITP Comments  Herbie [place] met face to face with Dr. Emily Filbert director of [place], chart  reviewed and signed  Unable to obtain goals due to lack off attendance  30 day review completed. ITP sent to Dr. Emily Filbert Director of [place]. Continue with ITP unless changes are made by physician.          Comments: 30 day review

## 2017-07-19 ENCOUNTER — Ambulatory Visit: Payer: Medicare Other

## 2017-07-21 ENCOUNTER — Ambulatory Visit: Payer: Medicare Other

## 2017-07-24 ENCOUNTER — Ambulatory Visit: Payer: Medicare Other

## 2017-07-26 ENCOUNTER — Ambulatory Visit: Payer: Medicare Other

## 2017-07-27 DIAGNOSIS — M79671 Pain in right foot: Secondary | ICD-10-CM | POA: Diagnosis not present

## 2017-07-27 DIAGNOSIS — L84 Corns and callosities: Secondary | ICD-10-CM | POA: Diagnosis not present

## 2017-07-27 DIAGNOSIS — M79672 Pain in left foot: Secondary | ICD-10-CM | POA: Diagnosis not present

## 2017-07-27 DIAGNOSIS — B351 Tinea unguium: Secondary | ICD-10-CM | POA: Diagnosis not present

## 2017-07-28 ENCOUNTER — Ambulatory Visit: Payer: Medicare Other

## 2017-07-31 ENCOUNTER — Ambulatory Visit: Payer: Medicare Other

## 2017-08-02 ENCOUNTER — Ambulatory Visit: Payer: Medicare Other

## 2017-08-04 ENCOUNTER — Telehealth: Payer: Self-pay

## 2017-08-04 ENCOUNTER — Ambulatory Visit: Payer: Medicare Other

## 2017-08-04 DIAGNOSIS — J449 Chronic obstructive pulmonary disease, unspecified: Secondary | ICD-10-CM

## 2017-08-04 NOTE — Telephone Encounter (Signed)
Cody Bridges is going out of town and has been busy with the holidays. He wants to resume when he gets back from his cruise and states he will try to be back August 14 2017.

## 2017-08-14 ENCOUNTER — Encounter: Payer: Medicare Other | Attending: Internal Medicine

## 2017-08-14 DIAGNOSIS — E119 Type 2 diabetes mellitus without complications: Secondary | ICD-10-CM | POA: Diagnosis not present

## 2017-08-14 DIAGNOSIS — I10 Essential (primary) hypertension: Secondary | ICD-10-CM | POA: Insufficient documentation

## 2017-08-14 DIAGNOSIS — Z87891 Personal history of nicotine dependence: Secondary | ICD-10-CM | POA: Insufficient documentation

## 2017-08-14 DIAGNOSIS — Z7984 Long term (current) use of oral hypoglycemic drugs: Secondary | ICD-10-CM | POA: Diagnosis not present

## 2017-08-14 DIAGNOSIS — Z79899 Other long term (current) drug therapy: Secondary | ICD-10-CM | POA: Diagnosis not present

## 2017-08-14 DIAGNOSIS — J449 Chronic obstructive pulmonary disease, unspecified: Secondary | ICD-10-CM

## 2017-08-14 NOTE — Progress Notes (Signed)
Daily Session Note  Patient Details  Name: Cody Bridges Morning MRN: [id number] Date of Birth: Oct 20, 1932 Referring Provider:    Encounter Date: 08/14/2017  Check In: Session Check In - 08/14/17 1018      Check-In   Location  [facility]    Staff Present  Nada Maclachlan, BA, ACSM CEP, Exercise Physiologist;Kelly Amedeo Plenty, BS, ACSM CEP, Exercise Physiologist;Deeana [place]water Flavia Shipper    Supervising physician immediately available to respond to emergencies  [facility] immediately available ER MD    Physician(s)  Dr. Clearnce Hasten and Reita Cliche    Medication changes reported      No    Fall or balance concerns reported     No    Warm-up and Cool-down  Performed as group-led instruction    Resistance Training Performed  Yes    VAD Patient?  No          Social History   Tobacco Use  Smoking Status Former Smoker  . Packs/day: 2.00  . Years: 35.00  . Pack years: 70.00  . Types: Cigarettes  . Last attempt to quit: 10/30/1985  . Years since quitting: 31.8  Smokeless Tobacco Never Used    Goals Met:  Independence with exercise equipment Exercise tolerated well No report of cardiac concerns or symptoms Strength training completed today  Goals Unmet:  Not Applicable  Comments: Pt able to follow exercise prescription today without complaint.  Will continue to monitor for progression.   Dr. Emily Filbert is Medical Director for [place] and [facility].

## 2017-08-14 NOTE — Progress Notes (Signed)
Pulmonary Individual Treatment Plan  Patient Details  Name: Tailor Westfall Stech MRN: [id number] Date of Birth: Jun 05, 1933 Referring Provider:    Initial Encounter Date:    Pulmonary Rehab from 03/28/2017 in [facility]  Date  03/28/17      Visit Diagnosis: Chronic obstructive pulmonary disease, unspecified COPD type ([place])  Patient's Home Medications on Admission:  Current Outpatient Medications:  .  ACCU-CHEK AVIVA test strip, TEST EVERY DAY, Disp: , Rfl: 5 .  ACCU-CHEK SOFTCLIX LANCETS lancets, daily., Disp: , Rfl: 5 .  calcium carbonate (TUMS - DOSED IN MG ELEMENTAL CALCIUM) 500 MG chewable tablet, Chew 1-2 tablets by mouth 3 (three) times daily as needed (for acid reflux/indigestion.)., Disp: , Rfl:  .  Cholecalciferol (VITAMIN D-3) 5000 units TABS, Take 5,000 Units by mouth daily., Disp: , Rfl:  .  Fluticasone-Umeclidin-Vilant (TRELEGY ELLIPTA) 100-62.5-25 MCG/INH AEPB, Inhale 1 puff into the lungs daily., Disp: 60 each, Rfl: 5 .  hydrochlorothiazide (HYDRODIURIL) 25 MG tablet, Take 25 mg by mouth daily., Disp: , Rfl:  .  lisinopril (PRINIVIL,ZESTRIL) 5 MG tablet, Take 5 mg by mouth daily., Disp: , Rfl:  .  metFORMIN (GLUCOPHAGE) 500 MG tablet, Take 500 mg by mouth 2 (two) times daily., Disp: , Rfl:  .  Multiple Vitamin (MULTIVITAMIN WITH MINERALS) TABS tablet, Take 1 tablet by mouth daily., Disp: , Rfl:  .  PROAIR HFA 108 (90 Base) MCG/ACT inhaler, Inhale 1-2 puffs into the lungs every 6 (six) hours as needed (for wheezing/shortness of breath). , Disp: , Rfl:  .  rosuvastatin (CRESTOR) 20 MG tablet, Take 20 mg by mouth daily., Disp: , Rfl:  .  triamcinolone ointment (KENALOG) 0.1 %, Apply 1 application topically 2 (two) times daily as needed (for dry skin/irritation.)., Disp: , Rfl:   Past Medical History: Past Medical History:  Diagnosis Date  . Arthritis   . Asthma   . Diabetes mellitus without complication ([place])     2952  . Dyspnea    with exertion  .  GERD (gastroesophageal reflux disease)   . Hyperlipidemia   . Hypertension     Tobacco Use: Social History   Tobacco Use  Smoking Status Former Smoker  . Packs/day: 2.00  . Years: 35.00  . Pack years: 70.00  . Types: Cigarettes  . Last attempt to quit: 10/30/1985  . Years since quitting: 31.8  Smokeless Tobacco Never Used    Labs: Recent Review Flowsheet Data    Labs for ITP Cardiac and Pulmonary Rehab 10/24/2016 01/26/2017 05/01/2017   Hemoglobin A1c 6.8(H) 6.5 6.3       Pulmonary Assessment Scores: Pulmonary Assessment Scores    Row Name 03/28/17 1330 07/05/17 1052       ADL UCSD   ADL Phase  Entry  Mid    SOB Score total  76  49    Rest  2  1    Walk  3  3    Stairs  4  4    Bath  2  1    Dress  2  4    Shop  3  4      CAT Score   CAT Score  25  -      mMRC Score   mMRC Score  1  -       Pulmonary Function Assessment: Pulmonary Function Assessment - 03/28/17 1348      Pulmonary Function Tests   FVC%  78 % Test performed 12/15/2016  FEV1%  59 %    FEV1/FVC Ratio  59      Breath   Shortness of Breath  Yes;Fear of Shortness of Breath;Limiting activity       Exercise Target Goals:    Exercise Program Goal: Individual exercise prescription set with THRR, safety & activity barriers. Participant demonstrates ability to understand and report RPE using BORG scale, to self-measure pulse accurately, and to acknowledge the importance of the exercise prescription.  Exercise Prescription Goal: Starting with aerobic activity 30 plus minutes a day, 3 days per week for initial exercise prescription. Provide home exercise prescription and guidelines that participant acknowledges understanding prior to discharge.  Activity Barriers & Risk Stratification: Activity Barriers & Cardiac Risk Stratification - 03/28/17 1236      Activity Barriers & Cardiac Risk Stratification   Activity Barriers  Arthritis;Back Problems;Shortness of Breath;Muscular Weakness Low back  pain, arthritis hands and other joints, legs weak, SOB from chronic lung disease       6 Minute Walk: 6 Minute Walk    Row Name 03/28/17 1332         6 Minute Walk   Distance  950 feet     Walk Time  6 minutes     # of Rest Breaks  0     MPH  1.79     METS  1.86     RPE  15     Perceived Dyspnea   2     VO2 Peak  6.5     Symptoms  Yes (comment)     Comments  back pain 7/10     Resting HR  95 bpm     Resting BP  122/68     Resting Oxygen Saturation   96 %     Exercise Oxygen Saturation  during 6 min walk  95 %     Max Ex. HR  132 bpm     Max Ex. BP  132/56     2 Minute Post BP  116/70       Interval HR   1 Minute HR  114     2 Minute HR  120     3 Minute HR  132     4 Minute HR  124     5 Minute HR  121     6 Minute HR  123     2 Minute Post HR  85     Interval Heart Rate?  Yes       Interval Oxygen   Interval Oxygen?  Yes     Baseline Oxygen Saturation %  96 %     1 Minute Oxygen Saturation %  95 %     1 Minute Liters of Oxygen  0 L     2 Minute Oxygen Saturation %  95 %     2 Minute Liters of Oxygen  0 L     3 Minute Oxygen Saturation %  95 %     3 Minute Liters of Oxygen  0 L     4 Minute Oxygen Saturation %  95 %     4 Minute Liters of Oxygen  0 L     5 Minute Oxygen Saturation %  96 %     5 Minute Liters of Oxygen  0 L     6 Minute Oxygen Saturation %  96 %     6 Minute Liters of Oxygen  0 L     2 Minute  Post Oxygen Saturation %  97 %     2 Minute Post Liters of Oxygen  0 L       Oxygen Initial Assessment: Oxygen Initial Assessment - 03/28/17 1231      Home Oxygen   Home Oxygen Device  None    Sleep Oxygen Prescription  None    Home Exercise Oxygen Prescription  None    Home at Rest Exercise Oxygen Prescription  None      Initial 6 min Walk   Oxygen Used  None      Program Oxygen Prescription   Program Oxygen Prescription  None      Intervention   Short Term Goals  To learn and understand importance of monitoring SPO2 with pulse oximeter  and demonstrate accurate use of the pulse oximeter.;To learn and demonstrate proper pursed lip breathing techniques or other breathing techniques.;To learn and understand importance of maintaining oxygen saturations>88%;To learn and demonstrate proper use of respiratory medications    Long  Term Goals  Verbalizes importance of monitoring SPO2 with pulse oximeter and return demonstration;Exhibits proper breathing techniques, such as pursed lip breathing or other method taught during program session;Demonstrates proper use of MDI's;Compliance with respiratory medication;Maintenance of O2 saturations>88%       Oxygen Re-Evaluation: Oxygen Re-Evaluation    Row Name 04/10/17 1040 04/19/17 1349 05/15/17 1626 05/29/17 1604 07/05/17 1102     Program Oxygen Prescription   Program Oxygen Prescription  -  None  None  None  None     Home Oxygen   Home Oxygen Device  None  None  None  None  None   Sleep Oxygen Prescription  None  None  None  None  None   Home Exercise Oxygen Prescription  None  None  None  None  None   Home at Rest Exercise Oxygen Prescription  None  None  None  None  None     Goals/Expected Outcomes   Short Term Goals  To learn and understand importance of monitoring SPO2 with pulse oximeter and demonstrate accurate use of the pulse oximeter.;To learn and demonstrate proper pursed lip breathing techniques or other breathing techniques.;To learn and understand importance of maintaining oxygen saturations>88%;To learn and demonstrate proper use of respiratory medications  To learn and understand importance of maintaining oxygen saturations>88%;To learn and demonstrate proper use of respiratory medications;To learn and demonstrate proper pursed lip breathing techniques or other breathing techniques.;To learn and understand importance of monitoring SPO2 with pulse oximeter and demonstrate accurate use of the pulse oximeter.  To learn and understand importance of maintaining oxygen  saturations>88%;To learn and demonstrate proper pursed lip breathing techniques or other breathing techniques.;To learn and understand importance of monitoring SPO2 with pulse oximeter and demonstrate accurate use of the pulse oximeter.;To learn and demonstrate proper use of respiratory medications  To learn and understand importance of maintaining oxygen saturations>88%;To learn and demonstrate proper pursed lip breathing techniques or other breathing techniques.;To learn and understand importance of monitoring SPO2 with pulse oximeter and demonstrate accurate use of the pulse oximeter.;To learn and demonstrate proper use of respiratory medications  To learn and understand importance of maintaining oxygen saturations>88%;To learn and demonstrate proper pursed lip breathing techniques or other breathing techniques.;To learn and understand importance of monitoring SPO2 with pulse oximeter and demonstrate accurate use of the pulse oximeter.;To learn and demonstrate proper use of respiratory medications   Long  Term Goals  Maintenance of O2 saturations>88%;Compliance with respiratory medication;Verbalizes importance of monitoring SPO2 with pulse  oximeter and return demonstration;Exhibits proper breathing techniques, such as pursed lip breathing or other method taught during program session;Demonstrates proper use of MDI's  Verbalizes importance of monitoring SPO2 with pulse oximeter and return demonstration;Exhibits proper breathing techniques, such as pursed lip breathing or other method taught during program session;Demonstrates proper use of MDI's;Compliance with respiratory medication;Maintenance of O2 saturations>88%  Maintenance of O2 saturations>88%;Compliance with respiratory medication;Demonstrates proper use of MDI's;Exhibits proper breathing techniques, such as pursed lip breathing or other method taught during program session;Verbalizes importance of monitoring SPO2 with pulse oximeter and return  demonstration  Maintenance of O2 saturations>88%;Compliance with respiratory medication;Demonstrates proper use of MDI's;Exhibits proper breathing techniques, such as pursed lip breathing or other method taught during program session;Verbalizes importance of monitoring SPO2 with pulse oximeter and return demonstration  Maintenance of O2 saturations>88%;Compliance with respiratory medication;Demonstrates proper use of MDI's;Exhibits proper breathing techniques, such as pursed lip breathing or other method taught during program session;Verbalizes importance of monitoring SPO2 with pulse oximeter and return demonstration   Comments  Reviewed PLB technique with pt.  Talked about how it work and it's important to maintaining his exercise saturations.    Informed Darrly about the effects of COPD, He does not have a pulse oximeter at home to check his oxygen. Informed him how to obtain one and to make sure his oxygen is above 88 percent. He needs alot of imrpovement on PLB techniques.  Kamali states that he checks his blood  pressure and his heart rate at home regularly. He does not show an interest in purchasing a pulse oximeter to check his oxygen saturation. He does not want to use a spacer for his inhalers either. His oxygen levels have been above 88 percent while exercising.  His PLB has got better but still could use practice.  Tupac states that he has been a little more short of breath this past week. He states he gets sore from exercising and usually wont attend until he is not sore. Informed him that he should come more frequently to help with his soreness and his shortness of breath.  Keats has been out on vacation and has returned to [employer] today. He has been taking his medications as prescribed. Informed Manning to try to attend [facility] more frequently.   Goals/Expected Outcomes  Short: Become more profiecient at using PLB.   Long: Become independent at using PLB.  Short:obtatin a pulse oximeter and  work on PLB. Long: Check and maintain an oxygen saturation greater than 88 precent. Be proficient with PLB  Short: convince [place] to use a spacer with his medications. Long: Independently use a spacer with his medications  Short: attened [facility] regulary. Long: maintain an exercise regimine to help with shortness of breath.  Short: attened [facility] regulary. Long: maintain an exercise regimine to help with shortness of breath.      Oxygen Discharge (Final Oxygen Re-Evaluation): Oxygen Re-Evaluation - 07/05/17 1102      Program Oxygen Prescription   Program Oxygen Prescription  None      Home Oxygen   Home Oxygen Device  None    Sleep Oxygen Prescription  None    Home Exercise Oxygen Prescription  None    Home at Rest Exercise Oxygen Prescription  None      Goals/Expected Outcomes   Short Term Goals  To learn and understand importance of maintaining oxygen saturations>88%;To learn and demonstrate proper pursed lip breathing techniques or other breathing techniques.;To learn and understand importance of monitoring SPO2 with pulse oximeter  and demonstrate accurate use of the pulse oximeter.;To learn and demonstrate proper use of respiratory medications    Long  Term Goals  Maintenance of O2 saturations>88%;Compliance with respiratory medication;Demonstrates proper use of MDI's;Exhibits proper breathing techniques, such as pursed lip breathing or other method taught during program session;Verbalizes importance of monitoring SPO2 with pulse oximeter and return demonstration    Comments  Rumi has been out on vacation and has returned to [place] today. He has been taking his medications as prescribed. Informed Yer to try to attend [facility] more frequently.    Goals/Expected Outcomes  Short: attened [facility] regulary. Long: maintain an exercise regimine to help with shortness of breath.       Initial Exercise Prescription: Initial Exercise Prescription - 03/28/17 1300      Date of  Initial Exercise RX and Referring Provider   Date  03/28/17      Treadmill   MPH  1.5    Grade  0    Minutes  15    METs  2.15      Recumbant Bike   Level  1    RPM  60    Watts  5    Minutes  15    METs  1.8      NuStep   Level  2    SPM  80    Minutes  15    METs  2      Arm Ergometer   Level  1    RPM  50    Minutes  15    METs  1.6      Prescription Details   Frequency (times per week)  3    Duration  Progress to 45 minutes of aerobic exercise without signs/symptoms of physical distress      Intensity   THRR 40-80% of Max Heartrate  112-129    Ratings of Perceived Exertion  11-15    Perceived Dyspnea  0-4      Progression   Progression  Continue to progress workloads to maintain intensity without signs/symptoms of physical distress.      Resistance Training   Training Prescription  Yes    Weight  3    Reps  10-15       Perform Capillary Blood Glucose checks as needed.  Exercise Prescription Changes: Exercise Prescription Changes    Row Name 03/28/17 1300 04/12/17 1500 04/26/17 1500 05/03/17 1400 05/10/17 1000     Response to Exercise   Blood Pressure (Admit)  122/68  132/64  -  -  124/64   Blood Pressure (Exercise)  132/56  -  -  -  -   Blood Pressure (Exit)  116/70  128/68  132/84  -  -   Heart Rate (Admit)  95 bpm  95 bpm  -  -  98 bpm   Heart Rate (Exercise)  132 bpm  96 bpm  121 bpm  -  123 bpm   Heart Rate (Exit)  85 bpm  56 bpm  99 bpm  -  -   Oxygen Saturation (Admit)  96 %  95 %  97 %  -  94 %   Oxygen Saturation (Exercise)  95 %  95 %  95 %  -  95 %   Oxygen Saturation (Exit)  97 %  97 %  95 %  -  -   Rating of Perceived Exertion (Exercise)  _0 -  14  Perceived Dyspnea (Exercise)  _0 -  3   Symptoms  back pain 7/10  none  none  -  none   Duration  Progress to 45 minutes of aerobic exercise without signs/symptoms of physical distress  Progress to 45 minutes of aerobic exercise without signs/symptoms of physical distress   Progress to 45 minutes of aerobic exercise without signs/symptoms of physical distress  -  Continue with 45 min of aerobic exercise without signs/symptoms of physical distress.   Intensity  -  -  -  -  THRR unchanged     Progression   Progression  -  Continue to progress workloads to maintain intensity without signs/symptoms of physical distress.  Continue to progress workloads to maintain intensity without signs/symptoms of physical distress.  Continue to progress workloads to maintain intensity without signs/symptoms of physical distress.  Continue to progress workloads to maintain intensity without signs/symptoms of physical distress.   Average METs  -  1._1 Resistance Training   Training Prescription  -  Yes  Yes  Yes  Yes   Weight  -  _2 Reps  -  10-15  10-15  10-15  10-15     Interval Training   Interval Training  -  No  No  No  No     Treadmill   MPH  -  -  1.5  1.5  1.5   Grade  -  -  0  0  0   Minutes  -  -  _3 METs  -  -  2.15  2.15  2.15     NuStep   Level  -  _4 SPM  -  72  72  72  69   Minutes  -  _5 METs  -  _6 Arm Ergometer   Level  -  _7 1.2   RPM  -  43  41  41  -   Minutes  -  _8 METs  -  1._9 -     Home Exercise Plan   Plans to continue exercise at  -  -  -  Home (comment) at his appartment complex  -   Frequency  -  -  -  Add 1 additional day to program exercise sessions.  -   Initial Home Exercises Provided  -  -  -  05/03/17  -   [place] Name 05/24/17 1400 06/07/17 1200 06/07/17 1300 06/21/17 1000 07/05/17 1200     Response to Exercise   Blood Pressure (Admit)  126/62  134/64  -  132/70  114/60   Blood Pressure (Exit)  124/76  130/68  -  118/70  108/60   Heart Rate (Admit)  93 bpm  103 bpm  -  -  91 bpm   Heart Rate (Exercise)  104 bpm  126 bpm  -  123 bpm  112 bpm   Heart Rate (Exit)  81 bpm  -  -  78 bpm  93 bpm   Oxygen Saturation (Admit)  99 %  97 %  -  -  95 %   Oxygen Saturation (Exercise)  97 %  96 %  -  96 %  96 %   Oxygen Saturation (Exit)  95 %  -  -  94 %  96 %   Rating of Perceived Exertion (Exercise)  13  12  -  13  13   Perceived Dyspnea (Exercise)  2  2  -  3  3   Symptoms  none  none  -  none  none   Duration  Continue with 45 min of aerobic exercise without signs/symptoms of physical distress.  Continue with 45 min of aerobic exercise without signs/symptoms of physical distress.  -  Continue with 45 min of aerobic exercise without signs/symptoms of physical distress.  Continue with 45 min of aerobic exercise without signs/symptoms of physical distress.   Intensity  THRR unchanged  THRR unchanged  -  THRR unchanged  THRR unchanged     Progression   Progression  Continue to progress workloads to maintain intensity without signs/symptoms of physical distress.  Continue to progress workloads to maintain intensity without signs/symptoms of physical distress.  -  Continue to progress workloads to maintain intensity without signs/symptoms of physical distress.  Continue to progress workloads to maintain intensity without signs/symptoms of physical distress.   Average METs  2.2  2.6  -  2.5  2.2     Resistance Training   Training Prescription  Yes  Yes  -  Yes  Yes   Weight  3  3 lb  -  4 lb  4 lb   Reps  10-15  10-15  -  10-15  10-15     Interval Training   Interval Training  No  No  -  -  -     Treadmill   MPH  1.5  1.5  -  1.8  1.7   Grade  0  0  -  0  0   Minutes  15  15  -  15  15   METs  2.15  2.15  -  2.38  2.3     NuStep   Level  2  4  -  4  -   SPM  79  95  -  98  -   Minutes  15  15  -  15  -   METs  2  3.1  -  3  -     Arm Ergometer   Level  -  -  -  2  2   Minutes  -  -  -  15  15   METs  -  -  -  2.2  2.1     Home Exercise Plan   Plans to continue exercise at  -  -  Home (comment) at his appartment complex  Home (comment) at his appartment complex  -   Frequency  -  -  Add 1 additional day to program exercise  sessions.  Add 1 additional day to program exercise sessions.  -   Initial Home Exercises Provided  -  -  05/03/17  05/03/17  -      Exercise Comments: Exercise Comments    Row Name 04/10/17 1046 05/03/17 1453         Exercise Comments  First full day of exercise!  Patient was oriented to gym and equipment including functions, settings, policies, and procedures.  Patient's individual exercise prescription and treatment plan were reviewed.  All  starting workloads were established based on the results of the 6 minute walk test done at initial orientation visit.  The plan for exercise progression was also introduced and progression will be customized based on patient's performance and goals.  Reviewed home exercise with pt today.  Pt plans to use the fitness center at his appartment for exercise.  Reviewed THR, pulse, RPE, sign and symptoms, NTG use, and when to call 911 or MD.  Also discussed weather considerations and indoor options.  Pt voiced understanding.         Exercise Goals and Review: Exercise Goals    Row Name 03/28/17 1336             Exercise Goals   Increase Physical Activity  Yes       Intervention  Provide advice, education, support and counseling about physical activity/exercise needs.;Develop an individualized exercise prescription for aerobic and resistive training based on initial evaluation findings, risk stratification, comorbidities and participant's personal goals.       Expected Outcomes  Achievement of increased cardiorespiratory fitness and enhanced flexibility, muscular endurance and strength shown through measurements of functional capacity and personal statement of participant.       Increase Strength and Stamina  Yes       Intervention  Provide advice, education, support and counseling about physical activity/exercise needs.;Develop an individualized exercise prescription for aerobic and resistive training based on initial evaluation findings, risk  stratification, comorbidities and participant's personal goals.       Expected Outcomes  Achievement of increased cardiorespiratory fitness and enhanced flexibility, muscular endurance and strength shown through measurements of functional capacity and personal statement of participant.       Able to understand and use rate of perceived exertion (RPE) scale  Yes       Intervention  Provide education and explanation on how to use RPE scale       Expected Outcomes  Short Term: Able to use RPE daily in rehab to express subjective intensity level;Long Term:  Able to use RPE to guide intensity level when exercising independently       Able to understand and use Dyspnea scale  Yes       Intervention  Provide education and explanation on how to use Dyspnea scale       Expected Outcomes  Short Term: Able to use Dyspnea scale daily in rehab to express subjective sense of shortness of breath during exertion;Long Term: Able to use Dyspnea scale to guide intensity level when exercising independently       Knowledge and understanding of Target Heart Rate Range (THRR)  Yes       Intervention  Provide education and explanation of THRR including how the numbers were predicted and where they are located for reference       Expected Outcomes  Short Term: Able to state/look up THRR;Long Term: Able to use THRR to govern intensity when exercising independently;Short Term: Able to use daily as guideline for intensity in rehab       Able to check pulse independently  Yes       Intervention  Provide education and demonstration on how to check pulse in carotid and radial arteries.;Review the importance of being able to check your own pulse for safety during independent exercise       Expected Outcomes  Short Term: Able to explain why pulse checking is important during independent exercise;Long Term: Able to check pulse independently and accurately  Understanding of Exercise Prescription  Yes       Intervention  Provide  education, explanation, and written materials on patient's individual exercise prescription       Expected Outcomes  Short Term: Able to explain program exercise prescription;Long Term: Able to explain home exercise prescription to exercise independently          Exercise Goals Re-Evaluation : Exercise Goals Re-Evaluation    Row Name 04/12/17 1504 04/26/17 1544 05/03/17 1454 05/10/17 1058 05/24/17 1454     Exercise Goal Re-Evaluation   Exercise Goals Review  Increase Physical Activity;Increase Strength and Stamina  Increase Physical Activity;Increase Strength and Stamina  Increase Physical Activity;Increase Strength and Stamina;Able to understand and use rate of perceived exertion (RPE) scale;Able to check pulse independently;Knowledge and understanding of Target Heart Rate Range (THRR);Understanding of Exercise Prescription;Able to understand and use Dyspnea scale  Increase Physical Activity;Increase Strength and Stamina;Understanding of Exercise Prescription  Increase Physical Activity;Increase Strength and Stamina   Comments  Mr Stay has tolerated exercise well.  He had some muscle soreness after his first session.  Mikki Santee has tolerated exercise well.  He needs to attend 2-3 days per week to improve.  Staff will remind him to attend regularly.  Reviewed home exercise with pt today.  Pt plans to use the fitness center at his appartment for exercise.  Reviewed THR, pulse, RPE, sign and symptoms, NTG use, and when to call 911 or MD.  Also discussed weather considerations and indoor options.  Pt voiced understanding.  Mikki Santee has sporadic attendance at class.  He would see better results with regular attendance 2-3 times per week.  Staff will remind Mikki Santee of the benefits of regular exercise.  Mikki Santee has been out of town.  he would benefit from more frequent attendance.  Staff reviewed the benefits of exercising 3 times per week.   Expected Outcomes  Short - Mr Greenawalt will continue to attend regularly.  Long - Mr Sarracino  will progress with exercise.  Short - Mikki Santee will attend 2-3 days per week.  Long - Mikki Santee will see imporvement in overall MET and fitness level.  Short: add 1 day of exercise to his daily routine. Long: Increase number of days for exercise.  Short - Mikki Santee will attend at leat 2 times per week.  Long - Mikki Santee will progress more with regular attendance.  Short - Mikki Santee will attend at least 2 x per week. Long - Mikki Santee will progress more with exercise.   [place] Name 06/07/17 1302 06/21/17 1039 07/05/17 1237         Exercise Goal Re-Evaluation   Exercise Goals Review  Increase Physical Activity;Increase Strength and Stamina  Increase Physical Activity;Increase Strength and Stamina;Able to understand and use rate of perceived exertion (RPE) scale;Able to understand and use Dyspnea scale  Increase Physical Activity;Increase Strength and Stamina     Comments  Mikki Santee is tolerating exercise well.  Staff will continue to monitor.  Mikki Santee is tolerating exercise well.  He would see better progresion with regular attendance.  He has been travelling frequently.  Today was pt first day back after a vacation of 3 weeks.  He reduced speed on TM for today.       Expected Outcomes  Short - Mikki Santee will exercise regularly even when travelling.  Long - Mikki Santee will maintain exercise on his own.  Short - Mikki Santee will attend LW at least twice per week.  Long - Mikki Santee will exercise regularly.  Short - Mikki Santee will build to former  levels Long - Mikki Santee will continue to progress MET level        Discharge Exercise Prescription (Final Exercise Prescription Changes): Exercise Prescription Changes - 07/05/17 1200      Response to Exercise   Blood Pressure (Admit)  114/60    Blood Pressure (Exit)  108/60    Heart Rate (Admit)  91 bpm    Heart Rate (Exercise)  112 bpm    Heart Rate (Exit)  93 bpm    Oxygen Saturation (Admit)  95 %    Oxygen Saturation (Exercise)  96 %    Oxygen Saturation (Exit)  96 %    Rating of Perceived Exertion (Exercise)  13    Perceived Dyspnea  (Exercise)  3    Symptoms  none    Duration  Continue with 45 min of aerobic exercise without signs/symptoms of physical distress.    Intensity  THRR unchanged      Progression   Progression  Continue to progress workloads to maintain intensity without signs/symptoms of physical distress.    Average METs  2.2      Resistance Training   Training Prescription  Yes    Weight  4 lb    Reps  10-15      Treadmill   MPH  1.7    Grade  0    Minutes  15    METs  2.3      Arm Ergometer   Level  2    Minutes  15    METs  2.1       Nutrition:  Target Goals: Understanding of nutrition guidelines, daily intake of sodium <157m, cholesterol <2016m calories 30% from fat and 7% or less from saturated fats, daily to have 5 or more servings of fruits and vegetables.  Biometrics: Pre Biometrics - 03/28/17 1330      Pre Biometrics   Height  5' 10.5" (1.791 m)    Weight  211 lb 4.8 oz (95.8 kg)    Waist Circumference  46 inches    Hip Circumference  44.5 inches    Waist to Hip Ratio  1.03 %    BMI (Calculated)  29.88        Nutrition Therapy Plan and Nutrition Goals: Nutrition Therapy & Goals - 05/29/17 1608      Nutrition Therapy   RD appointment defered  Yes      Personal Nutrition Goals   Comments  patient declines to see the dietician       Nutrition Discharge: Rate Your Plate Scores:   Nutrition Goals Re-Evaluation: Nutrition Goals Re-Evaluation    RoHuntleyame 04/19/17 1403 05/15/17 1631 05/29/17 1608         Goals   Current Weight  215 lb (97.5 kg)  217 lb (98.4 kg)  215 lb (97.5 kg)     Nutrition Goal  Patient declined to see the nutritionist  Lose weight and increase strength and stamina.  Eat healthier and lose weight     Comment  Roberts weight will continued to be monitored and inform him that he can meet with a dietician is he wants to.  RoMaikelas not lost weight but has gained a few pounds due to [place] does not want to meet with the dietician.   RoParmviras lost 2 pounds since his last weigh in. He has been vacationing and is starting to not eat as much. He states that he is pickey with what he ears at times.  Expected Outcome  Short: lose weight using exercise. Long: Maintain weightloss with exercise at home.  Short: Lose a few pounds before his next vacation. Long; maintain a nutrition and weightloss program.  Short: eat a heart healthy diet. Long: Adhere to a diet plan.        Nutrition Goals Discharge (Final Nutrition Goals Re-Evaluation): Nutrition Goals Re-Evaluation - 05/29/17 1608      Goals   Current Weight  215 lb (97.5 kg)    Nutrition Goal  Eat healthier and lose weight    Comment  Finbar has lost 2 pounds since his last weigh in. He has been vacationing and is starting to not eat as much. He states that he is pickey with what he ears at times.    Expected Outcome  Short: eat a heart healthy diet. Long: Adhere to a diet plan.       Psychosocial: Target Goals: Acknowledge presence or absence of significant depression and/or stress, maximize coping skills, provide positive support system. Participant is able to verbalize types and ability to use techniques and skills needed for reducing stress and depression.   Initial Review & Psychosocial Screening: Initial Psych Review & Screening - 03/28/17 1239      Initial Review   Current issues with  None Identified HAd depression when told he possible had cancer.  CAncer was ruled out with a biospy and depression is gone.       Family Dynamics   Good Support System?  Yes Mateo Flow, his significant other      Barriers   Psychosocial barriers to participate in program  There are no identifiable barriers or psychosocial needs.;The patient should benefit from training in stress management and relaxation.      Screening Interventions   Interventions  Encouraged to exercise;Provide feedback about the scores to participant;To provide support and resources with identified  psychosocial needs       Quality of Life Scores:   PHQ-9: Recent Review Flowsheet Data    Depression screen [facility] 2/9 07/05/2017 03/28/2017 01/26/2017   Decreased Interest 2 0 0   Down, Depressed, Hopeless 0 0 0   PHQ - 2 Score 2 0 0   Altered sleeping 0 0 -   Tired, decreased energy 2 2 -   Change in appetite 1 2 -   Feeling bad or failure about yourself  0 0 -   Trouble concentrating 2 0 -   Moving slowly or fidgety/restless 0 0 -   Suicidal thoughts 0 0 -   PHQ-9 Score 7 4 -   Difficult doing work/chores Not difficult at all Somewhat difficult -     Interpretation of Total Score  Total Score Depression Severity:  1-4 = Minimal depression, 5-9 = Mild depression, 10-14 = Moderate depression, 15-19 = Moderately severe depression, 20-27 = Severe depression   Psychosocial Evaluation and Intervention: Psychosocial Evaluation - 04/10/17 1100      Psychosocial Evaluation & Interventions   Interventions  Encouraged to exercise with the program and follow exercise prescription;Stress management education    Comments  Counselor met with Mr. Segreto Mikki Santee) today for initial psychosocial evaluation.  He is an 84 year old who loves to travel.  He has some health issues with Asthma and Emphysema that impact that somewhat.  Mikki Santee has a strong support system with a significant other of (6) years; and daughter who lives close by; a sister-in-law locally; and active involvement in his local church; [employer] and a 87 year [employer].  Mikki Santee reports sleeping well and having a good appetite.  He denies a history of depression or anxiety or any current symptoms.  Mikki Santee states he is typically in a positive mood and other than his health issues - he has minimal stress in his life.  Mikki Santee has goals to improve his breathing and learn better ways to manage his illness while in this program.  He will be followed by staff.      Expected Outcomes  Mikki Santee will benefit from consistent exercise to achieve his stated goals.  The  educational and psychoeducational components of this program will help Mikki Santee learn to manage and cope with his illnesses better.         Psychosocial Re-Evaluation: Psychosocial Re-Evaluation    [place] Name 04/19/17 1409 05/15/17 1635 05/29/17 1626         Psychosocial Re-Evaluation   Current issues with  None Identified;Current Stress Concerns  None Identified;Current Stress Concerns  Current Stress Concerns     Comments  Eligha states that his home life is great. He wants to be able to breath better and feels like the program is helping him to do so. He said he eats, sleeps, watches TV and says his relationship is great. He is traveling to [place] next month and the [place] in November.  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 He states his relationship with his girlfriend is going very well and they have been togehter for 6 years. His shortness of breath and his chronic illness seems to be his only stressor.  Veniamin has felt more short of breath this past week. He states his home life is great. He and his girlfriend do everything together. He states that his daughter likes his girlfriend and lights up when he talked about it.     Expected Outcomes  Short: exercise to improve shortness breath to minimize stress. Long: Maintain a stress free environment at home and with his breathing.  Short: attend [facility] regularly to improve stress. Long: Maintain a workout regimine to minimize stress   Short: continue rehab. Long: continue with a positive outlook post [facility].     Interventions  Encouraged to attend [facility] for the exercise;Stress management education  Encouraged to attend [facility] for the exercise  Encouraged to attend [facility] for the exercise     Continue Psychosocial Services   Follow up required by staff  Follow up required by staff  Follow up required by staff        Psychosocial Discharge  (Final Psychosocial Re-Evaluation): Psychosocial Re-Evaluation - 05/29/17 1626      Psychosocial Re-Evaluation   Current issues with  Current Stress Concerns    Comments  Dirk has felt more short of breath this past week. He states his home life is great. He and his girlfriend do everything together. He states that his daughter likes his girlfriend and lights up when he talked about it.    Expected Outcomes  Short: continue rehab. Long: continue with a positive outlook post [facility].    Interventions  Encouraged to attend [facility] for the exercise    Continue Psychosocial Services   Follow up required by staff       Education: Education Goals: Education classes will be provided on a weekly basis, covering required topics. Participant will state understanding/return demonstration of topics presented.  Learning Barriers/Preferences: Learning Barriers/Preferences - 03/28/17 1320      Learning Barriers/Preferences   Learning  Barriers  None    Learning Preferences  None       Education Topics: Initial Evaluation Education: - Verbal, written and demonstration of respiratory meds, RPE/PD scales, oximetry and breathing techniques. Instruction on use of nebulizers and MDIs: cleaning and proper use, rinsing mouth with steroid doses and importance of monitoring MDI activations.   Pulmonary Rehab from 07/05/2017 in [facility] Cardiac and Pulmonary Rehab  Date  03/28/17  Educator  Sb  Instruction Review Code  1- Verbalizes Understanding      General Nutrition Guidelines/Fats and Fiber: -Group instruction provided by verbal, written material, models and posters to present the general guidelines for heart healthy nutrition. Gives an explanation and review of dietary fats and fiber.   Pulmonary Rehab from 07/05/2017 in [facility] Cardiac and Pulmonary Rehab  Date  05/15/17  Educator  CR  Instruction Review Code  1- Verbalizes Understanding      Controlling Sodium/Reading Food  Labels: -Group verbal and written material supporting the discussion of sodium use in heart healthy nutrition. Review and explanation with models, verbal and written materials for utilization of the food label.   Pulmonary Rehab from 07/05/2017 in [facility] Cardiac and Pulmonary Rehab  Date  05/22/17  Educator  CR  Instruction Review Code  1- Verbalizes Understanding      Exercise Physiology & Risk Factors: - Group verbal and written instruction with models to review the exercise physiology of the cardiovascular system and associated critical values. Details cardiovascular disease risk factors and the goals associated with each risk factor.   Aerobic Exercise & Resistance Training: - Gives group verbal and written discussion on the health impact of inactivity. On the components of aerobic and resistive training programs and the benefits of this training and how to safely progress through these programs.   Flexibility, Balance, General Exercise Guidelines: - Provides group verbal and written instruction on the benefits of flexibility and balance training programs. Provides general exercise guidelines with specific guidelines to those with heart or lung disease. Demonstration and skill practice provided.   Stress Management: - Provides group verbal and written instruction about the health risks of elevated stress, cause of high stress, and healthy ways to reduce stress.   Pulmonary Rehab from 07/05/2017 in [facility] Cardiac and Pulmonary Rehab  Date  04/19/17  Educator  [facility]  Instruction Review Code  1- Verbalizes Understanding      Depression: - Provides group verbal and written instruction on the correlation between heart/lung disease and depressed mood, treatment options, and the stigmas associated with seeking treatment.   Exercise & Equipment Safety: - Individual verbal instruction and demonstration of equipment use and safety with use of the equipment.   Infection Prevention: - Provides  verbal and written material to individual with discussion of infection control including proper hand washing and proper equipment cleaning during exercise session.   Pulmonary Rehab from 07/05/2017 in [facility] Cardiac and Pulmonary Rehab  Date  03/28/17  Educator  Sb  Instruction Review Code  1- Verbalizes Understanding      Falls Prevention: - Provides verbal and written material to individual with discussion of falls prevention and safety.   Pulmonary Rehab from 07/05/2017 in [facility] Cardiac and Pulmonary Rehab  Date  03/28/17  Educator  SB  Instruction Review Code  1- Verbalizes Understanding      Diabetes: - Individual verbal and written instruction to review signs/symptoms of diabetes, desired ranges of glucose level fasting, after meals and with exercise. Advice that pre and post exercise glucose  checks will be done for 3 sessions at entry of program.   Pulmonary Rehab from 07/05/2017 in [facility] Cardiac and Pulmonary Rehab  Date  03/28/17  Educator  Sb  Instruction Review Code  1- Verbalizes Understanding      Chronic Lung Diseases: - Group verbal and written instruction to review new updates, new respiratory medications, new advancements in procedures and treatments. Provide informative websites and "800" numbers of self-education.   Pulmonary Rehab from 07/05/2017 in [facility] Cardiac and Pulmonary Rehab  Date  05/03/17  Educator  [facility]  Instruction Review Code  1- Verbalizes Understanding      Lung Procedures: - Group verbal and written instruction to describe testing methods done to diagnose lung disease. Review the outcome of test results. Describe the treatment choices: Pulmonary Function Tests, ABGs and oximetry.   Energy Conservation: - Provide group verbal and written instruction for methods to conserve energy, plan and organize activities. Instruct on pacing techniques, use of adaptive equipment and posture/positioning to relieve shortness of breath.   Pulmonary Rehab from  07/05/2017 in [facility] Cardiac and Pulmonary Rehab  Date  04/12/17  Educator  [facility]  Instruction Review Code  1- Verbalizes Understanding      Triggers: - Group verbal and written instruction to review types of environmental controls: home humidity, furnaces, filters, dust mite/pet prevention, HEPA vacuums. To discuss weather changes, air quality and the benefits of nasal washing.   Pulmonary Rehab from 07/05/2017 in [facility] Cardiac and Pulmonary Rehab  Date  06/07/17  Educator  [facility]  Instruction Review Code  1- Verbalizes Understanding      Exacerbations: - Group verbal and written instruction to provide: warning signs, infection symptoms, calling MD promptly, preventive modes, and value of vaccinations. Review: effective airway clearance, coughing and/or vibration techniques. Create an Sports administrator.   Pulmonary Rehab from 07/05/2017 in [facility] Cardiac and Pulmonary Rehab  Date  06/07/17  Educator  [facility]  Instruction Review Code  1- Verbalizes Understanding      Oxygen: - Individual and group verbal and written instruction on oxygen therapy. Includes supplement oxygen, available portable oxygen systems, continuous and intermittent flow rates, oxygen safety, concentrators, and Medicare reimbursement for oxygen.   Respiratory Medications: - Group verbal and written instruction to review medications for lung disease. Drug class, frequency, complications, importance of spacers, rinsing mouth after steroid MDI's, and proper cleaning methods for nebulizers.   AED/CPR: - Group verbal and written instruction with the use of models to demonstrate the basic use of the AED with the basic ABC's of resuscitation.   Breathing Retraining: - Provides individuals verbal and written instruction on purpose, frequency, and proper technique of diaphragmatic breathing and pursed-lipped breathing. Applies individual practice skills.   Pulmonary Rehab from 07/05/2017 in [facility] Cardiac and Pulmonary Rehab  Date  03/28/17   Educator  Sb  Instruction Review Code  1- Verbalizes Understanding      Anatomy and Physiology of the Lungs: - Group verbal and written instruction with the use of models to provide basic lung anatomy and physiology related to function, structure and complications of lung disease.   Pulmonary Rehab from 07/05/2017 in [facility] Cardiac and Pulmonary Rehab  Date  07/05/17  Educator  [facility]  Instruction Review Code  1- Verbalizes Understanding      Anatomy & Physiology of the Heart: - Group verbal and written instruction and models provide basic cardiac anatomy and physiology, with the coronary electrical and arterial systems. Review of: AMI, Angina, Valve disease, Heart Failure, Cardiac  Arrhythmia, Pacemakers, and the ICD.   Heart Failure: - Group verbal and written instruction on the basics of heart failure: signs/symptoms, treatments, explanation of ejection fraction, enlarged heart and cardiomyopathy.   Sleep Apnea: - Individual verbal and written instruction to review Obstructive Sleep Apnea. Review of risk factors, methods for diagnosing and types of masks and machines for OSA.   Pulmonary Rehab from 07/05/2017 in [facility] Cardiac and Pulmonary Rehab  Date  06/07/17  Educator  [facility]  Instruction Review Code  1- Verbalizes Understanding      Anxiety: - Provides group, verbal and written instruction on the correlation between heart/lung disease and anxiety, treatment options, and management of anxiety.   Relaxation: - Provides group, verbal and written instruction about the benefits of relaxation for patients with heart/lung disease. Also provides patients with examples of relaxation techniques.   Cardiac Medications: - Group verbal and written instruction to review commonly prescribed medications for heart disease. Reviews the medication, class of the drug, and side effects.   Know Your Numbers: -Group verbal and written instruction about important numbers in your health.  Review of  Cholesterol, Blood Pressure, Diabetes, and BMI and the role they play in your overall health.   Other: -Provides group and verbal instruction on various topics (see comments)    Knowledge Questionnaire Score: Knowledge Questionnaire Score - 03/28/17 1320      Knowledge Questionnaire Score   Pre Score  6/10 reviewed correct response with Mikki Santee and he verbalized understanding of the response        Core Components/Risk Factors/Patient Goals at Admission: Personal Goals and Risk Factors at Admission - 03/28/17 1338      Core Components/Risk Factors/Patient Goals on Admission   Improve shortness of breath with ADL's  Yes    Intervention  Provide education, individualized exercise plan and daily activity instruction to help decrease symptoms of SOB with activities of daily living.    Expected Outcomes  Short Term: Achieves a reduction of symptoms when performing activities of daily living.  (Significant)  Long term goal: Improved ability to perform ADL's with less SOB    Develop more efficient breathing techniques such as purse lipped breathing and diaphragmatic breathing; and practicing self-pacing with activity  Yes    Intervention  Provide education, demonstration and support about specific breathing techniuqes utilized for more efficient breathing. Include techniques such as pursed lipped breathing, diaphragmatic breathing and self-pacing activity.    Expected Outcomes  Short Term: Participant will be able to demonstrate and use breathing techniques as needed throughout daily activities. Long term goal: Continued use of techniques learned to improve efficiency of breathing    Increase knowledge of respiratory medications and ability to use respiratory devices properly   Yes    Intervention  Provide education and demonstration as needed of appropriate use of medications, inhalers, and oxygen therapy.    Expected Outcomes  Short Term: Achieves understanding of medications use. Understands that  oxygen is a medication prescribed by physician. Demonstrates appropriate use of inhaler and oxygen therapy. Long Term Goal: Maintains ability to utilize medications, inhalers and oxygen therapy    Diabetes  Yes    Intervention  Provide education about signs/symptoms and action to take for hypo/hyperglycemia.;Provide education about proper nutrition, including hydration, and aerobic/resistive exercise prescription along with prescribed medications to achieve blood glucose in normal ranges: Fasting glucose 65-99 mg/dL    Expected Outcomes  Short Term: Participant verbalizes understanding of the signs/symptoms and immediate care of hyper/hypoglycemia, proper foot care and  importance of medication, aerobic/resistive exercise and nutrition plan for blood glucose control.;Long Term: Attainment of HbA1C < 7%.    Hypertension  Yes    Intervention  Provide education on lifestyle modifcations including regular physical activity/exercise, weight management, moderate sodium restriction and increased consumption of fresh fruit, vegetables, and low fat dairy, alcohol moderation, and smoking cessation.;Monitor prescription use compliance.    Expected Outcomes  Short Term: Continued assessment and intervention until BP is < 140/33m HG in hypertensive participants. < 130/813mHG in hypertensive participants with diabetes, heart failure or chronic kidney disease.;Long Term: Maintenance of blood pressure at goal levels.    Lipids  Yes    Intervention  Provide education and support for participant on nutrition & aerobic/resistive exercise along with prescribed medications to achieve LDL <707mHDL >109m64m  Expected Outcomes  Short Term: Participant states understanding of desired cholesterol values and is compliant with medications prescribed. Participant is following exercise prescription and nutrition guidelines.;Long Term: Cholesterol controlled with medications as prescribed, with individualized exercise RX and with  personalized nutrition plan. Value goals: LDL < 70mg26mL > 40 mg.       Core Components/Risk Factors/Patient Goals Review:  Goals and Risk Factor Review    Row Name 04/19/17 1355 05/15/17 1622 05/29/17 1638         Core Components/Risk Factors/Patient Goals Review   Personal Goals Review  Weight Management/Obesity;Lipids;Hypertension;Diabetes  Weight Management/Obesity;Improve shortness of breath with ADL's;Hypertension;Diabetes;Lipids;Increase knowledge of respiratory medications and ability to use respiratory devices properly.  Weight Management/Obesity;Improve shortness of breath with ADL's;Stress;Diabetes     Review  RoberMichelsht has been steady and wants to lose weight. He has not met with the nutritionist. His blood pressure has been under control since the start of the program. He does not know his last lipids results. Blood sugars have been checked and within acceptable ranges. He also checks his blood sugar regularly at home.  RoberOrvales he has been felling better and breathing a little easier. He takes his albuterol and his inhalers as prescribed but has no interest in using a spacer. His blood pressure has been stable since the start of the program. His weight has increase slightyl due to vacations he has been going on and has scheduled.  RoberDonivand like to lose some weight and improve his shortness of breath. His stress is very minimal. His diabetes has been under control and checks regularly at home.     Expected Outcomes  Short: Continue to lose weight. Long: Maintain weight after weight loss   Short: convince Rambo to use a spacer with his inhaler. Long: Use spacer and take medications independently.  Short: attend [facility] regularly to lose weight. Long: maintain weightloss with an exercise routine.        Core Components/Risk Factors/Patient Goals at Discharge (Final Review):  Goals and Risk Factor Review - 05/29/17 1638      Core Components/Risk Factors/Patient Goals  Review   Personal Goals Review  Weight Management/Obesity;Improve shortness of breath with ADL's;Stress;Diabetes    Review  RoberAlfonsad like to lose some weight and improve his shortness of breath. His stress is very minimal. His diabetes has been under control and checks regularly at home.    Expected Outcomes  Short: attend [facility] regularly to lose weight. Long: maintain weightloss with an exercise routine.       ITP Comments: ITP Comments    Row Name 03/28/17 1230 03/28/17 1403 04/24/17 0831 04/26/17 1015 05/03/17 1221   ITP  Comments  Medical review completed. INitial ITP created and sent to Dr Emily Filbert for signature and suggested changes.   Documentation of diagnosis can be found in [facility] 03/20/2017 visit  Patient met with Dr. Emily Filbert director of [place] today for face to face contact.  30 day review completed. ITP sent to Dr. Emily Filbert Director of [place]. Continue with ITP unless changes are made by physician.    Krishay was absent today and could not see Dr. Emily Filbert for face to face and chart review.  Kaelon seen Dr. Lequita Halt who signed for Dr. Emily Filbert Director of [place] for face to face and chart review   Row Name 05/22/17 0823 06/02/17 1011 06/05/17 1039 06/19/17 0835 07/04/17 1004   ITP Comments  30 day review completed. ITP sent to Dr. Emily Filbert Director of [place]. Continue with ITP unless changes are made by physician.    Abrahim was not in [place] today to meet face to face with Dr. Sabra Heck Director of [place].  Met Dr. Sabra Heck director of [place] face to face for chart signature and review.  30 day review completed. ITP sent to Dr. Emily Filbert Director of [place]. Continue with ITP unless changes are made by physician.    Called Syaire to see how he was doing. He was on vacation and plans to return tomorrow.   [place] Name 07/05/17 1102 07/17/17 0822 07/17/17 0823 07/19/17 1355 08/04/17 0936   ITP Comments  Herbie [place] met face to face with Dr. Emily Filbert director  of [place], chart reviewed and signed  Unable to obtain goals due to lack off attendance  30 day review completed. ITP sent to Dr. Emily Filbert Director of [place]. Continue with ITP unless changes are made by physician.    Mikki Santee has only attended once in December due to scheduled trips.  Nil is going out of town and has been busy with the holidays. He wants to resume when he gets back from his cruise and states he will try to be back August 14 2017.   [place] Name 08/14/17 0824 08/14/17 0825         ITP Comments  goals not obtained patient has not attended [facility] in over a month  30 day review completed. ITP sent to Dr. Emily Filbert Director of [place]. Continue with ITP unless changes are made by physician.         Comments: 30 day review

## 2017-08-16 DIAGNOSIS — Z87891 Personal history of nicotine dependence: Secondary | ICD-10-CM | POA: Diagnosis not present

## 2017-08-16 DIAGNOSIS — E119 Type 2 diabetes mellitus without complications: Secondary | ICD-10-CM | POA: Diagnosis not present

## 2017-08-16 DIAGNOSIS — I10 Essential (primary) hypertension: Secondary | ICD-10-CM | POA: Diagnosis not present

## 2017-08-16 DIAGNOSIS — J449 Chronic obstructive pulmonary disease, unspecified: Secondary | ICD-10-CM | POA: Diagnosis not present

## 2017-08-16 DIAGNOSIS — Z79899 Other long term (current) drug therapy: Secondary | ICD-10-CM | POA: Diagnosis not present

## 2017-08-16 DIAGNOSIS — Z7984 Long term (current) use of oral hypoglycemic drugs: Secondary | ICD-10-CM | POA: Diagnosis not present

## 2017-08-16 NOTE — Progress Notes (Signed)
Daily Session Note  Patient Details  Name: Cody Bridges MRN: [id number] Date of Birth: 03-19-1933 Referring Provider:    Encounter Date: 08/16/2017  Check In: Session Check In - 08/16/17 1015      Check-In   Location  [facility]    Staff Present  Nada Maclachlan, BA, ACSM CEP, Exercise Physiologist;Kainat Pizana Darrin Nipper, [place], RCEP, [place], Exercise Physiologist    Supervising physician immediately available to respond to emergencies  [facility] immediately available ER MD    Physician(s)  Dr. Alfred Levins and Burlene Arnt    Medication changes reported      No    Fall or balance concerns reported     No    Warm-up and Cool-down  Performed as group-led instruction    Resistance Training Performed  Yes    VAD Patient?  No      Pain Assessment   Currently in Pain?  No/denies          Social History   Tobacco Use  Smoking Status Former Smoker  . Packs/day: 2.00  . Years: 35.00  . Pack years: 70.00  . Types: Cigarettes  . Last attempt to quit: 10/30/1985  . Years since quitting: 31.8  Smokeless Tobacco Never Used    Goals Met:  Independence with exercise equipment Exercise tolerated well No report of cardiac concerns or symptoms Strength training completed today  Goals Unmet:  Not Applicable  Comments: Pt able to follow exercise prescription today without complaint.  Will continue to monitor for progression.   Dr. Emily Filbert is Medical Director for [place] and [facility].

## 2017-08-17 DIAGNOSIS — H2512 Age-related nuclear cataract, left eye: Secondary | ICD-10-CM | POA: Diagnosis not present

## 2017-08-18 DIAGNOSIS — J449 Chronic obstructive pulmonary disease, unspecified: Secondary | ICD-10-CM

## 2017-08-18 DIAGNOSIS — Z87891 Personal history of nicotine dependence: Secondary | ICD-10-CM | POA: Diagnosis not present

## 2017-08-18 DIAGNOSIS — Z79899 Other long term (current) drug therapy: Secondary | ICD-10-CM | POA: Diagnosis not present

## 2017-08-18 DIAGNOSIS — E119 Type 2 diabetes mellitus without complications: Secondary | ICD-10-CM | POA: Diagnosis not present

## 2017-08-18 DIAGNOSIS — Z7984 Long term (current) use of oral hypoglycemic drugs: Secondary | ICD-10-CM | POA: Diagnosis not present

## 2017-08-18 DIAGNOSIS — I10 Essential (primary) hypertension: Secondary | ICD-10-CM | POA: Diagnosis not present

## 2017-08-18 NOTE — Progress Notes (Signed)
Daily Session Note  Patient Details  Name: Cody Bridges MRN: [id number] Date of Birth: 06-27-1933 Referring Provider:    Encounter Date: 08/18/2017  Check In: Session Check In - 08/18/17 1001      Check-In   Location  [facility]    Staff Present  Nada Maclachlan, BA, ACSM CEP, Exercise Physiologist;Vadis Slabach Alcus Dad, RN BSN    Supervising physician immediately available to respond to emergencies  [facility] immediately available ER MD    Physician(s)  Dr. Jodell [facility]    Medication changes reported      No    Fall or balance concerns reported     No    Warm-up and Cool-down  Performed as group-led instruction    Resistance Training Performed  Yes    VAD Patient?  No      Pain Assessment   Currently in Pain?  No/denies          Social History   Tobacco Use  Smoking Status Former Smoker  . Packs/day: 2.00  . Years: 35.00  . Pack years: 70.00  . Types: Cigarettes  . Last attempt to quit: 10/30/1985  . Years since quitting: 31.8  Smokeless Tobacco Never Used    Goals Met:  Independence with exercise equipment Exercise tolerated well No report of cardiac concerns or symptoms Strength training completed today  Goals Unmet:  Not Applicable  Comments: Pt able to follow exercise prescription today without complaint.  Will continue to monitor for progression.   Dr. Emily Filbert is Medical Director for [place] and [facility].

## 2017-08-21 ENCOUNTER — Encounter: Payer: Self-pay | Admitting: *Deleted

## 2017-08-22 ENCOUNTER — Encounter: Payer: Self-pay | Admitting: *Deleted

## 2017-08-22 ENCOUNTER — Ambulatory Visit
Admission: RE | Admit: 2017-08-22 | Discharge: 2017-08-22 | Disposition: A | Discharge status: Home / Self Care | Payer: Medicare Other | Source: Ambulatory Visit | Attending: Ophthalmology | Admitting: Ophthalmology

## 2017-08-22 ENCOUNTER — Other Ambulatory Visit: Payer: Self-pay

## 2017-08-22 ENCOUNTER — Ambulatory Visit: Payer: Medicare Other | Admitting: Anesthesiology

## 2017-08-22 ENCOUNTER — Encounter
Admission: RE | Disposition: A | Discharge status: Home / Self Care | Payer: Self-pay | Source: Ambulatory Visit | Attending: Ophthalmology

## 2017-08-22 DIAGNOSIS — Z7984 Long term (current) use of oral hypoglycemic drugs: Secondary | ICD-10-CM | POA: Insufficient documentation

## 2017-08-22 DIAGNOSIS — H2512 Age-related nuclear cataract, left eye: Secondary | ICD-10-CM | POA: Insufficient documentation

## 2017-08-22 DIAGNOSIS — E7800 Pure hypercholesterolemia, unspecified: Secondary | ICD-10-CM | POA: Insufficient documentation

## 2017-08-22 DIAGNOSIS — I10 Essential (primary) hypertension: Secondary | ICD-10-CM | POA: Diagnosis not present

## 2017-08-22 DIAGNOSIS — H9193 Unspecified hearing loss, bilateral: Secondary | ICD-10-CM | POA: Diagnosis not present

## 2017-08-22 DIAGNOSIS — Z87891 Personal history of nicotine dependence: Secondary | ICD-10-CM | POA: Diagnosis not present

## 2017-08-22 DIAGNOSIS — E119 Type 2 diabetes mellitus without complications: Secondary | ICD-10-CM | POA: Insufficient documentation

## 2017-08-22 DIAGNOSIS — J449 Chronic obstructive pulmonary disease, unspecified: Secondary | ICD-10-CM | POA: Diagnosis not present

## 2017-08-22 DIAGNOSIS — J439 Emphysema, unspecified: Secondary | ICD-10-CM | POA: Insufficient documentation

## 2017-08-22 DIAGNOSIS — Z7951 Long term (current) use of inhaled steroids: Secondary | ICD-10-CM | POA: Diagnosis not present

## 2017-08-22 DIAGNOSIS — K219 Gastro-esophageal reflux disease without esophagitis: Secondary | ICD-10-CM | POA: Diagnosis not present

## 2017-08-22 HISTORY — DX: Chronic obstructive pulmonary disease, unspecified: J44.9

## 2017-08-22 HISTORY — DX: Edema, unspecified: R60.9

## 2017-08-22 HISTORY — DX: Unspecified hearing loss, unspecified ear: H91.90

## 2017-08-22 HISTORY — PX: CATARACT EXTRACTION W/PHACO: SHX586

## 2017-08-22 HISTORY — DX: Wheezing: R06.2

## 2017-08-22 LAB — GLUCOSE, CAPILLARY: GLUCOSE-CAPILLARY: 111 mg/dL — AB (ref 65–99)

## 2017-08-22 SURGERY — PHACOEMULSIFICATION, CATARACT, WITH IOL INSERTION
Anesthesia: Monitor Anesthesia Care | Site: Eye | Laterality: Left | Wound class: Clean

## 2017-08-22 MED ORDER — NA CHONDROIT SULF-NA HYALURON 40-17 MG/ML IO SOLN
INTRAOCULAR | Status: DC | PRN
  Administered 2017-08-22: 1 mL via INTRAOCULAR

## 2017-08-22 MED ORDER — MOXIFLOXACIN HCL 0.5 % OP SOLN
OPHTHALMIC | Status: AC
  Filled 2017-08-22: qty 3

## 2017-08-22 MED ORDER — ONDANSETRON HCL 4 MG/2ML IJ SOLN: 4.0000 mg | Freq: Once | INTRAMUSCULAR | Status: DC | PRN

## 2017-08-22 MED ORDER — SODIUM CHLORIDE 0.9 % IV SOLN
INTRAVENOUS | Status: DC
  Administered 2017-08-22: 08:00:00 via INTRAVENOUS

## 2017-08-22 MED ORDER — NA CHONDROIT SULF-NA HYALURON 40-17 MG/ML IO SOLN
INTRAOCULAR | Status: AC
  Filled 2017-08-22: qty 1

## 2017-08-22 MED ORDER — ARMC OPHTHALMIC DILATING DROPS
OPHTHALMIC | Status: AC
  Administered 2017-08-22: 1 via OPHTHALMIC
  Filled 2017-08-22: qty 0.4

## 2017-08-22 MED ORDER — LIDOCAINE HCL (PF) 4 % IJ SOLN
INTRAOCULAR | Status: DC | PRN
  Administered 2017-08-22: 4 mL via OPHTHALMIC

## 2017-08-22 MED ORDER — LIDOCAINE HCL (PF) 4 % IJ SOLN
INTRAMUSCULAR | Status: AC
  Filled 2017-08-22: qty 5

## 2017-08-22 MED ORDER — EPINEPHRINE PF 1 MG/ML IJ SOLN
INTRAMUSCULAR | Status: AC
  Filled 2017-08-22: qty 2

## 2017-08-22 MED ORDER — MIDAZOLAM HCL 2 MG/2ML IJ SOLN
INTRAMUSCULAR | Status: AC
  Filled 2017-08-22: qty 2

## 2017-08-22 MED ORDER — EPINEPHRINE PF 1 MG/ML IJ SOLN
INTRAOCULAR | Status: DC | PRN
  Administered 2017-08-22: 08:00:00 via OPHTHALMIC

## 2017-08-22 MED ORDER — FENTANYL CITRATE (PF) 100 MCG/2ML IJ SOLN
INTRAMUSCULAR | Status: DC | PRN
  Administered 2017-08-22: 25 ug via INTRAVENOUS

## 2017-08-22 MED ORDER — POVIDONE-IODINE 5 % OP SOLN
OPHTHALMIC | Status: AC
  Filled 2017-08-22: qty 30

## 2017-08-22 MED ORDER — FENTANYL CITRATE (PF) 100 MCG/2ML IJ SOLN
INTRAMUSCULAR | Status: AC
  Filled 2017-08-22: qty 2

## 2017-08-22 MED ORDER — POVIDONE-IODINE 5 % OP SOLN
OPHTHALMIC | Status: DC | PRN
  Administered 2017-08-22: 1 via OPHTHALMIC

## 2017-08-22 MED ORDER — MOXIFLOXACIN HCL 0.5 % OP SOLN: 1.0000 [drp] | OPHTHALMIC | Status: DC | PRN

## 2017-08-22 MED ORDER — MIDAZOLAM HCL 2 MG/2ML IJ SOLN
INTRAMUSCULAR | Status: DC | PRN
  Administered 2017-08-22: 0.5 mg via INTRAVENOUS

## 2017-08-22 MED ORDER — ARMC OPHTHALMIC DILATING DROPS
1.0000 "application " | OPHTHALMIC | Status: AC
  Administered 2017-08-22 (×3): 1 via OPHTHALMIC

## 2017-08-22 MED ORDER — CARBACHOL 0.01 % IO SOLN
INTRAOCULAR | Status: DC | PRN
  Administered 2017-08-22: 0.5 mL via INTRAOCULAR

## 2017-08-22 MED ORDER — MOXIFLOXACIN HCL 0.5 % OP SOLN
OPHTHALMIC | Status: DC | PRN
  Administered 2017-08-22: 0.2 mL via OPHTHALMIC

## 2017-08-22 SURGICAL SUPPLY — 16 items
GLOVE BIO SURGEON STRL SZ8 (GLOVE) ×2 IMPLANT
GLOVE BIOGEL M 6.5 STRL (GLOVE) ×2 IMPLANT
GLOVE SURG LX 8.0 MICRO (GLOVE) ×1
GLOVE SURG LX STRL 8.0 MICRO (GLOVE) ×1 IMPLANT
GOWN STRL REUS W/ TWL LRG LVL3 (GOWN DISPOSABLE) ×2 IMPLANT
GOWN STRL REUS W/TWL LRG LVL3 (GOWN DISPOSABLE) ×2
LABEL CATARACT MEDS ST (LABEL) ×2 IMPLANT
LENS IOL TECNIS ITEC 17.0 (Intraocular Lens) ×2 IMPLANT
PACK CATARACT (MISCELLANEOUS) ×2 IMPLANT
PACK CATARACT BRASINGTON LX (MISCELLANEOUS) ×2 IMPLANT
PACK EYE AFTER SURG (MISCELLANEOUS) ×2 IMPLANT
SOL BSS BAG (MISCELLANEOUS) ×2
SOLUTION BSS BAG (MISCELLANEOUS) ×1 IMPLANT
SYR 5ML LL (SYRINGE) ×2 IMPLANT
WATER STERILE IRR 250ML POUR (IV SOLUTION) ×2 IMPLANT
WIPE NON LINTING 3.25X3.25 (MISCELLANEOUS) ×2 IMPLANT

## 2017-08-22 NOTE — Anesthesia Procedure Notes (Signed)
Procedure Name: MAC Performed by: Marlen Mollica, CRNA Pre-anesthesia Checklist: Patient identified, Emergency Drugs available, Suction available, Patient being monitored and Timeout performed Oxygen Delivery Method: Nasal cannula       

## 2017-08-22 NOTE — Anesthesia Postprocedure Evaluation (Signed)
Anesthesia Post Note  Patient: Cody Bridges  Procedure(s) Performed: CATARACT EXTRACTION PHACO AND INTRAOCULAR LENS PLACEMENT ([place]) (Left Eye)  Patient location during evaluation: PACU Anesthesia Type: MAC Level of consciousness: awake Pain management: pain level controlled Vital Signs Assessment: post-procedure vital signs reviewed and stable Respiratory status: spontaneous breathing Cardiovascular status: blood pressure returned to baseline Postop Assessment: no apparent nausea or vomiting Anesthetic complications: no     Last Vitals:  Vitals:   08/22/17 0724 08/22/17 0836  BP: 121/71 112/73  Pulse: 80 67  Resp: 16 16  Temp: (!) 35.9 C 36.7 C  SpO2: 99% 100%    Last Pain:  Vitals:   08/22/17 0836  TempSrc: Oral                 Hedda Slade

## 2017-08-22 NOTE — Discharge Instructions (Signed)
Eye Surgery Discharge Instructions  Expect mild scratchy sensation or mild soreness. DO NOT RUB YOUR EYE!  The day of surgery:  Minimal physical activity, but bed rest is not required  No reading, computer work, or close hand work  No bending, lifting, or straining.  May watch TV  For 24 hours:  No driving, legal decisions, or alcoholic beverages  Safety precautions  Eat anything you prefer: It is better to start with liquids, then soup then solid foods.  _____ Eye patch should be worn until postoperative exam tomorrow.  ____ Solar shield eyeglasses should be worn for comfort in the sunlight/patch while sleeping  Resume all regular medications including aspirin or Coumadin if these were discontinued prior to surgery. You may shower, bathe, shave, or wash your hair. Tylenol may be taken for mild discomfort.  Call your doctor if you experience significant pain, nausea, or vomiting, fever > 101 or other signs of infection. [phone] or [phone] Specific instructions:  Follow-up Information    Birder Robson, MD Follow up.   Specialty:  Ophthalmology Why:  January 23 at 10:45am Contact information: [address] [place] [address] [phone]

## 2017-08-22 NOTE — H&P (Signed)
All labs reviewed. Abnormal studies sent to patients PCP when indicated.  Previous H&P reviewed, patient examined, there are NO CHANGES.  Cody Tanori Porfilio1/22/20198:12 AM

## 2017-08-22 NOTE — Anesthesia Preprocedure Evaluation (Signed)
Anesthesia Evaluation  Patient identified by MRN, date of birth, ID band Patient awake    Reviewed: Allergy & Precautions, NPO status , Patient's Chart, lab work & pertinent test results  History of Anesthesia Complications Negative for: history of anesthetic complications  Airway Mallampati: III       Dental  (+) Partial Upper   Pulmonary asthma , neg sleep apnea, COPD,  COPD inhaler, neg recent URI, former smoker,           Cardiovascular hypertension, Pt. on medications (-) Past MI and (-) CHF (-) dysrhythmias (-) Valvular Problems/Murmurs     Neuro/Psych neg Seizures    GI/Hepatic Neg liver ROS, GERD  ,  Endo/Other  diabetes, Type 2, Oral Hypoglycemic Agents  Renal/GU negative Renal ROS     Musculoskeletal   Abdominal   Peds  Hematology   Anesthesia Other Findings   Reproductive/Obstetrics                             Anesthesia Physical Anesthesia Plan  ASA: III  Anesthesia Plan: MAC   Post-op Pain Management:    Induction:   PONV Risk Score and Plan:   Airway Management Planned: Nasal Cannula  Additional Equipment:   Intra-op Plan:   Post-operative Plan:   Informed Consent: I have reviewed the patients History and Physical, chart, labs and discussed the procedure including the risks, benefits and alternatives for the proposed anesthesia with the patient or authorized representative who has indicated his/her understanding and acceptance.     Plan Discussed with:   Anesthesia Plan Comments:         Anesthesia Quick Evaluation

## 2017-08-22 NOTE — Op Note (Signed)
PREOPERATIVE DIAGNOSIS:  Nuclear sclerotic cataract of the left eye.   POSTOPERATIVE DIAGNOSIS:  Nuclear sclerotic cataract of the left eye.   OPERATIVE PROCEDURE: Procedure(s): CATARACT EXTRACTION PHACO AND INTRAOCULAR LENS PLACEMENT ([facility])   SURGEON:  Birder Robson, MD.   ANESTHESIA:  Anesthesiologist: Gunnar Fusi, MD CRNA: Demetrius Charity, CRNA; Hedda Slade, CRNA  1.      Managed anesthesia care. 2.     0.68ml of Shugarcaine was instilled following the paracentesis   COMPLICATIONS:  None.   TECHNIQUE:   Stop and chop   DESCRIPTION OF PROCEDURE:  The patient was examined and consented in the preoperative holding area where the aforementioned topical anesthesia was applied to the left eye and then brought back to the Operating Room where the left eye was prepped and draped in the usual sterile ophthalmic fashion and a lid speculum was placed. A paracentesis was created with the side port blade and the anterior chamber was filled with viscoelastic. A near clear corneal incision was performed with the steel keratome. A continuous curvilinear capsulorrhexis was performed with a cystotome followed by the capsulorrhexis forceps. Hydrodissection and hydrodelineation were carried out with BSS on a blunt cannula. The lens was removed in a stop and chop  technique and the remaining cortical material was removed with the irrigation-aspiration handpiece. The capsular bag was inflated with viscoelastic and the Technis [id number] lens was placed in the capsular bag without complication. The remaining viscoelastic was removed from the eye with the irrigation-aspiration handpiece. The wounds were hydrated. The anterior chamber was flushed with Miostat and the eye was inflated to physiologic pressure. 0.53ml Vigamox was placed in the anterior chamber. The wounds were found to be water tight. The eye was dressed with Vigamox. The patient was given protective glasses to wear throughout the day and a shield  with which to sleep tonight. The patient was also given drops with which to begin a drop regimen today and will follow-up with me in one day. Implant Name Type Inv. Item Serial No. Manufacturer Lot No. LRB No. Used  LENS IOL DIOP 17.0 - [id number] Intraocular Lens LENS IOL DIOP 17.0 [id number]  Left 1    Procedure(s) with comments: CATARACT EXTRACTION PHACO AND INTRAOCULAR LENS PLACEMENT ([facility]) (Left) - [place] 00:43.7 AP% 12.3 CDE 5.38 Fluid pack lot # [id number]  Electronically signed: Birder Robson 08/22/2017 8:34 AM

## 2017-08-22 NOTE — Anesthesia Post-op Follow-up Note (Signed)
Anesthesia QCDR form completed.        

## 2017-08-22 NOTE — Transfer of Care (Signed)
Immediate Anesthesia Transfer of Care Note  Patient: Cody Bridges  Procedure(s) Performed: CATARACT EXTRACTION PHACO AND INTRAOCULAR LENS PLACEMENT ([place]) (Left Eye)  Patient Location: PACU  Anesthesia Type:MAC  Level of Consciousness: awake, alert  and oriented  Airway & Oxygen Therapy: Patient Spontanous Breathing  Post-op Assessment: Report given to RN and Post -op Vital signs reviewed and stable  Post vital signs: Reviewed and stable  Last Vitals:  Vitals:   08/22/17 0724 08/22/17 0836  BP: 121/71 112/73  Pulse: 80 67  Resp: 16 16  Temp: (!) 35.9 C 36.7 C  SpO2: 99% 100%    Last Pain:  Vitals:   08/22/17 0836  TempSrc: Oral         Complications: No apparent anesthesia complications

## 2017-08-23 ENCOUNTER — Encounter: Payer: Self-pay | Admitting: Ophthalmology

## 2017-08-23 ENCOUNTER — Ambulatory Visit: Payer: Medicare Other | Admitting: Family Medicine

## 2017-08-28 DIAGNOSIS — Z79899 Other long term (current) drug therapy: Secondary | ICD-10-CM | POA: Diagnosis not present

## 2017-08-28 DIAGNOSIS — J449 Chronic obstructive pulmonary disease, unspecified: Secondary | ICD-10-CM | POA: Diagnosis not present

## 2017-08-28 DIAGNOSIS — Z87891 Personal history of nicotine dependence: Secondary | ICD-10-CM | POA: Diagnosis not present

## 2017-08-28 DIAGNOSIS — Z7984 Long term (current) use of oral hypoglycemic drugs: Secondary | ICD-10-CM | POA: Diagnosis not present

## 2017-08-28 DIAGNOSIS — E119 Type 2 diabetes mellitus without complications: Secondary | ICD-10-CM | POA: Diagnosis not present

## 2017-08-28 DIAGNOSIS — I10 Essential (primary) hypertension: Secondary | ICD-10-CM | POA: Diagnosis not present

## 2017-08-28 NOTE — Progress Notes (Signed)
Daily Session Note  Patient Details  Name: Cody Bridges MRN: [id number] Date of Birth: 1932/12/31 Referring Provider:    Encounter Date: 08/28/2017  Check In: Session Check In - 08/28/17 1035      Check-In   Location  [facility]    Staff Present  Nada Maclachlan, BA, ACSM CEP, Exercise Physiologist;Kelly Amedeo Plenty, BS, ACSM CEP, Exercise Physiologist;Joseph Flavia Shipper    Supervising physician immediately available to respond to emergencies  [facility] immediately available ER MD    Physician(s)  Dr. Lamar Laundry and Mariea Clonts    Medication changes reported      No    Fall or balance concerns reported     No    Warm-up and Cool-down  Performed as group-led instruction    Resistance Training Performed  Yes    VAD Patient?  No      Pain Assessment   Currently in Pain?  No/denies          Social History   Tobacco Use  Smoking Status Former Smoker  . Packs/day: 2.00  . Years: 35.00  . Pack years: 70.00  . Types: Cigarettes  . Last attempt to quit: 10/30/1985  . Years since quitting: 31.8  Smokeless Tobacco Never Used    Goals Met:  Independence with exercise equipment Exercise tolerated well No report of cardiac concerns or symptoms Strength training completed today  Goals Unmet:  Not Applicable  Comments: Pt able to follow exercise prescription today without complaint.  Will continue to monitor for progression.   Dr. Emily Filbert is Medical Director for [place] and [facility].

## 2017-08-30 DIAGNOSIS — J449 Chronic obstructive pulmonary disease, unspecified: Secondary | ICD-10-CM

## 2017-08-30 DIAGNOSIS — E119 Type 2 diabetes mellitus without complications: Secondary | ICD-10-CM | POA: Diagnosis not present

## 2017-08-30 DIAGNOSIS — I10 Essential (primary) hypertension: Secondary | ICD-10-CM | POA: Diagnosis not present

## 2017-08-30 DIAGNOSIS — Z87891 Personal history of nicotine dependence: Secondary | ICD-10-CM | POA: Diagnosis not present

## 2017-08-30 DIAGNOSIS — Z79899 Other long term (current) drug therapy: Secondary | ICD-10-CM | POA: Diagnosis not present

## 2017-08-30 DIAGNOSIS — Z7984 Long term (current) use of oral hypoglycemic drugs: Secondary | ICD-10-CM | POA: Diagnosis not present

## 2017-08-30 NOTE — Progress Notes (Signed)
Daily Session Note  Patient Details  Name: Dajaun Goldring Wegener MRN: [id number] Date of Birth: 05-10-1933 Referring Provider:    Encounter Date: 08/30/2017  Check In: Session Check In - 08/30/17 1016      Check-In   Location  [facility]    Staff Present  Nada Maclachlan, BA, ACSM CEP, Exercise Physiologist;Jessica Luan Pulling, MA, RCEP, CCRP, Exercise Physiologist;Ethelean Colla Flavia Shipper    Supervising physician immediately available to respond to emergencies  [facility] immediately available ER MD    Physician(s)  Dr. Jimmye Norman and Alfred Levins    Medication changes reported      No    Fall or balance concerns reported     No    Warm-up and Cool-down  Performed as group-led instruction    Resistance Training Performed  Yes    VAD Patient?  No      Pain Assessment   Currently in Pain?  No/denies          Social History   Tobacco Use  Smoking Status Former Smoker  . Packs/day: 2.00  . Years: 35.00  . Pack years: 70.00  . Types: Cigarettes  . Last attempt to quit: 10/30/1985  . Years since quitting: 31.8  Smokeless Tobacco Never Used    Goals Met:  Independence with exercise equipment Exercise tolerated well No report of cardiac concerns or symptoms Strength training completed today  Goals Unmet:  Not Applicable  Comments: Pt able to follow exercise prescription today without complaint.  Will continue to monitor for progression.   Dr. Emily Filbert is Medical Director for [place] and [facility].

## 2017-09-01 ENCOUNTER — Encounter: Payer: Medicare Other | Attending: Internal Medicine

## 2017-09-01 DIAGNOSIS — J449 Chronic obstructive pulmonary disease, unspecified: Secondary | ICD-10-CM | POA: Diagnosis not present

## 2017-09-01 DIAGNOSIS — E119 Type 2 diabetes mellitus without complications: Secondary | ICD-10-CM | POA: Diagnosis not present

## 2017-09-01 DIAGNOSIS — Z7984 Long term (current) use of oral hypoglycemic drugs: Secondary | ICD-10-CM | POA: Diagnosis not present

## 2017-09-01 DIAGNOSIS — I10 Essential (primary) hypertension: Secondary | ICD-10-CM | POA: Insufficient documentation

## 2017-09-01 DIAGNOSIS — Z87891 Personal history of nicotine dependence: Secondary | ICD-10-CM | POA: Diagnosis not present

## 2017-09-01 DIAGNOSIS — Z79899 Other long term (current) drug therapy: Secondary | ICD-10-CM | POA: Diagnosis not present

## 2017-09-01 NOTE — Progress Notes (Signed)
Daily Session Note  Patient Details  Name: Cody Bridges MRN: [id number] Date of Birth: 1932-09-07 Referring Provider:    Encounter Date: 09/01/2017  Check In: Session Check In - 09/01/17 1015      Check-In   Location  [facility]    Staff Present  Nada Maclachlan, BA, ACSM CEP, Exercise Physiologist;Meredith Sherryll Burger, RN BSN;Dezzie Badilla Flavia Shipper    Supervising physician immediately available to respond to emergencies  [facility] immediately available ER MD    Physician(s)  Dr. Burlene Arnt and Clearnce Hasten    Medication changes reported      No    Fall or balance concerns reported     No    Warm-up and Cool-down  Performed as group-led instruction    Resistance Training Performed  Yes    VAD Patient?  No      Pain Assessment   Currently in Pain?  No/denies          Social History   Tobacco Use  Smoking Status Former Smoker  . Packs/day: 2.00  . Years: 35.00  . Pack years: 70.00  . Types: Cigarettes  . Last attempt to quit: 10/30/1985  . Years since quitting: 31.8  Smokeless Tobacco Never Used    Goals Met:  Independence with exercise equipment Exercise tolerated well No report of cardiac concerns or symptoms Strength training completed today  Goals Unmet:  Not Applicable  Comments: Pt able to follow exercise prescription today without complaint.  Will continue to monitor for progression.   Dr. Emily Filbert is Medical Director for [place] and [facility].

## 2017-09-04 DIAGNOSIS — E119 Type 2 diabetes mellitus without complications: Secondary | ICD-10-CM | POA: Diagnosis not present

## 2017-09-04 DIAGNOSIS — I10 Essential (primary) hypertension: Secondary | ICD-10-CM | POA: Diagnosis not present

## 2017-09-04 DIAGNOSIS — Z7984 Long term (current) use of oral hypoglycemic drugs: Secondary | ICD-10-CM | POA: Diagnosis not present

## 2017-09-04 DIAGNOSIS — Z79899 Other long term (current) drug therapy: Secondary | ICD-10-CM | POA: Diagnosis not present

## 2017-09-04 DIAGNOSIS — Z87891 Personal history of nicotine dependence: Secondary | ICD-10-CM | POA: Diagnosis not present

## 2017-09-04 DIAGNOSIS — J449 Chronic obstructive pulmonary disease, unspecified: Secondary | ICD-10-CM | POA: Diagnosis not present

## 2017-09-04 NOTE — Progress Notes (Signed)
Daily Session Note  Patient Details  Name: Cody Bridges MRN: [id number] Date of Birth: Oct 15, 1932 Referring Provider:    Encounter Date: 09/04/2017  Check In: Session Check In - 09/04/17 1729      Check-In   Location  [facility]    Staff Present  Nada Maclachlan, BA, ACSM CEP, Exercise Physiologist;Kelly Amedeo Plenty, BS, ACSM CEP, Exercise Physiologist;Jonmichael Beadnell Flavia Shipper    Supervising physician immediately available to respond to emergencies  [facility] immediately available ER MD    Physician(s)  Dr. Quentin Cornwall and Jimmye Norman    Medication changes reported      No    Fall or balance concerns reported     No    Warm-up and Cool-down  Performed as group-led instruction    Resistance Training Performed  Yes    VAD Patient?  No      Pain Assessment   Currently in Pain?  No/denies          Social History   Tobacco Use  Smoking Status Former Smoker  . Packs/day: 2.00  . Years: 35.00  . Pack years: 70.00  . Types: Cigarettes  . Last attempt to quit: 10/30/1985  . Years since quitting: 31.8  Smokeless Tobacco Never Used    Goals Met:  Independence with exercise equipment Exercise tolerated well No report of cardiac concerns or symptoms Strength training completed today  Goals Unmet:  Not Applicable  Comments: Pt able to follow exercise prescription today without complaint.  Will continue to monitor for progression.   Dr. Emily Filbert is Medical Director for [place] and [facility].

## 2017-09-06 DIAGNOSIS — Z87891 Personal history of nicotine dependence: Secondary | ICD-10-CM | POA: Diagnosis not present

## 2017-09-06 DIAGNOSIS — J449 Chronic obstructive pulmonary disease, unspecified: Secondary | ICD-10-CM | POA: Diagnosis not present

## 2017-09-06 DIAGNOSIS — Z7984 Long term (current) use of oral hypoglycemic drugs: Secondary | ICD-10-CM | POA: Diagnosis not present

## 2017-09-06 DIAGNOSIS — E119 Type 2 diabetes mellitus without complications: Secondary | ICD-10-CM | POA: Diagnosis not present

## 2017-09-06 DIAGNOSIS — Z79899 Other long term (current) drug therapy: Secondary | ICD-10-CM | POA: Diagnosis not present

## 2017-09-06 DIAGNOSIS — I10 Essential (primary) hypertension: Secondary | ICD-10-CM | POA: Diagnosis not present

## 2017-09-06 NOTE — Progress Notes (Signed)
Daily Session Note  Patient Details  Name: Cody Bridges MRN: [id number] Date of Birth: [birth year] Referring Provider:    Encounter Date: 09/06/2017  Check In: Session Check In - 09/06/17 1016      Check-In   Location  [facility]    Staff Present  Alberteen Sam, MA, [place], CCRP, Exercise Physiologist;Amanda Oletta Darter, [place], ACSM CEP, Exercise Physiologist;Verenice Westrich Flavia Shipper    Supervising physician immediately available to respond to emergencies  [facility] immediately available ER MD    Physician(s)  Jimmye Norman and Lucita Lora    Medication changes reported      No    Fall or balance concerns reported     No    Warm-up and Cool-down  Performed as group-led Higher education careers adviser Performed  Yes    VAD Patient?  No      Pain Assessment   Currently in Pain?  No/denies    Multiple Pain Sites  No          Social History   Tobacco Use  Smoking Status Former Smoker  . Packs/day: 2.00  . Years: 35.00  . Pack years: 70.00  . Types: Cigarettes  . Last attempt to quit: 10/30/1985  . Years since quitting: 31.8  Smokeless Tobacco Never Used    Goals Met:  Independence with exercise equipment Exercise tolerated well No report of cardiac concerns or symptoms Strength training completed today  Goals Unmet:  Not Applicable  Comments: Pt able to follow exercise prescription today without complaint.  Will continue to monitor for progression.    Dr. Emily Filbert is Medical Director for [place] and [facility].

## 2017-09-11 DIAGNOSIS — Z79899 Other long term (current) drug therapy: Secondary | ICD-10-CM | POA: Diagnosis not present

## 2017-09-11 DIAGNOSIS — J449 Chronic obstructive pulmonary disease, unspecified: Secondary | ICD-10-CM

## 2017-09-11 DIAGNOSIS — Z87891 Personal history of nicotine dependence: Secondary | ICD-10-CM | POA: Diagnosis not present

## 2017-09-11 DIAGNOSIS — I10 Essential (primary) hypertension: Secondary | ICD-10-CM | POA: Diagnosis not present

## 2017-09-11 DIAGNOSIS — E119 Type 2 diabetes mellitus without complications: Secondary | ICD-10-CM | POA: Diagnosis not present

## 2017-09-11 DIAGNOSIS — Z7984 Long term (current) use of oral hypoglycemic drugs: Secondary | ICD-10-CM | POA: Diagnosis not present

## 2017-09-11 NOTE — Progress Notes (Signed)
Pulmonary Individual Treatment Plan  Patient Details  Name: Cody Bridges MRN: [id number] Date of Birth: [birth year] Referring Provider:    Initial Encounter Date:    Pulmonary Rehab from 03/28/2017 in [facility] Cardiac and Pulmonary Rehab  Date  03/28/17      Visit Diagnosis: Chronic obstructive pulmonary disease, unspecified COPD type ([place])  Patient's Home Medications on Admission:  Current Outpatient Medications:  .  acetaminophen (TYLENOL) 500 MG tablet, Take 1,000 mg by mouth every 6 (six) hours as needed for moderate pain or headache., Disp: , Rfl:  .  budesonide-formoterol (SYMBICORT) 160-4.5 MCG/ACT inhaler, Inhale 2 puffs into the lungs 2 (two) times daily as needed (for shortness of breath)., Disp: , Rfl:  .  calcium carbonate (TUMS - DOSED IN MG ELEMENTAL CALCIUM) 500 MG chewable tablet, Chew 1-2 tablets by mouth 3 (three) times daily as needed (for acid reflux/indigestion.)., Disp: , Rfl:  .  Cholecalciferol (VITAMIN D-3) 1000 units CAPS, Take 2,000 Units by mouth daily. , Disp: , Rfl:  .  Fluticasone-Umeclidin-Vilant (TRELEGY ELLIPTA) 100-62.5-25 MCG/INH AEPB, Inhale 1 puff into the lungs daily., Disp: 60 each, Rfl: 5 .  hydrochlorothiazide (HYDRODIURIL) 25 MG tablet, Take 25 mg by mouth daily., Disp: , Rfl:  .  lisinopril (PRINIVIL,ZESTRIL) 5 MG tablet, Take 5 mg by mouth daily., Disp: , Rfl:  .  metFORMIN (GLUCOPHAGE) 500 MG tablet, Take 500 mg by mouth 2 (two) times daily., Disp: , Rfl:  .  Multiple Vitamin (MULTIVITAMIN WITH MINERALS) TABS tablet, Take 1 tablet by mouth daily., Disp: , Rfl:  .  PROAIR HFA 108 (90 Base) MCG/ACT inhaler, Inhale 2 puffs into the lungs every 6 (six) hours as needed for wheezing or shortness of breath. , Disp: , Rfl:  .  rosuvastatin (CRESTOR) 20 MG tablet, Take 20 mg by mouth daily., Disp: , Rfl:  .  triamcinolone ointment (KENALOG) 0.1 %, Apply 1 application topically 2 (two) times daily as needed (for dry skin/irritation.)., Disp: , Rfl:   Past  Medical History: Past Medical History:  Diagnosis Date  . Arthritis   . Asthma   . COPD (chronic obstructive pulmonary disease) ([place])   . Diabetes mellitus without complication ([place])     7829  . Dyspnea    with exertion  . Edema   . GERD (gastroesophageal reflux disease)   . HOH (hard of hearing)    AIDS  . Hyperlipidemia   . Hypertension   . Wheezing     Tobacco Use: Social History   Tobacco Use  Smoking Status Former Smoker  . Packs/day: 2.00  . Years: 35.00  . Pack years: 70.00  . Types: Cigarettes  . Last attempt to quit: 10/30/1985  . Years since quitting: 31.8  Smokeless Tobacco Never Used    Labs: Recent Review Flowsheet Data    Labs for ITP Cardiac and Pulmonary Rehab 10/24/2016 01/26/2017 05/01/2017   Hemoglobin A1c 6.8(H) 6.5 6.3       Pulmonary Assessment Scores: Pulmonary Assessment Scores    Row Name 03/28/17 1330 07/05/17 1052       ADL UCSD   ADL Phase  Entry  Mid    SOB Score total  76  49    Rest  2  1    Walk  3  3    Stairs  4  4    Bath  2  1    Dress  2  4    Shop  3  4  CAT Score   CAT Score  25  -      mMRC Score   mMRC Score  1  -       Pulmonary Function Assessment: Pulmonary Function Assessment - 03/28/17 1348      Pulmonary Function Tests   FVC%  78 % Test performed 12/15/2016    FEV1%  59 %    FEV1/FVC Ratio  59      Breath   Shortness of Breath  Yes;Fear of Shortness of Breath;Limiting activity       Exercise Target Goals:    Exercise Program Goal: Individual exercise prescription set using results from initial 6 min walk test and THRR while considering  patient's activity barriers and safety.    Exercise Prescription Goal: Initial exercise prescription builds to 30-45 minutes a day of aerobic activity, 2-3 days per week.  Home exercise guidelines will be given to patient during program as part of exercise prescription that the participant will acknowledge.  Activity Barriers & Risk  Stratification: Activity Barriers & Cardiac Risk Stratification - 03/28/17 1236      Activity Barriers & Cardiac Risk Stratification   Activity Barriers  Arthritis;Back Problems;Shortness of Breath;Muscular Weakness Low back pain, arthritis hands and other joints, legs weak, SOB from chronic lung disease       6 Minute Walk: 6 Minute Walk    Row Name 03/28/17 1332         6 Minute Walk   Distance  950 feet     Walk Time  6 minutes     # of Rest Breaks  0     MPH  1.79     METS  1.86     RPE  15     Perceived Dyspnea   2     VO2 Peak  6.5     Symptoms  Yes (comment)     Comments  back pain 7/10     Resting HR  95 bpm     Resting BP  122/68     Resting Oxygen Saturation   96 %     Exercise Oxygen Saturation  during 6 min walk  95 %     Max Ex. HR  132 bpm     Max Ex. BP  132/56     2 Minute Post BP  116/70       Interval HR   1 Minute HR  114     2 Minute HR  120     3 Minute HR  132     4 Minute HR  124     5 Minute HR  121     6 Minute HR  123     2 Minute Post HR  85     Interval Heart Rate?  Yes       Interval Oxygen   Interval Oxygen?  Yes     Baseline Oxygen Saturation %  96 %     1 Minute Oxygen Saturation %  95 %     1 Minute Liters of Oxygen  0 L     2 Minute Oxygen Saturation %  95 %     2 Minute Liters of Oxygen  0 L     3 Minute Oxygen Saturation %  95 %     3 Minute Liters of Oxygen  0 L     4 Minute Oxygen Saturation %  95 %     4 Minute Liters of Oxygen  0 L     5 Minute Oxygen Saturation %  96 %     5 Minute Liters of Oxygen  0 L     6 Minute Oxygen Saturation %  96 %     6 Minute Liters of Oxygen  0 L     2 Minute Post Oxygen Saturation %  97 %     2 Minute Post Liters of Oxygen  0 L       Oxygen Initial Assessment: Oxygen Initial Assessment - 03/28/17 1231      Home Oxygen   Home Oxygen Device  None    Sleep Oxygen Prescription  None    Home Exercise Oxygen Prescription  None    Home at Rest Exercise Oxygen Prescription  None       Initial 6 min Walk   Oxygen Used  None      Program Oxygen Prescription   Program Oxygen Prescription  None      Intervention   Short Term Goals  To learn and understand importance of monitoring SPO2 with pulse oximeter and demonstrate accurate use of the pulse oximeter.;To learn and demonstrate proper pursed lip breathing techniques or other breathing techniques.;To learn and understand importance of maintaining oxygen saturations>88%;To learn and demonstrate proper use of respiratory medications    Long  Term Goals  Verbalizes importance of monitoring SPO2 with pulse oximeter and return demonstration;Exhibits proper breathing techniques, such as pursed lip breathing or other method taught during program session;Demonstrates proper use of MDI's;Compliance with respiratory medication;Maintenance of O2 saturations>88%       Oxygen Re-Evaluation: Oxygen Re-Evaluation    Row Name 04/10/17 1040 04/19/17 1349 05/15/17 1626 05/29/17 1604 07/05/17 1102     Program Oxygen Prescription   Program Oxygen Prescription  -  None  None  None  None     Home Oxygen   Home Oxygen Device  None  None  None  None  None   Sleep Oxygen Prescription  None  None  None  None  None   Home Exercise Oxygen Prescription  None  None  None  None  None   Home at Rest Exercise Oxygen Prescription  None  None  None  None  None     Goals/Expected Outcomes   Short Term Goals  To learn and understand importance of monitoring SPO2 with pulse oximeter and demonstrate accurate use of the pulse oximeter.;To learn and demonstrate proper pursed lip breathing techniques or other breathing techniques.;To learn and understand importance of maintaining oxygen saturations>88%;To learn and demonstrate proper use of respiratory medications  To learn and understand importance of maintaining oxygen saturations>88%;To learn and demonstrate proper use of respiratory medications;To learn and demonstrate proper pursed lip breathing techniques  or other breathing techniques.;To learn and understand importance of monitoring SPO2 with pulse oximeter and demonstrate accurate use of the pulse oximeter.  To learn and understand importance of maintaining oxygen saturations>88%;To learn and demonstrate proper pursed lip breathing techniques or other breathing techniques.;To learn and understand importance of monitoring SPO2 with pulse oximeter and demonstrate accurate use of the pulse oximeter.;To learn and demonstrate proper use of respiratory medications  To learn and understand importance of maintaining oxygen saturations>88%;To learn and demonstrate proper pursed lip breathing techniques or other breathing techniques.;To learn and understand importance of monitoring SPO2 with pulse oximeter and demonstrate accurate use of the pulse oximeter.;To learn and demonstrate proper use of respiratory medications  To learn and understand importance of maintaining oxygen saturations>88%;To learn and  demonstrate proper pursed lip breathing techniques or other breathing techniques.;To learn and understand importance of monitoring SPO2 with pulse oximeter and demonstrate accurate use of the pulse oximeter.;To learn and demonstrate proper use of respiratory medications   Long  Term Goals  Maintenance of O2 saturations>88%;Compliance with respiratory medication;Verbalizes importance of monitoring SPO2 with pulse oximeter and return demonstration;Exhibits proper breathing techniques, such as pursed lip breathing or other method taught during program session;Demonstrates proper use of MDI's  Verbalizes importance of monitoring SPO2 with pulse oximeter and return demonstration;Exhibits proper breathing techniques, such as pursed lip breathing or other method taught during program session;Demonstrates proper use of MDI's;Compliance with respiratory medication;Maintenance of O2 saturations>88%  Maintenance of O2 saturations>88%;Compliance with respiratory  medication;Demonstrates proper use of MDI's;Exhibits proper breathing techniques, such as pursed lip breathing or other method taught during program session;Verbalizes importance of monitoring SPO2 with pulse oximeter and return demonstration  Maintenance of O2 saturations>88%;Compliance with respiratory medication;Demonstrates proper use of MDI's;Exhibits proper breathing techniques, such as pursed lip breathing or other method taught during program session;Verbalizes importance of monitoring SPO2 with pulse oximeter and return demonstration  Maintenance of O2 saturations>88%;Compliance with respiratory medication;Demonstrates proper use of MDI's;Exhibits proper breathing techniques, such as pursed lip breathing or other method taught during program session;Verbalizes importance of monitoring SPO2 with pulse oximeter and return demonstration   Comments  Reviewed PLB technique with pt.  Talked about how it work and it's important to maintaining his exercise saturations.    Informed Cody Bridges about the effects of COPD, He does not have a pulse oximeter at home to check his oxygen. Informed him how to obtain one and to make sure his oxygen is above 88 percent. He needs alot of imrpovement on PLB techniques.  Cody Bridges states that he checks his blood  pressure and his heart rate at home regularly. He does not show an interest in purchasing a pulse oximeter to check his oxygen saturation. He does not want to use a spacer for his inhalers either. His oxygen levels have been above 88 percent while exercising.  His PLB has got better but still could use practice.  Cody Bridges states that he has been a little more short of breath this past week. He states he gets sore from exercising and usually wont attend until he is not sore. Informed him that he should come more frequently to help with his soreness and his shortness of breath.  Cody Bridges has been out on vacation and has returned to [employer] today. He has been taking his medications  as prescribed. Informed Cody Bridges to try to attend [facility] more frequently.   Goals/Expected Outcomes  Short: Become more profiecient at using PLB.   Long: Become independent at using PLB.  Short:obtatin a pulse oximeter and work on PLB. Long: Check and maintain an oxygen saturation greater than 88 precent. Be proficient with PLB  Short: convince Cody Bridges to use a spacer with his medications. Long: Independently use a spacer with his medications  Short: attened [facility] regulary. Long: maintain an exercise regimine to help with shortness of breath.  Short: attened [facility] regulary. Long: maintain an exercise regimine to help with shortness of breath.   Cody Bridges Name 08/18/17 1027             Program Oxygen Prescription   Program Oxygen Prescription  None         Home Oxygen   Home Oxygen Device  None       Sleep Oxygen Prescription  None  Home Exercise Oxygen Prescription  None       Home at Rest Exercise Oxygen Prescription  None         Goals/Expected Outcomes   Short Term Goals  To learn and understand importance of maintaining oxygen saturations>88%;To learn and demonstrate proper pursed lip breathing techniques or other breathing techniques.;To learn and understand importance of monitoring SPO2 with pulse oximeter and demonstrate accurate use of the pulse oximeter.;To learn and demonstrate proper use of respiratory medications       Long  Term Goals  Maintenance of O2 saturations>88%;Compliance with respiratory medication;Demonstrates proper use of MDI's;Exhibits proper breathing techniques, such as pursed lip breathing or other method taught during program session;Verbalizes importance of monitoring SPO2 with pulse oximeter and return demonstration       Comments  Cody Bridges has been short of breath at home. He has been doing very little home exercise. He states he is taking his inhalers and is taking them routinely. Cody Bridges needs to attened regularly to help with his shortness of breath. He is  meeting with Dr. Ashby Dawes next month to do a PFT.       Goals/Expected Outcomes  Short: attened [facility] regulary. Long: maintain an exercise regimine to help with shortness of breath.          Oxygen Discharge (Final Oxygen Re-Evaluation): Oxygen Re-Evaluation - 08/18/17 1027      Program Oxygen Prescription   Program Oxygen Prescription  None      Home Oxygen   Home Oxygen Device  None    Sleep Oxygen Prescription  None    Home Exercise Oxygen Prescription  None    Home at Rest Exercise Oxygen Prescription  None      Goals/Expected Outcomes   Short Term Goals  To learn and understand importance of maintaining oxygen saturations>88%;To learn and demonstrate proper pursed lip breathing techniques or other breathing techniques.;To learn and understand importance of monitoring SPO2 with pulse oximeter and demonstrate accurate use of the pulse oximeter.;To learn and demonstrate proper use of respiratory medications    Long  Term Goals  Maintenance of O2 saturations>88%;Compliance with respiratory medication;Demonstrates proper use of MDI's;Exhibits proper breathing techniques, such as pursed lip breathing or other method taught during program session;Verbalizes importance of monitoring SPO2 with pulse oximeter and return demonstration    Comments  Cody Bridges has been short of breath at home. He has been doing very little home exercise. He states he is taking his inhalers and is taking them routinely. Cody Bridges needs to attened regularly to help with his shortness of breath. He is meeting with Dr. Ashby Dawes next month to do a PFT.    Goals/Expected Outcomes  Short: attened [facility] regulary. Long: maintain an exercise regimine to help with shortness of breath.       Initial Exercise Prescription: Initial Exercise Prescription - 03/28/17 1300      Date of Initial Exercise RX and Referring Provider   Date  03/28/17      Treadmill   MPH  1.5    Grade  0    Minutes  15    METs  2.15       Recumbant Bike   Level  1    RPM  60    Watts  5    Minutes  15    METs  1.8      NuStep   Level  2    SPM  80    Minutes  15    METs  2      Arm Ergometer   Level  1    RPM  50    Minutes  15    METs  1.6      Prescription Details   Frequency (times per week)  3    Duration  Progress to 45 minutes of aerobic exercise without signs/symptoms of physical distress      Intensity   THRR 40-80% of Max Heartrate  112-129    Ratings of Perceived Exertion  11-15    Perceived Dyspnea  0-4      Progression   Progression  Continue to progress workloads to maintain intensity without signs/symptoms of physical distress.      Resistance Training   Training Prescription  Yes    Weight  3    Reps  10-15       Perform Capillary Blood Glucose checks as needed.  Exercise Prescription Changes: Exercise Prescription Changes    Row Name 03/28/17 1300 04/12/17 1500 04/26/17 1500 05/03/17 1400 05/10/17 1000     Response to Exercise   Blood Pressure (Admit)  122/68  132/64  -  -  124/64   Blood Pressure (Exercise)  132/56  -  -  -  -   Blood Pressure (Exit)  116/70  128/68  132/84  -  -   Heart Rate (Admit)  95 bpm  95 bpm  -  -  98 bpm   Heart Rate (Exercise)  132 bpm  96 bpm  121 bpm  -  123 bpm   Heart Rate (Exit)  85 bpm  56 bpm  99 bpm  -  -   Oxygen Saturation (Admit)  96 %  95 %  97 %  -  94 %   Oxygen Saturation (Exercise)  95 %  95 %  95 %  -  95 %   Oxygen Saturation (Exit)  97 %  97 %  95 %  -  -   Rating of Perceived Exertion (Exercise)  '15  15  15  ' -  14   Perceived Dyspnea (Exercise)  '2  3  3  ' -  3   Symptoms  back pain 7/10  none  none  -  none   Duration  Progress to 45 minutes of aerobic exercise without signs/symptoms of physical distress  Progress to 45 minutes of aerobic exercise without signs/symptoms of physical distress  Progress to 45 minutes of aerobic exercise without signs/symptoms of physical distress  -  Continue with 45 min of aerobic exercise without  signs/symptoms of physical distress.   Intensity  -  -  -  -  THRR unchanged     Progression   Progression  -  Continue to progress workloads to maintain intensity without signs/symptoms of physical distress.  Continue to progress workloads to maintain intensity without signs/symptoms of physical distress.  Continue to progress workloads to maintain intensity without signs/symptoms of physical distress.  Continue to progress workloads to maintain intensity without signs/symptoms of physical distress.   Average METs  -  1.'9  2  2  2     ' Resistance Training   Training Prescription  -  Yes  Yes  Yes  Yes   Weight  -  '3  3  3  3   ' Reps  -  10-15  10-15  10-15  10-15     Interval Training   Interval Training  -  No  No  No  No  Treadmill   MPH  -  -  1.5  1.5  1.5   Grade  -  -  0  0  0   Minutes  -  -  '15  15  15   ' METs  -  -  2.15  2.15  2.15     NuStep   Level  -  '2  2  2  2   ' SPM  -  72  72  72  69   Minutes  -  '15  15  15  15   ' METs  -  '2  2  2  2     ' Arm Ergometer   Level  -  '1  1  1  ' 1.2   RPM  -  43  41  41  -   Minutes  -  '15  15  15  15   ' METs  -  1.'8  2  2  ' -     Home Exercise Plan   Plans to continue exercise at  -  -  -  Home (comment) at his appartment complex  -   Frequency  -  -  -  Add 1 additional day to program exercise sessions.  -   Initial Home Exercises Provided  -  -  -  05/03/17  -   [place] Name 05/24/17 1400 06/07/17 1200 06/07/17 1300 06/21/17 1000 07/05/17 1200     Response to Exercise   Blood Pressure (Admit)  126/62  134/64  -  132/70  114/60   Blood Pressure (Exit)  124/76  130/68  -  118/70  108/60   Heart Rate (Admit)  93 bpm  103 bpm  -  -  91 bpm   Heart Rate (Exercise)  104 bpm  126 bpm  -  123 bpm  112 bpm   Heart Rate (Exit)  81 bpm  -  -  78 bpm  93 bpm   Oxygen Saturation (Admit)  99 %  97 %  -  -  95 %   Oxygen Saturation (Exercise)  97 %  96 %  -  96 %  96 %   Oxygen Saturation (Exit)  95 %  -  -  94 %  96 %   Rating of Perceived  Exertion (Exercise)  13  12  -  13  13   Perceived Dyspnea (Exercise)  2  2  -  3  3   Symptoms  none  none  -  none  none   Duration  Continue with 45 min of aerobic exercise without signs/symptoms of physical distress.  Continue with 45 min of aerobic exercise without signs/symptoms of physical distress.  -  Continue with 45 min of aerobic exercise without signs/symptoms of physical distress.  Continue with 45 min of aerobic exercise without signs/symptoms of physical distress.   Intensity  THRR unchanged  THRR unchanged  -  THRR unchanged  THRR unchanged     Progression   Progression  Continue to progress workloads to maintain intensity without signs/symptoms of physical distress.  Continue to progress workloads to maintain intensity without signs/symptoms of physical distress.  -  Continue to progress workloads to maintain intensity without signs/symptoms of physical distress.  Continue to progress workloads to maintain intensity without signs/symptoms of physical distress.   Average METs  2.2  2.6  -  2.5  2.2     Resistance Training   Training Prescription  Yes  Yes  -  Yes  Yes   Weight  3  3 lb  -  4 lb  4 lb   Reps  10-15  10-15  -  10-15  10-15     Interval Training   Interval Training  No  No  -  -  -     Treadmill   MPH  1.5  1.5  -  1.8  1.7   Grade  0  0  -  0  0   Minutes  15  15  -  15  15   METs  2.15  2.15  -  2.38  2.3     NuStep   Level  2  4  -  4  -   SPM  79  95  -  98  -   Minutes  15  15  -  15  -   METs  2  3.1  -  3  -     Arm Ergometer   Level  -  -  -  2  2   Minutes  -  -  -  15  15   METs  -  -  -  2.2  2.1     Home Exercise Plan   Plans to continue exercise at  -  -  Home (comment) at his appartment complex  Home (comment) at his appartment complex  -   Frequency  -  -  Add 1 additional day to program exercise sessions.  Add 1 additional day to program exercise sessions.  -   Initial Home Exercises Provided  -  -  05/03/17  05/03/17  -   [place] Name  08/16/17 1500 08/30/17 1400           Response to Exercise   Blood Pressure (Admit)  126/64  140/72      Blood Pressure (Exit)  124/74  126/64      Heart Rate (Admit)  87 bpm  103 bpm      Heart Rate (Exercise)  116 bpm  125 bpm      Heart Rate (Exit)  94 bpm  103 bpm      Oxygen Saturation (Admit)  95 %  94 %      Oxygen Saturation (Exercise)  96 %  97 %      Oxygen Saturation (Exit)  95 %  97 %      Rating of Perceived Exertion (Exercise)  14  13      Perceived Dyspnea (Exercise)  2.5  2      Symptoms  none  none      Duration  Continue with 45 min of aerobic exercise without signs/symptoms of physical distress.  Continue with 45 min of aerobic exercise without signs/symptoms of physical distress.      Intensity  THRR unchanged  THRR unchanged        Progression   Progression  Continue to progress workloads to maintain intensity without signs/symptoms of physical distress.  Continue to progress workloads to maintain intensity without signs/symptoms of physical distress.      Average METs  2.2  2.3        Resistance Training   Training Prescription  Yes  -      Weight  4 lb  -      Reps  10-15  -        Interval Training   Interval Training  No  No        Treadmill   MPH  1.6  1.4      Grade  0  0      Minutes  15  15      METs  2.23  2.07        NuStep   Level  4  -      SPM  98  -      Minutes  15  -      METs  2.1  -        Arm Ergometer   Level  -  2.6      Minutes  -  15      METs  -  2.1        Home Exercise Plan   Plans to continue exercise at  Home (comment) at his appartment complex  Home (comment) at his appartment complex      Frequency  Add 1 additional day to program exercise sessions.  Add 1 additional day to program exercise sessions.      Initial Home Exercises Provided  05/03/17  05/03/17         Exercise Comments: Exercise Comments    Row Name 04/10/17 1046 05/03/17 1453         Exercise Comments  First full day of exercise!  Patient  was oriented to gym and equipment including functions, settings, policies, and procedures.  Patient's individual exercise prescription and treatment plan were reviewed.  All starting workloads were established based on the results of the 6 minute walk test done at initial orientation visit.  The plan for exercise progression was also introduced and progression will be customized based on patient's performance and goals.  Reviewed home exercise with pt today.  Pt plans to use the fitness center at his appartment for exercise.  Reviewed THR, pulse, RPE, sign and symptoms, NTG use, and when to call 911 or MD.  Also discussed weather considerations and indoor options.  Pt voiced understanding.         Exercise Goals and Review: Exercise Goals    Row Name 03/28/17 1336             Exercise Goals   Increase Physical Activity  Yes       Intervention  Provide advice, education, support and counseling about physical activity/exercise needs.;Develop an individualized exercise prescription for aerobic and resistive training based on initial evaluation findings, risk stratification, comorbidities and participant's personal goals.       Expected Outcomes  Achievement of increased cardiorespiratory fitness and enhanced flexibility, muscular endurance and strength shown through measurements of functional capacity and personal statement of participant.       Increase Strength and Stamina  Yes       Intervention  Provide advice, education, support and counseling about physical activity/exercise needs.;Develop an individualized exercise prescription for aerobic and resistive training based on initial evaluation findings, risk stratification, comorbidities and participant's personal goals.       Expected Outcomes  Achievement of increased cardiorespiratory fitness and enhanced flexibility, muscular endurance and strength shown through measurements of functional capacity and personal statement of participant.        Able to understand and use rate of perceived exertion (RPE) scale  Yes       Intervention  Provide education and explanation on how to use RPE scale       Expected Outcomes  Short Term: Able to use RPE daily in  rehab to express subjective intensity level;Long Term:  Able to use RPE to guide intensity level when exercising independently       Able to understand and use Dyspnea scale  Yes       Intervention  Provide education and explanation on how to use Dyspnea scale       Expected Outcomes  Short Term: Able to use Dyspnea scale daily in rehab to express subjective sense of shortness of breath during exertion;Long Term: Able to use Dyspnea scale to guide intensity level when exercising independently       Knowledge and understanding of Target Heart Rate Range (THRR)  Yes       Intervention  Provide education and explanation of THRR including how the numbers were predicted and where they are located for reference       Expected Outcomes  Short Term: Able to state/look up THRR;Long Term: Able to use THRR to govern intensity when exercising independently;Short Term: Able to use daily as guideline for intensity in rehab       Able to check pulse independently  Yes       Intervention  Provide education and demonstration on how to check pulse in carotid and radial arteries.;Review the importance of being able to check your own pulse for safety during independent exercise       Expected Outcomes  Short Term: Able to explain why pulse checking is important during independent exercise;Long Term: Able to check pulse independently and accurately       Understanding of Exercise Prescription  Yes       Intervention  Provide education, explanation, and written materials on patient's individual exercise prescription       Expected Outcomes  Short Term: Able to explain program exercise prescription;Long Term: Able to explain home exercise prescription to exercise independently          Exercise Goals Re-Evaluation  : Exercise Goals Re-Evaluation    Row Name 04/12/17 1504 04/26/17 1544 05/03/17 1454 05/10/17 1058 05/24/17 1454     Exercise Goal Re-Evaluation   Exercise Goals Review  Increase Physical Activity;Increase Strength and Stamina  Increase Physical Activity;Increase Strength and Stamina  Increase Physical Activity;Increase Strength and Stamina;Able to understand and use rate of perceived exertion (RPE) scale;Able to check pulse independently;Knowledge and understanding of Target Heart Rate Range (THRR);Understanding of Exercise Prescription;Able to understand and use Dyspnea scale  Increase Physical Activity;Increase Strength and Stamina;Understanding of Exercise Prescription  Increase Physical Activity;Increase Strength and Stamina   Comments  Cody Bridges has tolerated exercise well.  He had some muscle soreness after his first session.  Cody Bridges has tolerated exercise well.  He needs to attend 2-3 days per week to improve.  Staff will remind him to attend regularly.  Reviewed home exercise with pt today.  Pt plans to use the fitness center at his appartment for exercise.  Reviewed THR, pulse, RPE, sign and symptoms, NTG use, and when to call 911 or MD.  Also discussed weather considerations and indoor options.  Pt voiced understanding.  Cody Bridges has sporadic attendance at class.  He would see better results with regular attendance 2-3 times per week.  Staff will remind Cody Bridges of the benefits of regular exercise.  Cody Bridges has been out of town.  he would benefit from more frequent attendance.  Staff reviewed the benefits of exercising 3 times per week.   Expected Outcomes  Short - Cody Schlarb will continue to attend regularly.  Long - Cody Lunt will progress with  exercise.  Short - Cody Bridges will attend 2-3 days per week.  Long - Cody Bridges will see imporvement in overall MET and fitness level.  Short: add 1 day of exercise to his daily routine. Long: Increase number of days for exercise.  Short - Cody Bridges will attend at leat 2 times per week.  Long - Cody Bridges  will progress more with regular attendance.  Short - Cody Bridges will attend at least 2 x per week. Long - Cody Bridges will progress more with exercise.   [place] Name 06/07/17 1302 06/21/17 1039 07/05/17 1237 08/16/17 1548 08/30/17 1459     Exercise Goal Re-Evaluation   Exercise Goals Review  Increase Physical Activity;Increase Strength and Stamina  Increase Physical Activity;Increase Strength and Stamina;Able to understand and use rate of perceived exertion (RPE) scale;Able to understand and use Dyspnea scale  Increase Physical Activity;Increase Strength and Stamina  Increase Physical Activity;Increase Strength and Stamina;Able to understand and use Dyspnea scale;Able to understand and use rate of perceived exertion (RPE) scale  Increase Physical Activity;Increase Strength and Stamina;Able to understand and use rate of perceived exertion (RPE) scale   Comments  Cody Bridges is tolerating exercise well.  Staff will continue to monitor.  Cody Bridges is tolerating exercise well.  He would see better progresion with regular attendance.  He has been travelling frequently.  Today was pt first day back after a vacation of 3 weeks.  He reduced speed on TM for today.    Cody Bridges travels frequently and has not attended class regularly.  He would benefit from more consistent attendance or exercising on his own while not in class.  Cody Bridges has attended 2 days per week the last two weeks.  Regular attendance will help him improve overall MET level.   Expected Outcomes  Short - Cody Bridges will exercise regularly even when travelling.  Long - Cody Bridges will maintain exercise on his own.  Short - Cody Bridges will attend LW at least twice per week.  Long - Cody Bridges will exercise regularly.  Short - Cody Bridges will build to former levels Long - Cody Bridges will continue to progress MET level  Short - Cody Bridges will attend more frequently and exercise on his own while away [place] will maintain exercise on his own  Short - Cody Bridges will complete rehab Long - Cody Bridges will maintain exercise on his own      Discharge  Exercise Prescription (Final Exercise Prescription Changes): Exercise Prescription Changes - 08/30/17 1400      Response to Exercise   Blood Pressure (Admit)  140/72    Blood Pressure (Exit)  126/64    Heart Rate (Admit)  103 bpm    Heart Rate (Exercise)  125 bpm    Heart Rate (Exit)  103 bpm    Oxygen Saturation (Admit)  94 %    Oxygen Saturation (Exercise)  97 %    Oxygen Saturation (Exit)  97 %    Rating of Perceived Exertion (Exercise)  13    Perceived Dyspnea (Exercise)  2    Symptoms  none    Duration  Continue with 45 min of aerobic exercise without signs/symptoms of physical distress.    Intensity  THRR unchanged      Progression   Progression  Continue to progress workloads to maintain intensity without signs/symptoms of physical distress.    Average METs  2.3      Interval Training   Interval Training  No      Treadmill   MPH  1.4    Grade  0  Minutes  15    METs  2.07      Arm Ergometer   Level  2.6    Minutes  15    METs  2.1      Home Exercise Plan   Plans to continue exercise at  Home (comment) at his appartment complex    Frequency  Add 1 additional day to program exercise sessions.    Initial Home Exercises Provided  05/03/17       Nutrition:  Target Goals: Understanding of nutrition guidelines, daily intake of sodium <1563m, cholesterol <2063m calories 30% from fat and 7% or less from saturated fats, daily to have 5 or more servings of fruits and vegetables.  Biometrics: Pre Biometrics - 03/28/17 1330      Pre Biometrics   Height  5' 10.5" (1.791 m)    Weight  211 lb 4.8 oz (95.8 kg)    Waist Circumference  46 inches    Hip Circumference  44.5 inches    Waist to Hip Ratio  1.03 %    BMI (Calculated)  29.88        Nutrition Therapy Plan and Nutrition Goals: Nutrition Therapy & Goals - 05/29/17 1608      Nutrition Therapy   RD appointment deferred  Yes      Personal Nutrition Goals   Comments  patient declines to see the dietician        Nutrition Assessments:   Nutrition Goals Re-Evaluation: Nutrition Goals Re-Evaluation    Cody Bridges 04/19/17 1403 05/15/17 1631 05/29/17 1608 08/18/17 1031       Goals   Current Weight  215 lb (97.5 kg)  217 lb (98.4 kg)  215 lb (97.5 kg)  215 lb (97.5 kg)    Nutrition Goal  Patient declined to see the nutritionist  Lose weight and increase strength and stamina.  Eat healthier and lose weight  Eat healthier and lose weight    Comment  Roberts weight will continued to be monitored and inform him that he can meet with a dietician is he wants to.  Cody Bridges not lost weight but has gained a few pounds due to vaPocaHe does not want to meet with the dietician.  Cody Bridges lost 2 pounds since his last weigh in. He has been vacationing and is starting to not eat as much. He states that he is pickey with what he ears at times.  Cody Bridges at his weight when he started. He has been going on vacation and not watching his diet like he should. RoEtheredgeirlfriend has been making more healthier meals and trying to cut back on sodium.    Expected Outcome  Short: lose weight using exercise. Long: Maintain weightloss with exercise at home.  Short: Lose a few pounds before his next vacation. Long; maintain a nutrition and weightloss program.  Short: eat a heart healthy diet. Long: Adhere to a diet plan.  Short: eat a healthier to lose weight. Long: maintain a healthy diet to maintain weight.       Nutrition Goals Discharge (Final Nutrition Goals Re-Evaluation): Nutrition Goals Re-Evaluation - 08/18/17 1031      Goals   Current Weight  215 lb (97.5 kg)    Nutrition Goal  Eat healthier and lose weight    Comment  RoLoris at his weight when he started. He has been going on vacation and not watching his diet like he should. RoDombekirlfriend has been making more healthier meals and trying  to cut back on sodium.    Expected Outcome  Short: eat a healthier to lose weight. Long: maintain a healthy diet  to maintain weight.       Psychosocial: Target Goals: Acknowledge presence or absence of significant depression and/or stress, maximize coping skills, provide positive support system. Participant is able to verbalize types and ability to use techniques and skills needed for reducing stress and depression.   Initial Review & Psychosocial Screening: Initial Psych Review & Screening - 03/28/17 1239      Initial Review   Current issues with  None Identified HAd depression when told he possible had cancer.  CAncer was ruled out with a biospy and depression is gone.       Family Dynamics   Good Support System?  Yes Cody Bridges, his significant other      Barriers   Psychosocial barriers to participate in program  There are no identifiable barriers or psychosocial needs.;The patient should benefit from training in stress management and relaxation.      Screening Interventions   Interventions  Encouraged to exercise;Provide feedback about the scores to participant;To provide support and resources with identified psychosocial needs       Quality of Life Scores:  Scores of 19 and below usually indicate a poorer quality of life in these areas.  A difference of  2-3 points is a clinically meaningful difference.  A difference of 2-3 points in the total score of the Quality of Life Index has been associated with significant improvement in overall quality of life, self-image, physical symptoms, and general health in studies assessing change in quality of life.  PHQ-9: Recent Review Flowsheet Data    Depression screen [facility] 2/9 07/05/2017 03/28/2017 01/26/2017   Decreased Interest 2 0 0   Down, Depressed, Hopeless 0 0 0   PHQ - 2 Score 2 0 0   Altered sleeping 0 0 -   Tired, decreased energy 2 2 -   Change in appetite 1 2 -   Feeling bad or failure about yourself  0 0 -   Trouble concentrating 2 0 -   Moving slowly or fidgety/restless 0 0 -   Suicidal thoughts 0 0 -   PHQ-9 Score 7 4 -   Difficult  doing work/chores Not difficult at all Somewhat difficult -     Interpretation of Total Score  Total Score Depression Severity:  1-4 = Minimal depression, 5-9 = Mild depression, 10-14 = Moderate depression, 15-19 = Moderately severe depression, 20-27 = Severe depression   Psychosocial Evaluation and Intervention: Psychosocial Evaluation - 04/10/17 1100      Psychosocial Evaluation & Interventions   Interventions  Encouraged to exercise with the program and follow exercise prescription;Stress management education    Comments  Counselor met with Cody. [place] Cody Bridges) today for initial psychosocial evaluation.  He is an 84 year old who loves to travel.  He has some health issues with Asthma and Emphysema that impact that somewhat.  Cody Bridges has a strong support system with a significant other of (6) years; and daughter who lives close by; a sister-in-law locally; and active involvement in his local church; [employer] and a 73 year [employer].  Cody Bridges reports sleeping well and having a good appetite.  He denies a history of depression or anxiety or any current symptoms.  Cody Bridges states he is typically in a positive mood and other than his health issues - he has minimal stress in his life.  Cody Bridges has goals to improve  his breathing and learn better ways to manage his illness while in this program.  He will be followed by staff.      Expected Outcomes  Cody Bridges will benefit from consistent exercise to achieve his stated goals.  The educational and psychoeducational components of this program will help Cody Bridges learn to manage and cope with his illnesses better.         Psychosocial Re-Evaluation: Psychosocial Re-Evaluation    [place] Name 04/19/17 1409 05/15/17 1635 05/29/17 1626 08/18/17 1036       Psychosocial Re-Evaluation   Current issues with  None Identified;Current Stress Concerns  None Identified;Current Stress Concerns  Current Stress Concerns  Current Stress Concerns    Comments  Cody Bridges states that his home life is great. He  wants to be able to breath better and feels like the program is helping him to do so. He said he eats, sleeps, watches TV and says his relationship is great. He is traveling to [place] next month and the [place] in November.  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 He states his relationship with his girlfriend is going very well and they have been togehter for 6 years. His shortness of breath and his chronic illness seems to be his only stressor.  [place] has felt more short of breath this past week. He states his home life is great. He and his girlfriend do everything together. He states that his daughter likes his girlfriend and lights up when he talked about it.  Cody Bridges has to get eye surgery on January 22nd. He will miss one day of class. He is ready to attend [facility] more regularly.    Expected Outcomes  Short: exercise to improve shortness breath to minimize stress. Long: Maintain a stress free environment at home and with his breathing.  Short: attend [facility] regularly to improve stress. Long: Maintain a workout regimine to minimize stress   Short: continue rehab. Long: continue with a positive outlook post [facility].  Short: return to [place] after eye surgery. Long: maintain his eyes post surgery.    Interventions  Encouraged to attend Pulmonary Rehabilitation for the exercise;Stress management education  Encouraged to attend Pulmonary Rehabilitation for the exercise  Encouraged to attend Pulmonary Rehabilitation for the exercise  Encouraged to attend Pulmonary Rehabilitation for the exercise    Continue Psychosocial Services   Follow up required by staff  Follow up required by staff  Follow up required by staff  Follow up required by staff       Psychosocial Discharge (Final Psychosocial Re-Evaluation): Psychosocial Re-Evaluation - 08/18/17 1036      Psychosocial Re-Evaluation   Current issues with  Current Stress Concerns    Comments  Cody Bridges  has to get eye surgery on January 22nd. He will miss one day of class. He is ready to attend [facility] more regularly.    Expected Outcomes  Short: return to [place] after eye surgery. Long: maintain his eyes post surgery.    Interventions  Encouraged to attend Pulmonary Rehabilitation for the exercise    Continue Psychosocial Services   Follow up required by staff       Education: Education Goals: Education classes will be provided on a weekly basis, covering required topics. Participant will state understanding/return demonstration of topics presented.  Learning Barriers/Preferences: Learning Barriers/Preferences - 03/28/17 1320      Learning Barriers/Preferences   Learning Barriers  None    Learning Preferences  None  Education Topics:  Initial Evaluation Education: - Verbal, written and demonstration of respiratory meds, oximetry and breathing techniques. Instruction on use of nebulizers and MDIs and importance of monitoring MDI activations.   Pulmonary Rehab from 09/04/2017 in [facility] Cardiac and Pulmonary Rehab  Date  03/28/17  Educator  Sb  Instruction Review Code  1- Verbalizes Understanding      General Nutrition Guidelines/Fats and Fiber: -Group instruction provided by verbal, written material, models and posters to present the general guidelines for heart healthy nutrition. Gives an explanation and review of dietary fats and fiber.   Pulmonary Rehab from 09/04/2017 in [facility] Cardiac and Pulmonary Rehab  Date  08/28/17  Educator  CR  Instruction Review Code  1- Verbalizes Understanding      Controlling Sodium/Reading Food Labels: -Group verbal and written material supporting the discussion of sodium use in heart healthy nutrition. Review and explanation with models, verbal and written materials for utilization of the food label.   Pulmonary Rehab from 09/04/2017 in [facility] Cardiac and Pulmonary Rehab  Date  09/04/17  Educator  CR  Instruction Review Code  1- Verbalizes  Understanding      Exercise Physiology & General Exercise Guidelines: - Group verbal and written instruction with models to review the exercise physiology of the cardiovascular system and associated critical values. Provides general exercise guidelines with specific guidelines to those with heart or lung disease.    Aerobic Exercise & Resistance Training: - Gives group verbal and written instruction on the various components of exercise. Focuses on aerobic and resistive training programs and the benefits of this training and how to safely progress through these programs.   Pulmonary Rehab from 09/04/2017 in [facility] Cardiac and Pulmonary Rehab  Date  08/18/17  Educator  AS  Instruction Review Code  1- Verbalizes Understanding      Flexibility, Balance, Mind/Body Relaxation: Provides group verbal/written instruction on the benefits of flexibility and balance training, including mind/body exercise modes such as yoga, pilates and tai chi.  Demonstration and skill practice provided.   Stress and Anxiety: - Provides group verbal and written instruction about the health risks of elevated stress and causes of high stress.  Discuss the correlation between heart/lung disease and anxiety and treatment options. Review healthy ways to manage with stress and anxiety.   Pulmonary Rehab from 09/04/2017 in [facility] Cardiac and Pulmonary Rehab  Date  04/19/17  Educator  [facility]  Instruction Review Code  1- Verbalizes Understanding      Depression: - Provides group verbal and written instruction on the correlation between heart/lung disease and depressed mood, treatment options, and the stigmas associated with seeking treatment.   Exercise & Equipment Safety: - Individual verbal instruction and demonstration of equipment use and safety with use of the equipment.   Infection Prevention: - Provides verbal and written material to individual with discussion of infection control including proper hand washing and  proper equipment cleaning during exercise session.   Pulmonary Rehab from 09/04/2017 in [facility] Cardiac and Pulmonary Rehab  Date  03/28/17  Educator  Sb  Instruction Review Code  1- Verbalizes Understanding      Falls Prevention: - Provides verbal and written material to individual with discussion of falls prevention and safety.   Pulmonary Rehab from 09/04/2017 in [facility] Cardiac and Pulmonary Rehab  Date  03/28/17  Educator  SB  Instruction Review Code  1- Verbalizes Understanding      Diabetes: - Individual verbal and written instruction to review signs/symptoms of diabetes, desired ranges  of glucose level fasting, after meals and with exercise. Advice that pre and post exercise glucose checks will be done for 3 sessions at entry of program.   Pulmonary Rehab from 09/04/2017 in [facility] Cardiac and Pulmonary Rehab  Date  03/28/17  Educator  Sb  Instruction Review Code  1- Verbalizes Understanding      Chronic Lung Diseases: - Group verbal and written instruction to review updates, respiratory medications, advancements in procedures and treatments. Discuss use of supplemental oxygen including available portable oxygen systems, continuous and intermittent Bridges rates, concentrators, personal use and safety guidelines. Review proper use of inhaler and spacers. Provide informative websites for self-education.    Pulmonary Rehab from 09/04/2017 in [facility] Cardiac and Pulmonary Rehab  Date  05/03/17  Educator  [facility]  Instruction Review Code  1- Verbalizes Understanding      Energy Conservation: - Provide group verbal and written instruction for methods to conserve energy, plan and organize activities. Instruct on pacing techniques, use of adaptive equipment and posture/positioning to relieve shortness of breath.   Pulmonary Rehab from 09/04/2017 in [facility] Cardiac and Pulmonary Rehab  Date  04/12/17  Educator  [facility]  Instruction Review Code  1- Verbalizes Understanding      Triggers and Exacerbations: -  Group verbal and written instruction to review types of environmental triggers and ways to prevent exacerbations. Discuss weather changes, air quality and the benefits of nasal washing. Review warning signs and symptoms to help prevent infections. Discuss techniques for effective airway clearance, coughing, and vibrations.   Pulmonary Rehab from 09/04/2017 in [facility] Cardiac and Pulmonary Rehab  Date  08/16/17  Educator  [facility]  Instruction Review Code  1- Verbalizes Understanding      AED/CPR: - Group verbal and written instruction with the use of models to demonstrate the basic use of the AED with the basic ABC's of resuscitation.   Anatomy and Physiology of the Lungs: - Group verbal and written instruction with the use of models to provide basic lung anatomy and physiology related to function, structure and complications of lung disease.   Pulmonary Rehab from 09/04/2017 in [facility] Cardiac and Pulmonary Rehab  Date  07/05/17  Educator  [facility]  Instruction Review Code  1- Verbalizes Understanding      Anatomy & Physiology of the Heart: - Group verbal and written instruction and models provide basic cardiac anatomy and physiology, with the coronary electrical and arterial systems. Review of Valvular disease and Heart Failure   Pulmonary Rehab from 09/04/2017 in [facility]  Date  08/30/17  Educator  [facility]  Instruction Review Code  1- Verbalizes Understanding      Cardiac Medications: - Group verbal and written instruction to review commonly prescribed medications for heart disease. Reviews the medication, class of the drug, and side effects.   Know Your Numbers and Risk Factors: -Group verbal and written instruction about important numbers in your health.  Discussion of what are risk factors and how they play a role in the disease process.  Review of Cholesterol, Blood Pressure, Diabetes, and BMI and the role they play in your overall health.   Sleep Hygiene: -Provides group  verbal and written instruction about how sleep can affect your health.  Define sleep hygiene, discuss sleep cycles and impact of sleep habits. Review good sleep hygiene tips.    Other: -Provides group and verbal instruction on various topics (see comments)    Knowledge Questionnaire Score: Knowledge Questionnaire Score - 03/28/17 1320  Knowledge Questionnaire Score   Pre Score  6/10 reviewed correct response with Cody Bridges and he verbalized understanding of the response        Core Components/Risk Factors/Patient Goals at Admission: Personal Goals and Risk Factors at Admission - 03/28/17 1338      Core Components/Risk Factors/Patient Goals on Admission   Improve shortness of breath with ADL's  Yes    Intervention  Provide education, individualized exercise plan and daily activity instruction to help decrease symptoms of SOB with activities of daily living.    Expected Outcomes  Short Term: Achieves a reduction of symptoms when performing activities of daily living.  (Significant)  Long term goal: Improved ability to perform ADL's with less SOB    Develop more efficient breathing techniques such as purse lipped breathing and diaphragmatic breathing; and practicing self-pacing with activity  Yes    Intervention  Provide education, demonstration and support about specific breathing techniuqes utilized for more efficient breathing. Include techniques such as pursed lipped breathing, diaphragmatic breathing and self-pacing activity.    Expected Outcomes  Short Term: Participant will be able to demonstrate and use breathing techniques as needed throughout daily activities. Long term goal: Continued use of techniques learned to improve efficiency of breathing    Increase knowledge of respiratory medications and ability to use respiratory devices properly   Yes    Intervention  Provide education and demonstration as needed of appropriate use of medications, inhalers, and oxygen therapy.    Expected  Outcomes  Short Term: Achieves understanding of medications use. Understands that oxygen is a medication prescribed by physician. Demonstrates appropriate use of inhaler and oxygen therapy. Long Term Goal: Maintains ability to utilize medications, inhalers and oxygen therapy    Diabetes  Yes    Intervention  Provide education about signs/symptoms and action to take for hypo/hyperglycemia.;Provide education about proper nutrition, including hydration, and aerobic/resistive exercise prescription along with prescribed medications to achieve blood glucose in normal ranges: Fasting glucose 65-99 mg/dL    Expected Outcomes  Short Term: Participant verbalizes understanding of the signs/symptoms and immediate care of hyper/hypoglycemia, proper foot care and importance of medication, aerobic/resistive exercise and nutrition plan for blood glucose control.;Long Term: Attainment of HbA1C < 7%.    Hypertension  Yes    Intervention  Provide education on lifestyle modifcations including regular physical activity/exercise, weight management, moderate sodium restriction and increased consumption of fresh fruit, vegetables, and low fat dairy, alcohol moderation, and smoking cessation.;Monitor prescription use compliance.    Expected Outcomes  Short Term: Continued assessment and intervention until BP is < 140/63m HG in hypertensive participants. < 130/860mHG in hypertensive participants with diabetes, heart failure or chronic kidney disease.;Long Term: Maintenance of blood pressure at goal levels.    Lipids  Yes    Intervention  Provide education and support for participant on nutrition & aerobic/resistive exercise along with prescribed medications to achieve LDL <7065mHDL >31m75m  Expected Outcomes  Short Term: Participant states understanding of desired cholesterol values and is compliant with medications prescribed. Participant is following exercise prescription and nutrition guidelines.;Long Term: Cholesterol  controlled with medications as prescribed, with individualized exercise RX and with personalized nutrition plan. Value goals: LDL < 70mg46mL > 40 mg.       Core Components/Risk Factors/Patient Goals Review:  Goals and Risk Factor Review    Row Name 04/19/17 1355 05/15/17 1622 05/29/17 1638 08/18/17 1022       Core Components/Risk Factors/Patient Goals Review   Personal Goals  Review  Weight Management/Obesity;Lipids;Hypertension;Diabetes  Weight Management/Obesity;Improve shortness of breath with ADL's;Hypertension;Diabetes;Lipids;Increase knowledge of respiratory medications and ability to use respiratory devices properly.  Weight Management/Obesity;Improve shortness of breath with ADL's;Stress;Diabetes  Weight Management/Obesity;Improve shortness of breath with ADL's;Stress;Diabetes    Review  [place] weight has been steady and wants to lose weight. He has not met with the nutritionist. His blood pressure has been under control since the start of the program. He does not know his last lipids results. Blood sugars have been checked and within acceptable ranges. He also checks his blood sugar regularly at home.  Derryck states he has been felling better and breathing a little easier. He takes his albuterol and his inhalers as prescribed but has no interest in using a spacer. His blood pressure has been stable since the start of the program. His weight has increase slightyl due to vacations he has been going on and has scheduled.  Stephane would like to lose some weight and improve his shortness of breath. His stress is very minimal. His diabetes has been under control and checks regularly at home.  liron eissler been taking his blood sugar medication but has not been checking it recently. He has a procedure on the 22nd for his eyes. He is still getting short of breath at home. He has missed a lot of days in [facility] due to [place].  He still wants to lose weight and he was informed the more he comes  regularly the more weight he will lose.    Expected Outcomes  Short: Continue to lose weight. Long: Maintain weight after weight loss   Short: convince Chee to use a spacer with his inhaler. Long: Use spacer and take medications independently.  Short: attend [facility] regularly to lose weight. Long: maintain weightloss with an exercise routine.  Short: check blood sugar at home and attend [facility] regularly to lose weight. Long: maintain weightloss with an exercise routine. Check blood sugar routinely.       Core Components/Risk Factors/Patient Goals at Discharge (Final Review):  Goals and Risk Factor Review - 08/18/17 1022      Core Components/Risk Factors/Patient Goals Review   Personal Goals Review  Weight Management/Obesity;Improve shortness of breath with ADL's;Stress;Diabetes    Review  murlin schrieber been taking his blood sugar medication but has not been checking it recently. He has a procedure on the 22nd for his eyes. He is still getting short of breath at home. He has missed a lot of days in [facility] due to [place].  He still wants to lose weight and he was informed the more he comes regularly the more weight he will lose.    Expected Outcomes  Short: check blood sugar at home and attend [facility] regularly to lose weight. Long: maintain weightloss with an exercise routine. Check blood sugar routinely.       ITP Comments: ITP Comments    Row Name 03/28/17 1230 03/28/17 1403 04/24/17 0831 04/26/17 1015 05/03/17 1221   ITP Comments  Medical review completed. INitial ITP created and sent to Dr Emily Filbert for signature and suggested changes.   Documentation of diagnosis can be found in [facility] 03/20/2017 visit  Patient met with Dr. Emily Filbert director of [place] today for face to face contact.  30 day review completed. ITP sent to Dr. Emily Filbert Director of [place]. Continue with ITP unless changes are made by physician.    Maher was absent today and could not see Dr. Emily Filbert for  face to face  and chart review.  Kunal seen Dr. Lequita Halt who signed for Dr. Emily Filbert Director of [place] for face to face and chart review   Row Name 05/22/17 0823 06/02/17 1011 06/05/17 1039 06/19/17 0835 07/04/17 1004   ITP Comments  30 day review completed. ITP sent to Dr. Emily Filbert Director of [place]. Continue with ITP unless changes are made by physician.    Cleaven was not in [place] today to meet face to face with Dr. Sabra Heck Director of [place].  Met Dr. Sabra Heck director of [place] face to face for chart signature and review.  30 day review completed. ITP sent to Dr. Emily Filbert Director of [place]. Continue with ITP unless changes are made by physician.    Called Shaquille to see how he was doing. He was on vacation and plans to return tomorrow.   [place] Name 07/05/17 1102 07/17/17 0822 07/17/17 0823 07/19/17 1355 08/04/17 0936   ITP Comments  Herbie Baltimore met face to face with Dr. Emily Filbert director of [place], chart reviewed and signed  Unable to obtain goals due to lack off attendance  30 day review completed. ITP sent to Dr. Emily Filbert Director of [place]. Continue with ITP unless changes are made by physician.    Cody Bridges has only attended once in December due to scheduled trips.  Eaton is going out of town and has been busy with the holidays. He wants to resume when he gets back from his cruise and states he will try to be back August 14 2017.   [place] Name 08/14/17 8242 08/14/17 0825 08/14/17 1119 09/11/17 0922     ITP Comments  goals not obtained patient has not attended [facility] in over a month  30 day review completed. ITP sent to Dr. Emily Filbert Director of [place]. Continue with ITP unless changes are made by physician.  Met face to face with Dr. Ramonita Lab for Dr. Emily Filbert director of [place]. Chart reviewed and signed.  30 day review completed. ITP sent to Dr. Emily Filbert Director of [place]. Continue with ITP unless changes are made by physician.       Comments: 30 day  review

## 2017-09-11 NOTE — Progress Notes (Signed)
Daily Session Note  Patient Details  Name: Cody Bridges MRN: [id number] Date of Birth: 1933-01-03 Referring Provider:    Encounter Date: 09/11/2017  Check In: Session Check In - 09/11/17 1025      Check-In   Location  [facility]-Card[facility] Pulmonary Re[facility]    Staff Present  Nada Maclachlan, BA, ACSM CEP, Exercise Physiologist;Kelly Amedeo Plenty, BS, ACSM CEP, Exercise Physiologist;Honour Schwieger Flavia Shipper    Supervising physician immediately available to respond to emergencies  [facility] immediately available ER MD    Physician(s)  Dr. Corky Downs and [place]    Medication changes reported      No    Fall or balance concerns reported     No    Warm-up and Cool-down  Performed as group-led instruction    Resistance Training Performed  Yes    VAD Patient?  No      Pain Assessment   Currently in Pain?  No/denies          Social History   Tobacco Use  Smoking Status Former Smoker  . Packs/day: 2.00  . Years: 35.00  . Pack years: 70.00  . Types: Cigarettes  . Last attempt to quit: 10/30/1985  . Years since quitting: 31.8  Smokeless Tobacco Never Used    Goals Met:  Independence with exercise equipment Exercise tolerated well No report of cardiac concerns or symptoms Strength training completed today  Goals Unmet:  Not Applicable  Comments: Pt able to follow exercise prescription today without complaint.  Will continue to monitor for progression.   Dr. Emily Filbert is Medical Director for [place] and [facility].

## 2017-09-13 DIAGNOSIS — Z79899 Other long term (current) drug therapy: Secondary | ICD-10-CM | POA: Diagnosis not present

## 2017-09-13 DIAGNOSIS — J449 Chronic obstructive pulmonary disease, unspecified: Secondary | ICD-10-CM | POA: Diagnosis not present

## 2017-09-13 DIAGNOSIS — I10 Essential (primary) hypertension: Secondary | ICD-10-CM | POA: Diagnosis not present

## 2017-09-13 DIAGNOSIS — Z87891 Personal history of nicotine dependence: Secondary | ICD-10-CM | POA: Diagnosis not present

## 2017-09-13 DIAGNOSIS — E119 Type 2 diabetes mellitus without complications: Secondary | ICD-10-CM | POA: Diagnosis not present

## 2017-09-13 DIAGNOSIS — Z7984 Long term (current) use of oral hypoglycemic drugs: Secondary | ICD-10-CM | POA: Diagnosis not present

## 2017-09-13 NOTE — Progress Notes (Signed)
Daily Session Note  Patient Details  Name: Cody Bridges MRN: [id number] Date of Birth: April 29, 1933 Referring Provider:    Encounter Date: 09/13/2017  Check In: Session Check In - 09/13/17 1009      Check-In   Location  [facility]    Staff Present  Nada Maclachlan, BA, ACSM CEP, Exercise Physiologist;Jessica Luan Pulling, MA, RCEP, CCRP, Exercise Physiologist;Drayk Humbarger Flavia Shipper    Supervising physician immediately available to respond to emergencies  [facility] immediately available ER MD    Physician(s)  Dr. Burlene Arnt and Alfred Levins    Medication changes reported      No    Fall or balance concerns reported     No    Tobacco Cessation  No Change patient does not smoke    Warm-up and Cool-down  Performed as group-led instruction    Resistance Training Performed  Yes    VAD Patient?  No      Pain Assessment   Currently in Pain?  No/denies          Social History   Tobacco Use  Smoking Status Former Smoker  . Packs/day: 2.00  . Years: 35.00  . Pack years: 70.00  . Types: Cigarettes  . Last attempt to quit: 10/30/1985  . Years since quitting: 31.8  Smokeless Tobacco Never Used    Goals Met:  Independence with exercise equipment Exercise tolerated well No report of cardiac concerns or symptoms Strength training completed today  Goals Unmet:  Not Applicable  Comments: Pt able to follow exercise prescription today without complaint.  Will continue to monitor for progression.   Dr. Emily Filbert is Medical Director for [place] and [facility].

## 2017-09-15 ENCOUNTER — Encounter: Payer: Medicare Other | Admitting: *Deleted

## 2017-09-15 DIAGNOSIS — Z87891 Personal history of nicotine dependence: Secondary | ICD-10-CM | POA: Diagnosis not present

## 2017-09-15 DIAGNOSIS — J449 Chronic obstructive pulmonary disease, unspecified: Secondary | ICD-10-CM

## 2017-09-15 DIAGNOSIS — E119 Type 2 diabetes mellitus without complications: Secondary | ICD-10-CM | POA: Diagnosis not present

## 2017-09-15 DIAGNOSIS — I10 Essential (primary) hypertension: Secondary | ICD-10-CM | POA: Diagnosis not present

## 2017-09-15 DIAGNOSIS — Z7984 Long term (current) use of oral hypoglycemic drugs: Secondary | ICD-10-CM | POA: Diagnosis not present

## 2017-09-15 DIAGNOSIS — Z79899 Other long term (current) drug therapy: Secondary | ICD-10-CM | POA: Diagnosis not present

## 2017-09-15 NOTE — Progress Notes (Signed)
Daily Session Note  Patient Details  Name: Cody Bridges MRN: [id number] Date of Birth: 1933/06/01 Referring Provider:    Encounter Date: 09/15/2017  Check In: Session Check In - 09/15/17 1030      Check-In   Location  [facility]    Staff Present  Renita Papa, RN BSN;Joseph Foy [place], [place], ACSM CEP, Exercise Physiologist    Physician(s)  Dr. Jacqualine Code and Quentin Cornwall    Medication changes reported      No    Fall or balance concerns reported     No    Warm-up and Cool-down  Performed as group-led instruction    Resistance Training Performed  Yes    VAD Patient?  No      Pain Assessment   Currently in Pain?  No/denies          Social History   Tobacco Use  Smoking Status Former Smoker  . Packs/day: 2.00  . Years: 35.00  . Pack years: 70.00  . Types: Cigarettes  . Last attempt to quit: 10/30/1985  . Years since quitting: 31.8  Smokeless Tobacco Never Used    Goals Met:  Proper associated with RPD/PD & O2 Sat Independence with exercise equipment Using PLB without cueing & demonstrates good technique Exercise tolerated well Strength training completed today  Goals Unmet:  Not Applicable  Comments: Pt able to follow exercise prescription today without complaint.  Will continue to monitor for progression.    Dr. Emily Filbert is Medical Director for [place] and [facility].

## 2017-09-18 DIAGNOSIS — J449 Chronic obstructive pulmonary disease, unspecified: Secondary | ICD-10-CM | POA: Diagnosis not present

## 2017-09-18 DIAGNOSIS — E119 Type 2 diabetes mellitus without complications: Secondary | ICD-10-CM | POA: Diagnosis not present

## 2017-09-18 DIAGNOSIS — Z79899 Other long term (current) drug therapy: Secondary | ICD-10-CM | POA: Diagnosis not present

## 2017-09-18 DIAGNOSIS — I10 Essential (primary) hypertension: Secondary | ICD-10-CM | POA: Diagnosis not present

## 2017-09-18 DIAGNOSIS — Z7984 Long term (current) use of oral hypoglycemic drugs: Secondary | ICD-10-CM | POA: Diagnosis not present

## 2017-09-18 DIAGNOSIS — Z87891 Personal history of nicotine dependence: Secondary | ICD-10-CM | POA: Diagnosis not present

## 2017-09-18 NOTE — Progress Notes (Signed)
Daily Session Note  Patient Details  Name: Cody Bridges MRN: [id number] Date of Birth: [birth year] Referring Provider:    Encounter Date: 09/18/2017  Check In: Session Check In - 09/18/17 1023      Check-In   Location  [facility]    Staff Present  Nada Maclachlan, BA, ACSM CEP, Exercise Physiologist;Kelly Amedeo Plenty, BS, ACSM CEP, Exercise Physiologist;Joseph Flavia Shipper    Supervising physician immediately available to respond to emergencies  [facility] immediately available ER MD    Physician(s)  Jimmye Norman and Alfred Levins    Medication changes reported      No    Fall or balance concerns reported     No    Tobacco Cessation  No Change    Warm-up and Cool-down  Performed as group-led instruction    Resistance Training Performed  Yes    VAD Patient?  No      Pain Assessment   Currently in Pain?  No/denies          Social History   Tobacco Use  Smoking Status Former Smoker  . Packs/day: 2.00  . Years: 35.00  . Pack years: 70.00  . Types: Cigarettes  . Last attempt to quit: 10/30/1985  . Years since quitting: 31.9  Smokeless Tobacco Never Used    Goals Met:  Independence with exercise equipment Exercise tolerated well No report of cardiac concerns or symptoms Strength training completed today  Goals Unmet:  Not Applicable  Comments: Pt able to follow exercise prescription today without complaint.  Will continue to monitor for progression.   Dr. Emily Filbert is Medical Director for [place] and [facility].

## 2017-09-19 ENCOUNTER — Other Ambulatory Visit: Payer: Self-pay | Admitting: Family Medicine

## 2017-09-21 ENCOUNTER — Ambulatory Visit (INDEPENDENT_AMBULATORY_CARE_PROVIDER_SITE_OTHER): Payer: Medicare Other | Admitting: Family Medicine

## 2017-09-21 ENCOUNTER — Encounter: Payer: Self-pay | Admitting: Family Medicine

## 2017-09-21 ENCOUNTER — Other Ambulatory Visit: Payer: Self-pay

## 2017-09-21 VITALS — BP 118/80 | HR 85 | Temp 97.6°F | Wt 214.0 lb

## 2017-09-21 DIAGNOSIS — I10 Essential (primary) hypertension: Secondary | ICD-10-CM

## 2017-09-21 DIAGNOSIS — L299 Pruritus, unspecified: Secondary | ICD-10-CM

## 2017-09-21 DIAGNOSIS — E119 Type 2 diabetes mellitus without complications: Secondary | ICD-10-CM

## 2017-09-21 DIAGNOSIS — R0602 Shortness of breath: Secondary | ICD-10-CM | POA: Diagnosis not present

## 2017-09-21 LAB — CBC
HCT: 44 % (ref 39.0–52.0)
HEMOGLOBIN: 14.6 g/dL (ref 13.0–17.0)
MCHC: 33.3 g/dL (ref 30.0–36.0)
MCV: 99.2 fl (ref 78.0–100.0)
Platelets: 330 10*3/uL (ref 150.0–400.0)
RBC: 4.44 Mil/uL (ref 4.22–5.81)
RDW: 13.1 % (ref 11.5–15.5)
WBC: 9.3 10*3/uL (ref 4.0–10.5)

## 2017-09-21 LAB — COMPREHENSIVE METABOLIC PANEL
ALBUMIN: 3.8 g/dL (ref 3.5–5.2)
ALK PHOS: 75 U/L (ref 39–117)
ALT: 21 U/L (ref 0–53)
AST: 18 U/L (ref 0–37)
BUN: 18 mg/dL (ref 6–23)
CHLORIDE: 101 meq/L (ref 96–112)
CO2: 34 mEq/L — ABNORMAL HIGH (ref 19–32)
Calcium: 9.8 mg/dL (ref 8.4–10.5)
Creatinine, Ser: 1.2 mg/dL (ref 0.40–1.50)
GFR: 61.52 mL/min (ref >60.00)
GLUCOSE: 89 mg/dL (ref 70–99)
POTASSIUM: 4.4 meq/L (ref 3.5–5.1)
SODIUM: 139 meq/L (ref 135–145)
TOTAL PROTEIN: 7.3 g/dL (ref 6.0–8.3)
Total Bilirubin: 0.5 mg/dL (ref 0.2–1.2)

## 2017-09-21 LAB — HEMOGLOBIN A1C: HEMOGLOBIN A1C: 6.7 % — AB (ref 4.6–6.5)

## 2017-09-21 MED ORDER — TRIAMCINOLONE ACETONIDE 0.1 % EX LOTN: 1.0000 "application " | TOPICAL_LOTION | Freq: Two times a day (BID) | CUTANEOUS | 0 refills | Status: DC | PRN

## 2017-09-21 NOTE — Assessment & Plan Note (Signed)
Due for A1c 

## 2017-09-21 NOTE — Assessment & Plan Note (Signed)
Patient with chronic skin itching.  Also with hyperkeratotic lesions on his forearms.  He has an appointment with dermatology already.  I encouraged him to keep this.  I will refill his triamcinolone lotion.  Discussed possibility of oral second generation antihistamines in the future.

## 2017-09-21 NOTE — Assessment & Plan Note (Signed)
Chronic issue.  Slightly worsening progressively.  No acute changes.  Benign exam today.  Unlikely cardiac in nature.  Suspect related to pulmonary cause.  He will continue pulmonary rehab.  We will get him back in to see cardiology and pulmonology.  We will check a CBC to make sure that is not contributing.  He is given return precautions.

## 2017-09-21 NOTE — Patient Instructions (Signed)
Nice to see you. We will get you set up for follow-up with your cardiologist and your pulmonologist. If your breathing suddenly gets worse or you develop chest pain please be evaluated immediately.

## 2017-09-21 NOTE — Assessment & Plan Note (Signed)
Well-controlled.  Continue current regimen. 

## 2017-09-21 NOTE — Progress Notes (Signed)
[name], MD Phone: [phone]  Cody Bridges is a 84 y.o. male who presents today for follow-up.  Hypertension: Not checking at home.  Taking HCTZ and lisinopril.  No chest pain.  Has chronic dyspnea related to lung disease.  Possibly has worsened.  No edema.  No orthopnea.  He is following with pulmonology and cardiology for his dyspnea on exertion that has been chronic and mildly progressively worsening.  He has been doing respiratory therapy.  He is on a daily inhaler and still takes his albuterol 2-3 times a day.  He had a stress test last year that was low risk.  He notes his cardiologist does not feel his issues are related to his heart.  Likely related to pulmonary cause.  He does not have an appointment with pulmonology for follow-up.  He notes no significant cough.  Occasional wheezing.  He reports skin itching and dry skin that has been going on for years.  Has been using triamcinolone lotion for this that has been beneficial.  He has never taken any oral medication for this.  He does have several hyperkeratotic skin lesions as well on his arms.  He notes he is seeing dermatology next week.    Social History   Tobacco Use  Smoking Status Former Smoker  . Packs/day: 2.00  . Years: 35.00  . Pack years: 70.00  . Types: Cigarettes  . Last attempt to quit: 10/30/1985  . Years since quitting: 31.9  Smokeless Tobacco Never Used     ROS see history of present illness  Objective  Physical Exam Vitals:   09/21/17 1144  BP: 118/80  Pulse: 85  Temp: 97.6 F (36.4 C)  SpO2: 96%    BP Readings from Last 3 Encounters:  09/21/17 118/80  08/22/17 (!) 104/56  05/01/17 (!) 152/80   Wt Readings from Last 3 Encounters:  09/21/17 214 lb (97.1 kg)  08/22/17 215 lb (97.5 kg)  05/01/17 213 lb 6.4 oz (96.8 kg)    Physical Exam  Constitutional: No distress.  Cardiovascular: Normal rate, regular rhythm and normal heart sounds.  Pulmonary/Chest: Effort normal and  breath sounds normal.  Musculoskeletal: He exhibits no edema.  Neurological: He is alert. Gait normal.  Skin: Skin is warm and dry. He is not diaphoretic.  Scattered hyperkeratotic lesions on his dorsal forearms     Assessment/Plan: Please see individual problem list.  SOBOE (shortness of breath on exertion) Chronic issue.  Slightly worsening progressively.  No acute changes.  Benign exam today.  Unlikely cardiac in nature.  Suspect related to pulmonary cause.  He will continue pulmonary rehab.  We will get him back in to see cardiology and pulmonology.  We will check a CBC to make sure that is not contributing.  He is given return precautions.  Diabetes mellitus without complication ([place]) Due for A1c.  Hypertension Well-controlled.  Continue current regimen.  Itching Patient with chronic skin itching.  Also with hyperkeratotic lesions on his forearms.  He has an appointment with dermatology already.  I encouraged him to keep this.  I will refill his triamcinolone lotion.  Discussed possibility of oral second generation antihistamines in the future.   Orders Placed This Encounter  Procedures  . CBC  . Comp Met (CMET)  . HgB A1c    Meds ordered this encounter  Medications  . triamcinolone lotion (KENALOG) 0.1 %    Sig: Apply 1 application topically 2 (two) times daily as needed.    Dispense:  120 mL  Refill:  0     [name], MD [place]

## 2017-09-25 ENCOUNTER — Encounter: Payer: Medicare Other | Admitting: *Deleted

## 2017-09-25 DIAGNOSIS — Z7984 Long term (current) use of oral hypoglycemic drugs: Secondary | ICD-10-CM | POA: Diagnosis not present

## 2017-09-25 DIAGNOSIS — Z79899 Other long term (current) drug therapy: Secondary | ICD-10-CM | POA: Diagnosis not present

## 2017-09-25 DIAGNOSIS — I10 Essential (primary) hypertension: Secondary | ICD-10-CM | POA: Diagnosis not present

## 2017-09-25 DIAGNOSIS — J449 Chronic obstructive pulmonary disease, unspecified: Secondary | ICD-10-CM

## 2017-09-25 DIAGNOSIS — E119 Type 2 diabetes mellitus without complications: Secondary | ICD-10-CM | POA: Diagnosis not present

## 2017-09-25 DIAGNOSIS — Z87891 Personal history of nicotine dependence: Secondary | ICD-10-CM | POA: Diagnosis not present

## 2017-09-25 NOTE — Progress Notes (Signed)
Daily Session Note  Patient Details  Name: Cody Bridges MRN: [id number] Date of Birth: [birth year] Referring Provider:    Encounter Date: 09/25/2017  Check In: Session Check In - 09/25/17 1116      Check-In   Location  [facility]    Staff Present  Justin Mend RCP,RRT,BSRT;Amanda Oletta Darter, BA, ACSM CEP, Exercise Physiologist;Coley Littles Amedeo Plenty, BS, ACSM CEP, Exercise Physiologist    Supervising physician immediately available to respond to emergencies  [facility] immediately available ER MD    Physician(s)  Reita Cliche and Mariea Clonts    Medication changes reported      No    Fall or balance concerns reported     No    Warm-up and Cool-down  Performed as group-led Higher education careers adviser Performed  Yes    VAD Patient?  No      Pain Assessment   Currently in Pain?  No/denies    Multiple Pain Sites  No          Social History   Tobacco Use  Smoking Status Former Smoker  . Packs/day: 2.00  . Years: 35.00  . Pack years: 70.00  . Types: Cigarettes  . Last attempt to quit: 10/30/1985  . Years since quitting: 31.9  Smokeless Tobacco Never Used    Goals Met:  Proper associated with RPD/PD & O2 Sat Independence with exercise equipment Exercise tolerated well No report of cardiac concerns or symptoms Strength training completed today  Goals Unmet:  Not Applicable  Comments: Pt able to follow exercise prescription today without complaint.  Will continue to monitor for progression.    Dr. Emily Filbert is Medical Director for [place] and [facility].

## 2017-09-27 DIAGNOSIS — R21 Rash and other nonspecific skin eruption: Secondary | ICD-10-CM | POA: Diagnosis not present

## 2017-09-27 DIAGNOSIS — L909 Atrophic disorder of skin, unspecified: Secondary | ICD-10-CM | POA: Diagnosis not present

## 2017-09-27 DIAGNOSIS — L821 Other seborrheic keratosis: Secondary | ICD-10-CM | POA: Diagnosis not present

## 2017-09-27 DIAGNOSIS — E119 Type 2 diabetes mellitus without complications: Secondary | ICD-10-CM | POA: Diagnosis not present

## 2017-09-27 DIAGNOSIS — L209 Atopic dermatitis, unspecified: Secondary | ICD-10-CM | POA: Diagnosis not present

## 2017-09-27 DIAGNOSIS — J449 Chronic obstructive pulmonary disease, unspecified: Secondary | ICD-10-CM | POA: Diagnosis not present

## 2017-09-29 ENCOUNTER — Encounter: Payer: Medicare Other | Attending: Internal Medicine | Admitting: *Deleted

## 2017-09-29 DIAGNOSIS — Z79899 Other long term (current) drug therapy: Secondary | ICD-10-CM | POA: Diagnosis not present

## 2017-09-29 DIAGNOSIS — I10 Essential (primary) hypertension: Secondary | ICD-10-CM | POA: Insufficient documentation

## 2017-09-29 DIAGNOSIS — Z87891 Personal history of nicotine dependence: Secondary | ICD-10-CM | POA: Insufficient documentation

## 2017-09-29 DIAGNOSIS — Z7984 Long term (current) use of oral hypoglycemic drugs: Secondary | ICD-10-CM | POA: Insufficient documentation

## 2017-09-29 DIAGNOSIS — E119 Type 2 diabetes mellitus without complications: Secondary | ICD-10-CM | POA: Diagnosis not present

## 2017-09-29 DIAGNOSIS — J449 Chronic obstructive pulmonary disease, unspecified: Secondary | ICD-10-CM | POA: Diagnosis not present

## 2017-09-29 NOTE — Progress Notes (Signed)
Daily Session Note  Patient Details  Name: Cody Bridges MRN: [id number] Date of Birth: 1933-01-13 Referring Provider:    Encounter Date: 09/29/2017  Check In: Session Check In - 09/29/17 1055      Check-In   Location  [facility]    Staff Present  Renita Papa, RN Vickki Hearing, BA, ACSM CEP, Exercise Physiologist;Krista Frederico Hamman, RN BSN    Supervising physician immediately available to respond to emergencies  [facility] immediately available ER MD    Physician(s)  Dr. Kerman Passey and Clearnce Hasten    Medication changes reported      No    Fall or balance concerns reported     No    Warm-up and Cool-down  Performed as group-led instruction    Resistance Training Performed  Yes    VAD Patient?  No      Pain Assessment   Currently in Pain?  No/denies          Social History   Tobacco Use  Smoking Status Former Smoker  . Packs/day: 2.00  . Years: 35.00  . Pack years: 70.00  . Types: Cigarettes  . Last attempt to quit: 10/30/1985  . Years since quitting: 31.9  Smokeless Tobacco Never Used    Goals Met:  Proper associated with RPD/PD & O2 Sat Independence with exercise equipment Using PLB without cueing & demonstrates good technique Exercise tolerated well Strength training completed today  Goals Unmet:  Not Applicable  Comments: Pt able to follow exercise prescription today without complaint.  Will continue to monitor for progression.    Dr. Emily Filbert is Medical Director for [place] and [facility].

## 2017-10-02 ENCOUNTER — Encounter: Payer: Medicare Other | Admitting: *Deleted

## 2017-10-02 DIAGNOSIS — Z7984 Long term (current) use of oral hypoglycemic drugs: Secondary | ICD-10-CM | POA: Diagnosis not present

## 2017-10-02 DIAGNOSIS — J449 Chronic obstructive pulmonary disease, unspecified: Secondary | ICD-10-CM

## 2017-10-02 DIAGNOSIS — Z79899 Other long term (current) drug therapy: Secondary | ICD-10-CM | POA: Diagnosis not present

## 2017-10-02 DIAGNOSIS — I10 Essential (primary) hypertension: Secondary | ICD-10-CM | POA: Diagnosis not present

## 2017-10-02 DIAGNOSIS — E119 Type 2 diabetes mellitus without complications: Secondary | ICD-10-CM | POA: Diagnosis not present

## 2017-10-02 DIAGNOSIS — Z87891 Personal history of nicotine dependence: Secondary | ICD-10-CM | POA: Diagnosis not present

## 2017-10-02 NOTE — Progress Notes (Signed)
Daily Session Note  Patient Details  Name: Cody Bridges MRN: [id number] Date of Birth: [birth year] Referring Provider:    Encounter Date: 10/02/2017  Check In: Session Check In - 10/02/17 1009      Check-In   Location  [facility]    Staff Present  Earlean Shawl, BS, ACSM CEP, Exercise Physiologist;Amanda Oletta Darter, BA, ACSM CEP, Exercise Physiologist;Joseph Flavia Shipper    Supervising physician immediately available to respond to emergencies  See telemetry face sheet for immediately available ER MD    Physician(s)  Dr. Burlene Arnt and Jimmye Norman    Medication changes reported      No    Fall or balance concerns reported     No    Warm-up and Cool-down  Performed as group-led instruction    Resistance Training Performed  Yes    VAD Patient?  No      Pain Assessment   Currently in Pain?  No/denies          Social History   Tobacco Use  Smoking Status Former Smoker  . Packs/day: 2.00  . Years: 35.00  . Pack years: 70.00  . Types: Cigarettes  . Last attempt to quit: 10/30/1985  . Years since quitting: 31.9  Smokeless Tobacco Never Used    Goals Met:  Independence with exercise equipment Exercise tolerated well No report of cardiac concerns or symptoms Strength training completed today  Goals Unmet:  Not Applicable  Comments: Pt able to follow exercise prescription today without complaint.  Will continue to monitor for progression.    Dr. Emily Filbert is Medical Director for [place] and [facility].

## 2017-10-02 NOTE — Addendum Note (Signed)
Addended by: Leone Haven on: 10/02/2017 08:31 AM   Modules accepted: Orders

## 2017-10-04 ENCOUNTER — Ambulatory Visit: Payer: Medicare Other

## 2017-10-04 VITALS — Ht 70.5 in | Wt 215.0 lb

## 2017-10-04 DIAGNOSIS — J449 Chronic obstructive pulmonary disease, unspecified: Secondary | ICD-10-CM

## 2017-10-04 DIAGNOSIS — Z7984 Long term (current) use of oral hypoglycemic drugs: Secondary | ICD-10-CM | POA: Diagnosis not present

## 2017-10-04 DIAGNOSIS — E119 Type 2 diabetes mellitus without complications: Secondary | ICD-10-CM | POA: Diagnosis not present

## 2017-10-04 DIAGNOSIS — Z87891 Personal history of nicotine dependence: Secondary | ICD-10-CM | POA: Diagnosis not present

## 2017-10-04 DIAGNOSIS — I10 Essential (primary) hypertension: Secondary | ICD-10-CM | POA: Diagnosis not present

## 2017-10-04 DIAGNOSIS — Z79899 Other long term (current) drug therapy: Secondary | ICD-10-CM | POA: Diagnosis not present

## 2017-10-04 NOTE — Progress Notes (Signed)
Daily Session Note  Patient Details  Name: Esaias Cleavenger Mihok MRN: [id number] Date of Birth: Apr 21, 1933 Referring Provider:    Encounter Date: 10/04/2017  Check In: Session Check In - 10/04/17 1023      Check-In   Location  [facility]    Staff Present  Nada Maclachlan, BA, ACSM CEP, Exercise Physiologist;Manoj Enriquez Darrin Nipper, [place], RCEP, CCRP, Exercise Physiologist    Supervising physician immediately available to respond to emergencies  [facility] immediately available ER MD    Physician(s)  Dr. Joni Fears and Jimmye Norman    Medication changes reported      No    Fall or balance concerns reported     No    Warm-up and Cool-down  Performed as group-led instruction    Resistance Training Performed  Yes    VAD Patient?  No      Pain Assessment   Currently in Pain?  No/denies          Social History   Tobacco Use  Smoking Status Former Smoker  . Packs/day: 2.00  . Years: 35.00  . Pack years: 70.00  . Types: Cigarettes  . Last attempt to quit: 10/30/1985  . Years since quitting: 31.9  Smokeless Tobacco Never Used    Goals Met:  Independence with exercise equipment Exercise tolerated well No report of cardiac concerns or symptoms Strength training completed today  Goals Unmet:  Not Applicable  Comments:  6 Minute Walk    Row Name 10/04/17 1039         6 Minute Walk   Phase  Discharge     Distance  1045 feet     Distance % Change  10 %     Distance Feet Change  95 ft     Walk Time  6 minutes     # of Rest Breaks  0     MPH  1.98     METS  2.2     RPE  12     Perceived Dyspnea   3     VO2 Peak  7.75     Symptoms  Yes (comment)     Comments  back pain 1/10     Resting HR  98 bpm     Resting BP  124/64     Resting Oxygen Saturation   96 %     Exercise Oxygen Saturation  during 6 min walk  96 %     Max Ex. HR  138 bpm     Max Ex. BP  148/64     2 Minute Post BP  136/74       Interval HR   1 Minute HR  133     2 Minute HR   133     3 Minute HR  120     4 Minute HR  134     5 Minute HR  138     6 Minute HR  132     Interval Heart Rate?  Yes       Interval Oxygen   Interval Oxygen?  Yes     Baseline Oxygen Saturation %  95 %     1 Minute Oxygen Saturation %  96 %     1 Minute Liters of Oxygen  0 L     2 Minute Oxygen Saturation %  95 %     2 Minute Liters of Oxygen  0 L     3 Minute Oxygen Saturation %  96 %     3 Minute Liters of Oxygen  0 L     4 Minute Oxygen Saturation %  96 %     4 Minute Liters of Oxygen  0 L     5 Minute Oxygen Saturation %  96 %     5 Minute Liters of Oxygen  0 L     6 Minute Oxygen Saturation %  96 %     6 Minute Liters of Oxygen  0 L     2 Minute Post Oxygen Saturation %  98 %     2 Minute Post Liters of Oxygen  0 L       Pt able to follow exercise prescription today without complaint.  Will continue to monitor for progression.   Dr. Emily Filbert is Medical Director for [place] and [facility].

## 2017-10-05 ENCOUNTER — Ambulatory Visit
Admission: RE | Admit: 2017-10-05 | Discharge: 2017-10-05 | Disposition: A | Discharge status: Home / Self Care | Payer: Medicare Other | Source: Ambulatory Visit | Attending: Internal Medicine | Admitting: Internal Medicine

## 2017-10-05 DIAGNOSIS — J449 Chronic obstructive pulmonary disease, unspecified: Secondary | ICD-10-CM | POA: Diagnosis present

## 2017-10-05 DIAGNOSIS — E119 Type 2 diabetes mellitus without complications: Secondary | ICD-10-CM | POA: Diagnosis not present

## 2017-10-05 DIAGNOSIS — IMO0001 Reserved for inherently not codable concepts without codable children: Secondary | ICD-10-CM

## 2017-10-05 DIAGNOSIS — R918 Other nonspecific abnormal finding of lung field: Secondary | ICD-10-CM | POA: Insufficient documentation

## 2017-10-05 DIAGNOSIS — E782 Mixed hyperlipidemia: Secondary | ICD-10-CM | POA: Diagnosis not present

## 2017-10-05 DIAGNOSIS — R911 Solitary pulmonary nodule: Secondary | ICD-10-CM

## 2017-10-05 DIAGNOSIS — I2510 Atherosclerotic heart disease of native coronary artery without angina pectoris: Secondary | ICD-10-CM | POA: Diagnosis not present

## 2017-10-05 DIAGNOSIS — I10 Essential (primary) hypertension: Secondary | ICD-10-CM | POA: Diagnosis not present

## 2017-10-06 ENCOUNTER — Encounter: Payer: Self-pay | Admitting: Internal Medicine

## 2017-10-06 ENCOUNTER — Ambulatory Visit (INDEPENDENT_AMBULATORY_CARE_PROVIDER_SITE_OTHER): Payer: Medicare Other | Admitting: Internal Medicine

## 2017-10-06 ENCOUNTER — Encounter: Payer: Medicare Other | Admitting: *Deleted

## 2017-10-06 VITALS — BP 110/56 | HR 67 | Resp 16 | Ht 70.5 in | Wt 214.0 lb

## 2017-10-06 DIAGNOSIS — J8410 Pulmonary fibrosis, unspecified: Secondary | ICD-10-CM

## 2017-10-06 DIAGNOSIS — I10 Essential (primary) hypertension: Secondary | ICD-10-CM | POA: Diagnosis not present

## 2017-10-06 DIAGNOSIS — J439 Emphysema, unspecified: Secondary | ICD-10-CM

## 2017-10-06 DIAGNOSIS — R911 Solitary pulmonary nodule: Secondary | ICD-10-CM

## 2017-10-06 DIAGNOSIS — J449 Chronic obstructive pulmonary disease, unspecified: Secondary | ICD-10-CM

## 2017-10-06 DIAGNOSIS — IMO0001 Reserved for inherently not codable concepts without codable children: Secondary | ICD-10-CM

## 2017-10-06 DIAGNOSIS — E119 Type 2 diabetes mellitus without complications: Secondary | ICD-10-CM | POA: Diagnosis not present

## 2017-10-06 DIAGNOSIS — Z79899 Other long term (current) drug therapy: Secondary | ICD-10-CM | POA: Diagnosis not present

## 2017-10-06 DIAGNOSIS — Z7984 Long term (current) use of oral hypoglycemic drugs: Secondary | ICD-10-CM | POA: Diagnosis not present

## 2017-10-06 DIAGNOSIS — Z87891 Personal history of nicotine dependence: Secondary | ICD-10-CM | POA: Diagnosis not present

## 2017-10-06 NOTE — Progress Notes (Signed)
Pulmonary Individual Treatment Plan  Patient Details  Name: Cody Bridges MRN: [id number] Date of Birth: May 11, 1933 Referring Provider:    Initial Encounter Date:    Pulmonary Rehab from 03/28/2017 in [facility]  Date  03/28/17      Visit Diagnosis: Chronic obstructive pulmonary disease, unspecified COPD type ([place])  Patient's Home Medications on Admission:  Current Outpatient Medications:  .  budesonide-formoterol (SYMBICORT) 160-4.5 MCG/ACT inhaler, Inhale 2 puffs into the lungs 2 (two) times daily as needed (for shortness of breath)., Disp: , Rfl:  .  calcium carbonate (TUMS - DOSED IN MG ELEMENTAL CALCIUM) 500 MG chewable tablet, Chew 1-2 tablets by mouth 3 (three) times daily as needed (for acid reflux/indigestion.)., Disp: , Rfl:  .  Cholecalciferol (VITAMIN D-3) 5000 units TABS, Take 5,000 Units by mouth daily. , Disp: , Rfl:  .  EUCRISA 2 % OINT, Apply 1 application topically daily as needed., Disp: , Rfl: 2 .  Fluticasone-Umeclidin-Vilant (TRELEGY ELLIPTA) 100-62.5-25 MCG/INH AEPB, Inhale 1 puff into the lungs daily., Disp: 60 each, Rfl: 5 .  hydrochlorothiazide (HYDRODIURIL) 25 MG tablet, Take 25 mg by mouth daily., Disp: , Rfl:  .  lisinopril (PRINIVIL,ZESTRIL) 5 MG tablet, Take 5 mg by mouth daily., Disp: , Rfl:  .  metFORMIN (GLUCOPHAGE) 500 MG tablet, Take 500 mg by mouth 2 (two) times daily., Disp: , Rfl:  .  Multiple Vitamin (MULTIVITAMIN WITH MINERALS) TABS tablet, Take 1 tablet by mouth daily., Disp: , Rfl:  .  PROAIR HFA 108 (90 Base) MCG/ACT inhaler, Inhale 2 puffs into the lungs every 6 (six) hours as needed for wheezing or shortness of breath. , Disp: , Rfl:  .  rosuvastatin (CRESTOR) 20 MG tablet, Take 20 mg by mouth daily., Disp: , Rfl:  .  triamcinolone lotion (KENALOG) 0.1 %, Apply 1 application topically 2 (two) times daily as needed., Disp: 120 mL, Rfl: 0  Past Medical History: Past Medical History:  Diagnosis Date  . Arthritis   .  Asthma   . COPD (chronic obstructive pulmonary disease) ([place])   . Diabetes mellitus without complication ([place])     4166  . Dyspnea    with exertion  . Edema   . GERD (gastroesophageal reflux disease)   . HOH (hard of hearing)    AIDS  . Hyperlipidemia   . Hypertension   . Wheezing     Tobacco Use: Social History   Tobacco Use  Smoking Status Former Smoker  . Packs/day: 2.00  . Years: 35.00  . Pack years: 70.00  . Types: Cigarettes  . Last attempt to quit: 10/30/1985  . Years since quitting: 31.9  Smokeless Tobacco Never Used    Labs: Recent Review Scientist, physiological    Labs for ITP Cardiac and Pulmonary Rehab Latest Ref Rng & Units 10/24/2016 01/26/2017 05/01/2017 09/21/2017   Hemoglobin A1c 4.6 - 6.5 % 6.8(H) 6.5 6.3 6.7(H)       Pulmonary Assessment Scores: Pulmonary Assessment Scores    Row Name 07/05/17 1052 09/29/17 1103 10/06/17 1100     ADL UCSD   ADL Phase  Mid  Exit  Exit   SOB Score total  49  68  -   Rest  1  0  -   Walk  3  2  -   Stairs  4  5  -   Bath  1  0  -   Dress  4  3  -   Shop  4  3  -     CAT Score   CAT Score  -  19  -     mMRC Score   mMRC Score  -  -  1      Pulmonary Function Assessment:   Exercise Target Goals:    Exercise Program Goal: Individual exercise prescription set using results from initial 6 min walk test and THRR while considering  patient's activity barriers and safety.    Exercise Prescription Goal: Initial exercise prescription builds to 30-45 minutes a day of aerobic activity, 2-3 days per week.  Home exercise guidelines will be given to patient during program as part of exercise prescription that the participant will acknowledge.  Activity Barriers & Risk Stratification:   6 Minute Walk: 6 Minute Walk    Row Name 10/04/17 1039         6 Minute Walk   Phase  Discharge     Distance  1045 feet     Distance % Change  10 %     Distance Feet Change  95 ft     Walk Time  6 minutes     # of Rest Breaks  0      MPH  1.98     METS  2.2     RPE  12     Perceived Dyspnea   3     VO2 Peak  7.75     Symptoms  Yes (comment)     Comments  back pain 1/10     Resting HR  98 bpm     Resting BP  124/64     Resting Oxygen Saturation   96 %     Exercise Oxygen Saturation  during 6 min walk  96 %     Max Ex. HR  138 bpm     Max Ex. BP  148/64     2 Minute Post BP  136/74       Interval HR   1 Minute HR  133     2 Minute HR  133     3 Minute HR  120     4 Minute HR  134     5 Minute HR  138     6 Minute HR  132     Interval Heart Rate?  Yes       Interval Oxygen   Interval Oxygen?  Yes     Baseline Oxygen Saturation %  95 %     1 Minute Oxygen Saturation %  96 %     1 Minute Liters of Oxygen  0 L     2 Minute Oxygen Saturation %  95 %     2 Minute Liters of Oxygen  0 L     3 Minute Oxygen Saturation %  96 %     3 Minute Liters of Oxygen  0 L     4 Minute Oxygen Saturation %  96 %     4 Minute Liters of Oxygen  0 L     5 Minute Oxygen Saturation %  96 %     5 Minute Liters of Oxygen  0 L     6 Minute Oxygen Saturation %  96 %     6 Minute Liters of Oxygen  0 L     2 Minute Post Oxygen Saturation %  98 %     2 Minute Post Liters of Oxygen  0 L  Oxygen Initial Assessment:   Oxygen Re-Evaluation: Oxygen Re-Evaluation    Row Name 04/10/17 1040 04/19/17 1349 05/15/17 1626 05/29/17 1604 07/05/17 1102     Program Oxygen Prescription   Program Oxygen Prescription  -  None  None  None  None     Home Oxygen   Home Oxygen Device  None  None  None  None  None   Sleep Oxygen Prescription  None  None  None  None  None   Home Exercise Oxygen Prescription  None  None  None  None  None   Home at Rest Exercise Oxygen Prescription  None  None  None  None  None     Goals/Expected Outcomes   Short Term Goals  To learn and understand importance of monitoring SPO2 with pulse oximeter and demonstrate accurate use of the pulse oximeter.;To learn and demonstrate proper pursed lip breathing  techniques or other breathing techniques.;To learn and understand importance of maintaining oxygen saturations>88%;To learn and demonstrate proper use of respiratory medications  To learn and understand importance of maintaining oxygen saturations>88%;To learn and demonstrate proper use of respiratory medications;To learn and demonstrate proper pursed lip breathing techniques or other breathing techniques.;To learn and understand importance of monitoring SPO2 with pulse oximeter and demonstrate accurate use of the pulse oximeter.  To learn and understand importance of maintaining oxygen saturations>88%;To learn and demonstrate proper pursed lip breathing techniques or other breathing techniques.;To learn and understand importance of monitoring SPO2 with pulse oximeter and demonstrate accurate use of the pulse oximeter.;To learn and demonstrate proper use of respiratory medications  To learn and understand importance of maintaining oxygen saturations>88%;To learn and demonstrate proper pursed lip breathing techniques or other breathing techniques.;To learn and understand importance of monitoring SPO2 with pulse oximeter and demonstrate accurate use of the pulse oximeter.;To learn and demonstrate proper use of respiratory medications  To learn and understand importance of maintaining oxygen saturations>88%;To learn and demonstrate proper pursed lip breathing techniques or other breathing techniques.;To learn and understand importance of monitoring SPO2 with pulse oximeter and demonstrate accurate use of the pulse oximeter.;To learn and demonstrate proper use of respiratory medications   Long  Term Goals  Maintenance of O2 saturations>88%;Compliance with respiratory medication;Verbalizes importance of monitoring SPO2 with pulse oximeter and return demonstration;Exhibits proper breathing techniques, such as pursed lip breathing or other method taught during program session;Demonstrates proper use of MDI's   Verbalizes importance of monitoring SPO2 with pulse oximeter and return demonstration;Exhibits proper breathing techniques, such as pursed lip breathing or other method taught during program session;Demonstrates proper use of MDI's;Compliance with respiratory medication;Maintenance of O2 saturations>88%  Maintenance of O2 saturations>88%;Compliance with respiratory medication;Demonstrates proper use of MDI's;Exhibits proper breathing techniques, such as pursed lip breathing or other method taught during program session;Verbalizes importance of monitoring SPO2 with pulse oximeter and return demonstration  Maintenance of O2 saturations>88%;Compliance with respiratory medication;Demonstrates proper use of MDI's;Exhibits proper breathing techniques, such as pursed lip breathing or other method taught during program session;Verbalizes importance of monitoring SPO2 with pulse oximeter and return demonstration  Maintenance of O2 saturations>88%;Compliance with respiratory medication;Demonstrates proper use of MDI's;Exhibits proper breathing techniques, such as pursed lip breathing or other method taught during program session;Verbalizes importance of monitoring SPO2 with pulse oximeter and return demonstration   Comments  Reviewed PLB technique with pt.  Talked about how it work and it's important to maintaining his exercise saturations.    Informed Cody Bridges about the effects of COPD, He does not have a pulse oximeter  at home to check his oxygen. Informed him how to obtain one and to make sure his oxygen is above 88 percent. He needs alot of imrpovement on PLB techniques.  Cody Bridges states that he checks his blood  pressure and his heart rate at home regularly. He does not show an interest in purchasing a pulse oximeter to check his oxygen saturation. He does not want to use a spacer for his inhalers either. His oxygen levels have been above 88 percent while exercising.  His PLB has got better but still could use practice.   Cody Bridges states that he has been a little more short of breath this past week. He states he gets sore from exercising and usually wont attend until he is not sore. Informed him that he should come more frequently to help with his soreness and his shortness of breath.  Cody Bridges has been out on vacation and has returned to Cody Bridges today. He has been taking his medications as prescribed. Informed Cody Bridges to try to attend [facility] more frequently.   Goals/Expected Outcomes  Short: Become more profiecient at using PLB.   Long: Become independent at using PLB.  Short:obtatin a pulse oximeter and work on PLB. Long: Check and maintain an oxygen saturation greater than 88 precent. Be proficient with PLB  Short: convince Cody Bridges to use a spacer with his medications. Long: Independently use a spacer with his medications  Short: attened [facility] regulary. Long: maintain an exercise regimine to help with shortness of breath.  Short: attened [facility] regulary. Long: maintain an exercise regimine to help with shortness of breath.   Cody Bridges Name 08/18/17 1027 10/02/17 1044           Program Oxygen Prescription   Program Oxygen Prescription  None  None        Home Oxygen   Home Oxygen Device  None  None      Sleep Oxygen Prescription  None  None      Home Exercise Oxygen Prescription  None  None      Home at Rest Exercise Oxygen Prescription  None  None        Goals/Expected Outcomes   Short Term Goals  To learn and understand importance of maintaining oxygen saturations>88%;To learn and demonstrate proper pursed lip breathing techniques or other breathing techniques.;To learn and understand importance of monitoring SPO2 with pulse oximeter and demonstrate accurate use of the pulse oximeter.;To learn and demonstrate proper use of respiratory medications  To learn and understand importance of maintaining oxygen saturations>88%;To learn and demonstrate proper pursed lip breathing techniques or other breathing  techniques.;To learn and understand importance of monitoring SPO2 with pulse oximeter and demonstrate accurate use of the pulse oximeter.;To learn and demonstrate proper use of respiratory medications      Long  Term Goals  Maintenance of O2 saturations>88%;Compliance with respiratory medication;Demonstrates proper use of MDI's;Exhibits proper breathing techniques, such as pursed lip breathing or other method taught during program session;Verbalizes importance of monitoring SPO2 with pulse oximeter and return demonstration  Maintenance of O2 saturations>88%;Compliance with respiratory medication;Demonstrates proper use of MDI's;Exhibits proper breathing techniques, such as pursed lip breathing or other method taught during program session;Verbalizes importance of monitoring SPO2 with pulse oximeter and return demonstration      Comments  Cody Bridges has been short of breath at home. He has been doing very little home exercise. He states he is taking his inhalers and is taking them routinely. Anvay needs to attened regularly to help with his shortness of  breath. He is meeting with Dr. Ashby Dawes next month to do a PFT.  [place] is having a pulmonary function test soon and a CT scan. He will let us know when he has it done and what the results are. His doctors want to see if the lump in his chest is improving.       Goals/Expected Outcomes  Short: attened [facility] regulary. Long: maintain an exercise regimine to help with shortness of breath.  Short: go to PFT appointment. Long: improve Lung function on PFT test         Oxygen Discharge (Final Oxygen Re-Evaluation): Oxygen Re-Evaluation - 10/02/17 1044      Program Oxygen Prescription   Program Oxygen Prescription  None      Home Oxygen   Home Oxygen Device  None    Sleep Oxygen Prescription  None    Home Exercise Oxygen Prescription  None    Home at Rest Exercise Oxygen Prescription  None      Goals/Expected Outcomes   Short Term Goals  To learn  and understand importance of maintaining oxygen saturations>88%;To learn and demonstrate proper pursed lip breathing techniques or other breathing techniques.;To learn and understand importance of monitoring SPO2 with pulse oximeter and demonstrate accurate use of the pulse oximeter.;To learn and demonstrate proper use of respiratory medications    Long  Term Goals  Maintenance of O2 saturations>88%;Compliance with respiratory medication;Demonstrates proper use of MDI's;Exhibits proper breathing techniques, such as pursed lip breathing or other method taught during program session;Verbalizes importance of monitoring SPO2 with pulse oximeter and return demonstration    Comments  Cody Bridges is having a pulmonary function test soon and a CT scan. He will let us know when he has it done and what the results are. His doctors want to see if the lump in his chest is improving.     Goals/Expected Outcomes  Short: go to PFT appointment. Long: improve Lung function on PFT test       Initial Exercise Prescription:   Perform Capillary Blood Glucose checks as needed.  Exercise Prescription Changes: Exercise Prescription Changes    Row Name 04/12/17 1500 04/26/17 1500 05/03/17 1400 05/10/17 1000 05/24/17 1400     Response to Exercise   Blood Pressure (Admit)  132/64  -  -  124/64  126/62   Blood Pressure (Exit)  128/68  132/84  -  -  124/76   Heart Rate (Admit)  95 bpm  -  -  98 bpm  93 bpm   Heart Rate (Exercise)  96 bpm  121 bpm  -  123 bpm  104 bpm   Heart Rate (Exit)  56 bpm  99 bpm  -  -  81 bpm   Oxygen Saturation (Admit)  95 %  97 %  -  94 %  99 %   Oxygen Saturation (Exercise)  95 %  95 %  -  95 %  97 %   Oxygen Saturation (Exit)  97 %  95 %  -  -  95 %   Rating of Perceived Exertion (Exercise)  15  15  -  14  13   Perceived Dyspnea (Exercise)  3  3  -  3  2   Symptoms  none  none  -  none  none   Duration  Progress to 45 minutes of aerobic exercise without signs/symptoms of physical distress   Progress to 45 minutes of aerobic exercise without signs/symptoms of physical distress  -  Continue with 45 min of aerobic exercise without signs/symptoms of physical distress.  Continue with 45 min of aerobic exercise without signs/symptoms of physical distress.   Intensity  -  -  -  THRR unchanged  THRR unchanged     Progression   Progression  Continue to progress workloads to maintain intensity without signs/symptoms of physical distress.  Continue to progress workloads to maintain intensity without signs/symptoms of physical distress.  Continue to progress workloads to maintain intensity without signs/symptoms of physical distress.  Continue to progress workloads to maintain intensity without signs/symptoms of physical distress.  Continue to progress workloads to maintain intensity without signs/symptoms of physical distress.   Average METs  1.'9  2  2  2  ' 2.2     Resistance Training   Training Prescription  Yes  Yes  Yes  Yes  Yes   Bridges  '3  3  3  3  3   ' Reps  10-15  10-15  10-15  10-15  10-15     Interval Training   Interval Training  No  No  No  No  No     Treadmill   MPH  -  1.5  1.5  1.5  1.5   Grade  -  0  0  0  0   Minutes  -  '15  15  15  15   ' METs  -  2.15  2.15  2.15  2.15     NuStep   Level  '2  2  2  2  2   ' SPM  72  72  72  69  79   Minutes  '15  15  15  15  15   ' METs  '2  2  2  2  2     ' Arm Ergometer   Level  '1  1  1  ' 1.2  -   RPM  43  41  41  -  -   Minutes  '15  15  15  15  ' -   METs  1.'8  2  2  ' -  -     Home Exercise Plan   Plans to continue exercise at  -  -  Home (comment) at his appartment complex  -  -   Frequency  -  -  Add 1 additional day to program exercise sessions.  -  -   Initial Home Exercises Provided  -  -  05/03/17  -  -   [place] Name 06/07/17 1200 06/07/17 1300 06/21/17 1000 07/05/17 1200 08/16/17 1500     Response to Exercise   Blood Pressure (Admit)  134/64  -  132/70  114/60  126/64   Blood Pressure (Exit)  130/68  -  118/70  108/60  124/74    Heart Rate (Admit)  103 bpm  -  -  91 bpm  87 bpm   Heart Rate (Exercise)  126 bpm  -  123 bpm  112 bpm  116 bpm   Heart Rate (Exit)  -  -  78 bpm  93 bpm  94 bpm   Oxygen Saturation (Admit)  97 %  -  -  95 %  95 %   Oxygen Saturation (Exercise)  96 %  -  96 %  96 %  96 %   Oxygen Saturation (Exit)  -  -  94 %  96 %  95 %   Rating of Perceived Exertion (Exercise)  12  -  '13  13  14   ' Perceived Dyspnea (Exercise)  2  -  3  3  2.5   Symptoms  none  -  none  none  none   Duration  Continue with 45 min of aerobic exercise without signs/symptoms of physical distress.  -  Continue with 45 min of aerobic exercise without signs/symptoms of physical distress.  Continue with 45 min of aerobic exercise without signs/symptoms of physical distress.  Continue with 45 min of aerobic exercise without signs/symptoms of physical distress.   Intensity  THRR unchanged  -  THRR unchanged  THRR unchanged  THRR unchanged     Progression   Progression  Continue to progress workloads to maintain intensity without signs/symptoms of physical distress.  -  Continue to progress workloads to maintain intensity without signs/symptoms of physical distress.  Continue to progress workloads to maintain intensity without signs/symptoms of physical distress.  Continue to progress workloads to maintain intensity without signs/symptoms of physical distress.   Average METs  2.6  -  2.5  2.2  2.2     Resistance Training   Training Prescription  Yes  -  Yes  Yes  Yes   Bridges  3 lb  -  4 lb  4 lb  4 lb   Reps  10-15  -  10-15  10-15  10-15     Interval Training   Interval Training  No  -  -  -  No     Treadmill   MPH  1.5  -  1.8  1.7  1.6   Grade  0  -  0  0  0   Minutes  15  -  '15  15  15   ' METs  2.15  -  2.38  2.3  2.23     NuStep   Level  4  -  4  -  4   SPM  95  -  98  -  98   Minutes  15  -  15  -  15   METs  3.1  -  3  -  2.1     Arm Ergometer   Level  -  -  2  2  -   Minutes  -  -  15  15  -   METs  -  -  2.2   2.1  -     Home Exercise Plan   Plans to continue exercise at  -  Home (comment) at his appartment complex  Home (comment) at his appartment complex  -  Home (comment) at his appartment complex   Frequency  -  Add 1 additional day to program exercise sessions.  Add 1 additional day to program exercise sessions.  -  Add 1 additional day to program exercise sessions.   Initial Home Exercises Provided  -  05/03/17  05/03/17  -  05/03/17   Row Name 08/30/17 1400 09/13/17 1300 09/27/17 1200         Response to Exercise   Blood Pressure (Admit)  140/72  124/68  162/80     Blood Pressure (Exit)  126/64  124/56  126/56     Heart Rate (Admit)  103 bpm  104 bpm  95 bpm     Heart Rate (Exercise)  125 bpm  120 bpm  114 bpm     Heart Rate (Exit)  103 bpm  104 bpm  85 bpm  Oxygen Saturation (Admit)  94 %  96 %  95 %     Oxygen Saturation (Exercise)  97 %  96 %  95 %     Oxygen Saturation (Exit)  97 %  94 %  96 %     Rating of Perceived Exertion (Exercise)  '13  12  13     ' Perceived Dyspnea (Exercise)  '2  3  3     ' Symptoms  none  none  none     Duration  Continue with 45 min of aerobic exercise without signs/symptoms of physical distress.  Continue with 45 min of aerobic exercise without signs/symptoms of physical distress.  Continue with 45 min of aerobic exercise without signs/symptoms of physical distress.     Intensity  THRR unchanged  THRR unchanged  THRR unchanged       Progression   Progression  Continue to progress workloads to maintain intensity without signs/symptoms of physical distress.  Continue to progress workloads to maintain intensity without signs/symptoms of physical distress.  Continue to progress workloads to maintain intensity without signs/symptoms of physical distress.     Average METs  2.3  2  2.1       Resistance Training   Training Prescription  -  Yes  Yes     Bridges  -  4 lb  4 lb     Reps  -  10-15  10-15       Interval Training   Interval Training  No  No  No        Treadmill   MPH  1.4  1.5  1.5     Grade  0  0.5  0     Minutes  '15  15  15     ' METs  2.07  2.25  2.1       NuStep   Level  -  5  2     SPM  -  67  -     Minutes  -  15  15     METs  -  1.6  -       Arm Ergometer   Level  2.6  -  1.3     Minutes  15  -  15     METs  2.1  -  2.1       Home Exercise Plan   Plans to continue exercise at  Home (comment) at his appartment complex  -  Home (comment) at his appartment complex     Frequency  Add 1 additional day to program exercise sessions.  -  Add 1 additional day to program exercise sessions.     Initial Home Exercises Provided  05/03/17  -  05/03/17        Exercise Comments: Exercise Comments    Row Name 04/10/17 1046 05/03/17 1453 10/06/17 1106       Exercise Comments  First full day of exercise!  Patient was oriented to gym and equipment including functions, settings, policies, and procedures.  Patient's individual exercise prescription and treatment plan were reviewed.  All starting workloads were established based on the results of the 6 minute walk test done at initial orientation visit.  The plan for exercise progression was also introduced and progression will be customized based on patient's performance and goals.  Reviewed home exercise with pt today.  Pt plans to use the fitness center at his appartment for exercise.  Reviewed THR, pulse, RPE, sign and symptoms,  NTG use, and when to call 911 or MD.  Also discussed weather considerations and indoor options.  Pt voiced understanding.  Echo graduated today from  rehab with 36 sessions completed.  Details of the patient's exercise prescription and what He needs to do in order to continue the prescription and progress were discussed with patient.  Patient was given a copy of prescription and goals.  Patient verbalized understanding.  Danthony plans to continue to exercise by attending his gym at his apartment complex.        Exercise Goals and Review:   Exercise Goals  Re-Evaluation : Exercise Goals Re-Evaluation    Row Name 04/12/17 1504 04/26/17 1544 05/03/17 1454 05/10/17 1058 05/24/17 1454     Exercise Goal Re-Evaluation   Exercise Goals Review  Increase Physical Activity;Increase Strength and Stamina  Increase Physical Activity;Increase Strength and Stamina  Increase Physical Activity;Increase Strength and Stamina;Able to understand and use rate of perceived exertion (RPE) scale;Able to check pulse independently;Knowledge and understanding of Target Heart Rate Range (THRR);Understanding of Exercise Prescription;Able to understand and use Dyspnea scale  Increase Physical Activity;Increase Strength and Stamina;Understanding of Exercise Prescription  Increase Physical Activity;Increase Strength and Stamina   Comments  Cody Bridges has tolerated exercise well.  He had some muscle soreness after his first session.  Cody Bridges has tolerated exercise well.  He needs to attend 2-3 days per week to improve.  Staff will remind him to attend regularly.  Reviewed home exercise with pt today.  Pt plans to use the fitness center at his appartment for exercise.  Reviewed THR, pulse, RPE, sign and symptoms, NTG use, and when to call 911 or MD.  Also discussed weather considerations and indoor options.  Pt voiced understanding.  Cody Bridges has sporadic attendance at class.  He would see better results with regular attendance 2-3 times per week.  Staff will remind Cody Bridges of the benefits of regular exercise.  Cody Bridges has been out of town.  he would benefit from more frequent attendance.  Staff reviewed the benefits of exercising 3 times per week.   Expected Outcomes  Short - Cody Bollard will continue to attend regularly.  Long - Cody Bridges will progress with exercise.  Short - Cody Bridges will attend 2-3 days per week.  Long - Cody Bridges will see imporvement in overall MET and fitness level.  Short: add 1 day of exercise to his daily routine. Long: Increase number of days for exercise.  Short - Cody Bridges will attend at leat 2 times per  week.  Long - Cody Bridges will progress more with regular attendance.  Short - Cody Bridges will attend at least 2 x per week. Long - Cody Bridges will progress more with exercise.   Cody Bridges Name 06/07/17 1302 06/21/17 1039 07/05/17 1237 08/16/17 1548 08/30/17 1459     Exercise Goal Re-Evaluation   Exercise Goals Review  Increase Physical Activity;Increase Strength and Stamina  Increase Physical Activity;Increase Strength and Stamina;Able to understand and use rate of perceived exertion (RPE) scale;Able to understand and use Dyspnea scale  Increase Physical Activity;Increase Strength and Stamina  Increase Physical Activity;Increase Strength and Stamina;Able to understand and use Dyspnea scale;Able to understand and use rate of perceived exertion (RPE) scale  Increase Physical Activity;Increase Strength and Stamina;Able to understand and use rate of perceived exertion (RPE) scale   Comments  Cody Bridges is tolerating exercise well.  Staff will continue to monitor.  Cody Bridges is tolerating exercise well.  He would see better progresion with regular attendance.  He has been travelling frequently.  Today was pt first day back after a vacation of 3 weeks.  He reduced speed on TM for today.    Cody Bridges travels frequently and has not attended class regularly.  He would benefit from more consistent attendance or exercising on his own while not in class.  Cody Bridges has attended 2 days per week the last two weeks.  Regular attendance will help him improve overall MET level.   Expected Outcomes  Short - Cody Bridges will exercise regularly even when travelling.  Long - Cody Bridges will maintain exercise on his own.  Short - Cody Bridges will attend LW at least twice per week.  Long - Cody Bridges will exercise regularly.  Short - Cody Bridges will build to former levels Long - Cody Bridges will continue to progress MET level  Short - Cody Bridges will attend more frequently and exercise on his own while away [place] will maintain exercise on his own  Short - Cody Bridges will complete rehab Long - Cody Bridges will maintain exercise on his own   [place]  Name 09/13/17 1338 09/27/17 1206           Exercise Goal Re-Evaluation   Exercise Goals Review  Increase Physical Activity;Increase Strength and Stamina;Able to understand and use Dyspnea scale;Able to understand and use rate of perceived exertion (RPE) scale  Increase Physical Activity;Increase Strength and Stamina;Able to understand and use rate of perceived exertion (RPE) scale      Comments  Cody Bridges has been more consistent attending class.  He has moved speed and grade up on TM and level on NS.  Staff will continue to monitor  Meghan had attended regularly in February.  Staff will encourage him to move back to L5 on NS and add incline on TM.      Expected Outcomes  Short - Cody Bridges will keep speed up on NS when resistnace increases Long - Cody Bridges will improve CV endurance  Short - Cody Bridges will attend regularly until he graduates Long - Cody Bridges will maintain fitness on his own         Discharge Exercise Prescription (Final Exercise Prescription Changes): Exercise Prescription Changes - 09/27/17 1200      Response to Exercise   Blood Pressure (Admit)  162/80    Blood Pressure (Exit)  126/56    Heart Rate (Admit)  95 bpm    Heart Rate (Exercise)  114 bpm    Heart Rate (Exit)  85 bpm    Oxygen Saturation (Admit)  95 %    Oxygen Saturation (Exercise)  95 %    Oxygen Saturation (Exit)  96 %    Rating of Perceived Exertion (Exercise)  13    Perceived Dyspnea (Exercise)  3    Symptoms  none    Duration  Continue with 45 min of aerobic exercise without signs/symptoms of physical distress.    Intensity  THRR unchanged      Progression   Progression  Continue to progress workloads to maintain intensity without signs/symptoms of physical distress.    Average METs  2.1      Resistance Training   Training Prescription  Yes    Bridges  4 lb    Reps  10-15      Interval Training   Interval Training  No      Treadmill   MPH  1.5    Grade  0    Minutes  15    METs  2.1      NuStep   Level  2  Minutes  15      Arm Ergometer   Level  1.3    Minutes  15    METs  2.1      Home Exercise Plan   Plans to continue exercise at  Home (comment) at his appartment complex    Frequency  Add 1 additional day to program exercise sessions.    Initial Home Exercises Provided  05/03/17       Nutrition:  Target Goals: Understanding of nutrition guidelines, daily intake of sodium <1574m, cholesterol <2048m calories 30% from fat and 7% or less from saturated fats, daily to have 5 or more servings of fruits and vegetables.  Biometrics:  Post Biometrics - 10/04/17 1038       Post  Biometrics   Height  5' 10.5" (1.791 m)    Bridges  215 lb (97.5 kg)    Waist Circumference  46 inches    Hip Circumference  44.5 inches    Waist to Hip Ratio  1.03 %    BMI (Calculated)  30.4       Nutrition Therapy Plan and Nutrition Goals: Nutrition Therapy & Goals - 05/29/17 1608      Nutrition Therapy   RD appointment deferred  Yes      Personal Nutrition Goals   Comments  patient declines to see the dietician       Nutrition Assessments: Nutrition Assessments - 09/29/17 1106      MEDFICTS Scores   Post Score  26       Nutrition Goals Re-Evaluation: Nutrition Goals Re-Evaluation    Cody Bridges 04/19/17 1403 05/15/17 1631 05/29/17 1608 08/18/17 1031 10/02/17 1103     Goals   Current Bridges  215 lb (97.5 kg)  217 lb (98.4 kg)  215 lb (97.5 kg)  215 lb (97.5 kg)  214 lb (97.1 kg)   Nutrition Goal  Patient declined to see the nutritionist  Lose Bridges and increase strength and stamina.  Eat healthier and lose Bridges  Eat healthier and lose Bridges  Eat healthier and lose Bridges   Comment  Cody Bridges will continued to be monitored and inform him that he can meet with a dietician is he wants to.  Cody Bridges not lost Bridges but [place] a [place].  Cody Bridges lost 2 pounds since his last weigh in. He has been vacationing and is  starting to not eat as much. He states that he is pickey with what he ears at times.  RoAshs at his Bridges when he started. He has been going on vacation and not watching his diet like he should. RoRippeonirlfriend has been making more healthier meals and trying to cut back on sodium.  RoChristrophertates that his girlfriend is trying to make healthier meals. His Bridges has been up and down but he does not work out when he goes on vacation frequently.   Expected Outcome  Short: lose Bridges using exercise. Long: Maintain weightloss with exercise at home.  Short: Lose a few pounds before his next vacation. Long; maintain a nutrition and weightloss program.  Short: eat a heart healthy diet. Long: Adhere to a diet plan.  Short: eat a healthier to lose Bridges. Long: maintain a healthy diet to maintain Bridges.  Short: eat less and lose Bridges. Long: maintain eating less and working out independently for Bridges loss.      Nutrition Goals Discharge (Final  Nutrition Goals Re-Evaluation): Nutrition Goals Re-Evaluation - 10/02/17 1103      Goals   Current Bridges  214 lb (97.1 kg)    Nutrition Goal  Eat healthier and lose Bridges    Comment  Wynton states that his girlfriend is trying to make healthier meals. His Bridges has been up and down but he does not work out when he goes on vacation frequently.    Expected Outcome  Short: eat less and lose Bridges. Long: maintain eating less and working out independently for Bridges loss.       Psychosocial: Target Goals: Acknowledge presence or absence of significant depression and/or stress, maximize coping skills, provide positive support system. Participant is able to verbalize types and ability to use techniques and skills needed for reducing stress and depression.   Initial Review & Psychosocial Screening:   Quality of Life Scores:  Scores of 19 and below usually indicate a poorer quality of life in these areas.  A difference of  2-3 points is a clinically  meaningful difference.  A difference of 2-3 points in the total score of the Quality of Life Index has been associated with significant improvement in overall quality of life, self-image, physical symptoms, and general health in studies assessing change in quality of life.  PHQ-9: Recent Review Flowsheet Data    Depression screen [facility] 2/9 09/29/2017 07/05/2017 03/28/2017 01/26/2017   Decreased Interest 0 2 0 0   Down, Depressed, Hopeless 0 0 0 0   PHQ - 2 Score 0 2 0 0   Altered sleeping 0 0 0 -   Tired, decreased energy '2 2 2 ' -   Change in appetite 0 1 2 -   Feeling bad or failure about yourself  0 0 0 -   Trouble concentrating 0 2 0 -   Moving slowly or fidgety/restless 0 0 0 -   Suicidal thoughts 0 0 0 -   PHQ-9 Score '2 7 4 ' -   Difficult doing work/chores Somewhat difficult Not difficult at all Somewhat difficult -     Interpretation of Total Score  Total Score Depression Severity:  1-4 = Minimal depression, 5-9 = Mild depression, 10-14 = Moderate depression, 15-19 = Moderately severe depression, 20-27 = Severe depression   Psychosocial Evaluation and Intervention: Psychosocial Evaluation - 04/10/17 1100      Psychosocial Evaluation & Interventions   Interventions  Encouraged to exercise with the program and follow exercise prescription;Stress management education    Comments  Counselor met with Cody. Wilmeth Cody Bridges) today for initial psychosocial evaluation.  He is an 84 year old who loves to travel.  He has some health issues with Asthma and Emphysema that impact that somewhat.  Cody Bridges has a strong support system with a significant other of (6) years; and daughter who lives close by; a sister-in-law locally; and active involvement in his local church; [employer] and a 36 year [employer].  Cody Bridges reports sleeping well and having a good appetite.  He denies a history of depression or anxiety or any current symptoms.  Cody Bridges states he is typically in a positive mood and other than his health issues - he has  minimal stress in his life.  Cody Bridges has goals to improve his breathing and learn better ways to manage his illness while in this program.  He will be followed by staff.      Expected Outcomes  Cody Bridges will benefit from consistent exercise to achieve his stated goals.  The educational and psychoeducational components  of this program will help Cody Bridges learn to manage and cope with his illnesses better.         Psychosocial Re-Evaluation: Psychosocial Re-Evaluation    Row Name 04/19/17 1409 05/15/17 1635 05/29/17 1626 08/18/17 1036 10/02/17 1104     Psychosocial Re-Evaluation   Current issues with  None Identified;Current Stress Concerns  None Identified;Current Stress Concerns  Current Stress Concerns  Current Stress Concerns  None Identified   Comments  Zyere states that his home life is great. He wants to be able to breath better and feels like the program is helping him to do so. He said he eats, sleeps, watches TV and says his relationship is great. He is traveling to [place] next month and the [place] in November.  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 He states his relationship with his girlfriend is going very well and they have been togehter for 6 years. His shortness of breath and his chronic illness seems to be his only stressor.  River has felt more short of breath this past week. He states his home life is great. He and his girlfriend do everything together. He states that his daughter likes his girlfriend and lights up when he talked about it.  Anthon has to get eye surgery on January 22nd. He will miss one day of class. He is ready to attend [facility] more regularly.  Jarvin states that everything at home is the best that it can be. He states he has no stress in his life at this time.    Expected Outcomes  Short: exercise to improve shortness breath to minimize stress. Long: Maintain a stress free environment at home and with his breathing.  Short:  attend [facility] regularly to improve stress. Long: Maintain a workout regimine to minimize stress   Short: continue rehab. Long: continue with a positive outlook post [facility].  Short: return to [place] after eye surgery. Long: maintain his eyes post surgery.  Short: continue to exercise outside of [facility] after graduation.  Long: maintain exercise independently after [facility] to keep stress at a minimum.   Interventions  Encouraged to attend Pulmonary Rehabilitation for the exercise;Stress management education  Encouraged to attend Pulmonary Rehabilitation for the exercise  Encouraged to attend Pulmonary Rehabilitation for the exercise  Encouraged to attend Pulmonary Rehabilitation for the exercise  Encouraged to attend Pulmonary Rehabilitation for the exercise   Continue Psychosocial Services   Follow up required by staff  Follow up required by staff  Follow up required by staff  Follow up required by staff  Follow up required by staff      Psychosocial Discharge (Final Psychosocial Re-Evaluation): Psychosocial Re-Evaluation - 10/02/17 1104      Psychosocial Re-Evaluation   Current issues with  None Identified    Comments  Azar states that everything at home is the best that it can be. He states he has no stress in his life at this time.     Expected Outcomes  Short: continue to exercise outside of [facility] after graduation.  Long: maintain exercise independently after [facility] to keep stress at a minimum.    Interventions  Encouraged to attend Pulmonary Rehabilitation for the exercise    Continue Psychosocial Services   Follow up required by staff       Education: Education Goals: Education classes will be provided on a weekly basis, covering required topics. Participant will state understanding/return demonstration of topics presented.  Learning Barriers/Preferences:  Education Topics:  Initial Evaluation Education: - Verbal, written and demonstration of respiratory meds,  oximetry and breathing techniques. Instruction on use of nebulizers and MDIs and importance of monitoring MDI activations.   Pulmonary Rehab from 10/04/2017 in [facility] Cardiac and Pulmonary Rehab  Date  03/28/17  Educator  Sb  Instruction Review Code  1- Verbalizes Understanding      General Nutrition Guidelines/Fats and Fiber: -Group instruction provided by verbal, written material, models and posters to present the general guidelines for heart healthy nutrition. Gives an explanation and review of dietary fats and fiber.   Pulmonary Rehab from 10/04/2017 in [facility] Cardiac and Pulmonary Rehab  Date  08/28/17  Educator  CR  Instruction Review Code  1- Verbalizes Understanding      Controlling Sodium/Reading Food Labels: -Group verbal and written material supporting the discussion of sodium use in heart healthy nutrition. Review and explanation with models, verbal and written materials for utilization of the food label.   Pulmonary Rehab from 10/04/2017 in [facility] Cardiac and Pulmonary Rehab  Date  09/04/17  Educator  CR  Instruction Review Code  1- Verbalizes Understanding      Exercise Physiology & General Exercise Guidelines: - Group verbal and written instruction with models to review the exercise physiology of the cardiovascular system and associated critical values. Provides general exercise guidelines with specific guidelines to those with heart or lung disease.    Aerobic Exercise & Resistance Training: - Gives group verbal and written instruction on the various components of exercise. Focuses on aerobic and resistive training programs and the benefits of this training and how to safely progress through these programs.   Pulmonary Rehab from 10/04/2017 in [facility] Cardiac and Pulmonary Rehab  Date  08/18/17  Educator  AS  Instruction Review Code  1- Verbalizes Understanding      Flexibility, Balance, Mind/Body Relaxation: Provides group verbal/written instruction on the benefits of  flexibility and balance training, including mind/body exercise modes such as yoga, pilates and tai chi.  Demonstration and skill practice provided.   Pulmonary Rehab from 10/04/2017 in [facility] Cardiac and Pulmonary Rehab  Date  09/13/17  Educator  AS  Instruction Review Code  1- Verbalizes Understanding      Stress and Anxiety: - Provides group verbal and written instruction about the health risks of elevated stress and causes of high stress.  Discuss the correlation between heart/lung disease and anxiety and treatment options. Review healthy ways to manage with stress and anxiety.   Pulmonary Rehab from 10/04/2017 in [facility] Cardiac and Pulmonary Rehab  Date  10/04/17  Educator  [facility]  Instruction Review Code  5- Refused Teaching      Depression: - Provides group verbal and written instruction on the correlation between heart/lung disease and depressed mood, treatment options, and the stigmas associated with seeking treatment.   Exercise & Equipment Safety: - Individual verbal instruction and demonstration of equipment use and safety with use of the equipment.   Infection Prevention: - Provides verbal and written material to individual with discussion of infection control including proper hand washing and proper equipment cleaning during exercise session.   Pulmonary Rehab from 10/04/2017 in [facility] Cardiac and Pulmonary Rehab  Date  03/28/17  Educator  Sb  Instruction Review Code  1- Verbalizes Understanding      Falls Prevention: - Provides verbal and written material to individual with discussion of falls prevention and safety.   Pulmonary Rehab from 10/04/2017 in [facility] Cardiac and Pulmonary Rehab  Date  03/28/17  Educator  SB  Instruction Review Code  1- Verbalizes Understanding      Diabetes: - Individual verbal and written instruction to review signs/symptoms of diabetes, desired ranges of glucose level fasting, after meals and with exercise. Advice that pre and post exercise glucose  checks will be done for 3 sessions at entry of program.   Pulmonary Rehab from 10/04/2017 in [facility] Cardiac and Pulmonary Rehab  Date  03/28/17  Educator  Sb  Instruction Review Code  1- Verbalizes Understanding      Chronic Lung Diseases: - Group verbal and written instruction to review updates, respiratory medications, advancements in procedures and treatments. Discuss use of supplemental oxygen including available portable oxygen systems, continuous and intermittent flow rates, concentrators, personal use and safety guidelines. Review proper use of inhaler and spacers. Provide informative websites for self-education.    Pulmonary Rehab from 10/04/2017 in [facility] Cardiac and Pulmonary Rehab  Date  05/03/17  Educator  [facility]  Instruction Review Code  1- Verbalizes Understanding      Energy Conservation: - Provide group verbal and written instruction for methods to conserve energy, plan and organize activities. Instruct on pacing techniques, use of adaptive equipment and posture/positioning to relieve shortness of breath.   Pulmonary Rehab from 10/04/2017 in [facility] Cardiac and Pulmonary Rehab  Date  04/12/17  Educator  [facility]  Instruction Review Code  1- Verbalizes Understanding      Triggers and Exacerbations: - Group verbal and written instruction to review types of environmental triggers and ways to prevent exacerbations. Discuss weather changes, air quality and the benefits of nasal washing. Review warning signs and symptoms to help prevent infections. Discuss techniques for effective airway clearance, coughing, and vibrations.   Pulmonary Rehab from 10/04/2017 in [facility] Cardiac and Pulmonary Rehab  Date  08/16/17  Educator  [facility]  Instruction Review Code  1- Verbalizes Understanding      AED/CPR: - Group verbal and written instruction with the use of models to demonstrate the basic use of the AED with the basic ABC's of resuscitation.   Pulmonary Rehab from 10/04/2017 in [facility] Cardiac and Pulmonary Rehab   Date  09/29/17  Educator  [facility]  Instruction Review Code  1- Actuary and Physiology of the Lungs: - Group verbal and written instruction with the use of models to provide basic lung anatomy and physiology related to function, structure and complications of lung disease.   Pulmonary Rehab from 10/04/2017 in [facility] Cardiac and Pulmonary Rehab  Date  07/05/17  Educator  [facility]  Instruction Review Code  1- Verbalizes Understanding      Anatomy & Physiology of the Heart: - Group verbal and written instruction and models provide basic cardiac anatomy and physiology, with the coronary electrical and arterial systems. Review of Valvular disease and Heart Failure   Pulmonary Rehab from 10/04/2017 in [facility] Cardiac and Pulmonary Rehab  Date  08/30/17  Educator  [facility]  Instruction Review Code  1- Verbalizes Understanding      Cardiac Medications: - Group verbal and written instruction to review commonly prescribed medications for heart disease. Reviews the medication, class of the drug, and side effects.   Pulmonary Rehab from 10/04/2017 in [facility] Cardiac and Pulmonary Rehab  Date  09/15/17  Educator  [facility]  Instruction Review Code  5- Refused Teaching      Know Your Numbers and Risk Factors: -Group verbal and written instruction about important numbers in your health.  Discussion of what are risk  factors and how they play a role in the disease process.  Review of Cholesterol, Blood Pressure, Diabetes, and BMI and the role they play in your overall health.   Sleep Hygiene: -Provides group verbal and written instruction about how sleep can affect your health.  Define sleep hygiene, discuss sleep cycles and impact of sleep habits. Review good sleep hygiene tips.    Other: -Provides group and verbal instruction on various topics (see comments)    Knowledge Questionnaire Score: Knowledge Questionnaire Score - 09/29/17 1102      Knowledge Questionnaire Score   Pre Score  6/10     Post Score  8/10 reviewed results with pt today, new test 14/18        Core Components/Risk Factors/Patient Goals at Admission:   Core Components/Risk Factors/Patient Goals Review:  Goals and Risk Factor Review    Row Name 04/19/17 1355 05/15/17 1622 05/29/17 1638 08/18/17 1022 10/02/17 1055     Core Components/Risk Factors/Patient Goals Review   Personal Goals Review  Bridges Management/Obesity;Lipids;Hypertension;Diabetes  Bridges Management/Obesity;Improve shortness of breath with ADL's;Hypertension;Diabetes;Lipids;Increase knowledge of respiratory medications and ability to use respiratory devices properly.  Bridges Management/Obesity;Improve shortness of breath with ADL's;Stress;Diabetes  Bridges Management/Obesity;Improve shortness of breath with ADL's;Stress;Diabetes  Bridges Management/Obesity;Improve shortness of breath with ADL's;Diabetes   Review  Beber Bridges has been steady and wants to lose Bridges. He has not met with the nutritionist. His blood pressure has been under control since the start of the program. He does not know his last lipids results. Blood sugars have been checked and within acceptable ranges. He also checks his blood sugar regularly at home.  Chioke states he has been felling better and breathing a little easier. He takes his albuterol and his inhalers as prescribed but has no interest in using a spacer. His blood pressure has been stable since the start of the program. His Bridges has increase slightyl due to vacations he has been going on and has scheduled.  Hoke would like to lose some Bridges and improve his shortness of breath. His stress is very minimal. His diabetes has been under control and checks regularly at home.  avaneesh pepitone been taking his blood sugar medication but has not been checking it recently. He has a procedure on the 22nd for his eyes. He is still getting short of breath at home. He has missed a lot of days in [facility] due to [place].  He still  wants to lose Bridges and he was informed the more he comes regularly the more Bridges he will lose.  Zayyan has been trying to lose Bridges and states he has been eating healthier. He checks his blood sugar at home and he states it has been good. He states his shortness of breath comes and goes when he is doing things around the house. Informed him to exercise after he graduates from [place] to keep his stamina up.   Expected Outcomes  Short: Continue to lose Bridges. Long: Maintain Bridges after Bridges loss   Short: convince Kerri to use a spacer with his inhaler. Long: Use spacer and take medications independently.  Short: attend [facility] regularly to lose Bridges. Long: maintain weightloss with an exercise routine.  Short: check blood sugar at home and attend [facility] regularly to lose Bridges. Long: maintain weightloss with an exercise routine. Check blood sugar routinely.  Short: Exercise outside of the program to lose Bridges. Long: maintain exercise to keep Bridges off      Core Components/Risk Factors/Patient Goals at  Discharge (Final Review):  Goals and Risk Factor Review - 10/02/17 1055      Core Components/Risk Factors/Patient Goals Review   Personal Goals Review  Bridges Management/Obesity;Improve shortness of breath with ADL's;Diabetes    Review  Alvey has been trying to lose Bridges and states he has been eating healthier. He checks his blood sugar at home and he states it has been good. He states his shortness of breath comes and goes when he is doing things around the house. Informed him to exercise after he graduates from [place] to keep his stamina up.    Expected Outcomes  Short: Exercise outside of the program to lose Bridges. Long: maintain exercise to keep Bridges off       ITP Comments: ITP Comments    Row Name 04/24/17 0831 04/26/17 1015 05/03/17 1221 05/22/17 0823 06/02/17 1011   ITP Comments  30 day review completed. ITP sent to Dr. Emily Filbert Director of [place]. Continue  with ITP unless changes are made by physician.    Christion was absent today and could not see Dr. Emily Filbert for face to face and chart review.  Gabor seen Dr. Lequita Halt who signed for Dr. Emily Filbert Director of [place] for face to face and chart review  30 day review completed. ITP sent to Dr. Emily Filbert Director of [place]. Continue with ITP unless changes are made by physician.    Chan was not in [place] today to meet face to face with Dr. Sabra Heck Director of [place].   [place] Name 06/05/17 1039 06/19/17 0835 07/04/17 1004 07/05/17 1102 07/17/17 0822   ITP Comments  Met Dr. Sabra Heck director of [place] face to face for chart signature and review.  30 day review completed. ITP sent to Dr. Emily Filbert Director of [place]. Continue with ITP unless changes are made by physician.    Called Wilkins to see how he was doing. He was on vacation and plans to return tomorrow.  Herbie Baltimore met face to face with Dr. Emily Filbert director of [place], chart reviewed and signed  Unable to obtain goals due to lack off attendance   Row Name 07/17/17 0823 07/19/17 1355 08/04/17 0936 08/14/17 0824 08/14/17 0825   ITP Comments  30 day review completed. ITP sent to Dr. Emily Filbert Director of [place]. Continue with ITP unless changes are made by physician.    Cody Bridges has only attended once in December due to scheduled trips.  Rickie is going out of town and has been busy with the holidays. He wants to resume when he gets back from his cruise and states he will try to be back August 14 2017.  goals not obtained patient has not attended [facility] in over a month  30 day review completed. ITP sent to Dr. Emily Filbert Director of [place]. Continue with ITP unless changes are made by physician.   [place] Name 08/14/17 1119 09/11/17 0922 09/15/17 1019 10/02/17 1053     ITP Comments  Met face to face with Dr. Ramonita Lab for Dr. Emily Filbert director of [place]. Chart reviewed and signed.  30 day review completed. ITP sent to Dr. Emily Filbert Director of [place]. Continue with ITP unless changes are made by physician.  Herbie Baltimore Hubka met face to face with Dr. Emily Filbert Director of [place]. Chart reviewed and signed.   Cody Bridges has alot of appointments coming up and goes on vacation regularly. He is going to do his 6 minute walk next time he attends [facility] and will be  graduated after that session. Patient verbalizes understanding.        Comments: Discharge ITP

## 2017-10-06 NOTE — Patient Instructions (Signed)
Discharge Patient Instructions  Patient Details  Name: Cody Bridges MRN: [id number] Date of Birth: 1933-04-01 Referring Provider:  Leone Haven, MD   Number of Visits: 33/36  Reason for Discharge:  Patient reached a stable level of exercise. Patient independent in their exercise. Patient has met program and personal goals.  Smoking History:  Social History   Tobacco Use  Smoking Status Former Smoker  . Packs/day: 2.00  . Years: 35.00  . Pack years: 70.00  . Types: Cigarettes  . Last attempt to quit: 10/30/1985  . Years since quitting: 31.9  Smokeless Tobacco Never Used    Diagnosis:  No diagnosis found.  Initial Exercise Prescription:   Discharge Exercise Prescription (Final Exercise Prescription Changes): Exercise Prescription Changes - 09/27/17 1200      Response to Exercise   Blood Pressure (Admit)  162/80    Blood Pressure (Exit)  126/56    Heart Rate (Admit)  95 bpm    Heart Rate (Exercise)  114 bpm    Heart Rate (Exit)  85 bpm    Oxygen Saturation (Admit)  95 %    Oxygen Saturation (Exercise)  95 %    Oxygen Saturation (Exit)  96 %    Rating of Perceived Exertion (Exercise)  13    Perceived Dyspnea (Exercise)  3    Symptoms  none    Duration  Continue with 45 min of aerobic exercise without signs/symptoms of physical distress.    Intensity  THRR unchanged      Progression   Progression  Continue to progress workloads to maintain intensity without signs/symptoms of physical distress.    Average METs  2.1      Resistance Training   Training Prescription  Yes    Weight  4 lb    Reps  10-15      Interval Training   Interval Training  No      Treadmill   MPH  1.5    Grade  0    Minutes  15    METs  2.1      NuStep   Level  2    Minutes  15      Arm Ergometer   Level  1.3    Minutes  15    METs  2.1      Home Exercise Plan   Plans to continue exercise at  Home (comment) at his appartment complex    Frequency  Add 1 additional day  to program exercise sessions.    Initial Home Exercises Provided  05/03/17       Functional Capacity: 6 Minute Walk    Row Name 10/04/17 1039         6 Minute Walk   Phase  Discharge     Distance  1045 feet     Distance % Change  10 %     Distance Feet Change  95 ft     Walk Time  6 minutes     # of Rest Breaks  0     MPH  1.98     METS  2.2     RPE  12     Perceived Dyspnea   3     VO2 Peak  7.75     Symptoms  Yes (comment)     Comments  back pain 1/10     Resting HR  98 bpm     Resting BP  124/64     Resting Oxygen Saturation  96 %     Exercise Oxygen Saturation  during 6 min walk  96 %     Max Ex. HR  138 bpm     Max Ex. BP  148/64     2 Minute Post BP  136/74       Interval HR   1 Minute HR  133     2 Minute HR  133     3 Minute HR  120     4 Minute HR  134     5 Minute HR  138     6 Minute HR  132     Interval Heart Rate?  Yes       Interval Oxygen   Interval Oxygen?  Yes     Baseline Oxygen Saturation %  95 %     1 Minute Oxygen Saturation %  96 %     1 Minute Liters of Oxygen  0 L     2 Minute Oxygen Saturation %  95 %     2 Minute Liters of Oxygen  0 L     3 Minute Oxygen Saturation %  96 %     3 Minute Liters of Oxygen  0 L     4 Minute Oxygen Saturation %  96 %     4 Minute Liters of Oxygen  0 L     5 Minute Oxygen Saturation %  96 %     5 Minute Liters of Oxygen  0 L     6 Minute Oxygen Saturation %  96 %     6 Minute Liters of Oxygen  0 L     2 Minute Post Oxygen Saturation %  98 %     2 Minute Post Liters of Oxygen  0 L        Quality of Life:   Personal Goals: Goals established at orientation with interventions provided to work toward goal.    Personal Goals Discharge: Goals and Risk Factor Review - 10/02/17 1055      Core Components/Risk Factors/Patient Goals Review   Personal Goals Review  Weight Management/Obesity;Improve shortness of breath with ADL's;Diabetes    Review  Axl has been trying to lose weight and states he  has been eating healthier. He checks his blood sugar at home and he states it has been good. He states his shortness of breath comes and goes when he is doing things around the house. Informed him to exercise after he graduates from [place] to keep his stamina up.    Expected Outcomes  Short: Exercise outside of the program to lose weight. Long: maintain exercise to keep weight off       Exercise Goals and Review:   Nutrition & Weight - Outcomes:  Post Biometrics - 10/04/17 1038       Post  Biometrics   Height  5' 10.5" (1.791 m)    Weight  215 lb (97.5 kg)    Waist Circumference  46 inches    Hip Circumference  44.5 inches    Waist to Hip Ratio  1.03 %    BMI (Calculated)  30.4       Nutrition: Nutrition Therapy & Goals - 05/29/17 1608      Nutrition Therapy   RD appointment deferred  Yes      Personal Nutrition Goals   Comments  patient declines to see the dietician       Nutrition Discharge: Nutrition Assessments - 09/29/17 1106  MEDFICTS Scores   Post Score  26       Education Questionnaire Score: Knowledge Questionnaire Score - 09/29/17 1102      Knowledge Questionnaire Score   Pre Score  6/10    Post Score  8/10 reviewed results with pt today, new test 14/18       Goals reviewed with patient; copy given to patient.

## 2017-10-06 NOTE — Progress Notes (Signed)
Discharge Progress Report  Patient Details  Name: Cody Bridges MRN: [id number] Date of Birth: May 27, 1933 Referring Provider:     Number of Visits: 34  Reason for Discharge:  Patient reached a stable level of exercise. Patient independent in their exercise. Patient has met program and personal goals.  Smoking History:  Social History   Tobacco Use  Smoking Status Former Smoker  . Packs/day: 2.00  . Years: 35.00  . Pack years: 70.00  . Types: Cigarettes  . Last attempt to quit: 10/30/1985  . Years since quitting: 31.9  Smokeless Tobacco Never Used    Diagnosis:  Chronic obstructive pulmonary disease, unspecified COPD type ([place])  ADL UCSD: Pulmonary Assessment Scores    Row Name 07/05/17 1052 09/29/17 1103 10/06/17 1100     ADL UCSD   ADL Phase  Mid  Exit  Exit   SOB Score total  49  68  -   Rest  1  0  -   Walk  3  2  -   Stairs  4  5  -   [place]ath  1  0  -   Dress  4  3  -   Shop  4  3  -     CAT Score   CAT Score  -  19  -     mMRC Score   mMRC Score  -  -  1      Initial Exercise Prescription:   Discharge Exercise Prescription (Final Exercise Prescription Changes): Exercise Prescription Changes - 09/27/17 1200      Response to Exercise   Blood Pressure (Admit)  162/80    Blood Pressure (Exit)  126/56    Heart Rate (Admit)  95 bpm    Heart Rate (Exercise)  114 bpm    Heart Rate (Exit)  85 bpm    Oxygen Saturation (Admit)  95 %    Oxygen Saturation (Exercise)  95 %    Oxygen Saturation (Exit)  96 %    Rating of Perceived Exertion (Exercise)  13    Perceived Dyspnea (Exercise)  3    Symptoms  none    Duration  Continue with 45 min of aerobic exercise without signs/symptoms of physical distress.    Intensity  THRR unchanged      Progression   Progression  Continue to progress workloads to maintain intensity without signs/symptoms of physical distress.    Average METs  2.1      Resistance Training   Training Prescription  Yes    Weight  4 lb     Reps  10-15      Interval Training   Interval Training  No      Treadmill   MPH  1.5    Grade  0    Minutes  15    METs  2.1      NuStep   Level  2    Minutes  15      Arm Ergometer   Level  1.3    Minutes  15    METs  2.1      Home Exercise Plan   Plans to continue exercise at  Home (comment) at his appartment complex    Frequency  Add 1 additional day to program exercise sessions.    Initial Home Exercises Provided  05/03/17       Functional Capacity: 6 Minute Walk    Row Name 10/04/17 1039  6 Minute Walk   Phase  Discharge     Distance  1045 feet     Distance % Change  10 %     Distance Feet Change  95 ft     Walk Time  6 minutes     # of Rest Breaks  0     MPH  1.98     METS  2.2     RPE  12     Perceived Dyspnea   3     VO2 Peak  7.75     Symptoms  Yes (comment)     Comments  back pain 1/10     Resting HR  98 bpm     Resting BP  124/64     Resting Oxygen Saturation   96 %     Exercise Oxygen Saturation  during 6 min walk  96 %     Max Ex. HR  138 bpm     Max Ex. BP  148/64     2 Minute Post BP  136/74       Interval HR   1 Minute HR  133     2 Minute HR  133     3 Minute HR  120     4 Minute HR  134     5 Minute HR  138     6 Minute HR  132     Interval Heart Rate?  Yes       Interval Oxygen   Interval Oxygen?  Yes     Baseline Oxygen Saturation %  95 %     1 Minute Oxygen Saturation %  96 %     1 Minute Liters of Oxygen  0 L     2 Minute Oxygen Saturation %  95 %     2 Minute Liters of Oxygen  0 L     3 Minute Oxygen Saturation %  96 %     3 Minute Liters of Oxygen  0 L     4 Minute Oxygen Saturation %  96 %     4 Minute Liters of Oxygen  0 L     5 Minute Oxygen Saturation %  96 %     5 Minute Liters of Oxygen  0 L     6 Minute Oxygen Saturation %  96 %     6 Minute Liters of Oxygen  0 L     2 Minute Post Oxygen Saturation %  98 %     2 Minute Post Liters of Oxygen  0 L        Psychological, QOL, Others - Outcomes: PHQ  2/9: Depression screen [facility] 2/9 09/29/2017 07/05/2017 03/28/2017 01/26/2017  Decreased Interest 0 2 0 0  Down, Depressed, Hopeless 0 0 0 0  PHQ - 2 Score 0 2 0 0  Altered sleeping 0 0 0 -  Tired, decreased energy '2 2 2 ' -  Change in appetite 0 1 2 -  Feeling bad or failure about yourself  0 0 0 -  Trouble concentrating 0 2 0 -  Moving slowly or fidgety/restless 0 0 0 -  Suicidal thoughts 0 0 0 -  PHQ-9 Score '2 7 4 ' -  Difficult doing work/chores Somewhat difficult Not difficult at all Somewhat difficult -    Quality of Life:   Personal Goals: Goals established at orientation with interventions provided to work toward goal.    Personal Goals Discharge: Goals and  Risk Factor Review    Row Name 04/19/17 1355 05/15/17 1622 05/29/17 1638 08/18/17 1022 10/02/17 1055     Core Components/Risk Factors/Patient Goals Review   Personal Goals Review  Weight Management/Obesity;Lipids;Hypertension;Diabetes  Weight Management/Obesity;Improve shortness of breath with ADL's;Hypertension;Diabetes;Lipids;Increase knowledge of respiratory medications and ability to use respiratory devices properly.  Weight Management/Obesity;Improve shortness of breath with ADL's;Stress;Diabetes  Weight Management/Obesity;Improve shortness of breath with ADL's;Stress;Diabetes  Weight Management/Obesity;Improve shortness of breath with ADL's;Diabetes   Review  Cody Bridges weight has been steady and wants to lose weight. He has not met with the nutritionist. His blood pressure has been under control since the start of the program. He does not know his last lipids results. Blood sugars have been checked and within acceptable ranges. He also checks his blood sugar regularly at home.  Cody Bridges states he has been felling better and breathing a little easier. He takes his albuterol and his inhalers as prescribed but has no interest in using a spacer. His blood pressure has been stable since the start of the program. His weight has increase  slightyl due to vacations he has been going on and has scheduled.  Cody Bridges would like to lose some weight and improve his shortness of breath. His stress is very minimal. His diabetes has been under control and checks regularly at home.  Cody Bridges been taking his blood sugar medication but has not been checking it recently. He has a procedure on the 22nd for his eyes. He is still getting short of breath at home. He has missed a lot of days in [facility] due to [place].  He still wants to lose weight and he was informed the more he comes regularly the more weight he will lose.  Cody Bridges has been trying to lose weight and states he has been eating healthier. He checks his blood sugar at home and he states it has been good. He states his shortness of breath comes and goes when he is doing things around the house. Informed him to exercise after he graduates from [place] to keep his stamina up.   Expected Outcomes  Short: Continue to lose weight. Long: Maintain weight after weight loss   Short: convince Adolphe to use a spacer with his inhaler. Long: Use spacer and take medications independently.  Short: attend [facility] regularly to lose weight. Long: maintain weightloss with an exercise routine.  Short: check blood sugar at home and attend [facility] regularly to lose weight. Long: maintain weightloss with an exercise routine. Check blood sugar routinely.  Short: Exercise outside of the program to lose weight. Long: maintain exercise to keep weight off      Exercise Goals and Review:   Nutrition & Weight - Outcomes:  Post Biometrics - 10/04/17 1038       Post  Biometrics   Height  5' 10.5" (1.791 m)    Weight  215 lb (97.5 kg)    Waist Circumference  46 inches    Hip Circumference  44.5 inches    Waist to Hip Ratio  1.03 %    BMI (Calculated)  30.4       Nutrition: Nutrition Therapy & Goals - 05/29/17 1608      Nutrition Therapy   RD appointment deferred  Yes      Personal Nutrition Goals    Comments  patient declines to see the dietician       Nutrition Discharge: Nutrition Assessments - 09/29/17 1106      MEDFICTS Scores   Post Score  26       Education Questionnaire Score: Knowledge Questionnaire Score - 09/29/17 1102      Knowledge Questionnaire Score   Pre Score  6/10    Post Score  8/10 reviewed results with pt today, new test 14/18       Goals reviewed with patient; copy given to patient.

## 2017-10-06 NOTE — Progress Notes (Signed)
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  -Continue Trilogy inhaler. - Continue activity, he is completing pulmonary rehab, encouraged him to continue in the continuation phase of pulmonary rehab.   Pulmonary fibrosis.  -Seen on PFT, appears mild.   Lung Nodule.  --LUL shrinking nodule, likely inflammatory.  Left lower lobe nodule has been stable for nearly 1 year.  No need for further follow-up. --PET scan 12/29/2016, 1.7 cm left upper lobe nodule SUV equals 3.3>> ENB 02/09/2017 negative>> repeat CT 02/22/2017 decrease in size nodule to 8 mm, needle biopsy deferred    Date: 10/06/2017  MRN# [id number] Cody Bridges 05-Aug-1932  Referring Physician:   Kevin Fenton Cody Bridges is a 84 y.o. old male seen in consultation for chief complaint of:    Chief Complaint  Patient presents with  . Lung Lesion    Pt had f/u chest CT on 10/05/17. Pt states Pulm rehab has helped.  . Shortness of Breath    pt was changed from Anoro/Symbicort and he was to start Trelegy. Pt still has Symbicort that he has used.    HPI:  The patient is an 84 year old male with emphysema. Last visit it was noted that he had a 1.7 cm left upper lobe nodule. He was referred for an ENB bronchoscopy, which was negative on 02/09/2017. Subsequently, the patient was sent for a needle biopsy, CT-guided, the left upper lobe nodule. However, on the scans at that time he was noted that the nodule had decreased in size to 8 mm, therefore, the biopsy was deferred.  He has continued to be dyspneic with mild activity.  He is now on Trilogy inhaler.  He has gone through pulmonary rehab and is now completing.  He feels that this has helped his breathing considerably.  He has no new complaints today.   **Images personally reviewed, CT chest 10/05/17, and comparison with previous on 02/22/17, previously seen nodule left upper lobe has nearly disappeared.  Unchanged left lower  lobe nodule which has not changed since May 2018.Cody Bridges  Ct chest hi-res 12/19/16; this shows fairly mild bibasilar ILD. More significantly there is a 2 cm irregular nodule in LUL, there is also mild emphysema.   *PFT *Ratio=59% Fev=53% with reversility.  Minimal air trapping.  DLCO=92%.    desat walk 10/25/16; Baseline sat on RA at rest 97% and HR 85. After walking 360 feet sat was 95% and HR 95; mild dyspnea.    Medication:   Reviewed.    Allergies:  Clindamycin and Penicillin v potassium  Review of Systems: Gen:  Denies  fever, sweats, chills HEENT: Denies blurred vision, double vision. bleeds, sore throat Cvc:  No dizziness, chest pain. Resp:   Denies cough or sputum production,  Gi: Denies swallowing difficulty, stomach pain. Gu:  Denies bladder incontinence, burning urine Ext:   No Joint pain, stiffness. Skin: No skin rash,  hives  Endoc:  No polyuria, polydipsia. Psych: No depression, insomnia. Other:  All other systems were reviewed with the patient and were negative other that what is mentioned in the HPI.   Physical Examination:   VS: BP (!) 110/56 (BP Location: Left Arm, Cuff Size: Large)   Pulse 67   Resp 16   Ht 5' 10.5" (1.791 m)   Wt 214 lb (97.1 kg)   SpO2 96%   BMI 30.27 kg/m   General Appearance: No distress  Neuro:without focal findings,  speech normal,  HEENT: PERRLA,  EOM intact.   Pulmonary: normal breath sounds, No wheezing. Decreased air entry.  CardiovascularNormal S1,S2.  No m/r/g.   Abdomen: Benign, Soft, non-tender. Renal:  No costovertebral tenderness  GU:  No performed at this time. Endoc: No evident thyromegaly, no signs of acromegaly. Skin:   warm, no rashes, no ecchymosis  Extremities: normal, no cyanosis, clubbing.  Other findings:    LABORATORY PANEL:   CBC No results for input(s): WBC, HGB, HCT, PLT in the last 168  hours. ------------------------------------------------------------------------------------------------------------------  Chemistries  No results for input(s): NA, K, CL, CO2, GLUCOSE, BUN, CREATININE, CALCIUM, MG, AST, ALT, ALKPHOS, BILITOT in the last 168 hours.  Invalid input(s): GFRCGP ------------------------------------------------------------------------------------------------------------------  Cardiac Enzymes No results for input(s): TROPONINI in the last 168 hours. ------------------------------------------------------------  RADIOLOGY:  Ct Chest Wo Contrast  Result Date: 10/05/2017 CLINICAL DATA:  History of lung nodule, follow-up, some shortness of breath, asthma, history of smoking EXAM: CT CHEST WITHOUT CONTRAST TECHNIQUE: Multidetector CT imaging of the chest was performed following the standard protocol without IV contrast. COMPARISON:  CT chest of 12/19/2016 and PET-CT of 12/29/2016 FINDINGS: Cardiovascular: Mild cardiomegaly is stable. No pericardial effusion is seen. Diffuse coronary artery calcifications are again noted. The mid ascending thoracic aorta measures 37 mm in diameter. Moderate thoracic aortic atherosclerosis is again noted. Mediastinum/Nodes: The previously noted mediastinal and hilar lymph nodes are stable. No adenopathy is seen. The thyroid gland is unremarkable. Lungs/Pleura: The previously identified nodule within the left upper lobe anteriorly has significantly decreased in size. This nodule now measures 7 x 11 mm compared to prior measurements of 20 x 21 mm. The somewhat elongated nodular lesion in the left lower lobe on image 110 series 3 is stable measuring 10 mm and most likely benign. No new pulmonary nodule is seen. No parenchymal infiltrate is noted and there is no evidence of pleural effusion. The central airway is patent. Upper Abdomen: No significant abnormality is seen within the upper abdomen. The peripheral cyst in the left upper kidney appears  stable measuring 2.5 cm compared to 2.7 cm previously. Musculoskeletal: The thoracic vertebrae are normal alignment with diffuse degenerative change. No compression deformity is seen. IMPRESSION: 1. Significant decrease in size of the irregular nodule within the anterior left upper lobe now measuring 7 x 11 mm compared to prior measurements of 20 x 21 mm. 2. Stable 10 mm nodular lesion in the left lower lobe. No new or enlarging pulmonary nodule is seen. 3. Diffuse coronary artery calcifications. Electronically Signed   By: Ivar Drape M.D.   On: 10/05/2017 14:44       Thank  you for the consultation and for allowing [facility] to assist in the care of your patient. Our recommendations are noted above.  Please contact us if we can be of further service.   Marda Stalker, MD.  Board Certified in Internal Medicine, Pulmonary Medicine, [place], and Sleep Medicine.  [facility] Office Number: [phone]  [name], M.D.  Merton Border, M.D  10/06/2017

## 2017-10-06 NOTE — Patient Instructions (Signed)
Recommend continue pulmonary rehab in the continuation phase. - Continue your inhaler daily, use her rescue inhaler when needed.

## 2017-10-06 NOTE — Progress Notes (Signed)
Daily Session Note  Patient Details  Name: Cody Bridges MRN: [id number] Date of Birth: 04-01-1933 Referring Provider:    Encounter Date: 10/06/2017  Check In: Session Check In - 10/06/17 1105      Check-In   Location  [facility]    Staff Present  Renita Papa, RN Vickki Hearing, BA, ACSM CEP, Exercise Physiologist;Mary Kellie Shropshire, RN, BSN, MA    Supervising physician immediately available to respond to emergencies  [facility] immediately available ER MD    Physician(s)   Dr. Reita Cliche and Archie Balboa    Medication changes reported      No    Fall or balance concerns reported     No    Warm-up and Cool-down  Performed as group-led instruction    Resistance Training Performed  Yes    VAD Patient?  No      Pain Assessment   Currently in Pain?  No/denies          Social History   Tobacco Use  Smoking Status Former Smoker  . Packs/day: 2.00  . Years: 35.00  . Pack years: 70.00  . Types: Cigarettes  . Last attempt to quit: 10/30/1985  . Years since quitting: 31.9  Smokeless Tobacco Never Used    Goals Met:  Proper associated with RPD/PD & O2 Sat Independence with exercise equipment Using PLB without cueing & demonstrates good technique Exercise tolerated well Strength training completed today  Goals Unmet:  Not Applicable  Comments:  Cody Bridges graduated today from  rehab with 34 sessions completed.  Details of the patient's exercise prescription and what He needs to do in order to continue the prescription and progress were discussed with patient.  Patient was given a copy of prescription and goals.  Patient verbalized understanding.  Cody Bridges plans to continue to exercise by attending his gym at his apartment complex.    Dr. Emily Filbert is Medical Director for Cody Bridges and [facility].

## 2017-10-26 DIAGNOSIS — L909 Atrophic disorder of skin, unspecified: Secondary | ICD-10-CM | POA: Diagnosis not present

## 2017-10-26 DIAGNOSIS — L2089 Other atopic dermatitis: Secondary | ICD-10-CM | POA: Diagnosis not present

## 2017-11-15 DIAGNOSIS — L2089 Other atopic dermatitis: Secondary | ICD-10-CM | POA: Diagnosis not present

## 2017-11-29 DIAGNOSIS — L2089 Other atopic dermatitis: Secondary | ICD-10-CM | POA: Diagnosis not present

## 2017-12-13 DIAGNOSIS — L2089 Other atopic dermatitis: Secondary | ICD-10-CM | POA: Diagnosis not present

## 2017-12-19 ENCOUNTER — Ambulatory Visit: Payer: Medicare Other | Admitting: Family Medicine

## 2017-12-19 ENCOUNTER — Telehealth: Payer: Self-pay | Admitting: Family Medicine

## 2017-12-19 NOTE — Telephone Encounter (Signed)
Patient declined to schedule AWV. SF 

## 2017-12-27 DIAGNOSIS — L2089 Other atopic dermatitis: Secondary | ICD-10-CM | POA: Diagnosis not present

## 2017-12-27 DIAGNOSIS — L503 Dermatographic urticaria: Secondary | ICD-10-CM | POA: Diagnosis not present

## 2017-12-27 DIAGNOSIS — D692 Other nonthrombocytopenic purpura: Secondary | ICD-10-CM | POA: Diagnosis not present

## 2017-12-27 DIAGNOSIS — L821 Other seborrheic keratosis: Secondary | ICD-10-CM | POA: Diagnosis not present

## 2017-12-27 DIAGNOSIS — L820 Inflamed seborrheic keratosis: Secondary | ICD-10-CM | POA: Diagnosis not present

## 2018-01-10 DIAGNOSIS — L503 Dermatographic urticaria: Secondary | ICD-10-CM | POA: Diagnosis not present

## 2018-01-10 DIAGNOSIS — L2089 Other atopic dermatitis: Secondary | ICD-10-CM | POA: Diagnosis not present

## 2018-01-23 ENCOUNTER — Other Ambulatory Visit: Payer: Self-pay | Admitting: Internal Medicine

## 2018-01-24 DIAGNOSIS — L503 Dermatographic urticaria: Secondary | ICD-10-CM | POA: Diagnosis not present

## 2018-01-24 DIAGNOSIS — L2089 Other atopic dermatitis: Secondary | ICD-10-CM | POA: Diagnosis not present

## 2018-02-06 DIAGNOSIS — L503 Dermatographic urticaria: Secondary | ICD-10-CM | POA: Diagnosis not present

## 2018-02-06 DIAGNOSIS — L2089 Other atopic dermatitis: Secondary | ICD-10-CM | POA: Diagnosis not present

## 2018-02-28 DIAGNOSIS — L820 Inflamed seborrheic keratosis: Secondary | ICD-10-CM | POA: Diagnosis not present

## 2018-02-28 DIAGNOSIS — L2089 Other atopic dermatitis: Secondary | ICD-10-CM | POA: Diagnosis not present

## 2018-02-28 DIAGNOSIS — L853 Xerosis cutis: Secondary | ICD-10-CM | POA: Diagnosis not present

## 2018-03-02 ENCOUNTER — Encounter: Payer: Self-pay | Admitting: Family Medicine

## 2018-03-02 ENCOUNTER — Ambulatory Visit (INDEPENDENT_AMBULATORY_CARE_PROVIDER_SITE_OTHER): Payer: Medicare Other | Admitting: Family Medicine

## 2018-03-02 VITALS — BP 120/62 | HR 82 | Temp 97.6°F | Ht 71.0 in | Wt 212.0 lb

## 2018-03-02 DIAGNOSIS — R0602 Shortness of breath: Secondary | ICD-10-CM | POA: Diagnosis not present

## 2018-03-02 DIAGNOSIS — I10 Essential (primary) hypertension: Secondary | ICD-10-CM | POA: Diagnosis not present

## 2018-03-02 DIAGNOSIS — R350 Frequency of micturition: Secondary | ICD-10-CM | POA: Diagnosis not present

## 2018-03-02 DIAGNOSIS — E119 Type 2 diabetes mellitus without complications: Secondary | ICD-10-CM

## 2018-03-02 DIAGNOSIS — Z125 Encounter for screening for malignant neoplasm of prostate: Secondary | ICD-10-CM

## 2018-03-02 DIAGNOSIS — L299 Pruritus, unspecified: Secondary | ICD-10-CM

## 2018-03-02 DIAGNOSIS — N401 Enlarged prostate with lower urinary tract symptoms: Secondary | ICD-10-CM

## 2018-03-02 LAB — POCT URINALYSIS DIPSTICK
Bilirubin, UA: NEGATIVE
GLUCOSE UA: NEGATIVE
Ketones, UA: NEGATIVE
NITRITE UA: NEGATIVE
Protein, UA: NEGATIVE
SPEC GRAV UA: 1.015 (ref 1.010–1.025)
Urobilinogen, UA: 0.2 E.U./dL
pH, UA: 5.5 (ref 5.0–8.0)

## 2018-03-02 MED ORDER — PROAIR HFA 108 (90 BASE) MCG/ACT IN AERS: 2.0000 | INHALATION_SPRAY | Freq: Four times a day (QID) | RESPIRATORY_TRACT | 1 refills | Status: DC | PRN

## 2018-03-02 NOTE — Progress Notes (Signed)
  [name], MD Phone: [phone]  Vihan Santagata Weisenburger is a 84 y.o. male who presents today for f/u.  CC: Hypertension, COPD, itching, very frequency  HYPERTENSION  Disease Monitoring  Home BP Monitoring 194 systolically Chest pain- no    Dyspnea- chronic Medications  Compliance-  Taking HCTZ, lisinopril.  Edema- no  COPD: Patient has chronic dyspnea on exertion that over the years has gradually gotten worse.  He has been evaluated by pulmonology for this.  He continues to follow with them.  He also follows with cardiology.  He notes no chest pain.  Some wheezing at times.  No cough.  Notes the albuterol does help with his breathing when he uses it.  He does use his other inhalers.  Urinary frequency: Patient notes sometimes he has to urinate frequently.  This is gradually gotten worse.  Some straining.  No dysuria.  No hematuria.  Does have good flow.  Does have urgency.  Not emptying well at times.  Patient reports he saw dermatology previously for significant skin itching.  They have been injecting medications every 2 weeks to help with this.  This has helped some.  Social History   Tobacco Use  Smoking Status Former Smoker  . Packs/day: 2.00  . Years: 35.00  . Pack years: 70.00  . Types: Cigarettes  . Last attempt to quit: 10/30/1985  . Years since quitting: 32.3  Smokeless Tobacco Never Used     ROS see history of present illness  Objective  Physical Exam Vitals:   03/02/18 1415  BP: 120/62  Pulse: 82  Temp: 97.6 F (36.4 C)  SpO2: 95%    BP Readings from Last 3 Encounters:  03/02/18 120/62  10/06/17 (!) 110/56  09/21/17 118/80   Wt Readings from Last 3 Encounters:  03/02/18 212 lb (96.2 kg)  10/06/17 214 lb (97.1 kg)  10/04/17 215 lb (97.5 kg)    Physical Exam  Constitutional: No distress.  Cardiovascular: Normal rate, regular rhythm and normal heart sounds.  Pulmonary/Chest: Effort normal and breath sounds normal.  Genitourinary:  Genitourinary  Comments: Normal rectum, prostate mild to moderately enlarged, no nodules noted, no tenderness  Musculoskeletal: He exhibits no edema.  Neurological: He is alert.  Skin: Skin is warm and dry. He is not diaphoretic.     Assessment/Plan: Please see individual problem list.  Hypertension Adequately controlled.  Continue current regimen.  SOBOE (shortness of breath on exertion) Chronic issue.  Relatively stable at this time.  He will continue to follow with cardiology and pulmonology.  Itching He will continue to see dermatology.  Benign prostatic hyperplasia with urinary frequency Urinary frequency likely related to BPH.  We will check labs and urinalysis and then likely start on Flomax.  Orders Placed This Encounter  Procedures  . Urine Culture  . Basic Metabolic Panel (BMET)  . HgB A1c  . PSA  . Urine Microscopic Only  . POCT Urinalysis Dipstick    Meds ordered this encounter  Medications  . PROAIR HFA 108 (90 Base) MCG/ACT inhaler    Sig: Inhale 2 puffs into the lungs every 6 (six) hours as needed for wheezing or shortness of breath.    Dispense:  1 Inhaler    Refill:  [place], MD [place]

## 2018-03-02 NOTE — Patient Instructions (Signed)
Nice to see you. Please continue to monitor your breathing. If it worsens please be evaluated.  We will check lab work today and contact you with the results and then determine whether or not to put you on a medication for your prostate.

## 2018-03-03 LAB — BASIC METABOLIC PANEL
BUN: 22 mg/dL (ref 7–25)
CALCIUM: 10 mg/dL (ref 8.6–10.3)
CHLORIDE: 98 mmol/L (ref 98–110)
CO2: 30 mmol/L (ref 20–32)
Creat: 1.06 mg/dL (ref 0.70–1.11)
GLUCOSE: 98 mg/dL (ref 65–99)
POTASSIUM: 4.7 mmol/L (ref 3.5–5.3)
SODIUM: 138 mmol/L (ref 135–146)

## 2018-03-03 LAB — HEMOGLOBIN A1C
EAG (MMOL/L): 8.2 (calc)
HEMOGLOBIN A1C: 6.8 %{Hb} — AB (ref <5.7)
MEAN PLASMA GLUCOSE: 148 (calc)

## 2018-03-03 LAB — URINE CULTURE
MICRO NUMBER:: 90916317
Result:: NO GROWTH
SPECIMEN QUALITY:: ADEQUATE

## 2018-03-03 LAB — URINALYSIS, MICROSCOPIC ONLY
Bacteria, UA: NONE SEEN /HPF
HYALINE CAST: NONE SEEN /LPF
RBC / HPF: NONE SEEN /HPF (ref 0–2)
SQUAMOUS EPITHELIAL / LPF: NONE SEEN /HPF (ref <=5)

## 2018-03-03 LAB — PSA: PSA: 3.3 ng/mL (ref <=4.0)

## 2018-03-06 DIAGNOSIS — N401 Enlarged prostate with lower urinary tract symptoms: Secondary | ICD-10-CM | POA: Insufficient documentation

## 2018-03-06 DIAGNOSIS — R350 Frequency of micturition: Secondary | ICD-10-CM | POA: Insufficient documentation

## 2018-03-06 NOTE — Assessment & Plan Note (Signed)
Adequately controlled.  Continue current regimen. 

## 2018-03-06 NOTE — Assessment & Plan Note (Signed)
Chronic issue.  Relatively stable at this time.  He will continue to follow with cardiology and pulmonology.

## 2018-03-06 NOTE — Assessment & Plan Note (Signed)
He will continue to see dermatology. 

## 2018-03-06 NOTE — Assessment & Plan Note (Signed)
Urinary frequency likely related to BPH.  We will check labs and urinalysis and then likely start on Flomax.

## 2018-03-10 ENCOUNTER — Other Ambulatory Visit: Payer: Self-pay | Admitting: Family Medicine

## 2018-03-10 MED ORDER — TAMSULOSIN HCL 0.4 MG PO CAPS: 0.4000 mg | ORAL_CAPSULE | Freq: Every day | ORAL | 3 refills | Status: DC

## 2018-03-13 ENCOUNTER — Telehealth: Payer: Self-pay

## 2018-03-13 ENCOUNTER — Ambulatory Visit (INDEPENDENT_AMBULATORY_CARE_PROVIDER_SITE_OTHER): Payer: Medicare Other | Admitting: Family Medicine

## 2018-03-13 ENCOUNTER — Encounter: Payer: Self-pay | Admitting: Family Medicine

## 2018-03-13 VITALS — BP 116/64 | HR 72 | Temp 97.7°F | Resp 15 | Wt 209.5 lb

## 2018-03-13 DIAGNOSIS — J3489 Other specified disorders of nose and nasal sinuses: Secondary | ICD-10-CM

## 2018-03-13 DIAGNOSIS — J00 Acute nasopharyngitis [common cold]: Secondary | ICD-10-CM | POA: Diagnosis not present

## 2018-03-13 DIAGNOSIS — R0981 Nasal congestion: Secondary | ICD-10-CM

## 2018-03-13 DIAGNOSIS — R059 Cough, unspecified: Secondary | ICD-10-CM

## 2018-03-13 DIAGNOSIS — R05 Cough: Secondary | ICD-10-CM | POA: Diagnosis not present

## 2018-03-13 MED ORDER — BENZONATATE 100 MG PO CAPS: 100.0000 mg | ORAL_CAPSULE | Freq: Three times a day (TID) | ORAL | 0 refills | Status: DC | PRN

## 2018-03-13 MED ORDER — LORATADINE 10 MG PO TABS: 10.0000 mg | ORAL_TABLET | Freq: Every day | ORAL | 2 refills | Status: DC

## 2018-03-13 MED ORDER — FLUTICASONE PROPIONATE 50 MCG/ACT NA SUSP: 2.0000 | Freq: Every day | NASAL | 2 refills | Status: DC

## 2018-03-13 NOTE — Telephone Encounter (Signed)
Copied from [place] [id number]. Topic: Inquiry >> Mar 12, 2018 11:43 AM Mylinda Latina, NT wrote: Reason for CRM: Patient friend  Mateo Flow called and states the patient would like to pick up his lab work from 03/02/18. Please call when the copy is ready for pick up CB# [phone]   Left voicemail for patient that Lab work is ready to be picked up

## 2018-03-13 NOTE — Progress Notes (Signed)
Subjective:    Patient ID: Cody Bridges, male    DOB: 1933/05/28, 84 y.o.   MRN: [id number]  HPI   Patient presents to clinic with nasal congestion, nasal drainage, cough for approximately past week. No known sick contacts.  Denies any fever or chills.  Has a history of COPD, but denies any need for use of rescue inhaler.  Denies wheezing or shortness of breath.  Denies any phlegm production with cough.  Patient Active Problem List   Diagnosis Date Noted  . Urine frequency 03/06/2018  . Benign prostatic hyperplasia with urinary frequency 03/06/2018  . Itching 09/21/2017  . Weakness of both lower extremities 05/01/2017  . Orthostasis 05/01/2017  . Lung mass   . Bilateral leg edema 01/26/2017  . Polyuria 01/26/2017  . Bruising 01/26/2017  . Diarrhea 10/25/2016  . Rash and nonspecific skin eruption 10/25/2016  . Diastasis recti 10/24/2016  . Non-recurrent unilateral inguinal hernia without obstruction or gangrene 10/24/2016  . Hypertension 10/24/2016  . Diabetes mellitus without complication (Cody Bridges) 26/83/4196  . Asthma 09/20/2016  . SOBOE (shortness of breath on exertion) 08/30/2016  . Colon polyps 08/29/2014  . Hyperlipemia, mixed 02/05/2014   Social History   Tobacco Use  . Smoking status: Former Smoker    Packs/day: 2.00    Years: 35.00    Pack years: 70.00    Types: Cigarettes    Last attempt to quit: 10/30/1985    Years since quitting: 32.3  . Smokeless tobacco: Never Used  Substance Use Topics  . Alcohol use: Yes    Comment: occasional   Review of Systems   Constitutional: Negative for chills, fatigue and fever.  HENT: Positive for congestion, nasal drainage (clear), sinus pressure.   Eyes: Negative.   Respiratory: Negative for cough, shortness of breath and wheezing.   Cardiovascular: Negative for chest pain, palpitations and leg swelling.  Gastrointestinal: Negative for abdominal pain, diarrhea, nausea and vomiting.  Genitourinary: Negative for dysuria,  frequency and urgency.  Musculoskeletal: Negative for arthralgias and myalgias.  Skin: Negative for color change, pallor and rash.  Neurological: Negative for syncope, light-headedness and headaches.  Psychiatric/Behavioral: The patient is not nervous/anxious.    Objective:   Physical Exam  Constitutional: He is oriented to person, place, and time. He appears well-developed and well-nourished. No distress.  HENT:  Head: Normocephalic and atraumatic.  Eyes: Pupils are equal, round, and reactive to light. Conjunctivae - Mild redness right eye, patient states he was just rubbing area before coming into appt, has eyedrops he uses for eye itch at home. EOM are normal. No scleral icterus. No discharge from eyes.  Nose/throat: +cobblestoning pattern on back of throat, clear nasal drainage present.  Neck: Normal range of motion. Neck supple. No tracheal deviation present.  Cardiovascular: Normal rate, regular rhythm and normal heart sounds.  Pulmonary/Chest: Effort normal and breath sounds normal. No respiratory distress. He has no wheezes. He has no rales.  Neurological: He is alert and oriented to person, place, and time.  Gait normal  Skin: Skin is warm and dry. He is not diaphoretic. No pallor.  Psychiatric: He has a normal mood and affect. His behavior is normal.  Nursing note and vitals reviewed.     Vitals:   03/13/18 1108  BP: 116/64  Pulse: 72  Resp: 15  Temp: 97.7 F (36.5 C)  SpO2: 97%    Assessment & Plan:   Common cold - increase fluids, rest, do good handwashing.   Cough -  use Tessalon  Perles as needed.  Congestion/postnasal drip -begin Claritin 1 tablet daily, use Flonase nasal spray to help open up nasal passages.  Follow-up if symptoms persist or worsen.  Keep regularly scheduled follow-up as planned for chronic conditions.

## 2018-03-13 NOTE — Patient Instructions (Signed)
Great to meet you!  Increase fluids, get good rest. Use your albuterol rescue inhaler as needed  Take loratadine and Flonase to help nasal congestion. Use tessalon perles as needed for cough.

## 2018-03-14 DIAGNOSIS — H01135 Eczematous dermatitis of left lower eyelid: Secondary | ICD-10-CM | POA: Diagnosis not present

## 2018-03-14 DIAGNOSIS — H01134 Eczematous dermatitis of left upper eyelid: Secondary | ICD-10-CM | POA: Diagnosis not present

## 2018-03-14 DIAGNOSIS — H01131 Eczematous dermatitis of right upper eyelid: Secondary | ICD-10-CM | POA: Diagnosis not present

## 2018-03-14 DIAGNOSIS — H01133 Eczematous dermatitis of right eye, unspecified eyelid: Secondary | ICD-10-CM | POA: Diagnosis not present

## 2018-03-14 DIAGNOSIS — L209 Atopic dermatitis, unspecified: Secondary | ICD-10-CM | POA: Diagnosis not present

## 2018-03-14 DIAGNOSIS — Z79899 Other long term (current) drug therapy: Secondary | ICD-10-CM | POA: Diagnosis not present

## 2018-03-26 ENCOUNTER — Encounter: Payer: Self-pay | Admitting: Family Medicine

## 2018-03-26 DIAGNOSIS — E119 Type 2 diabetes mellitus without complications: Secondary | ICD-10-CM | POA: Diagnosis not present

## 2018-03-26 LAB — HM DIABETES EYE EXAM

## 2018-03-27 DIAGNOSIS — M79672 Pain in left foot: Secondary | ICD-10-CM | POA: Diagnosis not present

## 2018-03-27 DIAGNOSIS — M79671 Pain in right foot: Secondary | ICD-10-CM | POA: Diagnosis not present

## 2018-03-27 DIAGNOSIS — B351 Tinea unguium: Secondary | ICD-10-CM | POA: Diagnosis not present

## 2018-03-27 DIAGNOSIS — L84 Corns and callosities: Secondary | ICD-10-CM | POA: Diagnosis not present

## 2018-03-28 DIAGNOSIS — L821 Other seborrheic keratosis: Secondary | ICD-10-CM | POA: Diagnosis not present

## 2018-03-28 DIAGNOSIS — L2089 Other atopic dermatitis: Secondary | ICD-10-CM | POA: Diagnosis not present

## 2018-03-28 DIAGNOSIS — L820 Inflamed seborrheic keratosis: Secondary | ICD-10-CM | POA: Diagnosis not present

## 2018-04-04 DIAGNOSIS — Z23 Encounter for immunization: Secondary | ICD-10-CM | POA: Diagnosis not present

## 2018-04-09 ENCOUNTER — Ambulatory Visit (INDEPENDENT_AMBULATORY_CARE_PROVIDER_SITE_OTHER): Payer: Medicare Other | Admitting: Internal Medicine

## 2018-04-09 ENCOUNTER — Ambulatory Visit: Payer: Medicare Other | Admitting: Internal Medicine

## 2018-04-09 ENCOUNTER — Encounter: Payer: Self-pay | Admitting: Internal Medicine

## 2018-04-09 VITALS — BP 122/62 | HR 81 | Resp 16 | Ht 71.0 in | Wt 211.0 lb

## 2018-04-09 DIAGNOSIS — J449 Chronic obstructive pulmonary disease, unspecified: Secondary | ICD-10-CM

## 2018-04-09 DIAGNOSIS — J8410 Pulmonary fibrosis, unspecified: Secondary | ICD-10-CM | POA: Diagnosis not present

## 2018-04-09 DIAGNOSIS — R911 Solitary pulmonary nodule: Secondary | ICD-10-CM

## 2018-04-09 NOTE — Progress Notes (Signed)
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  -Continue trelegy inhaler.  -Continue activity, he has completing pulmonary rehab, he did not do the continuation phase of pulmonary rehab. --Already completed flu vaccine this year.  --pneumococcal vaccine 05/01/17.  --Prevnar status uncertain, pt does not know if he has received it.   Pulmonary fibrosis.  -Seen on PFT, appears mild.   Lung Nodule.  --LUL shrinking nodule, likely inflammatory.  Left lower lobe nodule has been stable for nearly 1 year.  No need for further follow-up. --PET scan 12/29/2016, 1.7 cm left upper lobe nodule SUV equals 3.3>> ENB 02/09/2017 negative>> repeat CT 02/22/2017 decrease in size nodule to 8 mm, needle biopsy deferred    Date: 04/09/2018  MRN# [id number] Cody Bridges D[place] 14-May-1933    Cody Bridges is a 84 y.o. old male seen in consultation for chief complaint of:    Chief Complaint  Patient presents with  . COPD    sob with exertion and wheezing qhs. Pt is on Trelegy which is effective.    HPI:  The patient is an 84 year old male with emphysema. Last visit it was noted that he had a 1.7 cm left upper lobe nodule. He was referred for an ENB bronchoscopy, which was negative on 02/09/2017. Subsequently, the patient was sent for a needle biopsy, CT-guided, the left upper lobe nodule. However, on the scans at that time he was noted that the nodule had decreased in size to 8 mm, therefore, the biopsy was deferred.  Since his last visit he feels that his breathing is doing about the same, some good days and some bad days. He is using proventil a few days per week. He takes trelegy once per day, not rinsing mouth. He feels that it is helping.  He is taking dupixent injections every 2 weeks which has been helping with his allergic eczema. He recently went and got a flu shot and some other vaccine from [facility]. He does not know what it was, but is quite certain  that it cost him 46 dollars.   **CT chest 10/05/17>> in  comparison with previous on 02/22/17, previously seen nodule left upper lobe has nearly disappeared.  Unchanged left lower lobe nodule which has not changed since May 2018.Marland Kitchen  Ct chest hi-res 12/19/16; this shows fairly mild bibasilar ILD. More significantly there is a 2 cm irregular nodule in LUL, there is also mild emphysema.   *PFT *Ratio=59% Fev=53% with reversility.  Minimal air trapping.  DLCO=92%.    desat walk 10/25/16; Baseline sat on RA at rest 97% and HR 85. After walking 360 feet sat was 95% and HR 95; mild dyspnea.     Current Outpatient Medications:  .  benzonatate (TESSALON) 100 MG capsule, Take 1 capsule (100 mg total) by mouth 3 (three) times daily as needed for cough., Disp: 30 capsule, Rfl: 0 .  budesonide-formoterol (SYMBICORT) 160-4.5 MCG/ACT inhaler, Inhale 2 puffs into the lungs 2 (two) times daily as needed (for shortness of breath)., Disp: , Rfl:  .  calcium carbonate (TUMS - DOSED IN MG ELEMENTAL CALCIUM) 500 MG chewable tablet, Chew 1-2 tablets by mouth 3 (three) times daily as needed (for acid reflux/indigestion.)., Disp: , Rfl:  .  Cholecalciferol (VITAMIN D-3) 5000 units TABS, Take 5,000 Units by mouth daily. , Disp: , Rfl:  .  DUPIXENT 300 MG/2ML SOSY, , Disp: , Rfl:  .  EUCRISA 2 % OINT, Apply 1 application topically  daily as needed., Disp: , Rfl: 2 .  fluticasone (FLONASE) 50 MCG/ACT nasal spray, Place 2 sprays into both nostrils daily., Disp: 16 g, Rfl: 2 .  hydrochlorothiazide (HYDRODIURIL) 25 MG tablet, Take 25 mg by mouth daily., Disp: , Rfl:  .  lisinopril (PRINIVIL,ZESTRIL) 5 MG tablet, Take 5 mg by mouth daily., Disp: , Rfl:  .  loratadine (CLARITIN) 10 MG tablet, Take 1 tablet (10 mg total) by mouth daily., Disp: 30 tablet, Rfl: 2 .  metFORMIN (GLUCOPHAGE) 500 MG tablet, Take 500 mg by mouth 2 (two) times daily., Disp: , Rfl:  .  Multiple Vitamin (MULTIVITAMIN WITH MINERALS) TABS tablet, Take 1  tablet by mouth daily., Disp: , Rfl:  .  PROAIR HFA 108 (90 Base) MCG/ACT inhaler, Inhale 2 puffs into the lungs every 6 (six) hours as needed for wheezing or shortness of breath., Disp: 1 Inhaler, Rfl: 1 .  rosuvastatin (CRESTOR) 20 MG tablet, Take 20 mg by mouth daily., Disp: , Rfl:  .  tamsulosin (FLOMAX) 0.4 MG CAPS capsule, Take 1 capsule (0.4 mg total) by mouth daily., Disp: 30 capsule, Rfl: 3 .  TRELEGY ELLIPTA 100-62.5-25 MCG/INH AEPB, TAKE 1 PUFF BY MOUTH EVERY DAY, Disp: 120 each, Rfl: 2 .  triamcinolone lotion (KENALOG) 0.1 %, Apply 1 application topically 2 (two) times daily as needed., Disp: 120 mL, Rfl: 0     Allergies:  Clindamycin and Penicillin v potassium  Review of Systems:  Constitutional: Feels well. Cardiovascular: No chest pain.  Pulmonary: Denies hemoptysis.   The remainder of systems were reviewed and were found to be negative other than what is documented in the HPI.    Physical Examination:   VS: BP 122/62 (BP Location: Left Arm, Cuff Size: Large)   Pulse 81   Resp 16   Ht 5\' 11"  (1.803 m)   Wt 211 lb (95.7 kg)   SpO2 96%   BMI 29.43 kg/m   General Appearance: No distress  Neuro:without focal findings, mental status, speech normal, alert and oriented HEENT: PERRLA, EOM intact Pulmonary: No wheezing, No rales, decreased air entry bilateraly.  CardiovascularNormal S1,S2.  No m/r/g.  Abdomen: Benign, Soft, non-tender, No masses Renal:  No costovertebral tenderness  GU:  No performed at this time. Endoc: No evident thyromegaly, no signs of acromegaly or Cushing features Skin:   warm, no rashes, no ecchymosis  Extremities: normal, no cyanosis, clubbing.      LABORATORY PANEL:   CBC No results for input(s): WBC, HGB, HCT, PLT in the last 168 hours. ------------------------------------------------------------------------------------------------------------------  Chemistries  No results for input(s): NA, K, CL, CO2, GLUCOSE, BUN, CREATININE,  CALCIUM, MG, AST, ALT, ALKPHOS, BILITOT in the last 168 hours.  Invalid input(s): GFRCGP ------------------------------------------------------------------------------------------------------------------  Cardiac Enzymes No results for input(s): TROPONINI in the last 168 hours. ------------------------------------------------------------  RADIOLOGY:  No results found.     Thank  you for the consultation and for allowing [facility] to assist in the care of your patient. Our recommendations are noted above.  Please contact us if we can be of further service.   Marda Stalker, M.D., F.C.C.P.  Board Certified in Internal Medicine, Pulmonary Medicine, [facility], and Sleep Medicine.  [facility] Office Number: [phone]   04/09/2018

## 2018-04-09 NOTE — Patient Instructions (Signed)
Continue Trelegy inhaler daily, remember to rinse mouth after use.

## 2018-04-11 DIAGNOSIS — I788 Other diseases of capillaries: Secondary | ICD-10-CM | POA: Diagnosis not present

## 2018-04-11 DIAGNOSIS — D1800 Hemangioma unspecified site: Secondary | ICD-10-CM | POA: Diagnosis not present

## 2018-04-11 DIAGNOSIS — L2089 Other atopic dermatitis: Secondary | ICD-10-CM | POA: Diagnosis not present

## 2018-04-11 DIAGNOSIS — L820 Inflamed seborrheic keratosis: Secondary | ICD-10-CM | POA: Diagnosis not present

## 2018-04-11 DIAGNOSIS — L570 Actinic keratosis: Secondary | ICD-10-CM | POA: Diagnosis not present

## 2018-04-11 DIAGNOSIS — L821 Other seborrheic keratosis: Secondary | ICD-10-CM | POA: Diagnosis not present

## 2018-04-12 DIAGNOSIS — R9431 Abnormal electrocardiogram [ECG] [EKG]: Secondary | ICD-10-CM | POA: Insufficient documentation

## 2018-04-12 DIAGNOSIS — R0602 Shortness of breath: Secondary | ICD-10-CM | POA: Diagnosis not present

## 2018-04-12 DIAGNOSIS — E782 Mixed hyperlipidemia: Secondary | ICD-10-CM | POA: Diagnosis not present

## 2018-04-12 DIAGNOSIS — E119 Type 2 diabetes mellitus without complications: Secondary | ICD-10-CM | POA: Diagnosis not present

## 2018-04-12 DIAGNOSIS — I10 Essential (primary) hypertension: Secondary | ICD-10-CM | POA: Diagnosis not present

## 2018-04-23 DIAGNOSIS — L853 Xerosis cutis: Secondary | ICD-10-CM | POA: Diagnosis not present

## 2018-04-23 DIAGNOSIS — L209 Atopic dermatitis, unspecified: Secondary | ICD-10-CM | POA: Diagnosis not present

## 2018-04-23 DIAGNOSIS — L821 Other seborrheic keratosis: Secondary | ICD-10-CM | POA: Diagnosis not present

## 2018-05-03 ENCOUNTER — Other Ambulatory Visit: Payer: Self-pay | Admitting: Family Medicine

## 2018-05-14 ENCOUNTER — Other Ambulatory Visit: Payer: Self-pay | Admitting: Family Medicine

## 2018-05-15 DIAGNOSIS — L2089 Other atopic dermatitis: Secondary | ICD-10-CM | POA: Diagnosis not present

## 2018-05-29 DIAGNOSIS — L2089 Other atopic dermatitis: Secondary | ICD-10-CM | POA: Diagnosis not present

## 2018-06-14 DIAGNOSIS — L853 Xerosis cutis: Secondary | ICD-10-CM | POA: Diagnosis not present

## 2018-06-14 DIAGNOSIS — L2089 Other atopic dermatitis: Secondary | ICD-10-CM | POA: Diagnosis not present

## 2018-06-19 ENCOUNTER — Other Ambulatory Visit: Payer: Self-pay | Admitting: Family Medicine

## 2018-06-27 DIAGNOSIS — L2089 Other atopic dermatitis: Secondary | ICD-10-CM | POA: Diagnosis not present

## 2018-07-04 ENCOUNTER — Ambulatory Visit (INDEPENDENT_AMBULATORY_CARE_PROVIDER_SITE_OTHER): Payer: Medicare Other

## 2018-07-04 ENCOUNTER — Encounter: Payer: Self-pay | Admitting: Family Medicine

## 2018-07-04 ENCOUNTER — Ambulatory Visit (INDEPENDENT_AMBULATORY_CARE_PROVIDER_SITE_OTHER): Payer: Medicare Other | Admitting: Family Medicine

## 2018-07-04 VITALS — BP 118/62 | HR 101 | Temp 98.5°F | Ht 71.0 in | Wt 213.6 lb

## 2018-07-04 DIAGNOSIS — J449 Chronic obstructive pulmonary disease, unspecified: Secondary | ICD-10-CM | POA: Diagnosis not present

## 2018-07-04 DIAGNOSIS — E119 Type 2 diabetes mellitus without complications: Secondary | ICD-10-CM | POA: Diagnosis not present

## 2018-07-04 DIAGNOSIS — R5382 Chronic fatigue, unspecified: Secondary | ICD-10-CM

## 2018-07-04 DIAGNOSIS — I491 Atrial premature depolarization: Secondary | ICD-10-CM

## 2018-07-04 DIAGNOSIS — M25651 Stiffness of right hip, not elsewhere classified: Secondary | ICD-10-CM

## 2018-07-04 DIAGNOSIS — E782 Mixed hyperlipidemia: Secondary | ICD-10-CM | POA: Diagnosis not present

## 2018-07-04 DIAGNOSIS — I499 Cardiac arrhythmia, unspecified: Secondary | ICD-10-CM

## 2018-07-04 DIAGNOSIS — M25551 Pain in right hip: Secondary | ICD-10-CM | POA: Diagnosis not present

## 2018-07-04 DIAGNOSIS — R0602 Shortness of breath: Secondary | ICD-10-CM

## 2018-07-04 DIAGNOSIS — I10 Essential (primary) hypertension: Secondary | ICD-10-CM

## 2018-07-04 LAB — LIPID PANEL
CHOL/HDL RATIO: 3
Cholesterol: 120 mg/dL (ref 0–200)
HDL: 41.9 mg/dL (ref >39.00)
LDL Cholesterol: 42 mg/dL (ref 0–99)
NONHDL: 77.63
Triglycerides: 176 mg/dL — ABNORMAL HIGH (ref 0.0–149.0)
VLDL: 35.2 mg/dL (ref 0.0–40.0)

## 2018-07-04 LAB — CBC
HCT: 39.4 % (ref 39.0–52.0)
Hemoglobin: 13.1 g/dL (ref 13.0–17.0)
MCHC: 33.2 g/dL (ref 30.0–36.0)
MCV: 98.4 fl (ref 78.0–100.0)
Platelets: 306 10*3/uL (ref 150.0–400.0)
RBC: 4 Mil/uL — ABNORMAL LOW (ref 4.22–5.81)
RDW: 13 % (ref 11.5–15.5)
WBC: 8.4 10*3/uL (ref 4.0–10.5)

## 2018-07-04 LAB — COMPREHENSIVE METABOLIC PANEL
ALBUMIN: 4 g/dL (ref 3.5–5.2)
ALT: 25 U/L (ref 0–53)
AST: 18 U/L (ref 0–37)
Alkaline Phosphatase: 66 U/L (ref 39–117)
BILIRUBIN TOTAL: 0.5 mg/dL (ref 0.2–1.2)
BUN: 20 mg/dL (ref 6–23)
CO2: 32 meq/L (ref 19–32)
CREATININE: 1.07 mg/dL (ref 0.40–1.50)
Calcium: 9.9 mg/dL (ref 8.4–10.5)
Chloride: 99 mEq/L (ref 96–112)
GFR: 70.09 mL/min (ref >60.00)
Glucose, Bld: 142 mg/dL — ABNORMAL HIGH (ref 70–99)
Potassium: 4.3 mEq/L (ref 3.5–5.1)
SODIUM: 138 meq/L (ref 135–145)
Total Protein: 7.4 g/dL (ref 6.0–8.3)

## 2018-07-04 LAB — HEMOGLOBIN A1C: Hgb A1c MFr Bld: 6.5 % (ref 4.6–6.5)

## 2018-07-04 MED ORDER — ALBUTEROL SULFATE HFA 108 (90 BASE) MCG/ACT IN AERS: 1.0000 | INHALATION_SPRAY | Freq: Four times a day (QID) | RESPIRATORY_TRACT | 2 refills | Status: DC | PRN

## 2018-07-04 NOTE — Patient Instructions (Addendum)
Nice to see you. We will get labs today and an x-ray. 

## 2018-07-04 NOTE — Progress Notes (Signed)
[name], MD Phone: [phone]  Cody Bridges is a 84 y.o. male who presents today for follow-up.  CC: Hypertension, COPD, diabetes, decreased range of motion right hip  Hypertension: Not checking at home.  He is taking HCTZ and lisinopril.  No chest pain.  He has chronic dyspnea on exertion that has been slowly progressive for some time.  He has been evaluated by cardiology and pulmonology for that.  He describes it is not having much energy and having to sit down to rest.  This has been going on for greater than 5 years.  No acute changes.  No edema.  COPD: He is currently using Trelegy.  He uses Symbicort as needed though he has run out of that sometime ago.  He uses the albuterol many times per day.  Some chronic cough with no productive nature.  Some occasional wheezing.  Occasional nighttime symptoms.  Diabetes: He is not checking blood sugars.  He is taking metformin.  He notes dry mouth at night though no polyuria or polydipsia.  No hypoglycemia.  Patient reports also feeling as though he has difficulty with range of motion in his right hip.  Notes his hip feels stiff.  This is been going on for some time now.  Social History   Tobacco Use  Smoking Status Former Smoker  . Packs/day: 2.00  . Years: 35.00  . Pack years: 70.00  . Types: Cigarettes  . Last attempt to quit: 10/30/1985  . Years since quitting: 32.6  Smokeless Tobacco Never Used     ROS see history of present illness  Objective  Physical Exam Vitals:   07/04/18 0924  BP: 118/62  Pulse: (!) 101  Temp: 98.5 F (36.9 C)  SpO2: 97%    BP Readings from Last 3 Encounters:  07/04/18 118/62  04/09/18 122/62  03/13/18 116/64   Wt Readings from Last 3 Encounters:  07/04/18 213 lb 9.6 oz (96.9 kg)  04/09/18 211 lb (95.7 kg)  03/13/18 209 lb 8 oz (95 kg)    Physical Exam  Constitutional: No distress.  Cardiovascular: Normal rate and normal heart sounds.  Periods of regular rhythm with extra  beats noted  Pulmonary/Chest: Effort normal and breath sounds normal.  Musculoskeletal: He exhibits no edema.  5/5 strength bilateral quads, hamstrings, plantar flexion, dorsiflexion, and hip flexion, sensation light touch intact bilateral lower extremities, 2+ patellar reflexes, decreased internal and external range of motion right hip, intact left hip range of motion  Neurological: He is alert.  Skin: Skin is warm and dry. He is not diaphoretic.   EKG: Sinus rhythm, occasional PAC, rate 98  Assessment/Plan: Please see individual problem list.  COPD (chronic obstructive pulmonary disease) ([place]) Patient will continue to see pulmonology.  He will continue his current inhaler regimen.  I suspect some of his dyspnea on exertion is related to deconditioning.  Hypertension Adequately controlled.  Continue current regimen.  Diabetes mellitus without complication (HCC) Continue metformin.  Check lab work.  Premature atrial complex This accounts for his exam findings.  Discussed that these are benign findings.  Decreased range of right hip movement Possibly an intra-articular issue.  His strength is intact in his lower extremities.  We will check an x-ray.   Orders Placed This Encounter  Procedures  . DG HIP UNILAT WITH PELVIS 2-3 VIEWS RIGHT    Standing Status:   Future    Number of Occurrences:   1    Standing Expiration Date:   09/05/2019  Order Specific Question:   Reason for Exam (SYMPTOM  OR DIAGNOSIS REQUIRED)    Answer:   right hip decreased ROM    Order Specific Question:   Preferred imaging location?    Answer:   [facility] Specific Question:   Radiology Contrast Protocol - do NOT remove file path    Answer:   \\[email or web address].pdf  . HgB A1c  . Lipid panel  . Comp Met (CMET)  . CBC  . EKG 12-Lead    Meds ordered this encounter  Medications  . albuterol (PROVENTIL HFA;VENTOLIN HFA) 108 (90 Base) MCG/ACT  inhaler    Sig: Inhale 1-2 puffs into the lungs every 6 (six) hours as needed for wheezing or shortness of breath.    Dispense:  1 Inhaler    Refill:  [place], MD [place]

## 2018-07-04 NOTE — Assessment & Plan Note (Signed)
Adequately controlled.  Continue current regimen. 

## 2018-07-04 NOTE — Assessment & Plan Note (Signed)
Patient will continue to see pulmonology.  He will continue his current inhaler regimen.  I suspect some of his dyspnea on exertion is related to deconditioning.

## 2018-07-04 NOTE — Assessment & Plan Note (Signed)
This accounts for his exam findings.  Discussed that these are benign findings.

## 2018-07-04 NOTE — Assessment & Plan Note (Signed)
Possibly an intra-articular issue.  His strength is intact in his lower extremities.  We will check an x-ray.

## 2018-07-04 NOTE — Assessment & Plan Note (Signed)
Continue metformin.  Check lab work. 

## 2018-07-11 DIAGNOSIS — L2089 Other atopic dermatitis: Secondary | ICD-10-CM | POA: Diagnosis not present

## 2018-07-11 DIAGNOSIS — L820 Inflamed seborrheic keratosis: Secondary | ICD-10-CM | POA: Diagnosis not present

## 2018-07-11 DIAGNOSIS — L853 Xerosis cutis: Secondary | ICD-10-CM | POA: Diagnosis not present

## 2018-07-20 DIAGNOSIS — L2081 Atopic neurodermatitis: Secondary | ICD-10-CM | POA: Diagnosis not present

## 2018-07-23 ENCOUNTER — Encounter: Payer: Self-pay | Admitting: *Deleted

## 2018-08-08 DIAGNOSIS — L209 Atopic dermatitis, unspecified: Secondary | ICD-10-CM | POA: Diagnosis not present

## 2018-08-08 DIAGNOSIS — L299 Pruritus, unspecified: Secondary | ICD-10-CM | POA: Diagnosis not present

## 2018-08-08 DIAGNOSIS — L853 Xerosis cutis: Secondary | ICD-10-CM | POA: Diagnosis not present

## 2018-08-13 ENCOUNTER — Telehealth: Payer: Self-pay | Admitting: Family Medicine

## 2018-08-13 NOTE — Telephone Encounter (Signed)
Pt states he would like a call regarding having to do PT. Please advise? Thank you!  Call pt @ [phone].

## 2018-08-14 ENCOUNTER — Other Ambulatory Visit: Payer: Self-pay | Admitting: Family Medicine

## 2018-08-14 NOTE — Telephone Encounter (Signed)
I referred him for this.  Noted that he is declining this.  If you would like to do this in the future he should let us know.

## 2018-08-14 NOTE — Telephone Encounter (Signed)
Pt stated that he received a phone call and a letter to do PT and he is unsure if Dr. Caryl Bis wanted him to do this or another doctor but he is declining it at this time.

## 2018-08-22 DIAGNOSIS — L853 Xerosis cutis: Secondary | ICD-10-CM | POA: Diagnosis not present

## 2018-08-22 DIAGNOSIS — L821 Other seborrheic keratosis: Secondary | ICD-10-CM | POA: Diagnosis not present

## 2018-08-22 DIAGNOSIS — D1800 Hemangioma unspecified site: Secondary | ICD-10-CM | POA: Diagnosis not present

## 2018-08-22 DIAGNOSIS — L2089 Other atopic dermatitis: Secondary | ICD-10-CM | POA: Diagnosis not present

## 2018-08-29 ENCOUNTER — Ambulatory Visit (INDEPENDENT_AMBULATORY_CARE_PROVIDER_SITE_OTHER): Payer: Medicare Other | Admitting: Family Medicine

## 2018-08-29 ENCOUNTER — Encounter: Payer: Self-pay | Admitting: Family Medicine

## 2018-08-29 ENCOUNTER — Ambulatory Visit (INDEPENDENT_AMBULATORY_CARE_PROVIDER_SITE_OTHER): Payer: Medicare Other

## 2018-08-29 VITALS — BP 120/62 | HR 90 | Temp 98.0°F | Wt 209.6 lb

## 2018-08-29 DIAGNOSIS — R2242 Localized swelling, mass and lump, left lower limb: Secondary | ICD-10-CM

## 2018-08-29 DIAGNOSIS — M79652 Pain in left thigh: Secondary | ICD-10-CM | POA: Diagnosis not present

## 2018-08-29 DIAGNOSIS — I499 Cardiac arrhythmia, unspecified: Secondary | ICD-10-CM | POA: Diagnosis not present

## 2018-08-29 DIAGNOSIS — R05 Cough: Secondary | ICD-10-CM | POA: Diagnosis not present

## 2018-08-29 DIAGNOSIS — R002 Palpitations: Secondary | ICD-10-CM | POA: Insufficient documentation

## 2018-08-29 DIAGNOSIS — R29898 Other symptoms and signs involving the musculoskeletal system: Secondary | ICD-10-CM

## 2018-08-29 DIAGNOSIS — R059 Cough, unspecified: Secondary | ICD-10-CM | POA: Insufficient documentation

## 2018-08-29 LAB — CBC
HCT: 40.6 % (ref 39.0–52.0)
Hemoglobin: 13.5 g/dL (ref 13.0–17.0)
MCHC: 33.2 g/dL (ref 30.0–36.0)
MCV: 98 fl (ref 78.0–100.0)
Platelets: 325 10*3/uL (ref 150.0–400.0)
RBC: 4.14 Mil/uL — ABNORMAL LOW (ref 4.22–5.81)
RDW: 12.8 % (ref 11.5–15.5)
WBC: 9.3 10*3/uL (ref 4.0–10.5)

## 2018-08-29 LAB — BASIC METABOLIC PANEL
BUN: 28 mg/dL — ABNORMAL HIGH (ref 6–23)
CO2: 30 mEq/L (ref 19–32)
CREATININE: 1.11 mg/dL (ref 0.40–1.50)
Calcium: 10.1 mg/dL (ref 8.4–10.5)
Chloride: 97 mEq/L (ref 96–112)
GFR: 63.19 mL/min (ref >60.00)
Glucose, Bld: 127 mg/dL — ABNORMAL HIGH (ref 70–99)
Potassium: 3.8 mEq/L (ref 3.5–5.1)
Sodium: 137 mEq/L (ref 135–145)

## 2018-08-29 LAB — TSH: TSH: 2.69 u[IU]/mL (ref 0.35–4.50)

## 2018-08-29 NOTE — Assessment & Plan Note (Signed)
I suspect this is related to his known history of PACs/PVCs.  EKG today reassuring.  We will check lab work. He reports seeing cardiology for this and being advised he has PVCs. He will monitor.

## 2018-08-29 NOTE — Patient Instructions (Signed)
Nice to see you. We will get lab work today.  Please stay off lisinopril. We will get an x-ray as well today. We will get you set up for an ultrasound of your thigh. Somebody will contact you for physical therapy.

## 2018-08-29 NOTE — Assessment & Plan Note (Signed)
Chronic issue with possible worsening recently.  X-ray to be completed.  Ultrasound ordered.  Discussed possible need for MRI depending on x-ray and ultrasound results.

## 2018-08-29 NOTE — Progress Notes (Addendum)
[name], MD Phone: [phone]  Cody Bridges is a 85 y.o. male who presents today for same-day visit.  CC: Dry cough, left thigh mass, palpitations,  Dry cough: Patient notes this has been going on for 2 to 3 weeks.  His wife thinks this is related to lisinopril.  They discontinued this yesterday.  He does occasionally hear wheezing.  He notes chronic sinus congestion.  He does note this responds to Flonase.  He notes occasional postnasal drip.  He only has reflux symptoms if he eats something that causes reflux.  This does not occur frequently.  No dysphagia or blood in his stool.  He does have COPD.  He is on Trelegy and Symbicort.  He notes he is sore from coughing.  He has chronic dyspnea on exertion that is unchanged.  Left thigh mass: Patient notes this has been present for 3 to 4 years.  Just started hurting recently.  Possibly has gotten larger.  Responds to ibuprofen.  No prior imaging.  He describes it as throbbing when he lays down.  Some pain with walking.  Palpitations: He does note occasional palpitations.  He has a history of PACs.  EKG completed today with normal sinus rhythm.  Patient's wife and patient both report chronic bilateral leg weakness that has been going on for some time.  Patient's wife notes he does not do much physical activity.  They think he would benefit from physical therapy.  Social History   Tobacco Use  Smoking Status Former Smoker  . Packs/day: 2.00  . Years: 35.00  . Pack years: 70.00  . Types: Cigarettes  . Last attempt to quit: 10/30/1985  . Years since quitting: 32.8  Smokeless Tobacco Never Used     ROS see history of present illness  Objective  Physical Exam Vitals:   08/29/18 0813  BP: 120/62  Pulse: 90  Temp: 98 F (36.7 C)  SpO2: 98%    BP Readings from Last 3 Encounters:  08/29/18 120/62  07/04/18 118/62  04/09/18 122/62   Wt Readings from Last 3 Encounters:  08/29/18 209 lb 9.6 oz (95.1 kg)  07/04/18 213  lb 9.6 oz (96.9 kg)  04/09/18 211 lb (95.7 kg)    Physical Exam Constitutional:      General: He is not in acute distress.    Appearance: He is not diaphoretic.  HENT:     Head: Normocephalic and atraumatic.     Mouth/Throat:     Mouth: Mucous membranes are moist.     Pharynx: Oropharynx is clear.  Cardiovascular:     Rate and Rhythm: Normal rate and regular rhythm.     Heart sounds: Normal heart sounds.  Pulmonary:     Effort: Pulmonary effort is normal.     Breath sounds: Normal breath sounds.  Musculoskeletal:     Comments: Soft tissue mass noted in the anterior portion of his mid left thigh, this is soft, there is no tenderness, there is no surrounding erythema or overlying erythema  Skin:    General: Skin is warm and dry.  Neurological:     Mental Status: He is alert.     Comments: 5/5 strength in bilateral biceps, triceps, grip, quads, hamstrings, plantar and dorsiflexion, sensation to light touch intact in bilateral UE and LE, normal gait, absent patellar reflexes    EKG: Normal sinus rhythm, rate 90, no arrhythmia or PVCs or PACs  Assessment/Plan: Please see individual problem list.  Cough Cough may be related to lisinopril.  He will see how he does over the next 5 days after coming off of this.  If it persists they will contact us and I would trial treatment for reflux and allergic rhinitis.  Mass of left thigh Chronic issue with possible worsening recently.  X-ray to be completed.  Ultrasound ordered.  Discussed possible need for MRI depending on x-ray and ultrasound results.  Weakness of both lower extremities I suspect this is deconditioning related.  PT ordered.  Palpitations I suspect this is related to his known history of PACs/PVCs.  EKG today reassuring.  We will check lab work. He reports seeing cardiology for this and being advised he has PVCs. He will monitor.    Orders Placed This Encounter  Procedures  . DG FEMUR MIN 2 VIEWS LEFT    Standing  Status:   Future    Number of Occurrences:   1    Standing Expiration Date:   10/28/2019    Order Specific Question:   Reason for Exam (SYMPTOM  OR DIAGNOSIS REQUIRED)    Answer:   left thigh mass, mid portion of the thigh    Order Specific Question:   Preferred imaging location?    Answer:   [facility] Specific Question:   Radiology Contrast Protocol - do NOT remove file path    Answer:   \\[email or web address]\DXFluoroContrastProtocols.pdf  . [place] LT LOWER EXTREM LTD SOFT TISSUE NON VASCULAR    Standing Status:   Future    Standing Expiration Date:   10/28/2019    Order Specific Question:   Reason for Exam (SYMPTOM  OR DIAGNOSIS REQUIRED)    Answer:   soft tissue mass, left mid anterior thigh, present for 3-4 years, recently enlarging and painful    Order Specific Question:   Preferred imaging location?    Answer:   [facility]  . TSH  . Basic Metabolic Panel (BMET)  . CBC  . Ambulatory referral to Physical Therapy    Referral Priority:   Routine    Referral Type:   Physical Medicine    Referral Reason:   Specialty Services Required    Requested Specialty:   Physical Therapy    Number of Visits Requested:   1  . EKG 12-Lead    No orders of the defined types were placed in this encounter.    [name], MD [place]

## 2018-08-29 NOTE — Assessment & Plan Note (Signed)
I suspect this is deconditioning related.  PT ordered.

## 2018-08-29 NOTE — Assessment & Plan Note (Addendum)
Cough may be related to lisinopril.  He will see how he does over the next 5 days after coming off of this.  If it persists they will contact us and I would trial treatment for reflux and allergic rhinitis.

## 2018-09-03 ENCOUNTER — Ambulatory Visit: Payer: Self-pay | Admitting: *Deleted

## 2018-09-03 ENCOUNTER — Telehealth: Payer: Self-pay | Admitting: *Deleted

## 2018-09-03 DIAGNOSIS — R2242 Localized swelling, mass and lump, left lower limb: Secondary | ICD-10-CM

## 2018-09-03 DIAGNOSIS — R0981 Nasal congestion: Secondary | ICD-10-CM

## 2018-09-03 NOTE — Telephone Encounter (Addendum)
Message reviewed.  We could trial him on a medication such as omeprazole for reflux and Claritin for allergies to see if his cough improved.  If he is willing to do this I can send this to his pharmacy.  I will forward this to [facility] as well to get the ultrasound scheduled.  Please check with Melissa to make sure she got this message.

## 2018-09-03 NOTE — Telephone Encounter (Signed)
Copied from [facility] [id number]. Topic: General - Inquiry >> Sep 03, 2018 10:16 AM Virl Axe D wrote: Reason for CRM: Pt called to follow up on CT Scan/Ultrasound? for pt's left leg. Stated Dr. Caryl Bis instructed them to call if they had not heard anything and they have not. Pelase advise. CB# [phone]

## 2018-09-03 NOTE — Telephone Encounter (Signed)
Sent to PCP to advise next steps

## 2018-09-03 NOTE — Telephone Encounter (Signed)
Contacted pt regarding concerns; the pt and his wife Mateo Flow have multiple concerns:  1.) state that his cough is not better and his BP is 146/74 and HR 73; 147/17 HR 70  without lisinopril, per Dr Ellen Henri note dated 1/39/2020, "Cough may be related to lisinopril.  He will see how he does over the next 5 days after coming off of this.  If it persists they will contact us and I would trial treatment for reflux and allergic rhinitis.";  the [facility] uses [facility] [address] [place], [place]; he can be contacted at [phone]; 2.)  the pt is also concerned about ultrasound that was to be ordered; he is concerned because no one has contacted him; explained that the order has been placed, and that radiology will contact him to schedule appointment   3.) the pt also asks about his ekg 08/29/18; also reviewed  Dr Ellen Henri note, "I suspect this is related to his known history of PACs/PVCs.  EKG today reassuring.  We will check lab work. He reports seeing cardiology for this and being advised he has PVCs. He will monitor"; pt and his wife verbalized understanding and will contact Dr Nehemiah Massed, cardiology; will route to office for final disposition.  Reason for Disposition . [1] Caller requesting NON-URGENT health information AND [2] PCP's office is the best resource  Answer Assessment - Initial Assessment Questions 1. REASON FOR CALL or QUESTION: "What is your reason for calling today?" or "How can I best help you?" or "What question do you have that I can help answer?"     Cough, blood pressure medication, ultrasound, and ekg  Protocols used: INFORMATION ONLY CALL-A-AH

## 2018-09-03 NOTE — Telephone Encounter (Signed)
Duplicated message.

## 2018-09-04 DIAGNOSIS — I70219 Atherosclerosis of native arteries of extremities with intermittent claudication, unspecified extremity: Secondary | ICD-10-CM | POA: Insufficient documentation

## 2018-09-04 DIAGNOSIS — E1159 Type 2 diabetes mellitus with other circulatory complications: Secondary | ICD-10-CM | POA: Diagnosis not present

## 2018-09-04 DIAGNOSIS — M79605 Pain in left leg: Secondary | ICD-10-CM | POA: Insufficient documentation

## 2018-09-04 DIAGNOSIS — I10 Essential (primary) hypertension: Secondary | ICD-10-CM | POA: Diagnosis not present

## 2018-09-04 DIAGNOSIS — E782 Mixed hyperlipidemia: Secondary | ICD-10-CM | POA: Diagnosis not present

## 2018-09-05 DIAGNOSIS — L853 Xerosis cutis: Secondary | ICD-10-CM | POA: Diagnosis not present

## 2018-09-05 DIAGNOSIS — L2089 Other atopic dermatitis: Secondary | ICD-10-CM | POA: Diagnosis not present

## 2018-09-05 NOTE — Telephone Encounter (Signed)
It has been scheduled.

## 2018-09-05 NOTE — Telephone Encounter (Signed)
Do you know the status on these orders Melissa?

## 2018-09-07 ENCOUNTER — Ambulatory Visit
Admission: RE | Admit: 2018-09-07 | Discharge: 2018-09-07 | Disposition: A | Discharge status: Home / Self Care | Payer: Medicare Other | Source: Ambulatory Visit | Attending: Family Medicine | Admitting: Family Medicine

## 2018-09-07 DIAGNOSIS — M7989 Other specified soft tissue disorders: Secondary | ICD-10-CM | POA: Diagnosis not present

## 2018-09-07 DIAGNOSIS — R2242 Localized swelling, mass and lump, left lower limb: Secondary | ICD-10-CM | POA: Insufficient documentation

## 2018-09-10 MED ORDER — LORATADINE 10 MG PO TABS: 10.0000 mg | ORAL_TABLET | Freq: Every day | ORAL | 0 refills | Status: DC

## 2018-09-10 MED ORDER — OMEPRAZOLE 20 MG PO CPDR: 20.0000 mg | DELAYED_RELEASE_CAPSULE | Freq: Every day | ORAL | 0 refills | Status: DC

## 2018-09-10 NOTE — Telephone Encounter (Signed)
Called and spoke with patient. Pt already had his US done on 09/07/2018 pt would like a call back ASAP once PCP has had a chance to review results.   Pt is willing to start the omeprazole and Claritin [facility].

## 2018-09-10 NOTE — Telephone Encounter (Signed)
Called pt and phone line just kept ringing will try to call back again in the morning.

## 2018-09-10 NOTE — Addendum Note (Signed)
Addended by: Myriam Forehand on: 09/10/2018 11:22 AM   Modules accepted: Orders

## 2018-09-10 NOTE — Addendum Note (Signed)
Addended by: Leone Haven on: 09/10/2018 12:32 PM   Modules accepted: Orders

## 2018-09-10 NOTE — Telephone Encounter (Signed)
Please let the patient know that the radiologist did see a soft tissue mass in the area of clinical concern.  They were uncertain of the cause of this though they suspect it is related to his muscle.  They noted it could represent a focal muscular injury or inflammation and was less likely to represent a tumor.  They recommended an MRI to evaluate this area to determine a specific cause.  I would recommend we proceed with the MRI and once you speak with him I can place this order.  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  I have sent the omeprazole and Claritin to the pharmacy.  It appears he has an appointment with me in late March.  He should keep that appointment for reevaluation.  If his cough symptoms do not improve over the next 2 weeks he should contact us.  Thanks.

## 2018-09-11 ENCOUNTER — Telehealth: Payer: Self-pay

## 2018-09-11 NOTE — Addendum Note (Signed)
Addended by: Leone Haven on: 09/11/2018 05:34 PM   Modules accepted: Orders

## 2018-09-11 NOTE — Telephone Encounter (Signed)
Copied from [place] [id number]. Topic: General - Inquiry >> Sep 11, 2018  3:47 PM Virl Axe D wrote: Reason for CRM: Pt called to get results of Ultrasound. Please advise. CB#[phone]

## 2018-09-11 NOTE — Telephone Encounter (Signed)
Called and spoke with patient. Pt advised and would like to do the MRI.   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   Pt stated that he has not cough and seems to be doing well with new medications.

## 2018-09-11 NOTE — Telephone Encounter (Addendum)
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  I have placed an order for this.  He can come in to have this done at any time.  Once this is completed I can place the order for the MRI.  Thanks.

## 2018-09-11 NOTE — Telephone Encounter (Signed)
Called and spoke with pt. Pt has been advised of results. Pt voiced understanding.

## 2018-09-11 NOTE — Telephone Encounter (Signed)
Called and spoke with patient. Pt advised and voiced understanding.  

## 2018-09-12 ENCOUNTER — Ambulatory Visit (INDEPENDENT_AMBULATORY_CARE_PROVIDER_SITE_OTHER): Payer: Medicare Other

## 2018-09-12 ENCOUNTER — Telehealth: Payer: Self-pay

## 2018-09-12 DIAGNOSIS — R2242 Localized swelling, mass and lump, left lower limb: Secondary | ICD-10-CM

## 2018-09-12 DIAGNOSIS — Z135 Encounter for screening for eye and ear disorders: Secondary | ICD-10-CM | POA: Diagnosis not present

## 2018-09-12 NOTE — Telephone Encounter (Signed)
Orders placed yesterday

## 2018-09-12 NOTE — Telephone Encounter (Signed)
Pt came into the office today at 12 and was advised to come back after 1 PM since x-ray orders have not been placed by PCP. Pt plans to come back in later to get this done today.   Sent to PCP to place x-ray orders that were needed to check for metal before he does his MRI

## 2018-09-13 ENCOUNTER — Telehealth: Payer: Self-pay | Admitting: Family Medicine

## 2018-09-13 DIAGNOSIS — M7989 Other specified soft tissue disorders: Secondary | ICD-10-CM

## 2018-09-13 DIAGNOSIS — R2240 Localized swelling, mass and lump, unspecified lower limb: Principal | ICD-10-CM

## 2018-09-13 NOTE — Telephone Encounter (Signed)
Please let the patient know that his x-ray did not reveal any metal in his orbits.  I have placed an order for his MRI.  Please confirm that he does not have a pacemaker.  We will get his MRI scheduled for him.  Thanks.

## 2018-09-13 NOTE — Telephone Encounter (Signed)
Called and spoke with patient. Pt advised and voiced understanding.  

## 2018-09-13 NOTE — Telephone Encounter (Signed)
Called and spoke with patient. Pt advised and voiced understanding. Pt does not have a pacemaker.

## 2018-09-13 NOTE — Telephone Encounter (Signed)
Noted. If he does not hear anything regarding the MRI in the next week he should call us. Thanks.

## 2018-09-19 DIAGNOSIS — L2081 Atopic neurodermatitis: Secondary | ICD-10-CM | POA: Diagnosis not present

## 2018-09-19 DIAGNOSIS — L820 Inflamed seborrheic keratosis: Secondary | ICD-10-CM | POA: Diagnosis not present

## 2018-09-19 DIAGNOSIS — L853 Xerosis cutis: Secondary | ICD-10-CM | POA: Diagnosis not present

## 2018-09-25 DIAGNOSIS — I6523 Occlusion and stenosis of bilateral carotid arteries: Secondary | ICD-10-CM | POA: Diagnosis not present

## 2018-09-25 DIAGNOSIS — I70219 Atherosclerosis of native arteries of extremities with intermittent claudication, unspecified extremity: Secondary | ICD-10-CM | POA: Diagnosis not present

## 2018-09-26 ENCOUNTER — Ambulatory Visit
Admission: RE | Admit: 2018-09-26 | Discharge: 2018-09-26 | Disposition: A | Discharge status: Home / Self Care | Payer: Medicare Other | Source: Ambulatory Visit | Attending: Family Medicine | Admitting: Family Medicine

## 2018-09-26 DIAGNOSIS — D1779 Benign lipomatous neoplasm of other sites: Secondary | ICD-10-CM | POA: Diagnosis not present

## 2018-09-26 DIAGNOSIS — R2240 Localized swelling, mass and lump, unspecified lower limb: Secondary | ICD-10-CM | POA: Insufficient documentation

## 2018-09-26 DIAGNOSIS — M7989 Other specified soft tissue disorders: Secondary | ICD-10-CM

## 2018-09-26 MED ORDER — GADOBUTROL 1 MMOL/ML IV SOLN
9.0000 mL | Freq: Once | INTRAVENOUS | Status: AC | PRN
  Administered 2018-09-26: 9 mL via INTRAVENOUS

## 2018-09-30 ENCOUNTER — Other Ambulatory Visit: Payer: Self-pay | Admitting: Family Medicine

## 2018-09-30 DIAGNOSIS — D1724 Benign lipomatous neoplasm of skin and subcutaneous tissue of left leg: Secondary | ICD-10-CM

## 2018-10-01 ENCOUNTER — Ambulatory Visit: Payer: Medicare Other | Attending: Family Medicine

## 2018-10-01 ENCOUNTER — Other Ambulatory Visit: Payer: Self-pay

## 2018-10-01 DIAGNOSIS — G8929 Other chronic pain: Secondary | ICD-10-CM | POA: Diagnosis not present

## 2018-10-01 DIAGNOSIS — M545 Low back pain, unspecified: Secondary | ICD-10-CM

## 2018-10-01 DIAGNOSIS — R262 Difficulty in walking, not elsewhere classified: Secondary | ICD-10-CM | POA: Diagnosis not present

## 2018-10-01 DIAGNOSIS — M6281 Muscle weakness (generalized): Secondary | ICD-10-CM | POA: Insufficient documentation

## 2018-10-01 NOTE — Patient Instructions (Signed)
[employer] Access Code: [email or web address]   Seated Scapular Retraction  2-3x5 with 5 second holds

## 2018-10-01 NOTE — Therapy (Signed)
[name] [address], [place], [address] Phone: [phone]   Fax:  [phone]  Physical Therapy Evaluation  Patient Details  Name: Cody Bridges MRN: [id number] Date of Birth: 13-Jun-1933 Referring Provider (PT): [name], MD   Encounter Date: 10/01/2018  PT End of Session - 10/01/18 0937    Visit Number  1    Number of Visits  17    Date for PT Re-Evaluation  11/29/18    Authorization Type  1    Authorization Time Period  of 10 (10/01/2018)    PT Start Time  0937    PT Stop Time  1046    PT Time Calculation (min)  69 min    Equipment Utilized During Treatment  Gait belt    Activity Tolerance  Patient tolerated treatment well    Behavior During Therapy  [facility] for tasks assessed/performed       Past Medical History:  Diagnosis Date  . Arthritis   . Asthma   . COPD (chronic obstructive pulmonary disease) ([place])   . Diabetes mellitus without complication ([place])     3149  . Dyspnea    with exertion  . Edema   . GERD (gastroesophageal reflux disease)   . HOH (hard of hearing)    AIDS  . Hyperlipidemia   . Hypertension   . Wheezing     Past Surgical History:  Procedure Laterality Date  . BUNIONECTOMY Right   . CATARACT EXTRACTION W/PHACO Left 08/22/2017   Procedure: CATARACT EXTRACTION PHACO AND INTRAOCULAR LENS PLACEMENT (IOC);  Surgeon: Birder Robson, MD;  Location: [facility] ORS;  Service: Ophthalmology;  Laterality: Left;  [place] 00:43.7 AP% 12.3 CDE 5.38 Fluid pack lot # [id number]  . COLONOSCOPY  2016  . ELECTROMAGNETIC NAVIGATION BROCHOSCOPY N/A 02/09/2017   Procedure: ELECTROMAGNETIC NAVIGATION BRONCHOSCOPY;  Surgeon: Flora Lipps, MD;  Location: [facility] ORS;  Service: Cardiopulmonary;  Laterality: N/A;  . EYE SURGERY Right    Cataract Extraction with IOL  . HAMMER TOE SURGERY Right   . TONSILLECTOMY  1938    There were no vitals filed for this visit.   Subjective Assessment -  10/01/18 0944    Subjective  low back: 5/10 currently (pt sitting on a chair), 9/10 at worst for the past 3 months. L thigh: 3/10 currently, 9/10 at worst for the past 3 months    Pertinent History  B LE weakness. Had a lump L anterior thigh for a few years. Gave hip a fit for the past month when moving. Had imaging and is scheduling surgery to remove it.  Not cancerous as far as pt knows.  Pt states that he noticed his LE getting weaker for the past few years. Goes down steps and up steps one step at a time.  Has a little low back pain for quite a while which comes and goes.  Pt also states that his heart kind of flutters and his doctors (cardiologist and PCP) know about it.   Low back pain is predominantly on L side.     Patient Stated Goals  Breathe better, go up and down steps better.     Currently in Pain?  Yes    Pain Score  5    low back pain    Aggravating Factors   Difficulty with stair negotiation, breathing (getting treatment for breathing; taking medications which helps; no chest pains), walking (L thigh; around L4 dermatome; bothers his thigh) about 30 to  45 minutes.  Low back: standing still 5-20 minutes.  Difficulty crossing his legs.     Pain Relieving Factors  rest; low back: walking, moving         [facility] PT Assessment - 10/01/18 0938      Assessment   Medical Diagnosis  Weakness of both LE    Referring Provider (PT)  [name], MD    Onset Date/Surgical Date  08/29/18   date PT referral signed; chronic condition   Prior Therapy  No known PT for current condition      Precautions   Precaution Comments  No known precautions      Restrictions   Other Position/Activity Restrictions  no known weight bearing restrictions      Balance Screen   Has the patient fallen in the past 6 months  No    Has the patient had a decrease in activity level because of a fear of falling?   Yes    Is the patient reluctant to leave their home because of a fear of falling?   No   Pt  states having a fear of falling     Home Environment   Additional Comments  Pt lives in the ground floor of an appartment, no steps. Lives with his significant other.       Prior Function   Vocation  Retired    Biomedical scientist  PLOF: less difficulty ambulating, negotiating stairs, tolerating standing      Posture/Postural Control   Posture Comments  B protracted and shrugged shoulders, R lateral shift. R shoulder higher, decreased lumbar lordosis, L hip higher than R. Decreased thoracic extension, forward flexed, decreased B hip extension      AROM   Lumbar Flexion  Limited with L low back pain    Lumbar Extension  limited with reproduction of L thigh pain.     Lumbar - Right Side Bend  limited    Lumbar - Left Side Bend  limited with L low back pain    Lumbar - Right Rotation  WFL   performed in sitting   Lumbar - Left Rotation  WFL   performed in sitting     Strength   Right Hip Flexion  4-/5    Right Hip Extension  4-/5   seated manually resisted   Right Hip ABduction  4/5   seated manually resisted clamshell   Left Hip Flexion  3+/5    Left Hip Extension  3+/5   seated manually resisted   Left Hip ABduction  4/5   seated manually resisted clamshell   Right Knee Flexion  4/5    Right Knee Extension  5/5    Left Knee Flexion  4/5    Left Knee Extension  5/5      Palpation   Palpation comment  B lumbar paraspinal muscle tension      Ambulation/Gait   Gait Comments  antalgic, decreased stance R LE, B lateral lean, decreased B hip extension, forward flexed.                 Objective measurements completed on examination: See above findings.     No known latex band allergies   Blood pressure, L arm sitting, normal cuff, mechanically taken: 125/50, HR  48    After manual muscle test B LE:  SpO2, room air 97%, HR 78 BPM   6 minute walk  600 ft in 4 minutes. Rest break needed  97% SpO2, 88-102 BPM  Blood pressure, L arm sitting, mechanically  taken, normal cuff. 130/67, HR 50 BPM (51 BPM manually taken with arhythmia palpated; pt states being treated by his doctors, no restrictions on activities provided)   Medbridge Access Code: [email or web address]     Therapeutic exercise  Seated L hip extension isometrics 10x5 seconds for 3 sets  Seated manual R lateral shift correction. No change in back discomfort  Seated B scapular retraction 5x3 with 5 second holds    Improved exercise technique, movement at target joints, use of target muscles after mod verbal, visual, tactile cues.   Patient is an 85 year old male who came to physical therapy secondary to B LE weakness. He also presents with low back pain, decreased endurance, mass L anterior thigh (pt scheduling surgery), altered gait pattern and posture, low back muscle tension, and difficulty performing functional tasks such as walking for longer periods and negotiating stairs. Pt will benefit from skilled physical therapy services to address the aforementioned deficits.       PT Education - 10/01/18 1058    Education Details  Ther-ex, HEP, plan of care    Person(s) Educated  Patient    Methods  Explanation;Demonstration;Tactile cues;Verbal cues;Handout    Comprehension  Returned demonstration;Verbalized understanding       PT Short Term Goals - 10/01/18 1108      PT SHORT TERM GOAL #1   Title  Pt will be independent with his HEP to improve strength, function, endurance, and decrease back pain.     Baseline  Pt has started his HEP (10/01/2018)    Time  3    Period  Weeks    Status  New    Target Date  10/25/18        PT Long Term Goals - 10/01/18 1223      PT LONG TERM GOAL #1   Title  Patient will improve B LE strength by at least 1 MMT grade to promote ability to perform standing tasks, ambulate, negotiate stairs with less difficulty.     Time  8    Period  Weeks    Status  New    Target Date  11/29/18      PT LONG TERM GOAL #2   Title  Patient will improve his 6  minute walk time to 6 minutes and distance to 800 ft or more to promote mobility and endurance.     Baseline  6 minute walk test: 4 minutes, 600 ft (10/01/2018)    Time  8    Period  Weeks    Status  New    Target Date  11/29/18      PT LONG TERM GOAL #3   Title  Pt will have a decrease in low back pain 5/10 or less at worst to promote ability to perform standing tasks more comfortably.     Baseline  9/10 low back pain at worst for the past 3 months (10/01/2018)    Time  8    Period  Weeks    Status  New    Target Date  11/29/18      PT LONG TERM GOAL #4   Title  Pt will report less difficulty going up and down steps overall to promote function.     Baseline  Difficulty going up and down steps per pt reports (10/01/2018)    Time  8    Period  Weeks    Status  New    Target Date  11/29/18  Plan - 10/01/18 1059    Clinical Impression Statement  Patient is an 85 year old male who came to physical therapy secondary to B LE weakness. He also presents with low back pain, decreased endurance, mass L anterior thigh (pt scheduling surgery), altered gait pattern and posture, low back muscle tension, and difficulty performing functional tasks such as walking for longer periods and negotiating stairs. Pt will benefit from skilled physical therapy services to address the aforementioned deficits.     Personal Factors and Comorbidities  Age;Fitness;Time since onset of injury/illness/exacerbation;Comorbidity 3+   weakness, COPD, shortness or breath, L thigh mass   Comorbidities  COPD, shortness or breath, heart palpitations/fluttering per pt, weakness, low back pain    Examination-Activity Limitations  Stairs;Stand;Squat;Other   walking   Examination-Participation Restrictions  Community Activity;Laundry;Cleaning;Other   other chores, standing activities   Stability/Clinical Decision Making  Evolving/Moderate complexity   increasing weakness since onset per pt subjective   Clinical  Decision Making  Moderate    Rehab Potential  Fair    PT Frequency  2x / week    PT Duration  8 weeks    PT Treatment/Interventions  Therapeutic exercise;Balance training;Neuromuscular re-education;Gait training;Stair training;Functional mobility training;Therapeutic activities;Patient/family education;Manual techniques;Dry needling;Aquatic Therapy;Electrical Stimulation;Iontophoresis 4mg /ml Dexamethasone    PT Next Visit Plan  scapular, trunk, hip strengthening, endurance, thoracic and hip extension, manual techniques, modalities PRN    Consulted and Agree with Plan of Care  Patient       Patient will benefit from skilled therapeutic intervention in order to improve the following deficits and impairments:  Pain, Postural dysfunction, Improper body mechanics, Difficulty walking, Decreased strength, Decreased mobility, Decreased endurance, Decreased activity tolerance, Decreased balance, Abnormal gait  Visit Diagnosis: Muscle weakness (generalized) - Plan: PT plan of care cert/re-cert  Difficulty in walking, not elsewhere classified - Plan: PT plan of care cert/re-cert  Chronic left-sided low back pain, unspecified whether sciatica present - Plan: PT plan of care cert/re-cert     Problem List Patient Active Problem List   Diagnosis Date Noted  . Mass of left thigh 08/29/2018  . Palpitations 08/29/2018  . Cough 08/29/2018  . Premature atrial complex 07/04/2018  . Decreased range of right hip movement 07/04/2018  . Urine frequency 03/06/2018  . Benign prostatic hyperplasia with urinary frequency 03/06/2018  . Itching 09/21/2017  . Weakness of both lower extremities 05/01/2017  . Orthostasis 05/01/2017  . Lung mass   . Bilateral leg edema 01/26/2017  . Polyuria 01/26/2017  . Bruising 01/26/2017  . Diarrhea 10/25/2016  . Rash and nonspecific skin eruption 10/25/2016  . Diastasis recti 10/24/2016  . Non-recurrent unilateral inguinal hernia without obstruction or gangrene  10/24/2016  . Hypertension 10/24/2016  . Diabetes mellitus without complication ([place]) 24/26/8341  . COPD (chronic obstructive pulmonary disease) ([place]) 09/20/2016  . SOBOE (shortness of breath on exertion) 08/30/2016  . Colon polyps 08/29/2014  . Hyperlipemia, mixed 02/05/2014   Joneen Boers PT, DPT   10/01/2018, 12:44 PM  [facility] [address] [place], [address] Phone: [phone]   Fax:  [phone]  Name: Cody Bridges MRN: [id number] Date of Birth: Oct 31, 1932

## 2018-10-03 DIAGNOSIS — L2089 Other atopic dermatitis: Secondary | ICD-10-CM | POA: Diagnosis not present

## 2018-10-03 DIAGNOSIS — L853 Xerosis cutis: Secondary | ICD-10-CM | POA: Diagnosis not present

## 2018-10-04 ENCOUNTER — Ambulatory Visit: Payer: Medicare Other

## 2018-10-04 DIAGNOSIS — M545 Low back pain, unspecified: Secondary | ICD-10-CM

## 2018-10-04 DIAGNOSIS — R0602 Shortness of breath: Secondary | ICD-10-CM | POA: Diagnosis not present

## 2018-10-04 DIAGNOSIS — R9431 Abnormal electrocardiogram [ECG] [EKG]: Secondary | ICD-10-CM | POA: Diagnosis not present

## 2018-10-04 DIAGNOSIS — I10 Essential (primary) hypertension: Secondary | ICD-10-CM | POA: Diagnosis not present

## 2018-10-04 DIAGNOSIS — M6281 Muscle weakness (generalized): Secondary | ICD-10-CM

## 2018-10-04 DIAGNOSIS — E782 Mixed hyperlipidemia: Secondary | ICD-10-CM | POA: Diagnosis not present

## 2018-10-04 DIAGNOSIS — I6523 Occlusion and stenosis of bilateral carotid arteries: Secondary | ICD-10-CM | POA: Diagnosis not present

## 2018-10-04 DIAGNOSIS — G8929 Other chronic pain: Secondary | ICD-10-CM | POA: Diagnosis not present

## 2018-10-04 DIAGNOSIS — R262 Difficulty in walking, not elsewhere classified: Secondary | ICD-10-CM | POA: Diagnosis not present

## 2018-10-04 DIAGNOSIS — I70219 Atherosclerosis of native arteries of extremities with intermittent claudication, unspecified extremity: Secondary | ICD-10-CM | POA: Diagnosis not present

## 2018-10-04 NOTE — Patient Instructions (Signed)
Medbridge Access Code: [email or web address]   LAQ  10x2 with 5 seconds each LE

## 2018-10-04 NOTE — Therapy (Signed)
[facility] [address], [place], [address] Phone: [phone]   Fax:  [phone]  Physical Therapy Treatment  Patient Details  Name: Cody Bridges MRN: [id number] Date of Birth: [birth year] Referring Provider (PT): [name], MD   Encounter Date: 10/04/2018  PT End of Session - 10/04/18 1435    Visit Number  2    Number of Visits  17    Date for PT Re-Evaluation  11/29/18    Authorization Type  2    Authorization Time Period  of 10 (10/01/2018)    PT Start Time  1435    PT Stop Time  1516    PT Time Calculation (min)  41 min    Equipment Utilized During Treatment  Gait belt    Activity Tolerance  Patient tolerated treatment well    Behavior During Therapy  [facility] for tasks assessed/performed       Past Medical History:  Diagnosis Date  . Arthritis   . Asthma   . COPD (chronic obstructive pulmonary disease) ([place])   . Diabetes mellitus without complication ([place])     3474  . Dyspnea    with exertion  . Edema   . GERD (gastroesophageal reflux disease)   . HOH (hard of hearing)    AIDS  . Hyperlipidemia   . Hypertension   . Wheezing     Past Surgical History:  Procedure Laterality Date  . BUNIONECTOMY Right   . CATARACT EXTRACTION W/PHACO Left 08/22/2017   Procedure: CATARACT EXTRACTION PHACO AND INTRAOCULAR LENS PLACEMENT (IOC);  Surgeon: Birder Robson, MD;  Location: [facility] ORS;  Service: Ophthalmology;  Laterality: Left;  [place] 00:43.7 AP% 12.3 CDE 5.38 Fluid pack lot # [id number]  . COLONOSCOPY  2016  . ELECTROMAGNETIC NAVIGATION BROCHOSCOPY N/A 02/09/2017   Procedure: ELECTROMAGNETIC NAVIGATION BRONCHOSCOPY;  Surgeon: Flora Lipps, MD;  Location: [facility] ORS;  Service: Cardiopulmonary;  Laterality: N/A;  . EYE SURGERY Right    Cataract Extraction with IOL  . HAMMER TOE SURGERY Right   . TONSILLECTOMY  1938    There were no vitals filed for this visit.  Subjective Assessment - 10/04/18  1436    Subjective  Doing ok. Feels a lump L lower lateral abdomen which started yesterday. Feels a strain there when he gets up and walk.  Feels the same as yesterday. Started either Tuesday or Wednesday.     Pertinent History  B LE weakness. Had a lump L anterior thigh for a few years. Gave hip a fit for the past month when moving. Had imaging and is scheduling surgery to remove it.  Not cancerous as far as pt knows.  Pt states that he noticed his LE getting weaker for the past few years. Goes down steps and up steps one step at a time.  Has a little low back pain for quite a while which comes and goes.  Pt also states that his heart kind of flutters and his doctors (cardiologist and PCP) know about it.   Low back pain is predominantly on L side.     Patient Stated Goals  Breathe better, go up and down steps better.     Currently in Pain?  No/denies    Pain Score  0-No pain                               PT Education - 10/04/18 1443  Education Details  ther-ex, HEP    Person(s) Educated  Patient    Methods  Explanation;Demonstration;Tactile cues;Verbal cues;Handout    Comprehension  Returned demonstration;Verbalized understanding      Objective      No known latex band allergies    Possible muscle knot palpated L lower lateral abdomen  Medbridge Access Code: [email or web address]    Therapeutic exercise  Seated L hip extension isometrics 10x5 seconds for 3 sets  Seated B shoulder extension isometrics 10x5 seconds for 3 sets to promote trunk muscle strength   No change in L lower abdominal symptoms with aforementioned exercises.    Running man with contralateral foot on furniture slider and one UE assist   L 5x3  R 5x  Difficult. Rest break provided afterwards  Seated B scapular retraction 10x2 with 5 second holds  LAQ   R 10x5 seconds, then 7x5 seconds   L 10x 5 seconds then 7x5 second  Standing hip abduction with B UE assist   R 10x  L 7x     Seated heel toe raises  10x2 each way   Improved exercise technique, movement at target joints, use of target muscles after mod verbal, visual, tactile cues    Response to treatment Therapeutic rest breaks provided secondary to fatigue. Good muscle use felt with exercises    Clinical impression Pt demonstrates decreased activity tolerance, needing therapeutic restbreaks in which deconditioning may play a factor. Worked on trunk and LE strengthening to promote ability to perform standing tasks with less difficulty. Pt will benefit from continued skilled physical therapy services to improve strength, endurance and function.       PT Short Term Goals - 10/01/18 1108      PT SHORT TERM GOAL #1   Title  Pt will be independent with his HEP to improve strength, function, endurance, and decrease back pain.     Baseline  Pt has started his HEP (10/01/2018)    Time  3    Period  Weeks    Status  New    Target Date  10/25/18        PT Long Term Goals - 10/01/18 1223      PT LONG TERM GOAL #1   Title  Patient will improve B LE strength by at least 1 MMT grade to promote ability to perform standing tasks, ambulate, negotiate stairs with less difficulty.     Time  8    Period  Weeks    Status  New    Target Date  11/29/18      PT LONG TERM GOAL #2   Title  Patient will improve his 6 minute walk time to 6 minutes and distance to 800 ft or more to promote mobility and endurance.     Baseline  6 minute walk test: 4 minutes, 600 ft (10/01/2018)    Time  8    Period  Weeks    Status  New    Target Date  11/29/18      PT LONG TERM GOAL #3   Title  Pt will have a decrease in low back pain 5/10 or less at worst to promote ability to perform standing tasks more comfortably.     Baseline  9/10 low back pain at worst for the past 3 months (10/01/2018)    Time  8    Period  Weeks    Status  New    Target Date  11/29/18      PT LONG  TERM GOAL #4   Title  Pt will report less difficulty  going up and down steps overall to promote function.     Baseline  Difficulty going up and down steps per pt reports (10/01/2018)    Time  8    Period  Weeks    Status  New    Target Date  11/29/18            Plan - 10/04/18 1443    Clinical Impression Statement  Pt demonstrates decreased activity tolerance, needing therapeutic restbreaks in which deconditioning may play a factor. Worked on trunk and LE strengthening to promote ability to perform standing tasks with less difficulty. Pt will benefit from continued skilled physical therapy services to improve strength, endurance and function.     Personal Factors and Comorbidities  Age;Fitness;Time since onset of injury/illness/exacerbation;Comorbidity 3+   weakness, COPD, shortness or breath, L thigh mass   Comorbidities  COPD, shortness or breath, heart palpitations/fluttering per pt, weakness, low back pain    Examination-Activity Limitations  Stairs;Stand;Squat;Other   walking   Examination-Participation Restrictions  Community Activity;Laundry;Cleaning;Other   other chores, standing activities   Stability/Clinical Decision Making  Evolving/Moderate complexity   increasing weakness since onset per pt subjective   Rehab Potential  Fair    PT Frequency  2x / week    PT Duration  8 weeks    PT Treatment/Interventions  Therapeutic exercise;Balance training;Neuromuscular re-education;Gait training;Stair training;Functional mobility training;Therapeutic activities;Patient/family education;Manual techniques;Dry needling;Aquatic Therapy;Electrical Stimulation;Iontophoresis 4mg /ml Dexamethasone    PT Next Visit Plan  scapular, trunk, hip strengthening, endurance, thoracic and hip extension, manual techniques, modalities PRN    Consulted and Agree with Plan of Care  Patient       Patient will benefit from skilled therapeutic intervention in order to improve the following deficits and impairments:  Pain, Postural dysfunction, Improper body  mechanics, Difficulty walking, Decreased strength, Decreased mobility, Decreased endurance, Decreased activity tolerance, Decreased balance, Abnormal gait  Visit Diagnosis: Muscle weakness (generalized)  Difficulty in walking, not elsewhere classified  Chronic left-sided low back pain, unspecified whether sciatica present     Problem List Patient Active Problem List   Diagnosis Date Noted  . Mass of left thigh 08/29/2018  . Palpitations 08/29/2018  . Cough 08/29/2018  . Premature atrial complex 07/04/2018  . Decreased range of right hip movement 07/04/2018  . Urine frequency 03/06/2018  . Benign prostatic hyperplasia with urinary frequency 03/06/2018  . Itching 09/21/2017  . Weakness of both lower extremities 05/01/2017  . Orthostasis 05/01/2017  . Lung mass   . Bilateral leg edema 01/26/2017  . Polyuria 01/26/2017  . Bruising 01/26/2017  . Diarrhea 10/25/2016  . Rash and nonspecific skin eruption 10/25/2016  . Diastasis recti 10/24/2016  . Non-recurrent unilateral inguinal hernia without obstruction or gangrene 10/24/2016  . Hypertension 10/24/2016  . Diabetes mellitus without complication ([place]) 58/85/0277  . COPD (chronic obstructive pulmonary disease) ([place]) 09/20/2016  . SOBOE (shortness of breath on exertion) 08/30/2016  . Colon polyps 08/29/2014  . Hyperlipemia, mixed 02/05/2014    Joneen Boers PT, DPT   10/04/2018, 4:38 PM  [facility] [address] [place], [address] Phone: [phone]   Fax:  [phone]  Name: Cody Bridges MRN: [id number] Date of Birth: 1932/08/31

## 2018-10-05 ENCOUNTER — Ambulatory Visit: Payer: Medicare Other | Admitting: Family Medicine

## 2018-10-05 DIAGNOSIS — I6523 Occlusion and stenosis of bilateral carotid arteries: Secondary | ICD-10-CM | POA: Insufficient documentation

## 2018-10-08 ENCOUNTER — Ambulatory Visit: Payer: Medicare Other

## 2018-10-08 DIAGNOSIS — M545 Low back pain, unspecified: Secondary | ICD-10-CM

## 2018-10-08 DIAGNOSIS — R262 Difficulty in walking, not elsewhere classified: Secondary | ICD-10-CM | POA: Diagnosis not present

## 2018-10-08 DIAGNOSIS — M6281 Muscle weakness (generalized): Secondary | ICD-10-CM

## 2018-10-08 DIAGNOSIS — G8929 Other chronic pain: Secondary | ICD-10-CM

## 2018-10-08 NOTE — Therapy (Signed)
[place] [facility] [address], [place], [address] Phone: [phone]   Fax:  [phone]  Physical Therapy Treatment  Patient Details  Name: Cody Bridges MRN: [id number] Date of Birth: 02-Aug-1932  Referring Provider (PT): [name], MD   Encounter Date: 10/08/2018  PT End of Session - 10/08/18 1023    Visit Number  3    Number of Visits  17    Date for PT Re-Evaluation  11/29/18    Authorization Type  3    Authorization Time Period  of 10 (10/01/2018)    PT Start Time  1023    PT Stop Time  1113    PT Time Calculation (min)  50 min    Equipment Utilized During Treatment  --    Activity Tolerance  Patient tolerated treatment well    Behavior During Therapy  [facility] for tasks assessed/performed       Past Medical History:  Diagnosis Date  . Arthritis   . Asthma   . COPD (chronic obstructive pulmonary disease) ([place])   . Diabetes mellitus without complication ([place])     0263  . Dyspnea    with exertion  . Edema   . GERD (gastroesophageal reflux disease)   . HOH (hard of hearing)    AIDS  . Hyperlipidemia   . Hypertension   . Wheezing     Past Surgical History:  Procedure Laterality Date  . BUNIONECTOMY Right   . CATARACT EXTRACTION W/PHACO Left 08/22/2017   Procedure: CATARACT EXTRACTION PHACO AND INTRAOCULAR LENS PLACEMENT (IOC);  Surgeon: Birder Robson, MD;  Location: [facility] ORS;  Service: Ophthalmology;  Laterality: Left;  [place] 00:43.7 AP% 12.3 CDE 5.38 Fluid pack lot # [id number]  . COLONOSCOPY  2016  . ELECTROMAGNETIC NAVIGATION BROCHOSCOPY N/A 02/09/2017   Procedure: ELECTROMAGNETIC NAVIGATION BRONCHOSCOPY;  Surgeon: Flora Lipps, MD;  Location: [facility] ORS;  Service: Cardiopulmonary;  Laterality: N/A;  . EYE SURGERY Right    Cataract Extraction with IOL  . HAMMER TOE SURGERY Right   . TONSILLECTOMY  1938    There were no vitals filed for this visit.  Subjective Assessment - 10/08/18 1025     Subjective  Pt states doing good. The L lower abdominal pain went away after last session.     Pertinent History  B LE weakness. Had a lump L anterior thigh for a few years. Gave hip a fit for the past month when moving. Had imaging and is scheduling surgery to remove it.  Not cancerous as far as pt knows.  Pt states that he noticed his LE getting weaker for the past few years. Goes down steps and up steps one step at a time.  Has a little low back pain for quite a while which comes and goes.  Pt also states that his heart kind of flutters and his doctors (cardiologist and PCP) know about it.   Low back pain is predominantly on L side.     Patient Stated Goals  Breathe better, go up and down steps better.     Currently in Pain?  No/denies    Pain Score  0-No pain                               PT Education - 10/08/18 1027    Education Details  ther-ex, HEP    Person(s) Educated  Patient    Methods  Explanation;Demonstration;Tactile cues;Verbal cues;Handout    Comprehension  Verbalized understanding;Returned demonstration      Objective     No known latex band allergies   Possible muscle knot palpated L lower lateral abdomen  MedbridgeAccess Code: [email or web address]   Therapeutic exercise  Seated L hip extension isometrics 10x5 seconds for 3 sets  Seated hip hinging in preparation for deadlifts (to promote LE strengthening)  10x4  Reviewed and given as part of his HEP. Pt demonstrated and verbalized understanding. Handout provided  Sit <> stand from high mat table, emphasis on hip hinging.   27 inches 1x. Difficult  29 inches 4x (pt states he can perform about 3 more reps  afterwards suggesting about 75% max resistance/ 1 rep max for strengthening).    Rest breaks provided due to fatigue   Seated B scapular retraction 10x2 with 5 second holds to help decrease extension pressure to low back  LAQ to promote LE strengthening             R 10x5  seconds, then 7x5 seconds              L 10x5 seconds then 7x5 second   Standing hip abduction with B UE assist to promote glute med strengthening. Good muscle use felt.   R 7x2  L 7x2  Nustep seat 11, arms 11, level 1 for 3 minutes to promote cardiovascular endurance. Cues for pacing.    Improved exercise technique, movement at target joints, use of target muscles after min to mod verbal, visual, tactile cues.          Response to treatment Therapeutic rest breaks provided secondary to fatigue. Good muscle use felt with exercises   Clinical impression Continued working thoracic extension to help decreased low back extension pressure. Continued working on LE strengthening to promote ability to perform functional tasks with less difficulty. Rest breaks provided secondary to fatigue. Good response to previous session with pt stating that his L lateral abdominal discomfort disappeared after last session.  Pt will benefit from continued skilled physical therapy services to improve strength, endurance, and function.          PT Short Term Goals - 10/01/18 1108      PT SHORT TERM GOAL #1   Title  Pt will be independent with his HEP to improve strength, function, endurance, and decrease back pain.     Baseline  Pt has started his HEP (10/01/2018)    Time  3    Period  Weeks    Status  New    Target Date  10/25/18        PT Long Term Goals - 10/01/18 1223      PT LONG TERM GOAL #1   Title  Patient will improve B LE strength by at least 1 MMT grade to promote ability to perform standing tasks, ambulate, negotiate stairs with less difficulty.     Time  8    Period  Weeks    Status  New    Target Date  11/29/18      PT LONG TERM GOAL #2   Title  Patient will improve his 6 minute walk time to 6 minutes and distance to 800 ft or more to promote mobility and endurance.     Baseline  6 minute walk test: 4 minutes, 600 ft (10/01/2018)    Time  8    Period  Weeks     Status  New    Target Date  11/29/18  PT LONG TERM GOAL #3   Title  Pt will have a decrease in low back pain 5/10 or less at worst to promote ability to perform standing tasks more comfortably.     Baseline  9/10 low back pain at worst for the past 3 months (10/01/2018)    Time  8    Period  Weeks    Status  New    Target Date  11/29/18      PT LONG TERM GOAL #4   Title  Pt will report less difficulty going up and down steps overall to promote function.     Baseline  Difficulty going up and down steps per pt reports (10/01/2018)    Time  8    Period  Weeks    Status  New    Target Date  11/29/18            Plan - 10/08/18 1020    Clinical Impression Statement  Continued working thoracic extension to help decreased low back extension pressure. Continued working on LE strengthening to promote ability to perform functional tasks with less difficulty. Rest breaks provided secondary to fatigue. Good response to previous session with pt stating that his L lateral abdominal discomfort disappeared after last session.  Pt will benefit from continued skilled physical therapy services to improve strength, endurance, and function.     Personal Factors and Comorbidities  Age;Fitness;Time since onset of injury/illness/exacerbation;Comorbidity 3+   weakness, COPD, shortness or breath, L thigh mass   Comorbidities  COPD, shortness or breath, heart palpitations/fluttering per pt, weakness, low back pain    Examination-Activity Limitations  Stairs;Stand;Squat;Other   walking   Examination-Participation Restrictions  Community Activity;Laundry;Cleaning;Other   other chores, standing activities   Stability/Clinical Decision Making  Evolving/Moderate complexity   increasing weakness since onset per pt subjective   Rehab Potential  Fair    PT Frequency  2x / week    PT Duration  8 weeks    PT Treatment/Interventions  Therapeutic exercise;Balance training;Neuromuscular re-education;Gait  training;Stair training;Functional mobility training;Therapeutic activities;Patient/family education;Manual techniques;Dry needling;Aquatic Therapy;Electrical Stimulation;Iontophoresis 4mg /ml Dexamethasone    PT Next Visit Plan  scapular, trunk, hip strengthening, endurance, thoracic and hip extension, manual techniques, modalities PRN    Consulted and Agree with Plan of Care  Patient       Patient will benefit from skilled therapeutic intervention in order to improve the following deficits and impairments:  Pain, Postural dysfunction, Improper body mechanics, Difficulty walking, Decreased strength, Decreased mobility, Decreased endurance, Decreased activity tolerance, Decreased balance, Abnormal gait  Visit Diagnosis: Muscle weakness (generalized)  Difficulty in walking, not elsewhere classified  Chronic left-sided low back pain, unspecified whether sciatica present     Problem List Patient Active Problem List   Diagnosis Date Noted  . Mass of left thigh 08/29/2018  . Palpitations 08/29/2018  . Cough 08/29/2018  . Premature atrial complex 07/04/2018  . Decreased range of right hip movement 07/04/2018  . Urine frequency 03/06/2018  . Benign prostatic hyperplasia with urinary frequency 03/06/2018  . Itching 09/21/2017  . Weakness of both lower extremities 05/01/2017  . Orthostasis 05/01/2017  . Lung mass   . Bilateral leg edema 01/26/2017  . Polyuria 01/26/2017  . Bruising 01/26/2017  . Diarrhea 10/25/2016  . Rash and nonspecific skin eruption 10/25/2016  . Diastasis recti 10/24/2016  . Non-recurrent unilateral inguinal hernia without obstruction or gangrene 10/24/2016  . Hypertension 10/24/2016  . Diabetes mellitus without complication ([place]) 16/01/3709  . COPD (chronic obstructive pulmonary disease) (  [place]) 09/20/2016  . SOBOE (shortness of breath on exertion) 08/30/2016  . Colon polyps 08/29/2014  . Hyperlipemia, mixed 02/05/2014    Joneen Boers PT, DPT   10/08/2018,  12:34 PM  [facility] [address] [place], [address] Phone: [phone]   Fax:  [phone]  Name: Cody Bridges MRN: [id number] Date of Birth: 1933/01/03

## 2018-10-08 NOTE — Patient Instructions (Signed)
Seated hip hinging   Reviewed and given as part of his HEP. Pt demonstrated and verbalized understanding. Handout provided

## 2018-10-10 ENCOUNTER — Ambulatory Visit: Payer: Medicare Other

## 2018-10-10 ENCOUNTER — Other Ambulatory Visit: Payer: Self-pay

## 2018-10-10 DIAGNOSIS — G8929 Other chronic pain: Secondary | ICD-10-CM | POA: Diagnosis not present

## 2018-10-10 DIAGNOSIS — R262 Difficulty in walking, not elsewhere classified: Secondary | ICD-10-CM

## 2018-10-10 DIAGNOSIS — M6281 Muscle weakness (generalized): Secondary | ICD-10-CM

## 2018-10-10 DIAGNOSIS — M545 Low back pain, unspecified: Secondary | ICD-10-CM

## 2018-10-10 NOTE — Therapy (Signed)
[place] [facility] [address] [place], [address] Phone: [phone]   Fax:  [phone]  Physical Therapy Treatment  Patient Details  Name: Cody Bridges MRN: [id number] Date of Birth: 13-Nov-1932 Referring Provider (PT): [name], MD   Encounter Date: 10/10/2018  PT End of Session - 10/10/18 0937    Visit Number  4    Number of Visits  17    Date for PT Re-Evaluation  11/29/18    Authorization Type  4    Authorization Time Period  of 10 (10/01/2018)    PT Start Time  [phone]   pt arrived late   PT Stop Time  1017    PT Time Calculation (min)  40 min    Activity Tolerance  Patient tolerated treatment well    Behavior During Therapy  [facility] for tasks assessed/performed       Past Medical History:  Diagnosis Date  . Arthritis   . Asthma   . COPD (chronic obstructive pulmonary disease) ([place])   . Diabetes mellitus without complication ([place])     1607  . Dyspnea    with exertion  . Edema   . GERD (gastroesophageal reflux disease)   . HOH (hard of hearing)    AIDS  . Hyperlipidemia   . Hypertension   . Wheezing     Past Surgical History:  Procedure Laterality Date  . BUNIONECTOMY Right   . CATARACT EXTRACTION W/PHACO Left 08/22/2017   Procedure: CATARACT EXTRACTION PHACO AND INTRAOCULAR LENS PLACEMENT (IOC);  Surgeon: Birder Robson, MD;  Location: [facility] ORS;  Service: Ophthalmology;  Laterality: Left;  [place] 00:43.7 AP% 12.3 CDE 5.38 Fluid pack lot # [id number] H  . COLONOSCOPY  2016  . ELECTROMAGNETIC NAVIGATION BROCHOSCOPY N/A 02/09/2017   Procedure: ELECTROMAGNETIC NAVIGATION BRONCHOSCOPY;  Surgeon: Flora Lipps, MD;  Location: [facility] ORS;  Service: Cardiopulmonary;  Laterality: N/A;  . EYE SURGERY Right    Cataract Extraction with IOL  . HAMMER TOE SURGERY Right   . TONSILLECTOMY  1938    There were no vitals filed for this visit.  Subjective Assessment - 10/10/18 0938    Subjective  Doing well  today. Back was a little sore after last session. Did not last too long.     Pertinent History  B LE weakness. Had a lump L anterior thigh for a few years. Gave hip a fit for the past month when moving. Had imaging and is scheduling surgery to remove it.  Not cancerous as far as pt knows.  Pt states that he noticed his LE getting weaker for the past few years. Goes down steps and up steps one step at a time.  Has a little low back pain for quite a while which comes and goes.  Pt also states that his heart kind of flutters and his doctors (cardiologist and PCP) know about it.   Low back pain is predominantly on L side.     Patient Stated Goals  Breathe better, go up and down steps better.     Currently in Pain?  No/denies    Pain Score  0-No pain                               PT Education - 10/10/18 0940    Education Details  ther-ex    Person(s) Educated  Patient    Methods  Explanation;Demonstration;Tactile cues;Verbal cues  Comprehension  Returned demonstration;Verbalized understanding         Objective     No known latex band allergies   MedbridgeAccess Code: [email or web address]   Therapeutic exercise  Seated hip hinging in preparation for deadlifts (to promote LE strengthening)             10x4        Sit <> stand from high mat table, emphasis on hip hinging.              29 inches 5x, 4x, 5x                          Seated B scapular retraction10x2with 5 second holds to help decrease extension pressure to low back  LAQ to promote LE strengthening R 10x5 seconds L 10x5 seconds   Rest break afterwards due to fatigue  96% SPO2, HR 102 after aforementioned exercises   Standing deadlift (modified) no weight  5x. Slightly more difficult than medium level  Seated L hip extension isometrics 10x5 seconds for 3 sets  Standing hip abduction with B UE assist to promote glute med strengthening. Good muscle use felt.               R 10x             L 10x  96% SPO2 and 101 heart rate  Seated glute max squeeze 5x5 seconds    Improved exercise technique, movement at target joints, use of target muscles after mod verbal, visual, tactile cues.   Response to treatment Good muscle use felt with exercises. Pt needed therapeutic rest breaks after exercises due to fatigue and to allow for muscle recovery. Pt tolerated session well without aggravation of symptoms.   Clinical impression Continued working on LE strengthening to promote ability to perform standing tasks with less difficulty. Therapeutic rest breaks provided secondary to fatigue to allow for muscle recovery. Pt will benefit from continued skilled physical therapy services to improve strength, endurance and function.    PT Short Term Goals - 10/01/18 1108      PT SHORT TERM GOAL #1   Title  Pt will be independent with his HEP to improve strength, function, endurance, and decrease back pain.     Baseline  Pt has started his HEP (10/01/2018)    Time  3    Period  Weeks    Status  New    Target Date  10/25/18        PT Long Term Goals - 10/01/18 1223      PT LONG TERM GOAL #1   Title  Patient will improve B LE strength by at least 1 MMT grade to promote ability to perform standing tasks, ambulate, negotiate stairs with less difficulty.     Time  8    Period  Weeks    Status  New    Target Date  11/29/18      PT LONG TERM GOAL #2   Title  Patient will improve his 6 minute walk time to 6 minutes and distance to 800 ft or more to promote mobility and endurance.     Baseline  6 minute walk test: 4 minutes, 600 ft (10/01/2018)    Time  8    Period  Weeks    Status  New    Target Date  11/29/18      PT LONG TERM GOAL #3   Title  Pt will have a  decrease in low back pain 5/10 or less at worst to promote ability to perform standing tasks more comfortably.     Baseline  9/10 low back pain at worst for the past 3 months (10/01/2018)    Time  8     Period  Weeks    Status  New    Target Date  11/29/18      PT LONG TERM GOAL #4   Title  Pt will report less difficulty going up and down steps overall to promote function.     Baseline  Difficulty going up and down steps per pt reports (10/01/2018)    Time  8    Period  Weeks    Status  New    Target Date  11/29/18            Plan - 10/10/18 0941    Clinical Impression Statement  Continued working on LE strengthening to promote ability to perform standing tasks with less difficulty. Therapeutic rest breaks provided secondary to fatigue to allow for muscle recovery. Pt will benefit from continued skilled physical therapy services to improve strength, endurance and function.    Personal Factors and Comorbidities  Age;Fitness;Time since onset of injury/illness/exacerbation;Comorbidity 3+   weakness, COPD, shortness or breath, L thigh mass   Comorbidities  COPD, shortness or breath, heart palpitations/fluttering per pt, weakness, low back pain    Examination-Activity Limitations  Stairs;Stand;Squat;Other   walking   Examination-Participation Restrictions  Community Activity;Laundry;Cleaning;Other   other chores, standing activities   Stability/Clinical Decision Making  Evolving/Moderate complexity   increasing weakness since onset per pt subjective   Rehab Potential  Fair    PT Frequency  2x / week    PT Duration  8 weeks    PT Treatment/Interventions  Therapeutic exercise;Balance training;Neuromuscular re-education;Gait training;Stair training;Functional mobility training;Therapeutic activities;Patient/family education;Manual techniques;Dry needling;Aquatic Therapy;Electrical Stimulation;Iontophoresis 4mg /ml Dexamethasone    PT Next Visit Plan  scapular, trunk, hip strengthening, endurance, thoracic and hip extension, manual techniques, modalities PRN    Consulted and Agree with Plan of Care  Patient       Patient will benefit from skilled therapeutic intervention in order to  improve the following deficits and impairments:  Pain, Postural dysfunction, Improper body mechanics, Difficulty walking, Decreased strength, Decreased mobility, Decreased endurance, Decreased activity tolerance, Decreased balance, Abnormal gait  Visit Diagnosis: Muscle weakness (generalized)  Difficulty in walking, not elsewhere classified  Chronic left-sided low back pain, unspecified whether sciatica present     Problem List Patient Active Problem List   Diagnosis Date Noted  . Mass of left thigh 08/29/2018  . Palpitations 08/29/2018  . Cough 08/29/2018  . Premature atrial complex 07/04/2018  . Decreased range of right hip movement 07/04/2018  . Urine frequency 03/06/2018  . Benign prostatic hyperplasia with urinary frequency 03/06/2018  . Itching 09/21/2017  . Weakness of both lower extremities 05/01/2017  . Orthostasis 05/01/2017  . Lung mass   . Bilateral leg edema 01/26/2017  . Polyuria 01/26/2017  . Bruising 01/26/2017  . Diarrhea 10/25/2016  . Rash and nonspecific skin eruption 10/25/2016  . Diastasis recti 10/24/2016  . Non-recurrent unilateral inguinal hernia without obstruction or gangrene 10/24/2016  . Hypertension 10/24/2016  . Diabetes mellitus without complication ([place]) 33/54/5625  . COPD (chronic obstructive pulmonary disease) ([place]) 09/20/2016  . SOBOE (shortness of breath on exertion) 08/30/2016  . Colon polyps 08/29/2014  . Hyperlipemia, mixed 02/05/2014    Joneen Boers PT, DPT   10/10/2018, 6:47 PM  [place]  [facility] [address] [place], [address] Phone: [phone]   Fax:  [phone]  Name: Treyvonne Tata Perrow MRN: [id number] Date of Birth: Apr 11, 1933

## 2018-10-11 ENCOUNTER — Ambulatory Visit (INDEPENDENT_AMBULATORY_CARE_PROVIDER_SITE_OTHER): Payer: Medicare Other | Admitting: General Surgery

## 2018-10-11 ENCOUNTER — Encounter: Payer: Self-pay | Admitting: General Surgery

## 2018-10-11 ENCOUNTER — Other Ambulatory Visit: Payer: Self-pay

## 2018-10-11 VITALS — BP 151/76 | HR 105 | Temp 97.7°F | Resp 18 | Ht 70.0 in | Wt 214.8 lb

## 2018-10-11 DIAGNOSIS — D179 Benign lipomatous neoplasm, unspecified: Secondary | ICD-10-CM | POA: Diagnosis not present

## 2018-10-11 NOTE — Progress Notes (Signed)
Patient ID: Cody Bridges, male   DOB: 14-Mar-1933, 85 y.o.   MRN: [id number]  Chief Complaint  Patient presents with  . Follow-up    Lipoma left thigh    HPI Cody Bridges is a 85 y.o. male. Here today for left thigh lipoma. Mild discomfort today.  It has been there 3 years or longer but recently started having pain and discomfort. MRI done 09/26/18.  He feels like it has gotten smaller over the past couple of weeks. He is here with his friend Cody Bridges.  HPI  Past Medical History:  Diagnosis Date  . Arthritis   . Asthma   . COPD (chronic obstructive pulmonary disease) ([place])   . Diabetes mellitus without complication ([place])     8101  . Dyspnea    with exertion  . Edema   . GERD (gastroesophageal reflux disease)   . HOH (hard of hearing)    AIDS  . Hyperlipidemia   . Hypertension   . Wheezing     Past Surgical History:  Procedure Laterality Date  . BUNIONECTOMY Right   . CATARACT EXTRACTION W/PHACO Left 08/22/2017   Procedure: CATARACT EXTRACTION PHACO AND INTRAOCULAR LENS PLACEMENT (IOC);  Surgeon: Birder Robson, MD;  Location: [facility] ORS;  Service: Ophthalmology;  Laterality: Left;  [place] 00:43.7 AP% 12.3 CDE 5.38 Fluid pack lot # [id number]  . COLONOSCOPY  2016  . ELECTROMAGNETIC NAVIGATION BROCHOSCOPY N/A 02/09/2017   Procedure: ELECTROMAGNETIC NAVIGATION BRONCHOSCOPY;  Surgeon: Flora Lipps, MD;  Location: [facility] OR[facility];  Service: Cardiopulmonary;  Laterality: N/A;  . EYE SURGERY Right    Cataract Extraction with IOL  . HAMMER TOE SURGERY Right   . TONSILLECTOMY  1938    Family History  Problem Relation Age of Onset  . Cancer Mother   . Stomach cancer Mother   . Stomach cancer Sister     Social History Social History   Tobacco Use  . Smoking status: Former Smoker    Packs/day: 2.00    Years: 35.00    Pack years: 70.00    Types: Cigarettes    Last attempt to quit: 10/30/1985    Years since quitting: 32.9  . Smokeless tobacco: Never Used  Substance Use Topics   . Alcohol use: Yes    Comment: occasional  . Drug use: No    Allergies  Allergen Reactions  . Clindamycin Hives  . Lisinopril Cough  . Penicillin V Potassium Rash and Other (See Comments)    Has patient had a PCN reaction causing immediate rash, facial/tongue/throat swelling, SOB or lightheadedness with hypotension: Unknown Has patient had a PCN reaction causing severe rash involving mucus membranes or skin necrosis: Unknown Has patient had a PCN reaction that required hospitalization: No Has patient had a PCN reaction occurring within the last 10 years: No If all of the above answers are "NO", then may proceed with Cephalosporin use.     Current Outpatient Medications  Medication Sig Dispense Refill  . albuterol (PROVENTIL HFA;VENTOLIN HFA) 108 (90 Base) MCG/ACT inhaler Inhale 1-2 puffs into the lungs every 6 (six) hours as needed for wheezing or shortness of breath. 1 Inhaler 2  . budesonide-formoterol (SYMBICORT) 160-4.5 MCG/ACT inhaler Inhale 2 puffs into the lungs 2 (two) times daily as needed (for shortness of breath).    . calcium carbonate (TUMS - DOSED IN MG ELEMENTAL CALCIUM) 500 MG chewable tablet Chew 1-2 tablets by mouth 3 (three) times daily as needed (for acid reflux/indigestion.).    Marland Kitchen Cholecalciferol (VITAMIN D-3) 5000  units TABS Take 5,000 Units by mouth daily.     . DUPIXENT 300 MG/2ML SOSY     . EUCRISA 2 % OINT Apply 1 application topically daily as needed.  2  . fluticasone (FLONASE) 50 MCG/ACT nasal spray Place 2 sprays into both nostrils daily. 16 g 2  . hydrochlorothiazide (HYDRODIURIL) 25 MG tablet TAKE 1 TABLET BY MOUTH EVERY DAY 90 tablet 0  . loratadine (CLARITIN) 10 MG tablet Take 1 tablet (10 mg total) by mouth daily. 30 tablet 0  . metFORMIN (GLUCOPHAGE) 500 MG tablet TAKE 1 TABLET TWICE A DAY 180 tablet 5  . Multiple Vitamin (MULTIVITAMIN WITH MINERALS) TABS tablet Take 1 tablet by mouth daily.    Marland Kitchen omeprazole (PRILOSEC) 20 MG capsule Take 1 capsule  (20 mg total) by mouth daily. 30 capsule 0  . rosuvastatin (CRESTOR) 20 MG tablet TAKE 1 TABLET BY MOUTH EVERY DAY 90 tablet 0  . tamsulosin (FLOMAX) 0.4 MG CAPS capsule TAKE 1 CAPSULE BY MOUTH EVERY DAY 90 capsule 1  . TRELEGY ELLIPTA 100-62.5-25 MCG/INH AEPB TAKE 1 PUFF BY MOUTH EVERY DAY 120 each 2  . triamcinolone lotion (KENALOG) 0.1 % Apply 1 application topically 2 (two) times daily as needed. 120 mL 0   No current facility-administered medications for this visit.     Review of Systems Review of Systems  Constitutional: Negative.   Respiratory: Negative.   Cardiovascular: Negative.     Blood pressure (!) 151/76, pulse (!) 105, temperature 97.7 F (36.5 C), temperature source Temporal, resp. rate 18, height 5\' 10"  (1.778 m), weight 214 lb 12.8 oz (97.4 kg), SpO2 99 %.  Physical Exam Physical Exam Constitutional:      Appearance: Normal appearance.  HENT:     Mouth/Throat:     Pharynx: No oropharyngeal exudate.  Eyes:     General: No scleral icterus. Cardiovascular:     Pulses: Normal pulses.  Musculoskeletal:     Right lower leg: No edema.       Legs:  Skin:    General: Skin is warm and dry.     Comments: 8 x 8 cm left lower anterior thigh lipoma  Neurological:     Mental Status: He is alert and oriented to person, place, and time.     Motor: Motor function is intact.     Comments: Lower extremity motor strength 5/5+.  Psychiatric:        Mood and Affect: Mood normal.        Behavior: Behavior normal.     Data Reviewed MRI of the left thigh dated September 27, 2018 reviewed: IMPRESSION: 1. 2.8 x 2.2 x 8.3 cm intramuscular simple lipoma in the distal left rectus femoris muscle.  Assessment Intramuscular lipoma, presently asymptomatic.  Plan  Follow up as needed or if symptoms worsen or if he decides to have it removed. The patient is aware to call back for any questions or new concerns.    HPI, assessment, plan and physical exam has been scribed  under the direction and in the presence of Cody Bellow, MD. Cody Fetch, RN  HPI, Physical Exam, Assessment and Plan have been scribed under the direction and in the presence of Cody Bellow, MD. Cody Bridges, Cody Bridges 10/12/2018, 7:41 AM

## 2018-10-11 NOTE — Patient Instructions (Addendum)
The patient is aware to call back for any questions or new concerns. Follow up as needed or if symptoms worsen or if he decides to have it removed.

## 2018-10-12 DIAGNOSIS — D179 Benign lipomatous neoplasm, unspecified: Secondary | ICD-10-CM | POA: Insufficient documentation

## 2018-10-15 ENCOUNTER — Ambulatory Visit: Payer: Medicare Other

## 2018-10-16 ENCOUNTER — Ambulatory Visit: Payer: Medicare Other | Admitting: General Surgery

## 2018-10-17 ENCOUNTER — Ambulatory Visit: Payer: Medicare Other

## 2018-10-17 DIAGNOSIS — L2089 Other atopic dermatitis: Secondary | ICD-10-CM | POA: Diagnosis not present

## 2018-10-17 DIAGNOSIS — D179 Benign lipomatous neoplasm, unspecified: Secondary | ICD-10-CM | POA: Diagnosis not present

## 2018-10-22 ENCOUNTER — Ambulatory Visit: Payer: Medicare Other

## 2018-10-22 NOTE — Therapy (Signed)
[facility] [address], [place], [address] Phone: [phone]   Fax:  [phone]  Patient Details  Name: Cody Bridges MRN: [id number] Date of Birth: 29-Jun-1933 Referring Provider:  No ref. provider found  Encounter Date: 10/22/2018   Called patient and informed patient about current clinic closure for a minimum of 2 weeks due to the corona virus outbreak. Once the clinic re-opens, someone should be available to give him a call to schedule more appointments. Pt states that he is doing pretty well. Went to the surgeon for his lipoma which is getting smaller and not bothering him. Surgery to remove it is held off. No questions with his HEP and has also been doing sit <> stands. Not interested in telehealth or online PT if available. Would like to return to the clinic when it re-opens. Pt was recommended to give the clinic a call if he has any questions.       Joneen Boers PT, DPT   10/22/2018, 3:12 PM  [facility] [address], [place], [address] Phone: [phone]   Fax:  [phone]

## 2018-10-23 ENCOUNTER — Other Ambulatory Visit: Payer: Self-pay

## 2018-10-23 ENCOUNTER — Ambulatory Visit (INDEPENDENT_AMBULATORY_CARE_PROVIDER_SITE_OTHER): Payer: Medicare Other | Admitting: Family Medicine

## 2018-10-23 DIAGNOSIS — J449 Chronic obstructive pulmonary disease, unspecified: Secondary | ICD-10-CM | POA: Diagnosis not present

## 2018-10-23 DIAGNOSIS — I10 Essential (primary) hypertension: Secondary | ICD-10-CM

## 2018-10-23 DIAGNOSIS — D179 Benign lipomatous neoplasm, unspecified: Secondary | ICD-10-CM

## 2018-10-23 DIAGNOSIS — E119 Type 2 diabetes mellitus without complications: Secondary | ICD-10-CM

## 2018-10-23 DIAGNOSIS — R0981 Nasal congestion: Secondary | ICD-10-CM | POA: Diagnosis not present

## 2018-10-23 MED ORDER — FLUTICASONE-UMECLIDIN-VILANT 100-62.5-25 MCG/INH IN AEPB: 1.0000 | INHALATION_SPRAY | Freq: Every day | RESPIRATORY_TRACT | 2 refills | Status: DC

## 2018-10-23 MED ORDER — ROSUVASTATIN CALCIUM 20 MG PO TABS: 20.0000 mg | ORAL_TABLET | Freq: Every day | ORAL | 2 refills | Status: DC

## 2018-10-23 MED ORDER — FLUTICASONE PROPIONATE 50 MCG/ACT NA SUSP: 2.0000 | Freq: Every day | NASAL | 2 refills | Status: DC

## 2018-10-23 MED ORDER — HYDROCHLOROTHIAZIDE 25 MG PO TABS: 25.0000 mg | ORAL_TABLET | Freq: Every day | ORAL | 2 refills | Status: DC

## 2018-10-23 MED ORDER — LORATADINE 10 MG PO TABS: 10.0000 mg | ORAL_TABLET | Freq: Every day | ORAL | 2 refills | Status: DC

## 2018-10-23 NOTE — Progress Notes (Signed)
Virtual Visit via Telephone Note  I connected with Cody Bridges on 10/23/18 at  9:30 AM EDT by telephone and verified that I am speaking with the correct person using two identifiers.   I discussed the limitations, risks, security and privacy concerns of performing an evaluation and management service by telephone and the availability of in person appointments. I also discussed with the patient that there may be a patient responsible charge related to this service. The patient expressed understanding and agreed to proceed. I am in the office and the patient is at home.   Telephone encounter was under taken for 3 month follow-up.   The patient Cody Bridges) and I Cody Bridges) were present on the phone call.  History of Present Illness: HYPERTENSION  Disease Monitoring  Home BP Monitoring [phone] systolically Chest pain- no    Dyspnea- chronic and stable related to COPD Medications  Compliance-  Taking HCTZ.   Edema- no  DIABETES Disease Monitoring: Blood Sugar ranges-not checking Polyuria/phagia/dipsia- no      Optho- UTD Medications: Compliance- taking metformin Hypoglycemic symptoms- occasional, unable to tell frequency, he does not check when this occurs, he will eat something and feel better  COPD: Medication compliance- taking trelegy, notes he is out of symbicort though is not sure if he should be using this  Rescue inhaler use- not often Dyspnea- chronic and stable  Wheezing- minimal at night  Cough- no Productive- no  Lipoma: Patient had a lipoma in his left thigh.  He notes he saw general surgery.  His pain has resolved and it has become smaller.  No intervention was planned for this at this time.  The patient was to monitor.  Patient requests refills of Trelegy, Flonase, HCTZ, Crestor, and loratadine.   Observations/Objective: No exam today given remote telehealth visit.  Assessment and Plan: HTN: well controlled per home report. He will continue with current HCTZ  dose. Plan for lab work at visit in 3 months.   DM: last A1c was well controlled. Discussed checking fasting glucose daily.  Patient noted he does have the supplies to do this.  He will continue on metformin. Plan to check A1c in 3 months.   COPD: This issue seems to be stable.  He will continue on Trelegy.  I will send this note to his pulmonologist to confirm that the patient should no longer be on Symbicort.  Lipoma: He has had no pain related to this.  Discussed continuing to monitor and I advised that he should hold off on surgical intervention for this given lack of symptoms.  If he develops symptoms he could consider surgical intervention.  Follow Up Instructions: Patient to follow-up in 3 months.  I will send this note to our staff to get him scheduled for follow-up.   I discussed the assessment and treatment plan with the patient. The patient was provided an opportunity to ask questions and all were answered. The patient agreed with the plan and demonstrated an understanding of the instructions.   The patient was advised to call back or seek an in-person evaluation if the symptoms worsen or if the condition fails to improve as anticipated.  I provided 17 minutes of non-face-to-face time during this encounter.   Cody Rumps, MD

## 2018-10-24 ENCOUNTER — Ambulatory Visit: Payer: Medicare Other

## 2018-10-25 ENCOUNTER — Other Ambulatory Visit: Payer: Self-pay | Admitting: Family Medicine

## 2018-10-25 NOTE — Progress Notes (Signed)
I had thought that he was on trelegy, but if he is on symbicort instead that is fine. He should continue it, thanks.

## 2018-10-27 ENCOUNTER — Telehealth: Payer: Self-pay | Admitting: Family Medicine

## 2018-10-27 NOTE — Telephone Encounter (Signed)
Please let the patient know that he should be taking the Trelegy at this time and not the Symbicort.  Thanks.

## 2018-10-27 NOTE — Telephone Encounter (Signed)
-----   Message from Laverle Hobby, MD sent at 10/25/2018  2:50 PM EDT -----   ----- Message ----- From: Leone Haven, MD Sent: 10/23/2018  10:55 AM EDT To: Laverle Hobby, MD  Hi Dr Ashby Dawes,   I had a phone visit with Mr Quinney today. He noted he was not sure whether or not he was supposed to continue on the symbicort at this time. I wanted to confirm with you whether or not he should still be on this medication. Thank you for your help.   [name]

## 2018-10-29 ENCOUNTER — Ambulatory Visit: Payer: Medicare Other

## 2018-10-29 NOTE — Telephone Encounter (Signed)
Called and spoke with pt. Pt advised and voiced understanding. Pt is Not using the Symbicort.

## 2018-11-01 DIAGNOSIS — L2089 Other atopic dermatitis: Secondary | ICD-10-CM | POA: Diagnosis not present

## 2018-11-01 DIAGNOSIS — L853 Xerosis cutis: Secondary | ICD-10-CM | POA: Diagnosis not present

## 2018-11-14 DIAGNOSIS — L853 Xerosis cutis: Secondary | ICD-10-CM | POA: Diagnosis not present

## 2018-11-14 DIAGNOSIS — L2089 Other atopic dermatitis: Secondary | ICD-10-CM | POA: Diagnosis not present

## 2018-11-14 DIAGNOSIS — L821 Other seborrheic keratosis: Secondary | ICD-10-CM | POA: Diagnosis not present

## 2018-11-14 DIAGNOSIS — D692 Other nonthrombocytopenic purpura: Secondary | ICD-10-CM | POA: Diagnosis not present

## 2018-11-28 DIAGNOSIS — L2089 Other atopic dermatitis: Secondary | ICD-10-CM | POA: Diagnosis not present

## 2018-11-28 DIAGNOSIS — E119 Type 2 diabetes mellitus without complications: Secondary | ICD-10-CM | POA: Diagnosis not present

## 2018-12-07 ENCOUNTER — Ambulatory Visit: Payer: Medicare Other | Admitting: Family Medicine

## 2018-12-11 DIAGNOSIS — L853 Xerosis cutis: Secondary | ICD-10-CM | POA: Diagnosis not present

## 2018-12-11 DIAGNOSIS — L2089 Other atopic dermatitis: Secondary | ICD-10-CM | POA: Diagnosis not present

## 2018-12-11 DIAGNOSIS — L821 Other seborrheic keratosis: Secondary | ICD-10-CM | POA: Diagnosis not present

## 2018-12-18 DIAGNOSIS — E119 Type 2 diabetes mellitus without complications: Secondary | ICD-10-CM | POA: Diagnosis not present

## 2018-12-19 LAB — HM DIABETES EYE EXAM

## 2018-12-25 DIAGNOSIS — L2089 Other atopic dermatitis: Secondary | ICD-10-CM | POA: Diagnosis not present

## 2018-12-25 DIAGNOSIS — D1800 Hemangioma unspecified site: Secondary | ICD-10-CM | POA: Diagnosis not present

## 2018-12-25 DIAGNOSIS — L853 Xerosis cutis: Secondary | ICD-10-CM | POA: Diagnosis not present

## 2018-12-25 DIAGNOSIS — L821 Other seborrheic keratosis: Secondary | ICD-10-CM | POA: Diagnosis not present

## 2018-12-29 ENCOUNTER — Other Ambulatory Visit: Payer: Self-pay | Admitting: Family Medicine

## 2018-12-29 DIAGNOSIS — R0981 Nasal congestion: Secondary | ICD-10-CM

## 2019-01-03 ENCOUNTER — Other Ambulatory Visit: Payer: Self-pay | Admitting: Family Medicine

## 2019-01-07 ENCOUNTER — Telehealth: Payer: Self-pay | Admitting: Family Medicine

## 2019-01-07 NOTE — Telephone Encounter (Signed)
Correction he is requesting a new accu check meter to check his sugar NOT bp- pt said the wrong thing.

## 2019-01-07 NOTE — Telephone Encounter (Signed)
Copied from [facility] [id number]. Topic: Quick Communication - Rx Refill/Question >> Jan 07, 2019 11:07 AM Percell Belt A wrote: Medication:  tamsulosin (FLOMAX) 0.4 MG CAPS capsule [[id number] metFORMIN (GLUCOPHAGE) 500 MG tablet [[id number]] - pt wife is concerned about this med.  She stated that she heard this med could cause cancer and would like for pt to take something else  Pt would also like to see if a new bp machine could be call in?  He would like the Accu Chec  Has the patient contacted their pharmacy? No. (Agent: If no, request that the patient contact the pharmacy for the refill.) (Agent: If yes, when and what did the pharmacy advise?)  Preferred Pharmacy (with phone number or street name): [id number] [address], [place] [phone] (Phone)   Agent: Please be advised that RX refills may take up to 3 business days. We ask that you follow-up with your pharmacy.

## 2019-01-08 ENCOUNTER — Telehealth: Payer: Self-pay

## 2019-01-08 DIAGNOSIS — L570 Actinic keratosis: Secondary | ICD-10-CM | POA: Diagnosis not present

## 2019-01-08 DIAGNOSIS — L578 Other skin changes due to chronic exposure to nonionizing radiation: Secondary | ICD-10-CM | POA: Diagnosis not present

## 2019-01-08 DIAGNOSIS — L2089 Other atopic dermatitis: Secondary | ICD-10-CM | POA: Diagnosis not present

## 2019-01-08 DIAGNOSIS — L812 Freckles: Secondary | ICD-10-CM | POA: Diagnosis not present

## 2019-01-08 DIAGNOSIS — L821 Other seborrheic keratosis: Secondary | ICD-10-CM | POA: Diagnosis not present

## 2019-01-08 DIAGNOSIS — L853 Xerosis cutis: Secondary | ICD-10-CM | POA: Diagnosis not present

## 2019-01-08 DIAGNOSIS — L814 Other melanin hyperpigmentation: Secondary | ICD-10-CM | POA: Diagnosis not present

## 2019-01-08 MED ORDER — BLOOD GLUCOSE MONITOR KIT: PACK | 0 refills | Status: DC

## 2019-01-08 MED ORDER — TAMSULOSIN HCL 0.4 MG PO CAPS: 0.4000 mg | ORAL_CAPSULE | Freq: Every day | ORAL | 1 refills | Status: DC

## 2019-01-08 NOTE — Telephone Encounter (Signed)
They should contact the patient's pharmacy to see if his metformin is part of the recent recall.  If it is I am happy to switch him to something different.  I will refill the Flomax.  Prescription printed for glucometer.  Please fax.

## 2019-01-08 NOTE — Telephone Encounter (Signed)
Called patient and left a message letting him know that the clinic has re-opened. If he would like to continue with in person physical therapy, he can call back a [phone].

## 2019-01-09 NOTE — Telephone Encounter (Signed)
Faxed Rx to pharmacy.  Called and spoke to pt.  Pt is aware.  Also advised pt to contact his pharmacy to see if the metformin is part of a recall.  Pt understands that he needs to call the office back if meds was a part of recall so that Dr. Caryl Bis can call in a different prescription per PCP's note.

## 2019-01-23 DIAGNOSIS — L853 Xerosis cutis: Secondary | ICD-10-CM | POA: Diagnosis not present

## 2019-01-23 DIAGNOSIS — L2089 Other atopic dermatitis: Secondary | ICD-10-CM | POA: Diagnosis not present

## 2019-01-26 ENCOUNTER — Other Ambulatory Visit: Payer: Self-pay | Admitting: Family Medicine

## 2019-02-06 DIAGNOSIS — L2089 Other atopic dermatitis: Secondary | ICD-10-CM | POA: Diagnosis not present

## 2019-02-20 ENCOUNTER — Ambulatory Visit: Payer: Medicare Other | Admitting: Family Medicine

## 2019-02-20 DIAGNOSIS — Z0289 Encounter for other administrative examinations: Secondary | ICD-10-CM

## 2019-03-06 DIAGNOSIS — L209 Atopic dermatitis, unspecified: Secondary | ICD-10-CM | POA: Diagnosis not present

## 2019-03-06 DIAGNOSIS — D1800 Hemangioma unspecified site: Secondary | ICD-10-CM | POA: Diagnosis not present

## 2019-03-06 DIAGNOSIS — L820 Inflamed seborrheic keratosis: Secondary | ICD-10-CM | POA: Diagnosis not present

## 2019-03-06 DIAGNOSIS — D692 Other nonthrombocytopenic purpura: Secondary | ICD-10-CM | POA: Diagnosis not present

## 2019-03-06 DIAGNOSIS — L821 Other seborrheic keratosis: Secondary | ICD-10-CM | POA: Diagnosis not present

## 2019-03-06 DIAGNOSIS — L853 Xerosis cutis: Secondary | ICD-10-CM | POA: Diagnosis not present

## 2019-04-04 DIAGNOSIS — L209 Atopic dermatitis, unspecified: Secondary | ICD-10-CM | POA: Diagnosis not present

## 2019-04-04 DIAGNOSIS — L820 Inflamed seborrheic keratosis: Secondary | ICD-10-CM | POA: Diagnosis not present

## 2019-04-04 DIAGNOSIS — D692 Other nonthrombocytopenic purpura: Secondary | ICD-10-CM | POA: Diagnosis not present

## 2019-04-04 DIAGNOSIS — L821 Other seborrheic keratosis: Secondary | ICD-10-CM | POA: Diagnosis not present

## 2019-04-04 DIAGNOSIS — D1800 Hemangioma unspecified site: Secondary | ICD-10-CM | POA: Diagnosis not present

## 2019-04-18 DIAGNOSIS — L209 Atopic dermatitis, unspecified: Secondary | ICD-10-CM | POA: Diagnosis not present

## 2019-04-22 DIAGNOSIS — I10 Essential (primary) hypertension: Secondary | ICD-10-CM | POA: Diagnosis not present

## 2019-04-22 DIAGNOSIS — I6523 Occlusion and stenosis of bilateral carotid arteries: Secondary | ICD-10-CM | POA: Diagnosis not present

## 2019-04-22 DIAGNOSIS — I70219 Atherosclerosis of native arteries of extremities with intermittent claudication, unspecified extremity: Secondary | ICD-10-CM | POA: Diagnosis not present

## 2019-04-22 DIAGNOSIS — E782 Mixed hyperlipidemia: Secondary | ICD-10-CM | POA: Diagnosis not present

## 2019-05-09 DIAGNOSIS — L209 Atopic dermatitis, unspecified: Secondary | ICD-10-CM | POA: Diagnosis not present

## 2019-05-23 DIAGNOSIS — S50901A Unspecified superficial injury of right elbow, initial encounter: Secondary | ICD-10-CM | POA: Diagnosis not present

## 2019-05-23 DIAGNOSIS — W19XXXA Unspecified fall, initial encounter: Secondary | ICD-10-CM | POA: Diagnosis not present

## 2019-05-23 DIAGNOSIS — D1801 Hemangioma of skin and subcutaneous tissue: Secondary | ICD-10-CM | POA: Diagnosis not present

## 2019-05-23 DIAGNOSIS — R58 Hemorrhage, not elsewhere classified: Secondary | ICD-10-CM | POA: Diagnosis not present

## 2019-05-23 DIAGNOSIS — L821 Other seborrheic keratosis: Secondary | ICD-10-CM | POA: Diagnosis not present

## 2019-05-23 DIAGNOSIS — L209 Atopic dermatitis, unspecified: Secondary | ICD-10-CM | POA: Diagnosis not present

## 2019-06-01 ENCOUNTER — Other Ambulatory Visit: Payer: Self-pay | Admitting: Family Medicine

## 2019-06-06 DIAGNOSIS — L209 Atopic dermatitis, unspecified: Secondary | ICD-10-CM | POA: Diagnosis not present

## 2019-06-20 DIAGNOSIS — D1800 Hemangioma unspecified site: Secondary | ICD-10-CM | POA: Diagnosis not present

## 2019-06-20 DIAGNOSIS — L821 Other seborrheic keratosis: Secondary | ICD-10-CM | POA: Diagnosis not present

## 2019-06-20 DIAGNOSIS — L853 Xerosis cutis: Secondary | ICD-10-CM | POA: Diagnosis not present

## 2019-06-20 DIAGNOSIS — L209 Atopic dermatitis, unspecified: Secondary | ICD-10-CM | POA: Diagnosis not present

## 2019-06-20 DIAGNOSIS — D692 Other nonthrombocytopenic purpura: Secondary | ICD-10-CM | POA: Diagnosis not present

## 2019-07-03 DIAGNOSIS — L209 Atopic dermatitis, unspecified: Secondary | ICD-10-CM | POA: Diagnosis not present

## 2019-07-12 ENCOUNTER — Other Ambulatory Visit: Payer: Self-pay | Admitting: Family Medicine

## 2019-07-17 DIAGNOSIS — D1801 Hemangioma of skin and subcutaneous tissue: Secondary | ICD-10-CM | POA: Diagnosis not present

## 2019-07-17 DIAGNOSIS — L578 Other skin changes due to chronic exposure to nonionizing radiation: Secondary | ICD-10-CM | POA: Diagnosis not present

## 2019-07-17 DIAGNOSIS — L821 Other seborrheic keratosis: Secondary | ICD-10-CM | POA: Diagnosis not present

## 2019-07-17 DIAGNOSIS — L209 Atopic dermatitis, unspecified: Secondary | ICD-10-CM | POA: Diagnosis not present

## 2019-07-20 ENCOUNTER — Other Ambulatory Visit: Payer: Self-pay | Admitting: Family Medicine

## 2019-07-20 DIAGNOSIS — R0981 Nasal congestion: Secondary | ICD-10-CM

## 2019-07-23 ENCOUNTER — Other Ambulatory Visit: Payer: Self-pay | Admitting: Family Medicine

## 2019-07-31 DIAGNOSIS — L209 Atopic dermatitis, unspecified: Secondary | ICD-10-CM | POA: Diagnosis not present

## 2019-08-01 ENCOUNTER — Other Ambulatory Visit: Payer: Self-pay | Admitting: Family Medicine

## 2019-08-01 DIAGNOSIS — R0981 Nasal congestion: Secondary | ICD-10-CM

## 2019-08-14 DIAGNOSIS — L853 Xerosis cutis: Secondary | ICD-10-CM | POA: Diagnosis not present

## 2019-08-14 DIAGNOSIS — L209 Atopic dermatitis, unspecified: Secondary | ICD-10-CM | POA: Diagnosis not present

## 2019-08-14 DIAGNOSIS — H01139 Eczematous dermatitis of unspecified eye, unspecified eyelid: Secondary | ICD-10-CM | POA: Diagnosis not present

## 2019-08-14 DIAGNOSIS — L821 Other seborrheic keratosis: Secondary | ICD-10-CM | POA: Diagnosis not present

## 2019-08-28 DIAGNOSIS — L209 Atopic dermatitis, unspecified: Secondary | ICD-10-CM | POA: Diagnosis not present

## 2019-09-11 DIAGNOSIS — L821 Other seborrheic keratosis: Secondary | ICD-10-CM | POA: Diagnosis not present

## 2019-09-11 DIAGNOSIS — L209 Atopic dermatitis, unspecified: Secondary | ICD-10-CM | POA: Diagnosis not present

## 2019-09-11 DIAGNOSIS — L853 Xerosis cutis: Secondary | ICD-10-CM | POA: Diagnosis not present

## 2019-09-11 DIAGNOSIS — D1801 Hemangioma of skin and subcutaneous tissue: Secondary | ICD-10-CM | POA: Diagnosis not present

## 2019-09-25 DIAGNOSIS — L209 Atopic dermatitis, unspecified: Secondary | ICD-10-CM | POA: Diagnosis not present

## 2019-10-04 ENCOUNTER — Other Ambulatory Visit: Payer: Self-pay | Admitting: Family Medicine

## 2019-10-09 DIAGNOSIS — L209 Atopic dermatitis, unspecified: Secondary | ICD-10-CM | POA: Diagnosis not present

## 2019-10-09 DIAGNOSIS — L853 Xerosis cutis: Secondary | ICD-10-CM | POA: Diagnosis not present

## 2019-10-09 DIAGNOSIS — D692 Other nonthrombocytopenic purpura: Secondary | ICD-10-CM | POA: Diagnosis not present

## 2019-10-13 ENCOUNTER — Other Ambulatory Visit: Payer: Self-pay | Admitting: Family Medicine

## 2019-10-13 DIAGNOSIS — R0981 Nasal congestion: Secondary | ICD-10-CM

## 2019-10-14 ENCOUNTER — Other Ambulatory Visit: Payer: Self-pay | Admitting: Family Medicine

## 2019-10-21 ENCOUNTER — Other Ambulatory Visit: Payer: Self-pay | Admitting: Family Medicine

## 2019-10-21 DIAGNOSIS — R0981 Nasal congestion: Secondary | ICD-10-CM

## 2019-10-22 DIAGNOSIS — I10 Essential (primary) hypertension: Secondary | ICD-10-CM | POA: Diagnosis not present

## 2019-10-22 DIAGNOSIS — E782 Mixed hyperlipidemia: Secondary | ICD-10-CM | POA: Diagnosis not present

## 2019-10-22 DIAGNOSIS — I70219 Atherosclerosis of native arteries of extremities with intermittent claudication, unspecified extremity: Secondary | ICD-10-CM | POA: Diagnosis not present

## 2019-10-22 DIAGNOSIS — R55 Syncope and collapse: Secondary | ICD-10-CM | POA: Diagnosis not present

## 2019-10-22 DIAGNOSIS — I6523 Occlusion and stenosis of bilateral carotid arteries: Secondary | ICD-10-CM | POA: Diagnosis not present

## 2019-10-22 DIAGNOSIS — R0602 Shortness of breath: Secondary | ICD-10-CM | POA: Diagnosis not present

## 2019-10-23 ENCOUNTER — Other Ambulatory Visit: Payer: Self-pay

## 2019-10-23 ENCOUNTER — Ambulatory Visit (INDEPENDENT_AMBULATORY_CARE_PROVIDER_SITE_OTHER): Payer: Medicare Other

## 2019-10-23 DIAGNOSIS — L209 Atopic dermatitis, unspecified: Secondary | ICD-10-CM

## 2019-10-23 MED ORDER — DUPILUMAB 300 MG/2ML ~~LOC~~ SOSY
300.0000 mg | PREFILLED_SYRINGE | SUBCUTANEOUS | Status: DC
  Administered 2019-10-23 – 2022-02-23 (×41): 300 mg via SUBCUTANEOUS

## 2019-10-23 NOTE — Progress Notes (Signed)
Dupixent 300mg  injected into right arm. Patient tolerated it well.

## 2019-10-24 ENCOUNTER — Other Ambulatory Visit: Payer: Self-pay | Admitting: Family Medicine

## 2019-10-24 DIAGNOSIS — R0981 Nasal congestion: Secondary | ICD-10-CM

## 2019-11-06 ENCOUNTER — Other Ambulatory Visit: Payer: Self-pay

## 2019-11-06 ENCOUNTER — Ambulatory Visit (INDEPENDENT_AMBULATORY_CARE_PROVIDER_SITE_OTHER): Payer: Medicare Other | Admitting: Dermatology

## 2019-11-06 DIAGNOSIS — L853 Xerosis cutis: Secondary | ICD-10-CM

## 2019-11-06 DIAGNOSIS — L578 Other skin changes due to chronic exposure to nonionizing radiation: Secondary | ICD-10-CM | POA: Diagnosis not present

## 2019-11-06 DIAGNOSIS — L209 Atopic dermatitis, unspecified: Secondary | ICD-10-CM

## 2019-11-06 DIAGNOSIS — L821 Other seborrheic keratosis: Secondary | ICD-10-CM | POA: Diagnosis not present

## 2019-11-06 NOTE — Progress Notes (Signed)
   Follow-Up Visit   Subjective  Cody Bridges is a 86 y.o. male who presents for the following: Atopic dermatitis (2 weeks f/u  Dupixent injection )  The following portions of the chart were reviewed this encounter and updated as appropriate: Tobacco  Allergies  Meds  Problems  Med Hx  Surg Hx  Fam Hx      Review of Systems: No other skin or systemic complaints.  Objective  Well appearing patient in no apparent distress; mood and affect are within normal limits.  A focused examination was performed including face, exts . Relevant physical exam findings are noted in the Assessment and Plan.  Objective  Left Upper Arm - Anterior: Few minimal areas of pinkness   Assessment & Plan   Seborrheic Keratoses - Stuck-on, waxy, tan-brown papules and plaques  - Discussed benign etiology and prognosis. - Observe - Call for any changes Xerosis - diffuse xerotic patches - recommend gentle, hydrating skin care - gentle skin care handout given  Atopic dermatitis, Severe - better controlled on systemic Dupixent - tolerating well  Dupixent 300 mg injected at L arm, tolerated well  Lot # [id number] Exp 01/2022  Actinic Damage - diffuse scaly erythematous macules with underlying dyspigmentation - Recommend daily broad spectrum sunscreen SPF 30+ to sun-exposed areas, reapply every 2 hours as needed.  - Call for new or changing lesions.   Recommend mild soap and moisturizing cream 1-2 times daily.    dupilumab (DUPIXENT) prefilled syringe 300 mg - Left Upper Arm - Anterior  Return in about 2 weeks (around 11/20/2019) for [place], 1 month with Dr Dara Lords, Marye Round, CMA, am acting as scribe for Sarina Ser, MD .

## 2019-11-07 ENCOUNTER — Encounter: Payer: Self-pay | Admitting: Dermatology

## 2019-11-08 DIAGNOSIS — Z01818 Encounter for other preprocedural examination: Secondary | ICD-10-CM | POA: Diagnosis not present

## 2019-11-08 DIAGNOSIS — R0602 Shortness of breath: Secondary | ICD-10-CM | POA: Diagnosis not present

## 2019-11-20 ENCOUNTER — Ambulatory Visit (INDEPENDENT_AMBULATORY_CARE_PROVIDER_SITE_OTHER): Payer: Medicare Other

## 2019-11-20 ENCOUNTER — Other Ambulatory Visit: Payer: Self-pay

## 2019-11-20 DIAGNOSIS — L209 Atopic dermatitis, unspecified: Secondary | ICD-10-CM | POA: Diagnosis not present

## 2019-11-20 NOTE — Progress Notes (Signed)
Patient here for 2 week Dupixent injection.   Dupixent 300mg /59mL injected into the R upper arm. Patient tolerated well.  [id number] EXP: 01/2022  Patient to see Dr. Nehemiah Massed in 2 weeks for follow up appointment.

## 2019-11-20 NOTE — Patient Instructions (Signed)

## 2019-12-02 DIAGNOSIS — R55 Syncope and collapse: Secondary | ICD-10-CM | POA: Diagnosis not present

## 2019-12-02 DIAGNOSIS — I6523 Occlusion and stenosis of bilateral carotid arteries: Secondary | ICD-10-CM | POA: Diagnosis not present

## 2019-12-04 ENCOUNTER — Ambulatory Visit (INDEPENDENT_AMBULATORY_CARE_PROVIDER_SITE_OTHER): Payer: Medicare Other | Admitting: Dermatology

## 2019-12-04 ENCOUNTER — Other Ambulatory Visit: Payer: Self-pay

## 2019-12-04 DIAGNOSIS — L2089 Other atopic dermatitis: Secondary | ICD-10-CM

## 2019-12-04 DIAGNOSIS — D692 Other nonthrombocytopenic purpura: Secondary | ICD-10-CM | POA: Diagnosis not present

## 2019-12-04 DIAGNOSIS — L853 Xerosis cutis: Secondary | ICD-10-CM

## 2019-12-04 MED ORDER — DUPILUMAB 300 MG/2ML ~~LOC~~ SOSY
300.0000 mg | PREFILLED_SYRINGE | Freq: Once | SUBCUTANEOUS | Status: AC
  Administered 2019-12-04: 300 mg via SUBCUTANEOUS

## 2019-12-04 NOTE — Progress Notes (Signed)
   Follow-Up Visit   Subjective  Cody Bridges is a 86 y.o. male who presents for the following: Eczema (Atopic dermatitis - Dupixent injection today).     The following portions of the chart were reviewed this encounter and updated as appropriate:  Tobacco  Allergies  Meds  Problems  Med Hx  Surg Hx  Fam Hx      Review of Systems:  No other skin or systemic complaints except as noted in HPI or Assessment and Plan.  Objective  Well appearing patient in no apparent distress; mood and affect are within normal limits.  All skin waist up examined.     Purpura - Violaceous macules and patches - Benign - Related to age, sun damage and/or use of blood thinners - Observe - Can use OTC arnica containing moisturizer such as Dermend Bruise Formula if desired - Call for worsening or other concerns    Assessment & Plan  Other atopic dermatitis Chest - Medial (Center)  Severe atopic dermatitis - Well controlled on Dupixent injections.  Xerosis cutis Trunk and extremities  Continue mild soap (DOVE) and moisturizer (CeraVe Cr) daily.  Return in about 4 weeks (around 01/01/2020).    Documentation: I have reviewed the above documentation for accuracy and completeness, and I agree with the above.  Sarina Ser, MD

## 2019-12-07 ENCOUNTER — Encounter: Payer: Self-pay | Admitting: Dermatology

## 2019-12-10 DIAGNOSIS — R0602 Shortness of breath: Secondary | ICD-10-CM | POA: Diagnosis not present

## 2019-12-10 DIAGNOSIS — I10 Essential (primary) hypertension: Secondary | ICD-10-CM | POA: Diagnosis not present

## 2019-12-10 DIAGNOSIS — E782 Mixed hyperlipidemia: Secondary | ICD-10-CM | POA: Diagnosis not present

## 2019-12-10 DIAGNOSIS — E1159 Type 2 diabetes mellitus with other circulatory complications: Secondary | ICD-10-CM | POA: Diagnosis not present

## 2019-12-18 ENCOUNTER — Other Ambulatory Visit: Payer: Self-pay | Admitting: Dermatology

## 2019-12-27 ENCOUNTER — Other Ambulatory Visit: Payer: Self-pay | Admitting: Family Medicine

## 2020-01-01 ENCOUNTER — Ambulatory Visit (INDEPENDENT_AMBULATORY_CARE_PROVIDER_SITE_OTHER): Payer: Medicare Other

## 2020-01-01 ENCOUNTER — Other Ambulatory Visit: Payer: Self-pay

## 2020-01-01 DIAGNOSIS — L209 Atopic dermatitis, unspecified: Secondary | ICD-10-CM

## 2020-01-01 NOTE — Progress Notes (Signed)
Patient here for 2 week Dupixent injection.   Injected into patient's Right arm. Patient tolerated well.  Lot: [id number] EXP: 03/2022

## 2020-01-06 ENCOUNTER — Other Ambulatory Visit: Payer: Self-pay | Admitting: Family Medicine

## 2020-01-09 ENCOUNTER — Other Ambulatory Visit: Payer: Self-pay | Admitting: Family Medicine

## 2020-01-09 DIAGNOSIS — R0981 Nasal congestion: Secondary | ICD-10-CM

## 2020-01-15 ENCOUNTER — Other Ambulatory Visit: Payer: Self-pay

## 2020-01-15 ENCOUNTER — Ambulatory Visit (INDEPENDENT_AMBULATORY_CARE_PROVIDER_SITE_OTHER): Payer: Medicare Other | Admitting: Dermatology

## 2020-01-15 DIAGNOSIS — L2081 Atopic neurodermatitis: Secondary | ICD-10-CM | POA: Diagnosis not present

## 2020-01-15 DIAGNOSIS — L578 Other skin changes due to chronic exposure to nonionizing radiation: Secondary | ICD-10-CM | POA: Diagnosis not present

## 2020-01-15 DIAGNOSIS — L853 Xerosis cutis: Secondary | ICD-10-CM

## 2020-01-15 DIAGNOSIS — L209 Atopic dermatitis, unspecified: Secondary | ICD-10-CM | POA: Diagnosis not present

## 2020-01-15 DIAGNOSIS — L570 Actinic keratosis: Secondary | ICD-10-CM

## 2020-01-15 NOTE — Progress Notes (Signed)
   Follow-Up Visit   Subjective  Cody Bridges is a 86 y.o. male who presents for the following: Dermatitis (Atopic derm severe, 40m f/u Dupixent, no itching, TMC 0.1% lotion prn, Cerave cr).   The following portions of the chart were reviewed this encounter and updated as appropriate:  Tobacco  Allergies  Meds  Problems  Med Hx  Surg Hx  Fam Hx      Review of Systems:  No other skin or systemic complaints except as noted in HPI or Assessment and Plan.  Objective  Well appearing patient in no apparent distress; mood and affect are within normal limits.  A focused examination was performed including face, arms, trunk. Relevant physical exam findings are noted in the Assessment and Plan.  Objective  trunk, extremities: Skin clear today  Objective  L temple x 1: Pink scaly macules    Assessment & Plan    Actinic Damage - diffuse scaly erythematous macules with underlying dyspigmentation - Recommend daily broad spectrum sunscreen SPF 30+ to sun-exposed areas, reapply every 2 hours as needed.  - Call for new or changing lesions.  Xerosis - diffuse xerotic patches - recommend gentle, hydrating skin care - gentle skin care handout given   Atopic neurodermatitis- Severe - but better controlled on Dupixent - systemic medication shot - no side effects. trunk, extremities  Severe, Controlled, improved on Dupixent  Cont Dupixent 300mg /44ml sq injections q 2wks Cont Cerave cream qd  Dupixent 300mg /74ml sq injection to L upper arm, Lot [id number], 03/2022  AK (actinic keratosis) L temple x 1  Destruction of lesion - L temple x 1 Complexity: simple   Destruction method: cryotherapy   Informed consent: discussed and consent obtained   Timeout:  patient name, date of birth, surgical site, and procedure verified Lesion destroyed using liquid nitrogen: Yes   Region frozen until ice ball extended beyond lesion: Yes   Outcome: patient tolerated procedure well with no  complications   Post-procedure details: wound care instructions given    Return in about 2 weeks (around 01/29/2020) for 2wks with nurse, 49m with Dr. Nehemiah Massed.  I, Othelia Pulling, RMA, am acting as scribe for Sarina Ser, MD .  Documentation: I have reviewed the above documentation for accuracy and completeness, and I agree with the above.  Sarina Ser, MD

## 2020-01-19 ENCOUNTER — Other Ambulatory Visit: Payer: Self-pay | Admitting: Family Medicine

## 2020-01-19 ENCOUNTER — Encounter: Payer: Self-pay | Admitting: Dermatology

## 2020-01-21 DIAGNOSIS — H26492 Other secondary cataract, left eye: Secondary | ICD-10-CM | POA: Diagnosis not present

## 2020-01-21 DIAGNOSIS — H35352 Cystoid macular degeneration, left eye: Secondary | ICD-10-CM | POA: Diagnosis not present

## 2020-01-21 LAB — HM DIABETES EYE EXAM

## 2020-01-23 DIAGNOSIS — D2372 Other benign neoplasm of skin of left lower limb, including hip: Secondary | ICD-10-CM | POA: Diagnosis not present

## 2020-01-23 DIAGNOSIS — M79675 Pain in left toe(s): Secondary | ICD-10-CM | POA: Diagnosis not present

## 2020-01-23 DIAGNOSIS — D2371 Other benign neoplasm of skin of right lower limb, including hip: Secondary | ICD-10-CM | POA: Diagnosis not present

## 2020-01-23 DIAGNOSIS — M79674 Pain in right toe(s): Secondary | ICD-10-CM | POA: Diagnosis not present

## 2020-01-23 DIAGNOSIS — B351 Tinea unguium: Secondary | ICD-10-CM | POA: Diagnosis not present

## 2020-01-24 DIAGNOSIS — H26492 Other secondary cataract, left eye: Secondary | ICD-10-CM | POA: Diagnosis not present

## 2020-01-28 ENCOUNTER — Ambulatory Visit (INDEPENDENT_AMBULATORY_CARE_PROVIDER_SITE_OTHER): Payer: Medicare Other

## 2020-01-28 ENCOUNTER — Other Ambulatory Visit: Payer: Self-pay

## 2020-01-28 DIAGNOSIS — L209 Atopic dermatitis, unspecified: Secondary | ICD-10-CM

## 2020-01-28 NOTE — Progress Notes (Signed)
Patient here for 2 week Dupixent injection.  Dupixent 300mg /48mL injected into right upper arm. Patient tolerated well.  LOT: [id number] Exp: 03/2022

## 2020-01-29 ENCOUNTER — Ambulatory Visit: Payer: Medicare Other

## 2020-01-29 DIAGNOSIS — H353221 Exudative age-related macular degeneration, left eye, with active choroidal neovascularization: Secondary | ICD-10-CM | POA: Diagnosis not present

## 2020-01-29 LAB — HM DIABETES EYE EXAM

## 2020-02-12 ENCOUNTER — Other Ambulatory Visit: Payer: Self-pay

## 2020-02-12 ENCOUNTER — Ambulatory Visit (INDEPENDENT_AMBULATORY_CARE_PROVIDER_SITE_OTHER): Payer: Medicare Other | Admitting: Dermatology

## 2020-02-12 DIAGNOSIS — L578 Other skin changes due to chronic exposure to nonionizing radiation: Secondary | ICD-10-CM

## 2020-02-12 DIAGNOSIS — L853 Xerosis cutis: Secondary | ICD-10-CM

## 2020-02-12 DIAGNOSIS — L2081 Atopic neurodermatitis: Secondary | ICD-10-CM | POA: Diagnosis not present

## 2020-02-12 NOTE — Progress Notes (Signed)
   Follow-Up Visit   Subjective  Cody Bridges is a 86 y.o. male who presents for the following: Atopic Dermatitis (severe, 76m f/u Dupixent 300mg /69ml sq injections q 2wks, pt has had issues with seeing so went to his eye doctor and they did some testing and he has wet macular degeneration and they are treating).  The following portions of the chart were reviewed this encounter and updated as appropriate:  Tobacco  Allergies  Meds  Problems  Med Hx  Surg Hx  Fam Hx     Review of Systems:  No other skin or systemic complaints except as noted in HPI or Assessment and Plan.  Objective  Well appearing patient in no apparent distress; mood and affect are within normal limits.  A focused examination was performed including arms, legs, trunk, scalp. Relevant physical exam findings are noted in the Assessment and Plan.  Objective  Right Abdomen (side) - Upper: Xerosis arms, legs   Assessment & Plan    Xerosis - diffuse xerotic patches - recommend gentle, hydrating skin care - gentle skin care handout given  Actinic Damage - diffuse scaly erythematous macules with underlying dyspigmentation - Recommend daily broad spectrum sunscreen SPF 30+ to sun-exposed areas, reapply every 2 hours as needed.  - Call for new or changing lesions.  Atopic neurodermatitis - severe; on Dupixent shots Right Abdomen (side) - Upper  Severe, Controlled,  Improved on Dupixent  No evidence of the Dupixent side effect of Conjunctivitis.   He will have his Ophthalmologist evaluate this.   He does have macular degeneration for which he is getting intra-orbital eye shots which has significantly helped his "wet" macular degeneration.  Cont Dupixent 300mg /49ml sq injections q 2 wks  Dupixent 300mg /29ml sq injections today to L upper arm, Lot [id number] exp 03/2022  Will send note to [facility] Dr. Marchia Meiers  Return in about 2 weeks (around 02/26/2020) for with nurse, 67m with Dr. Nehemiah Massed.  I,  Othelia Pulling, RMA, am acting as scribe for Sarina Ser, MD .  Documentation: I have reviewed the above documentation for accuracy and completeness, and I agree with the above.  Sarina Ser, MD

## 2020-02-13 ENCOUNTER — Telehealth: Payer: Self-pay | Admitting: Family Medicine

## 2020-02-13 DIAGNOSIS — R0981 Nasal congestion: Secondary | ICD-10-CM

## 2020-02-13 MED ORDER — LORATADINE 10 MG PO TABS: 10.0000 mg | ORAL_TABLET | Freq: Every day | ORAL | 0 refills | Status: DC

## 2020-02-13 MED ORDER — HYDROCHLOROTHIAZIDE 25 MG PO TABS: 25.0000 mg | ORAL_TABLET | Freq: Every day | ORAL | 0 refills | Status: DC

## 2020-02-13 NOTE — Telephone Encounter (Signed)
Pt needs a refill on hydrochlorothiazide (HYDRODIURIL) 25 MG tablet and loratadine (CLARITIN) 10 [facility] [address]

## 2020-02-16 ENCOUNTER — Encounter: Payer: Self-pay | Admitting: Dermatology

## 2020-02-26 ENCOUNTER — Other Ambulatory Visit: Payer: Self-pay

## 2020-02-26 ENCOUNTER — Ambulatory Visit (INDEPENDENT_AMBULATORY_CARE_PROVIDER_SITE_OTHER): Payer: Medicare Other

## 2020-02-26 DIAGNOSIS — L209 Atopic dermatitis, unspecified: Secondary | ICD-10-CM | POA: Diagnosis not present

## 2020-02-26 NOTE — Progress Notes (Signed)
Patient here for two week Dupixent injection.   Dupixent 300mg /73mL injected into the right upper arm. Patient tolerated well.  LOT: [id number] EXP: 03/2022

## 2020-02-28 DIAGNOSIS — H353221 Exudative age-related macular degeneration, left eye, with active choroidal neovascularization: Secondary | ICD-10-CM | POA: Diagnosis not present

## 2020-03-11 ENCOUNTER — Ambulatory Visit (INDEPENDENT_AMBULATORY_CARE_PROVIDER_SITE_OTHER): Payer: Medicare Other | Admitting: Dermatology

## 2020-03-11 ENCOUNTER — Other Ambulatory Visit: Payer: Self-pay

## 2020-03-11 DIAGNOSIS — L2081 Atopic neurodermatitis: Secondary | ICD-10-CM

## 2020-03-11 DIAGNOSIS — L853 Xerosis cutis: Secondary | ICD-10-CM | POA: Diagnosis not present

## 2020-03-11 DIAGNOSIS — Z8669 Personal history of other diseases of the nervous system and sense organs: Secondary | ICD-10-CM

## 2020-03-11 MED ORDER — DUPILUMAB 300 MG/2ML ~~LOC~~ SOSY
300.0000 mg | PREFILLED_SYRINGE | Freq: Once | SUBCUTANEOUS | Status: AC
  Administered 2020-03-11: 300 mg via SUBCUTANEOUS

## 2020-03-11 NOTE — Progress Notes (Signed)
   Follow-Up Visit   Subjective  Cody Bridges is a 86 y.o. male who presents for the following: Atopic dermatitis (2wk f/u, Dupixent 300mg /28ml sq injections q 2 wks)  The pt is doing well on systemic Dupixent shots without side effects. He is having intraocular injections to one eye for wet macular degeneration with improvement.  The following portions of the chart were reviewed this encounter and updated as appropriate:  Tobacco  Allergies  Meds  Problems  Med Hx  Surg Hx  Fam Hx      Review of Systems:  No other skin or systemic complaints except as noted in HPI or Assessment and Plan.  Objective  Well appearing patient in no apparent distress; mood and affect are within normal limits.  A focused examination was performed including face, trunk, arms. Relevant physical exam findings are noted in the Assessment and Plan.  Objective  face, trunk, arms, legs: Xerosis trunk, arms   Assessment & Plan    Atopic neurodermatitis severe on systemic Dupixent shots with improvement face, trunk, arms, legs Severe, Controlled, improved on Dupixent   Cont Dupixent 300mg /78ml sq injections q 2wks  Dupixent 300mg /44ml sq injedtion today to L arm, Lot [id number] exp 03/2022  dupilumab (DUPIXENT) prefilled syringe 300 mg - face, trunk, arms, legs  Xerosis - diffuse xerotic patches - recommend gentle, hydrating skin care - gentle skin care handout given  Hx of "Wet" Macular Degeneration - He does have macular degeneration for which he is getting intra-orbital eye shots which has significantly helped his "wet" macular degeneration. Continue care with [facility].  Return in about 2 months (around 05/11/2020) for Atopic derm, patient will f/u with nurse in between 66m f/u with Dr. Nehemiah Massed.   I, Othelia Pulling, RMA, am acting as scribe for Sarina Ser, MD .  Documentation: I have reviewed the above documentation for accuracy and completeness, and I agree with the  above.  Sarina Ser, MD

## 2020-03-16 ENCOUNTER — Encounter: Payer: Self-pay | Admitting: Dermatology

## 2020-03-23 ENCOUNTER — Other Ambulatory Visit: Payer: Self-pay | Admitting: Internal Medicine

## 2020-03-25 ENCOUNTER — Other Ambulatory Visit: Payer: Self-pay

## 2020-03-25 ENCOUNTER — Ambulatory Visit (INDEPENDENT_AMBULATORY_CARE_PROVIDER_SITE_OTHER): Payer: Medicare Other

## 2020-03-25 DIAGNOSIS — L209 Atopic dermatitis, unspecified: Secondary | ICD-10-CM | POA: Diagnosis not present

## 2020-03-25 NOTE — Progress Notes (Signed)
Patient here for two week Dupixent injection.   Dupixent 300mg /10mL injected SQ into right arm. Patient tolerated well.  LOT: [id number] EXP: 06/2022

## 2020-03-27 ENCOUNTER — Other Ambulatory Visit: Payer: Self-pay | Admitting: Family Medicine

## 2020-03-29 ENCOUNTER — Other Ambulatory Visit: Payer: Self-pay | Admitting: Family Medicine

## 2020-04-08 ENCOUNTER — Other Ambulatory Visit: Payer: Self-pay

## 2020-04-08 ENCOUNTER — Ambulatory Visit (INDEPENDENT_AMBULATORY_CARE_PROVIDER_SITE_OTHER): Payer: Medicare Other

## 2020-04-08 DIAGNOSIS — L209 Atopic dermatitis, unspecified: Secondary | ICD-10-CM

## 2020-04-08 NOTE — Progress Notes (Signed)
Pt here for two week Dupixent injection.   Dupixent 300 mg/2 mL injected SQ into left arm. Pt tolerated well.   Lot: [id number] Exp: 06/2022

## 2020-04-13 DIAGNOSIS — H353221 Exudative age-related macular degeneration, left eye, with active choroidal neovascularization: Secondary | ICD-10-CM | POA: Diagnosis not present

## 2020-04-22 ENCOUNTER — Other Ambulatory Visit: Payer: Self-pay

## 2020-04-22 ENCOUNTER — Ambulatory Visit (INDEPENDENT_AMBULATORY_CARE_PROVIDER_SITE_OTHER): Payer: Medicare Other

## 2020-04-22 DIAGNOSIS — L209 Atopic dermatitis, unspecified: Secondary | ICD-10-CM

## 2020-04-22 NOTE — Progress Notes (Signed)
Pt here for two week Dupixent injection.   Dupixent 300 mg/2 mL injected SQ into right arm. Pt tolerated well.   Lot: [id number] Exp: 08/2021

## 2020-04-24 ENCOUNTER — Other Ambulatory Visit: Payer: Self-pay | Admitting: Family Medicine

## 2020-04-24 ENCOUNTER — Other Ambulatory Visit: Payer: Self-pay | Admitting: Internal Medicine

## 2020-05-06 ENCOUNTER — Other Ambulatory Visit: Payer: Self-pay

## 2020-05-06 ENCOUNTER — Ambulatory Visit (INDEPENDENT_AMBULATORY_CARE_PROVIDER_SITE_OTHER): Payer: Medicare Other

## 2020-05-06 DIAGNOSIS — L209 Atopic dermatitis, unspecified: Secondary | ICD-10-CM | POA: Diagnosis not present

## 2020-05-06 NOTE — Progress Notes (Signed)
Patient here for two week Dupixent injection.   Dupixent 300mg /12mL injected SQ into left arm. Patient tolerated well.   LOT: [id number] EXP: 07/2022

## 2020-05-09 ENCOUNTER — Other Ambulatory Visit: Payer: Self-pay | Admitting: Family Medicine

## 2020-05-13 ENCOUNTER — Ambulatory Visit: Payer: Medicare Other | Admitting: Dermatology

## 2020-05-15 DIAGNOSIS — Z23 Encounter for immunization: Secondary | ICD-10-CM | POA: Diagnosis not present

## 2020-05-18 DIAGNOSIS — H353221 Exudative age-related macular degeneration, left eye, with active choroidal neovascularization: Secondary | ICD-10-CM | POA: Diagnosis not present

## 2020-05-18 DIAGNOSIS — H353112 Nonexudative age-related macular degeneration, right eye, intermediate dry stage: Secondary | ICD-10-CM | POA: Diagnosis not present

## 2020-05-20 ENCOUNTER — Ambulatory Visit (INDEPENDENT_AMBULATORY_CARE_PROVIDER_SITE_OTHER): Payer: Medicare Other | Admitting: Dermatology

## 2020-05-20 ENCOUNTER — Other Ambulatory Visit: Payer: Self-pay

## 2020-05-20 ENCOUNTER — Encounter: Payer: Self-pay | Admitting: Dermatology

## 2020-05-20 DIAGNOSIS — H3530 Unspecified macular degeneration: Secondary | ICD-10-CM

## 2020-05-20 DIAGNOSIS — L2081 Atopic neurodermatitis: Secondary | ICD-10-CM

## 2020-05-20 MED ORDER — DUPILUMAB 300 MG/2ML ~~LOC~~ SOSY
300.0000 mg | PREFILLED_SYRINGE | Freq: Once | SUBCUTANEOUS | Status: AC
Start: 2020-05-20 — End: 2020-05-20
  Administered 2020-05-20: 300 mg via SUBCUTANEOUS

## 2020-05-20 NOTE — Progress Notes (Deleted)
   Follow-Up Visit   Subjective  Cody Bridges is a 86 y.o. male who presents for the following: atopic dermatitis (patient is here today for his Dupixent injection and to follow up on his eczema).  The following portions of the chart were reviewed this encounter and updated as appropriate:     Review of Systems:  No other skin or systemic complaints except as noted in HPI or Assessment and Plan.  Objective  Well appearing patient in no apparent distress; mood and affect are within normal limits.  A focused examination was performed including the trunk and extremities. Relevant physical exam findings are noted in the Assessment and Plan.  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  Luther Redo, CMA, am acting as scribe for Sarina Ser, MD .

## 2020-05-20 NOTE — Progress Notes (Signed)
   Follow-Up Visit   Subjective  Cody Bridges is a 86 y.o. male who presents for the following: atopic dermatitis (patient is here today for his Dupixent injection and to follow up on condition).  The following portions of the chart were reviewed this encounter and updated as appropriate:  Tobacco  Allergies  Meds  Problems  Med Hx  Surg Hx  Fam Hx     Review of Systems:  No other skin or systemic complaints except as noted in HPI or Assessment and Plan.  Objective  Well appearing patient in no apparent distress; mood and affect are within normal limits.  A focused examination was performed including face, arms. Relevant physical exam findings are noted in the Assessment and Plan.  Objective  trunk, extremities, face: Xerosis on the face, trunk, and extremities. Pinkness on the arms.   Assessment & Plan  Atopic neurodermatitis trunk, extremities, face Severe, controlled on systemic Dupixent injections -  Dupixent 300mg /53mL injected SQ into the R upper arm post. Patient tolerated injection well. AL, CMA   Lot number: [id number] Expiration date: 07/2022  dupilumab (DUPIXENT) prefilled syringe 300 mg - trunk, extremities, face  Macular degeneration of both eyes, unspecified type Eyes Continue vitamins as prescribed.  Pt also getting shots in eyes.  Return in about 2 weeks (around 06/03/2020) for nurse visit - Dupixent injection; 2 months with Dr. Nehemiah Massed.  Luther Redo, CMA, am acting as scribe for Sarina Ser, MD .  Documentation: I have reviewed the above documentation for accuracy and completeness, and I agree with the above.  Sarina Ser, MD

## 2020-05-31 ENCOUNTER — Other Ambulatory Visit: Payer: Self-pay | Admitting: Internal Medicine

## 2020-05-31 ENCOUNTER — Other Ambulatory Visit: Payer: Self-pay | Admitting: Family Medicine

## 2020-06-03 ENCOUNTER — Other Ambulatory Visit: Payer: Self-pay

## 2020-06-03 ENCOUNTER — Ambulatory Visit (INDEPENDENT_AMBULATORY_CARE_PROVIDER_SITE_OTHER): Payer: Medicare Other

## 2020-06-03 DIAGNOSIS — L209 Atopic dermatitis, unspecified: Secondary | ICD-10-CM | POA: Diagnosis not present

## 2020-06-03 NOTE — Progress Notes (Signed)
Patient here for two week Dupixent injection.  Dupixent 300mg /58mL injected into left upper arm. Patient tolerated well.  LOT: [id number] EXP: 01/28/2022

## 2020-06-09 ENCOUNTER — Other Ambulatory Visit: Payer: Self-pay | Admitting: Dermatology

## 2020-06-17 ENCOUNTER — Other Ambulatory Visit: Payer: Self-pay

## 2020-06-17 ENCOUNTER — Ambulatory Visit (INDEPENDENT_AMBULATORY_CARE_PROVIDER_SITE_OTHER): Payer: Medicare Other

## 2020-06-17 DIAGNOSIS — L209 Atopic dermatitis, unspecified: Secondary | ICD-10-CM | POA: Diagnosis not present

## 2020-06-17 NOTE — Progress Notes (Signed)
Patient here for two week Dupixent injection.   Dupixent 300mg /27mL injected into patients right arm. Patient tolerated well.  LOT: [id number] EXP: 08/2022

## 2020-06-19 DIAGNOSIS — I6523 Occlusion and stenosis of bilateral carotid arteries: Secondary | ICD-10-CM | POA: Diagnosis not present

## 2020-06-19 DIAGNOSIS — E782 Mixed hyperlipidemia: Secondary | ICD-10-CM | POA: Diagnosis not present

## 2020-06-19 DIAGNOSIS — I10 Essential (primary) hypertension: Secondary | ICD-10-CM | POA: Diagnosis not present

## 2020-06-19 DIAGNOSIS — I70219 Atherosclerosis of native arteries of extremities with intermittent claudication, unspecified extremity: Secondary | ICD-10-CM | POA: Diagnosis not present

## 2020-06-23 DIAGNOSIS — B351 Tinea unguium: Secondary | ICD-10-CM | POA: Diagnosis not present

## 2020-06-23 DIAGNOSIS — D2372 Other benign neoplasm of skin of left lower limb, including hip: Secondary | ICD-10-CM | POA: Diagnosis not present

## 2020-06-23 DIAGNOSIS — M79674 Pain in right toe(s): Secondary | ICD-10-CM | POA: Diagnosis not present

## 2020-06-23 DIAGNOSIS — M79675 Pain in left toe(s): Secondary | ICD-10-CM | POA: Diagnosis not present

## 2020-06-23 DIAGNOSIS — D2371 Other benign neoplasm of skin of right lower limb, including hip: Secondary | ICD-10-CM | POA: Diagnosis not present

## 2020-06-30 DIAGNOSIS — H353221 Exudative age-related macular degeneration, left eye, with active choroidal neovascularization: Secondary | ICD-10-CM | POA: Diagnosis not present

## 2020-07-01 ENCOUNTER — Ambulatory Visit (INDEPENDENT_AMBULATORY_CARE_PROVIDER_SITE_OTHER): Payer: Medicare Other

## 2020-07-01 ENCOUNTER — Other Ambulatory Visit: Payer: Self-pay

## 2020-07-01 DIAGNOSIS — L209 Atopic dermatitis, unspecified: Secondary | ICD-10-CM | POA: Diagnosis not present

## 2020-07-01 NOTE — Progress Notes (Signed)
Patient here today for two week Dupixent injection.  Dupixent 300mg /50mL injected SQ into left arm. Patient tolerated well.  LOT: [id number] EXP: 09/2022

## 2020-07-16 ENCOUNTER — Other Ambulatory Visit: Payer: Self-pay

## 2020-07-16 ENCOUNTER — Ambulatory Visit (INDEPENDENT_AMBULATORY_CARE_PROVIDER_SITE_OTHER): Payer: Medicare Other | Admitting: Dermatology

## 2020-07-16 DIAGNOSIS — L209 Atopic dermatitis, unspecified: Secondary | ICD-10-CM

## 2020-07-16 DIAGNOSIS — L853 Xerosis cutis: Secondary | ICD-10-CM | POA: Diagnosis not present

## 2020-07-16 DIAGNOSIS — L2081 Atopic neurodermatitis: Secondary | ICD-10-CM | POA: Diagnosis not present

## 2020-07-16 MED ORDER — DUPILUMAB 300 MG/2ML ~~LOC~~ SOAJ: 300.0000 mg | Freq: Once | SUBCUTANEOUS | Status: DC

## 2020-07-16 NOTE — Progress Notes (Signed)
   Follow-Up Visit   Subjective  Cody Bridges is a 87 y.o. male who presents for the following: Dermatitis (2 weeks f/u Atopic dermatitis, pt here for Dupixent injection ).  The following portions of the chart were reviewed this encounter and updated as appropriate:   Tobacco  Allergies  Meds  Problems  Med Hx  Surg Hx  Fam Hx     Review of Systems:  No other skin or systemic complaints except as noted in HPI or Assessment and Plan.  Objective  Well appearing patient in no apparent distress; mood and affect are within normal limits.  A focused examination was performed including face,exts, trunk . Relevant physical exam findings are noted in the Assessment and Plan.  Objective  trunk, exts, face: Dry skin    Assessment & Plan  Atopic neurodermatitis trunk, exts, face  Atopic dermatitis - Severe, on Dupixent (biologic medication).  Atopic dermatitis (eczema) is a chronic, relapsing, pruritic condition that can significantly affect quality of life. It is often associated with allergic rhinitis and/or asthma and can require treatment with topical medications, phototherapy, or in severe cases a biologic medication called Dupixent.    Severe, controlled on systemic Dupixent injections -   Dupixent 300mg /55mL injected SQ into the R upper arm post. Patient tolerated injection well.    Lot number: [id number]  Lot [id number] Exp 09/2022  Ordered Medications: Dupilumab SOPN 300 mg  Xerosis cutis trunk, exts, face  Apply Cerave moisturizer daily   Return in about 2 weeks (around 07/30/2020) for [place] injection nurse visit .  IMarye Round, CMA, am acting as scribe for Sarina Ser, MD .  Documentation: I have reviewed the above documentation for accuracy and completeness, and I agree with the above.  Sarina Ser, MD

## 2020-07-20 ENCOUNTER — Ambulatory Visit: Payer: Medicare Other | Admitting: Dermatology

## 2020-07-22 ENCOUNTER — Encounter: Payer: Self-pay | Admitting: Dermatology

## 2020-08-04 ENCOUNTER — Ambulatory Visit (INDEPENDENT_AMBULATORY_CARE_PROVIDER_SITE_OTHER): Payer: Medicare Other

## 2020-08-04 ENCOUNTER — Other Ambulatory Visit: Payer: Self-pay

## 2020-08-04 DIAGNOSIS — L209 Atopic dermatitis, unspecified: Secondary | ICD-10-CM | POA: Diagnosis not present

## 2020-08-04 NOTE — Progress Notes (Signed)
Patient here for two week Dupixent injection.   Dupixent 300mg /1mL injected into left upper arm. Patient tolerated well.   LOT: [id number] EXP: 10/31/2022

## 2020-08-05 ENCOUNTER — Other Ambulatory Visit: Payer: Self-pay | Admitting: Family Medicine

## 2020-08-06 ENCOUNTER — Other Ambulatory Visit: Payer: Self-pay | Admitting: Family Medicine

## 2020-08-06 DIAGNOSIS — R0981 Nasal congestion: Secondary | ICD-10-CM

## 2020-08-10 ENCOUNTER — Telehealth: Payer: Self-pay

## 2020-08-10 NOTE — Telephone Encounter (Signed)
Pt's wife called and states that pt wants a referral with urologist because pt keeps having to go to the bathroom. I offered appt with another provider for this week and they stated that they only want to see Dr Caryl Bis. Please advise. Only appts at time of call were same day and HFU

## 2020-08-10 NOTE — Telephone Encounter (Signed)
There is an 11 am visit on Wednesday.

## 2020-08-10 NOTE — Telephone Encounter (Signed)
Pt's wife called and states that pt wants a referral with urologist because pt keeps having to go to the bathroom. I offered appt with another provider for this week and they stated that they only want to see Dr Caryl Bis. Please advise. Only appts at time of call were same day and HFU.  Kaydense Rizo,cma

## 2020-08-10 NOTE — Telephone Encounter (Signed)
I called the patient and scheduled for this wed. Per the provider and I cancelled her appointment that was scheduled later.  Wilborn Membreno,cma

## 2020-08-12 ENCOUNTER — Other Ambulatory Visit: Payer: Self-pay

## 2020-08-12 ENCOUNTER — Other Ambulatory Visit (INDEPENDENT_AMBULATORY_CARE_PROVIDER_SITE_OTHER): Payer: Medicare Other

## 2020-08-12 ENCOUNTER — Ambulatory Visit (INDEPENDENT_AMBULATORY_CARE_PROVIDER_SITE_OTHER): Payer: Medicare Other | Admitting: Family Medicine

## 2020-08-12 ENCOUNTER — Encounter: Payer: Self-pay | Admitting: Family Medicine

## 2020-08-12 VITALS — BP 118/80 | HR 97 | Temp 98.3°F | Ht 70.0 in | Wt 207.2 lb

## 2020-08-12 DIAGNOSIS — J449 Chronic obstructive pulmonary disease, unspecified: Secondary | ICD-10-CM | POA: Diagnosis not present

## 2020-08-12 DIAGNOSIS — E119 Type 2 diabetes mellitus without complications: Secondary | ICD-10-CM | POA: Diagnosis not present

## 2020-08-12 DIAGNOSIS — N401 Enlarged prostate with lower urinary tract symptoms: Secondary | ICD-10-CM | POA: Diagnosis not present

## 2020-08-12 DIAGNOSIS — R350 Frequency of micturition: Secondary | ICD-10-CM | POA: Diagnosis not present

## 2020-08-12 DIAGNOSIS — R8290 Unspecified abnormal findings in urine: Secondary | ICD-10-CM

## 2020-08-12 DIAGNOSIS — Z125 Encounter for screening for malignant neoplasm of prostate: Secondary | ICD-10-CM | POA: Diagnosis not present

## 2020-08-12 DIAGNOSIS — I10 Essential (primary) hypertension: Secondary | ICD-10-CM

## 2020-08-12 LAB — URINALYSIS, ROUTINE W REFLEX MICROSCOPIC
Bilirubin Urine: NEGATIVE
Hgb urine dipstick: NEGATIVE
Ketones, ur: NEGATIVE
Leukocytes,Ua: NEGATIVE
Nitrite: NEGATIVE
RBC / HPF: NONE SEEN (ref 0–?)
Specific Gravity, Urine: 1.015 (ref 1.000–1.030)
Total Protein, Urine: NEGATIVE
Urine Glucose: NEGATIVE
Urobilinogen, UA: 0.2 (ref 0.0–1.0)
pH: 6 (ref 5.0–8.0)

## 2020-08-12 LAB — MICROALBUMIN / CREATININE URINE RATIO
Creatinine,U: 105.4 mg/dL
Microalb Creat Ratio: 3.7 mg/g (ref 0.0–30.0)
Microalb, Ur: 3.9 mg/dL — ABNORMAL HIGH (ref 0.0–1.9)

## 2020-08-12 LAB — POCT URINALYSIS DIPSTICK
Bilirubin, UA: NEGATIVE
Blood, UA: NEGATIVE
Glucose, UA: NEGATIVE
Nitrite, UA: POSITIVE
Protein, UA: POSITIVE — AB
Spec Grav, UA: 1.015 (ref 1.010–1.025)
Urobilinogen, UA: 2 E.U./dL — AB
pH, UA: 6 (ref 5.0–8.0)

## 2020-08-12 MED ORDER — TRELEGY ELLIPTA 100-62.5-25 MCG/INH IN AEPB: 1.0000 | INHALATION_SPRAY | Freq: Every day | RESPIRATORY_TRACT | 2 refills | Status: DC

## 2020-08-12 NOTE — Progress Notes (Addendum)
[name], MD Phone: [phone]  Cody Bridges is a 87 y.o. male who presents today for f/u.  Urine frequency: This has been going on for several months.  No dysuria.  Does have urgency.  No hematuria.  No abdominal pain.  He does have to strain.  He does not feel like he empties his bladder fully.  He does feel his urinary flow is decreased at times.  He is on Flomax 1 tablet daily.  Diabetes: Not checking sugars.  Taking metformin.  No polydipsia.  Occasionally he will feel like his sugar drops NCL feel wiped out.  He drinks a Coke or has some food and this improves.  He follows with ophthalmology for macular degeneration and is on PreserVision and getting injections for that.  Hypertension: Typically 120-140/80.  Taking HCTZ.  No chest pain.  Does have chronic shortness of breath that may have progressed some.  COPD: Has chronic shortness of breath might be slightly worse.  Has been out of his Trelegy.  He uses his albuterol inhaler frequently.  He does follow with pulmonology though has not seen him since April 2021.  He did see cardiology in November and the patient and his wife report that he got a good report from the cardiologist.  Social History   Tobacco Use  Smoking Status Former Smoker  . Packs/day: 2.00  . Years: 35.00  . Pack years: 70.00  . Types: Cigarettes  . Quit date: 10/30/1985  . Years since quitting: 34.8  Smokeless Tobacco Never Used    Current Outpatient Medications on File Prior to Visit  Medication Sig Dispense Refill  . ACCU-CHEK GUIDE test strip USE UP TO 4 TIMES DAILY AS DIRECTED 100 strip 0  . albuterol (VENTOLIN HFA) 108 (90 Base) MCG/ACT inhaler INHALE 1-2 PUFFS BY MOUTH EVERY 6 HOURS AS NEEDED FOR WHEEZE OR SHORTNESS OF BREATH 18 each 2  . amLODipine (NORVASC) 5 MG tablet Take 5 mg by mouth daily.    . blood glucose meter kit and supplies KIT Dispense based on patient and insurance preference. Use up to four times daily as directed. (FOR  ICD-10  E11.9). 1 each 0  . budesonide-formoterol (SYMBICORT) 160-4.5 MCG/ACT inhaler Inhale 2 puffs into the lungs 2 (two) times daily as needed (for shortness of breath).    . calcium carbonate (TUMS - DOSED IN MG ELEMENTAL CALCIUM) 500 MG chewable tablet Chew 1-2 tablets by mouth 3 (three) times daily as needed (for acid reflux/indigestion.).    Marland Kitchen Cholecalciferol (VITAMIN D-3) 5000 units TABS Take 5,000 Units by mouth daily.     . DUPIXENT 300 MG/2ML prefilled syringe INJECT 1 SYRINGE UNDER THE SKIN EVERY OTHER WEEK 4 mL 4  . EUCRISA 2 % OINT Apply 1 application topically daily as needed.  2  . fluticasone (FLONASE) 50 MCG/ACT nasal spray SPRAY 2 SPRAYS INTO EACH NOSTRIL EVERY DAY 16 mL 1  . hydrochlorothiazide (HYDRODIURIL) 25 MG tablet TAKE 1 TABLET BY MOUTH EVERY DAY 90 tablet 0  . loratadine (CLARITIN) 10 MG tablet TAKE 1 TABLET BY MOUTH EVERY DAY 90 tablet 1  . metFORMIN (GLUCOPHAGE) 500 MG tablet TAKE 1 TABLET BY MOUTH TWICE A DAY 180 tablet 5  . Multiple Vitamin (MULTIVITAMIN WITH MINERALS) TABS tablet Take 1 tablet by mouth daily.    . Multiple Vitamins-Minerals (PRESERVISION AREDS PO) Take by mouth.    Marland Kitchen omeprazole (PRILOSEC) 20 MG capsule Take 1 capsule (20 mg total) by mouth daily. 30 capsule 0  .  Polyethylene Glycol 400 (BLINK TEARS) 0.25 % SOLN Apply to eye.    . rosuvastatin (CRESTOR) 20 MG tablet TAKE 1 TABLET BY MOUTH EVERY DAY 90 tablet 0  . tamsulosin (FLOMAX) 0.4 MG CAPS capsule TAKE 1 CAPSULE BY MOUTH EVERY DAY 90 capsule 0  . triamcinolone lotion (KENALOG) 0.1 % Apply 1 application topically 2 (two) times daily as needed. 120 mL 0   Current Facility-Administered Medications on File Prior to Visit  Medication Dose Route Frequency Provider Last Rate Last Admin  . dupilumab (DUPIXENT) prefilled syringe 300 mg  300 mg Subcutaneous Q14 Days Ralene Bathe, MD   300 mg at 08/04/20 1114  . Dupilumab SOPN 300 mg  300 mg Subcutaneous Once Ralene Bathe, MD         ROS  see history of present illness  Objective  Physical Exam Vitals:   08/12/20 1105  BP: 118/80  Pulse: 97  Temp: 98.3 F (36.8 C)  SpO2: 98%    BP Readings from Last 3 Encounters:  08/12/20 118/80  10/11/18 (!) 151/76  08/29/18 120/62   Wt Readings from Last 3 Encounters:  08/12/20 207 lb 3.2 oz (94 kg)  10/11/18 214 lb 12.8 oz (97.4 kg)  08/29/18 209 lb 9.6 oz (95.1 kg)    Physical Exam Constitutional:      General: He is not in acute distress.    Appearance: He is not diaphoretic.  Cardiovascular:     Rate and Rhythm: Normal rate and regular rhythm.     Heart sounds: Normal heart sounds.  Pulmonary:     Effort: Pulmonary effort is normal.     Breath sounds: Normal breath sounds.  Genitourinary:    Comments: Prostate is enlarged, but there is no tenderness or bogginess Musculoskeletal:        General: No edema.  Skin:    General: Skin is warm and dry.  Neurological:     Mental Status: He is alert.      Assessment/Plan: Please see individual problem list.  Problem List Items Addressed This Visit    Benign prostatic hyperplasia with urinary frequency    Symptoms most consistent with BPH.  Prostate is enlarged.  We will check lab work and a urine test.  He will continue Flomax 1 tablet daily.  Consider medication changes after labs return.      Relevant Orders   POCT Urinalysis Dipstick (Completed)   PSA, Medicare ( [place] only)   COPD (chronic obstructive pulmonary disease) ([place])    Possible slight worsening of chronic dyspnea.  He has been out of his Trelegy and thus I will refill this.  They will contact his pulmonologist for follow-up.  If he has acute worsening of symptoms he will be evaluated immediately.      Relevant Medications   Fluticasone-Umeclidin-Vilant (TRELEGY ELLIPTA) 100-62.5-25 MCG/INH AEPB   Other Relevant Orders   CBC   Diabetes mellitus without complication ([place])    Undetermined control.  He will continue metformin 500 mg  twice daily.  Check A1c.      Relevant Orders   Urine Microalbumin w/creat. ratio   Comp Met (CMET)   HgB A1c   Lipid panel   Hypertension    Adequately controlled.  He will continue HCTZ 25 mg once daily.  Check labs.      Relevant Medications   amLODipine (NORVASC) 5 MG tablet   Other Relevant Orders   Comp Met (CMET)    Other Visit Diagnoses  Prostate cancer screening    -  Primary   Relevant Orders   PSA, Medicare ( [place] [facility] only)   Abnormal urinalysis       Relevant Orders   Urine Culture   Urine Microscopic        This visit occurred during the SARS-CoV-2 public health emergency.  Safety protocols were in place, including screening questions prior to the visit, additional usage of staff PPE, and extensive cleaning of exam room while observing appropriate contact time as indicated for disinfecting solutions.    [name], MD [place]

## 2020-08-12 NOTE — Addendum Note (Signed)
Addended by: Caryl Bis ERIC G on: 08/12/2020 02:55 PM   Modules accepted: Orders

## 2020-08-12 NOTE — Assessment & Plan Note (Signed)
Possible slight worsening of chronic dyspnea.  He has been out of his Trelegy and thus I will refill this.  They will contact his pulmonologist for follow-up.  If he has acute worsening of symptoms he will be evaluated immediately.

## 2020-08-12 NOTE — Assessment & Plan Note (Signed)
Undetermined control.  He will continue metformin 500 mg twice daily.  Check A1c.

## 2020-08-12 NOTE — Assessment & Plan Note (Signed)
Symptoms most consistent with BPH.  Prostate is enlarged.  We will check lab work and a urine test.  He will continue Flomax 1 tablet daily.  Consider medication changes after labs return.

## 2020-08-12 NOTE — Assessment & Plan Note (Signed)
Adequately controlled.  He will continue HCTZ 25 mg once daily.  Check labs.

## 2020-08-12 NOTE — Patient Instructions (Signed)
Nice to see you. We will get lab work and urine studies today. Please restart the Trelegy. Please contact your pulmonologist for follow-up.

## 2020-08-13 LAB — URINE CULTURE
MICRO NUMBER:: 11409983
Result:: NO GROWTH
SPECIMEN QUALITY:: ADEQUATE

## 2020-08-25 ENCOUNTER — Ambulatory Visit: Payer: Medicare Other | Admitting: Family Medicine

## 2020-08-25 DIAGNOSIS — H353221 Exudative age-related macular degeneration, left eye, with active choroidal neovascularization: Secondary | ICD-10-CM | POA: Diagnosis not present

## 2020-08-25 LAB — HM DIABETES EYE EXAM

## 2020-08-26 ENCOUNTER — Other Ambulatory Visit: Payer: Self-pay

## 2020-08-26 ENCOUNTER — Other Ambulatory Visit (INDEPENDENT_AMBULATORY_CARE_PROVIDER_SITE_OTHER): Payer: Medicare Other

## 2020-08-26 DIAGNOSIS — I10 Essential (primary) hypertension: Secondary | ICD-10-CM

## 2020-08-26 DIAGNOSIS — J449 Chronic obstructive pulmonary disease, unspecified: Secondary | ICD-10-CM | POA: Diagnosis not present

## 2020-08-26 DIAGNOSIS — N401 Enlarged prostate with lower urinary tract symptoms: Secondary | ICD-10-CM

## 2020-08-26 DIAGNOSIS — E119 Type 2 diabetes mellitus without complications: Secondary | ICD-10-CM | POA: Diagnosis not present

## 2020-08-26 DIAGNOSIS — Z125 Encounter for screening for malignant neoplasm of prostate: Secondary | ICD-10-CM | POA: Diagnosis not present

## 2020-08-26 DIAGNOSIS — R350 Frequency of micturition: Secondary | ICD-10-CM

## 2020-08-26 LAB — CBC
HCT: 39.9 % (ref 39.0–52.0)
Hemoglobin: 13 g/dL (ref 13.0–17.0)
MCHC: 32.6 g/dL (ref 30.0–36.0)
MCV: 96.1 fl (ref 78.0–100.0)
Platelets: 379 10*3/uL (ref 150.0–400.0)
RBC: 4.16 Mil/uL — ABNORMAL LOW (ref 4.22–5.81)
RDW: 13 % (ref 11.5–15.5)
WBC: 9.9 10*3/uL (ref 4.0–10.5)

## 2020-08-26 LAB — LIPID PANEL
Cholesterol: 93 mg/dL (ref 0–200)
HDL: 36.3 mg/dL — ABNORMAL LOW (ref >39.00)
LDL Cholesterol: 30 mg/dL (ref 0–99)
NonHDL: 56.75
Total CHOL/HDL Ratio: 3
Triglycerides: 133 mg/dL (ref 0.0–149.0)
VLDL: 26.6 mg/dL (ref 0.0–40.0)

## 2020-08-26 LAB — COMPREHENSIVE METABOLIC PANEL
ALT: 18 U/L (ref 0–53)
AST: 15 U/L (ref 0–37)
Albumin: 4 g/dL (ref 3.5–5.2)
Alkaline Phosphatase: 74 U/L (ref 39–117)
BUN: 22 mg/dL (ref 6–23)
CO2: 34 mEq/L — ABNORMAL HIGH (ref 19–32)
Calcium: 9.6 mg/dL (ref 8.4–10.5)
Chloride: 97 mEq/L (ref 96–112)
Creatinine, Ser: 1.03 mg/dL (ref 0.40–1.50)
GFR: 66.25 mL/min (ref >60.00)
Glucose, Bld: 120 mg/dL — ABNORMAL HIGH (ref 70–99)
Potassium: 3.9 mEq/L (ref 3.5–5.1)
Sodium: 138 mEq/L (ref 135–145)
Total Bilirubin: 0.5 mg/dL (ref 0.2–1.2)
Total Protein: 7.2 g/dL (ref 6.0–8.3)

## 2020-08-26 LAB — HEMOGLOBIN A1C: Hgb A1c MFr Bld: 6.8 % — ABNORMAL HIGH (ref 4.6–6.5)

## 2020-08-26 LAB — PSA, MEDICARE: PSA: 3.64 ng/ml (ref 0.10–4.00)

## 2020-08-27 ENCOUNTER — Ambulatory Visit (INDEPENDENT_AMBULATORY_CARE_PROVIDER_SITE_OTHER): Payer: Medicare Other

## 2020-08-27 ENCOUNTER — Other Ambulatory Visit: Payer: Self-pay | Admitting: Family Medicine

## 2020-08-27 DIAGNOSIS — L209 Atopic dermatitis, unspecified: Secondary | ICD-10-CM | POA: Diagnosis not present

## 2020-08-27 MED ORDER — FINASTERIDE 5 MG PO TABS: 5.0000 mg | ORAL_TABLET | Freq: Every day | ORAL | 1 refills | Status: DC

## 2020-08-27 NOTE — Progress Notes (Signed)
Patient here for two week Dupixent injection.  Dupixent 300mg /42mL injected into patients right arm. Patient tolerated well.   LOT: [id number] Exp: 10/2022

## 2020-08-31 DIAGNOSIS — I517 Cardiomegaly: Secondary | ICD-10-CM | POA: Diagnosis not present

## 2020-08-31 DIAGNOSIS — R911 Solitary pulmonary nodule: Secondary | ICD-10-CM | POA: Diagnosis not present

## 2020-08-31 DIAGNOSIS — R918 Other nonspecific abnormal finding of lung field: Secondary | ICD-10-CM | POA: Diagnosis not present

## 2020-08-31 DIAGNOSIS — J449 Chronic obstructive pulmonary disease, unspecified: Secondary | ICD-10-CM | POA: Diagnosis not present

## 2020-08-31 DIAGNOSIS — J310 Chronic rhinitis: Secondary | ICD-10-CM | POA: Diagnosis not present

## 2020-08-31 DIAGNOSIS — R0600 Dyspnea, unspecified: Secondary | ICD-10-CM | POA: Diagnosis not present

## 2020-09-08 ENCOUNTER — Other Ambulatory Visit: Payer: Self-pay | Admitting: Internal Medicine

## 2020-09-10 ENCOUNTER — Ambulatory Visit (INDEPENDENT_AMBULATORY_CARE_PROVIDER_SITE_OTHER): Payer: Medicare Other | Admitting: Dermatology

## 2020-09-10 ENCOUNTER — Other Ambulatory Visit: Payer: Self-pay

## 2020-09-10 DIAGNOSIS — L853 Xerosis cutis: Secondary | ICD-10-CM | POA: Diagnosis not present

## 2020-09-10 DIAGNOSIS — L578 Other skin changes due to chronic exposure to nonionizing radiation: Secondary | ICD-10-CM | POA: Diagnosis not present

## 2020-09-10 DIAGNOSIS — L2081 Atopic neurodermatitis: Secondary | ICD-10-CM | POA: Diagnosis not present

## 2020-09-10 DIAGNOSIS — L570 Actinic keratosis: Secondary | ICD-10-CM

## 2020-09-10 MED ORDER — DUPILUMAB 300 MG/2ML ~~LOC~~ SOSY
300.0000 mg | PREFILLED_SYRINGE | Freq: Once | SUBCUTANEOUS | Status: AC
  Administered 2020-09-10: 300 mg via SUBCUTANEOUS

## 2020-09-10 NOTE — Patient Instructions (Signed)

## 2020-09-10 NOTE — Progress Notes (Signed)
   Follow-Up Visit   Subjective  Cody Bridges is a 87 y.o. male who presents for the following: Follow-up (Atopic Dermatitis follow up - Dupixent injections q 2 weeks).  His itching is controlled.  He has a spot on his ear.  The following portions of the chart were reviewed this encounter and updated as appropriate:   Tobacco  Allergies  Meds  Problems  Med Hx  Surg Hx  Fam Hx     Review of Systems:  No other skin or systemic complaints except as noted in HPI or Assessment and Plan.  Objective  Well appearing patient in no apparent distress; mood and affect are within normal limits.  A focused examination was performed including face, trunk, arms. Relevant physical exam findings are noted in the Assessment and Plan.  Objective  Face, trunk, extremities: Pink patch of left arm  Objective  Trunk, extremities: Xerosis  Objective  Left Ear: Erythematous thin papules/macules with gritty scale.    Assessment & Plan  Atopic neurodermatitis Face, trunk, extremities  Atopic dermatitis - Severe, on Dupixent (biologic medication).  Atopic dermatitis (eczema) is a chronic, relapsing, pruritic condition that can significantly affect quality of life. It is often associated with allergic rhinitis and/or asthma and can require treatment with topical medications, phototherapy, or in severe cases a biologic medication called Dupixent.     Severe, controlled on systemic Dupixent injections -   Dupixent 300mg /51mL injected SQ into the left upper arm post. Patient tolerated injection well.   Lot [id number] Exp 11/2022    dupilumab (DUPIXENT) prefilled syringe 300 mg - Face, trunk, extremities  Xerosis cutis Trunk, extremities  Continue moisturizer daily.  AK (actinic keratosis) Left Ear  Destruction of lesion - Left Ear Complexity: simple   Destruction method: cryotherapy   Informed consent: discussed and consent obtained   Timeout:  patient name, date of birth, surgical  site, and procedure verified Lesion destroyed using liquid nitrogen: Yes   Region frozen until ice ball extended beyond lesion: Yes   Outcome: patient tolerated procedure well with no complications   Post-procedure details: wound care instructions given    Actinic Damage - chronic, secondary to cumulative UV radiation exposure/sun exposure over time - diffuse scaly erythematous macules with underlying dyspigmentation - Recommend daily broad spectrum sunscreen SPF 30+ to sun-exposed areas, reapply every 2 hours as needed.  - Call for new or changing lesions.  Return in about 2 weeks (around 09/24/2020) for Injection on Nurse schedule.   I, Ashok Cordia, CMA, am acting as scribe for Sarina Ser, MD .  Documentation: I have reviewed the above documentation for accuracy and completeness, and I agree with the above.  Sarina Ser, MD

## 2020-09-13 ENCOUNTER — Encounter: Payer: Self-pay | Admitting: Dermatology

## 2020-09-24 ENCOUNTER — Ambulatory Visit (INDEPENDENT_AMBULATORY_CARE_PROVIDER_SITE_OTHER): Payer: Medicare Other

## 2020-09-24 ENCOUNTER — Other Ambulatory Visit: Payer: Self-pay

## 2020-09-24 DIAGNOSIS — L209 Atopic dermatitis, unspecified: Secondary | ICD-10-CM | POA: Diagnosis not present

## 2020-09-24 NOTE — Progress Notes (Signed)
Patient here for two week Dupixent injection.   Dupixent 300mg /70mL injected into right arm. Patient tolerated well.   LOT: [id number] EXP: 11/2022

## 2020-10-08 ENCOUNTER — Ambulatory Visit (INDEPENDENT_AMBULATORY_CARE_PROVIDER_SITE_OTHER): Payer: Medicare Other | Admitting: Dermatology

## 2020-10-08 ENCOUNTER — Other Ambulatory Visit: Payer: Self-pay

## 2020-10-08 DIAGNOSIS — L2081 Atopic neurodermatitis: Secondary | ICD-10-CM | POA: Diagnosis not present

## 2020-10-08 DIAGNOSIS — D692 Other nonthrombocytopenic purpura: Secondary | ICD-10-CM

## 2020-10-08 DIAGNOSIS — D1800 Hemangioma unspecified site: Secondary | ICD-10-CM | POA: Diagnosis not present

## 2020-10-08 DIAGNOSIS — L821 Other seborrheic keratosis: Secondary | ICD-10-CM

## 2020-10-08 DIAGNOSIS — L578 Other skin changes due to chronic exposure to nonionizing radiation: Secondary | ICD-10-CM | POA: Diagnosis not present

## 2020-10-08 MED ORDER — DUPILUMAB 300 MG/2ML ~~LOC~~ SOSY
300.0000 mg | PREFILLED_SYRINGE | Freq: Once | SUBCUTANEOUS | Status: AC
  Administered 2020-10-08: 300 mg via SUBCUTANEOUS

## 2020-10-08 NOTE — Progress Notes (Signed)
   Follow-Up Visit   Subjective  Cody Bridges is a 87 y.o. male who presents for the following: atopic neurodermatitis (Patient is here today for his Dupixent 300mg /9mL SQ injection. His itching has been controlled on treatment. ).  The following portions of the chart were reviewed this encounter and updated as appropriate:   Tobacco  Allergies  Meds  Problems  Med Hx  Surg Hx  Fam Hx     Review of Systems:  No other skin or systemic complaints except as noted in HPI or Assessment and Plan.  Objective  Well appearing patient in no apparent distress; mood and affect are within normal limits.  A focused examination was performed including the trunk and extremities. Relevant physical exam findings are noted in the Assessment and Plan.  Objective  Trunk, extremities:   Assessment & Plan  Atopic neurodermatitis Trunk, extremities Atopic dermatitis - Severe, chronic, and persistent, but controlled on Dupixent (biologic medication).  Atopic dermatitis (eczema) is a chronic, relapsing, pruritic condition that can significantly affect quality of life. It is often associated with allergic rhinitis and/or asthma and can require treatment with topical medications, phototherapy, or in severe cases a biologic medication called Dupixent.   Continue Dupixent 300mg /30mL SQ QOW. Dupixent 300mg /76mL injected SQ into the L upper arm post. Patient tolerated injection well. AL, CMA  dupilumab (DUPIXENT) prefilled syringe 300 mg - Trunk, extremities  Purpura - Chronic; persistent and recurrent.  Treatable, but not curable. - Violaceous macules and patches - Benign - Related to trauma, age, sun damage and/or use of blood thinners, chronic use of topical and/or oral steroids - Observe - Can use OTC arnica containing moisturizer such as Dermend Bruise Formula if desired - Call for worsening or other concerns  Actinic Damage - chronic, secondary to cumulative UV radiation exposure/sun exposure  over time - diffuse scaly erythematous macules with underlying dyspigmentation - Recommend daily broad spectrum sunscreen SPF 30+ to sun-exposed areas, reapply every 2 hours as needed.  - Recommend staying in the shade or wearing long sleeves, sun glasses (UVA+UVB protection) and wide brim hats (4-inch brim around the entire circumference of the hat). - Call for new or changing lesions.  Seborrheic Keratoses - Stuck-on, waxy, tan-brown papules and plaques  - Discussed benign etiology and prognosis. - Observe - Call for any changes  Hemangiomas - Red papules - Discussed benign nature - Observe - Call for any changes  Return in about 2 weeks (around 10/22/2020) for nurse visit - Dupixent injection; 2 mths with Dr. Nehemiah Massed.  Luther Redo, CMA, am acting as scribe for Sarina Ser, MD .  Documentation: I have reviewed the above documentation for accuracy and completeness, and I agree with the above.  Sarina Ser, MD

## 2020-10-12 IMAGING — DX DG FEMUR 2+V*L*
4 series · 4 of 4 positions shown · non-contrast
Comparison: None.

CLINICAL DATA: Patient reports a mass lesion in the mid aspect of
the left upper leg.

EXAM:
LEFT FEMUR 2 VIEWS

[upper leg with knee ap]
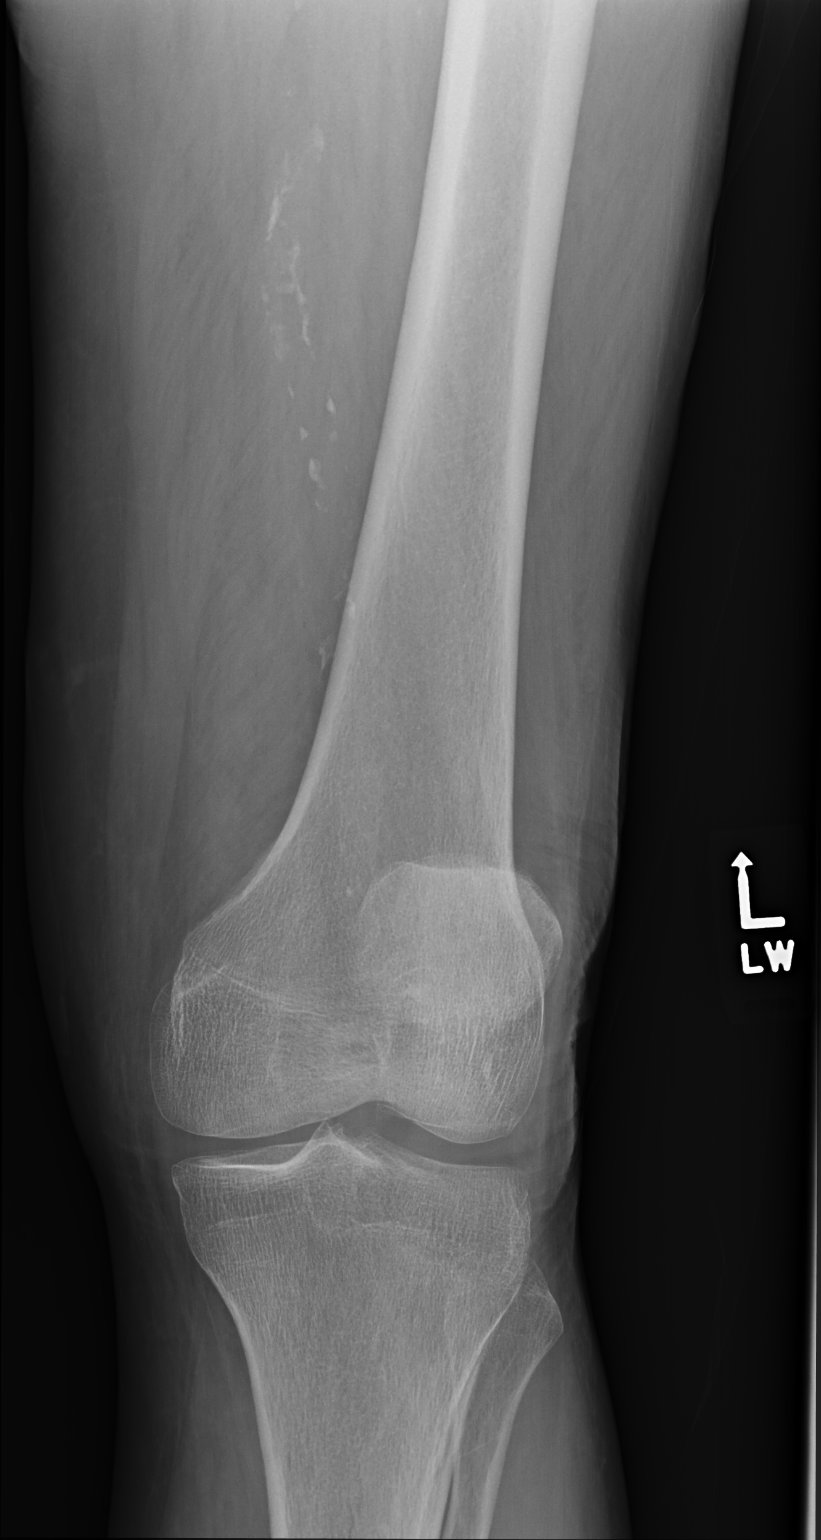

[upper leg with knee lat]
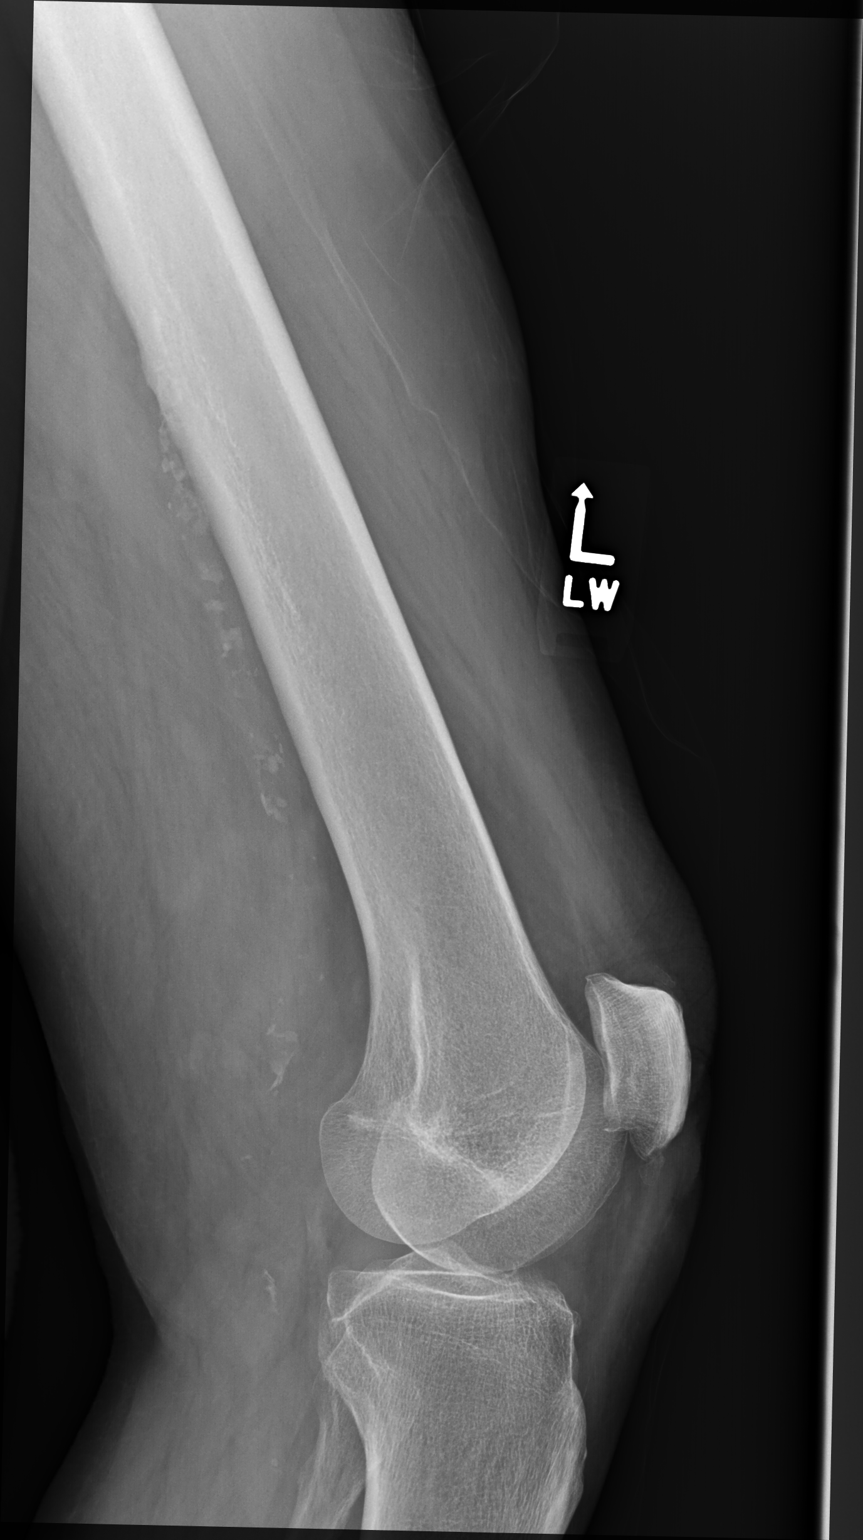

[upper leg with hip ap]
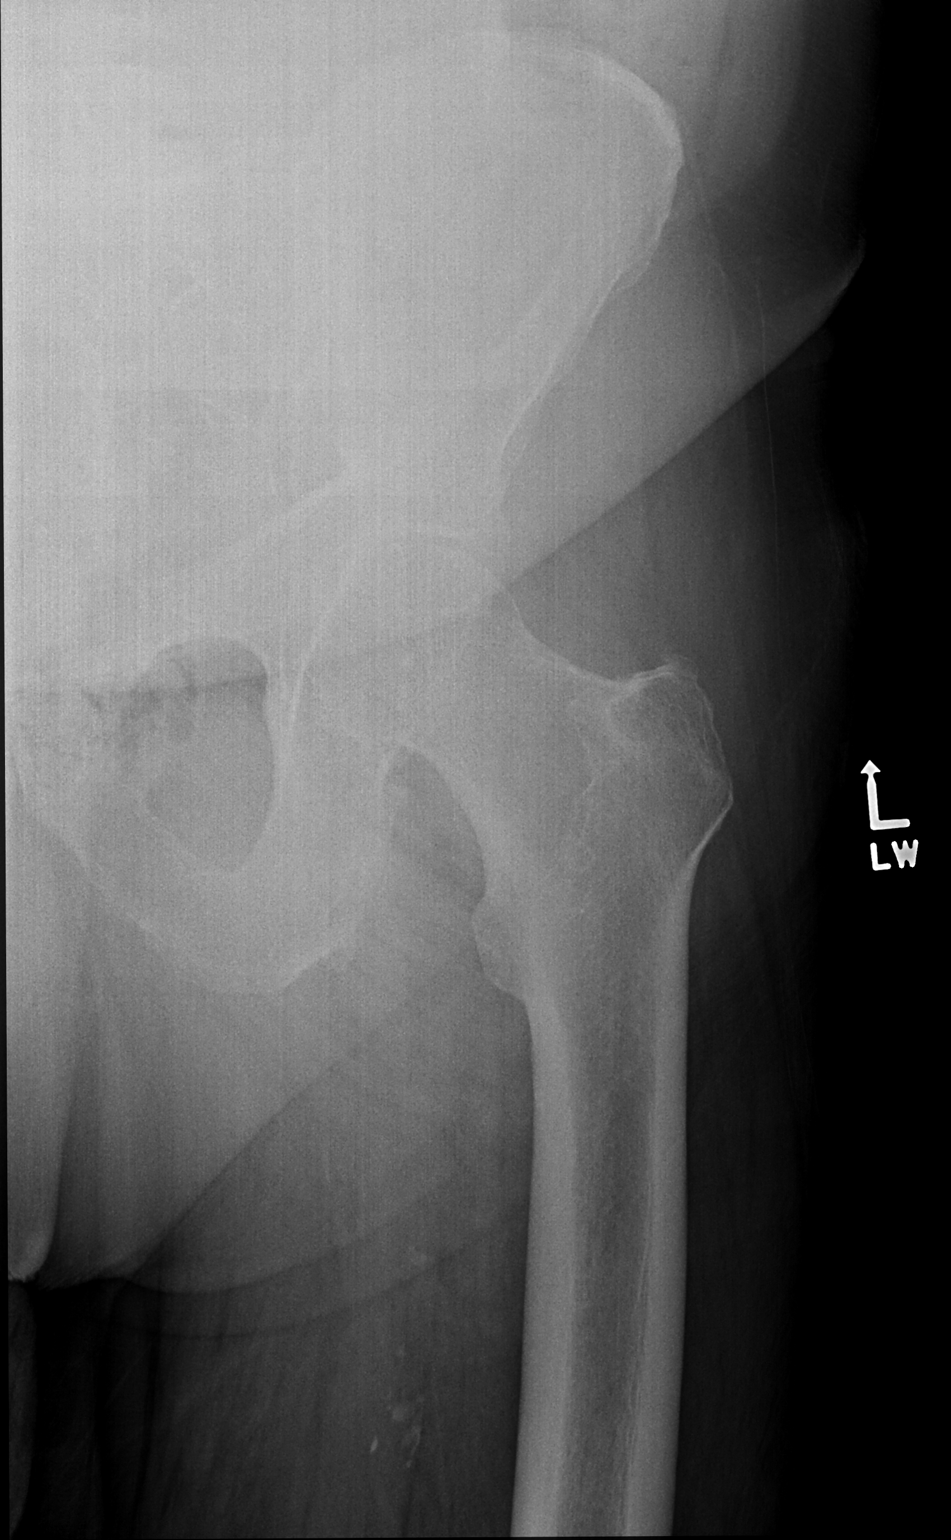

[upper leg with hip lat]
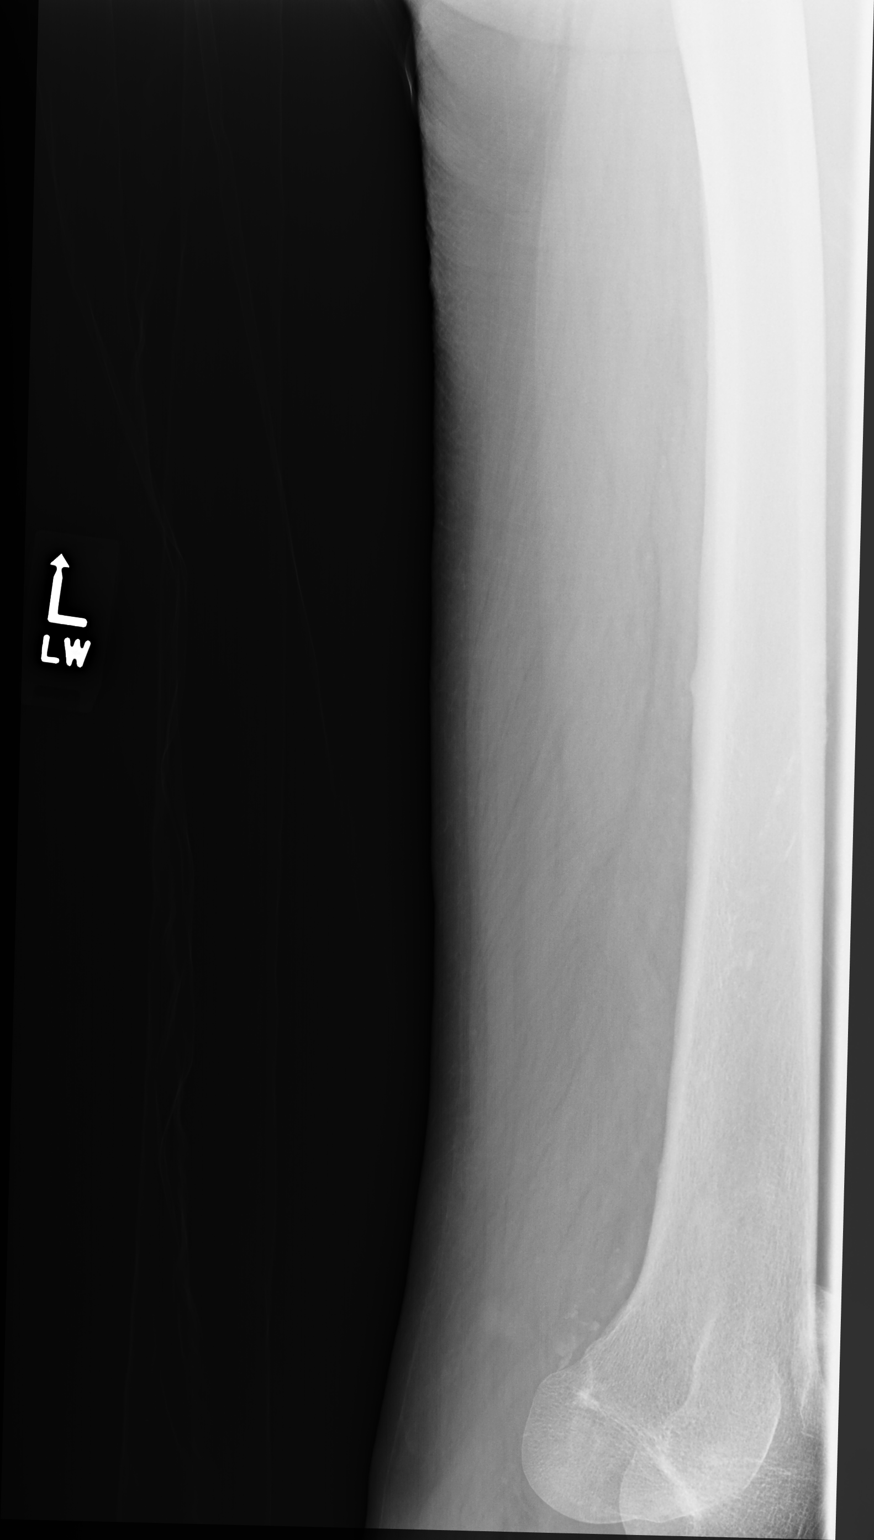

[4 of 4 positions shown; findings below may reference images not displayed]

FINDINGS: There is no evidence of fracture or other focal bone lesions.
Atherosclerosis is noted. Soft tissues are otherwise unremarkable.
No mass is seen.
IMPRESSION: Negative for mass by plain films.  No acute finding.

Atherosclerosis.

## 2020-10-21 ENCOUNTER — Encounter: Payer: Self-pay | Admitting: Dermatology

## 2020-10-22 ENCOUNTER — Ambulatory Visit: Payer: Medicare Other

## 2020-10-22 ENCOUNTER — Ambulatory Visit (INDEPENDENT_AMBULATORY_CARE_PROVIDER_SITE_OTHER): Payer: Medicare Other

## 2020-10-22 ENCOUNTER — Other Ambulatory Visit: Payer: Self-pay

## 2020-10-22 DIAGNOSIS — L209 Atopic dermatitis, unspecified: Secondary | ICD-10-CM

## 2020-10-22 NOTE — Progress Notes (Signed)
Patient here for two week Dupixent injection.   Dupixent 300mg /33mL injected SQ into right arm. Patient tolerated well.   LOT: [id number] EXP: 12/2022

## 2020-11-05 ENCOUNTER — Other Ambulatory Visit: Payer: Self-pay

## 2020-11-05 ENCOUNTER — Ambulatory Visit (INDEPENDENT_AMBULATORY_CARE_PROVIDER_SITE_OTHER): Payer: Medicare Other

## 2020-11-05 DIAGNOSIS — L209 Atopic dermatitis, unspecified: Secondary | ICD-10-CM

## 2020-11-05 NOTE — Progress Notes (Signed)
Patient here for two week Dupixent injection.   Dupixent 300mg /64mL injected SQ into left arm. Patient tolerated well.   LOT: [id number] EXP: 01/2023

## 2020-11-10 ENCOUNTER — Encounter: Payer: Self-pay | Admitting: Family Medicine

## 2020-11-10 ENCOUNTER — Other Ambulatory Visit: Payer: Self-pay

## 2020-11-10 ENCOUNTER — Ambulatory Visit (INDEPENDENT_AMBULATORY_CARE_PROVIDER_SITE_OTHER): Payer: Medicare Other | Admitting: Family Medicine

## 2020-11-10 DIAGNOSIS — I10 Essential (primary) hypertension: Secondary | ICD-10-CM

## 2020-11-10 DIAGNOSIS — Z8669 Personal history of other diseases of the nervous system and sense organs: Secondary | ICD-10-CM | POA: Diagnosis not present

## 2020-11-10 DIAGNOSIS — N401 Enlarged prostate with lower urinary tract symptoms: Secondary | ICD-10-CM

## 2020-11-10 DIAGNOSIS — R350 Frequency of micturition: Secondary | ICD-10-CM

## 2020-11-10 DIAGNOSIS — J449 Chronic obstructive pulmonary disease, unspecified: Secondary | ICD-10-CM

## 2020-11-10 DIAGNOSIS — E119 Type 2 diabetes mellitus without complications: Secondary | ICD-10-CM | POA: Diagnosis not present

## 2020-11-10 LAB — BASIC METABOLIC PANEL
BUN: 22 mg/dL (ref 6–23)
CO2: 33 mEq/L — ABNORMAL HIGH (ref 19–32)
Calcium: 9.6 mg/dL (ref 8.4–10.5)
Chloride: 96 mEq/L (ref 96–112)
Creatinine, Ser: 1.06 mg/dL (ref 0.40–1.50)
GFR: 63.91 mL/min (ref >60.00)
Glucose, Bld: 133 mg/dL — ABNORMAL HIGH (ref 70–99)
Potassium: 3.7 mEq/L (ref 3.5–5.1)
Sodium: 138 mEq/L (ref 135–145)

## 2020-11-10 LAB — POCT URINALYSIS DIPSTICK
Bilirubin, UA: NEGATIVE
Blood, UA: NEGATIVE
Glucose, UA: NEGATIVE
Leukocytes, UA: NEGATIVE
Nitrite, UA: NEGATIVE
Protein, UA: NEGATIVE
Spec Grav, UA: 1.02 (ref 1.010–1.025)
Urobilinogen, UA: 0.2 E.U./dL
pH, UA: 5 (ref 5.0–8.0)

## 2020-11-10 NOTE — Assessment & Plan Note (Signed)
Chronic dyspnea.  He will continue on Trelegy.  He will follow up with his pulmonologist.  He will monitor for any significant worsening.

## 2020-11-10 NOTE — Patient Instructions (Signed)
Nice to see you. We will check your urine and check your blood glucose today. Please see your eye doctor for follow-up. I am referring you to urology for evaluation of your urinary frequency and BPH.

## 2020-11-10 NOTE — Assessment & Plan Note (Signed)
Symptoms seem to have worsened.  He will continue Flomax and Proscar.  Will refer to urology.  We will check a urinalysis.

## 2020-11-10 NOTE — Assessment & Plan Note (Signed)
Improved on recheck.  He will continue amlodipine 5 mg once daily.

## 2020-11-10 NOTE — Assessment & Plan Note (Signed)
Check A1c.  Certainly poorly controlled blood glucose could be contributing to his urinary frequency.  He will continue Metformin 500 mg twice daily.

## 2020-11-10 NOTE — Assessment & Plan Note (Signed)
The patient's describes symptoms seem consistent with a migraine aura or an ocular migraine.  He is neurologically intact.  He will see his eye doctor to have his eyes checked in the near future.  He will let us know if he has any recurrence or if he develops any neurological symptoms he will be evaluated immediately.

## 2020-11-10 NOTE — Progress Notes (Signed)
[name], MD Phone: [phone]  Cody Bridges is a 87 y.o. male who presents today for f/u.  HYPERTENSION  Disease Monitoring  Home BP Monitoring not checking Chest pain- no    Dyspnea- chronic related to COPD Medications  Compliance-  Taking amlodipine.   COPD: Patient notes his breathing has progressively gotten worse over the years.  He is currently using Trelegy.  Notes at times it is better than others.  No wheezing or cough.  He does follow with pulmonology.  DIABETES Disease Monitoring: Blood Sugar ranges-not checking Polyuria/phagia/dipsia-some polyuria though no polydipsia Optho- UTD Medications: Compliance- taking metformin Hypoglycemic symptoms- no  BPH: Patient notes increased urinary frequency that has worsened.  The flow is sometimes weak.  He has some urgency.  No dysuria.  He occasionally has to strain.  Nocturia 2-3 times per night.  He does not feel like he empties his bladder.  He is currently on Flomax and Proscar.  Migraine history: Patient reports a history of migraines.  Notes its been several years since he has had one.  Last night he had issues where he saw different colors in his vision for a couple of hours.  There were no flashing lights.  They checked his blood pressure though he does not remember the blood pressure.  He notes it felt like the start of a migraine but the headache did not come.  No numbness or weakness.  No vision changes.  He does have macular degeneration and notes he cannot see centrally in the left eye.  He gets injections for that and the last injection was 6 weeks ago.  Social History   Tobacco Use  Smoking Status Former Smoker  . Packs/day: 2.00  . Years: 35.00  . Pack years: 70.00  . Types: Cigarettes  . Quit date: 10/30/1985  . Years since quitting: 35.0  Smokeless Tobacco Never Used    Current Outpatient Medications on File Prior to Visit  Medication Sig Dispense Refill  . ACCU-CHEK GUIDE test strip USE UP TO 4  TIMES DAILY AS DIRECTED 100 strip 0  . albuterol (VENTOLIN HFA) 108 (90 Base) MCG/ACT inhaler INHALE 1-2 PUFFS BY MOUTH EVERY 6 HOURS AS NEEDED FOR WHEEZE OR SHORTNESS OF BREATH 18 each 2  . amLODipine (NORVASC) 5 MG tablet Take 5 mg by mouth daily.    . blood glucose meter kit and supplies KIT Dispense based on patient and insurance preference. Use up to four times daily as directed. (FOR ICD-10  E11.9). 1 each 0  . budesonide-formoterol (SYMBICORT) 160-4.5 MCG/ACT inhaler Inhale 2 puffs into the lungs 2 (two) times daily as needed (for shortness of breath).    . calcium carbonate (TUMS - DOSED IN MG ELEMENTAL CALCIUM) 500 MG chewable tablet Chew 1-2 tablets by mouth 3 (three) times daily as needed (for acid reflux/indigestion.).    Marland Kitchen Cholecalciferol (VITAMIN D-3) 5000 units TABS Take 5,000 Units by mouth daily.     . DUPIXENT 300 MG/2ML prefilled syringe INJECT 1 SYRINGE UNDER THE SKIN EVERY OTHER WEEK 4 mL 4  . EUCRISA 2 % OINT Apply 1 application topically daily as needed.  2  . finasteride (PROSCAR) 5 MG tablet Take 1 tablet (5 mg total) by mouth daily. 90 tablet 1  . fluticasone (FLONASE) 50 MCG/ACT nasal spray SPRAY 2 SPRAYS INTO EACH NOSTRIL EVERY DAY 16 mL 1  . Fluticasone-Umeclidin-Vilant (TRELEGY ELLIPTA) 100-62.5-25 MCG/INH AEPB Take 1 puff by mouth daily. 120 each 2  . hydrochlorothiazide (HYDRODIURIL) 25 MG  tablet TAKE 1 TABLET BY MOUTH EVERY DAY 90 tablet 0  . loratadine (CLARITIN) 10 MG tablet TAKE 1 TABLET BY MOUTH EVERY DAY 90 tablet 1  . metFORMIN (GLUCOPHAGE) 500 MG tablet TAKE 1 TABLET BY MOUTH TWICE A DAY 180 tablet 5  . Multiple Vitamin (MULTIVITAMIN WITH MINERALS) TABS tablet Take 1 tablet by mouth daily.    . Multiple Vitamins-Minerals (PRESERVISION AREDS PO) Take by mouth.    Marland Kitchen omeprazole (PRILOSEC) 20 MG capsule Take 1 capsule (20 mg total) by mouth daily. 30 capsule 0  . Polyethylene Glycol 400 (BLINK TEARS) 0.25 % SOLN Apply to eye.    . rosuvastatin (CRESTOR) 20 MG  tablet TAKE 1 TABLET BY MOUTH EVERY DAY 90 tablet 0  . tamsulosin (FLOMAX) 0.4 MG CAPS capsule TAKE 1 CAPSULE BY MOUTH EVERY DAY 90 capsule 0  . triamcinolone lotion (KENALOG) 0.1 % Apply 1 application topically 2 (two) times daily as needed. 120 mL 0   Current Facility-Administered Medications on File Prior to Visit  Medication Dose Route Frequency Provider Last Rate Last Admin  . dupilumab (DUPIXENT) prefilled syringe 300 mg  300 mg Subcutaneous Q14 Days Ralene Bathe, MD   300 mg at 11/05/20 1102     ROS see history of present illness  Objective  Physical Exam Vitals:   11/10/20 0940 11/10/20 0957  BP: (!) 160/80 140/70  Pulse: 96   Temp: 98.7 F (37.1 C)   SpO2: 95%     BP Readings from Last 3 Encounters:  11/10/20 140/70  08/12/20 118/80  10/11/18 (!) 151/76   Wt Readings from Last 3 Encounters:  11/10/20 202 lb 9.6 oz (91.9 kg)  08/12/20 207 lb 3.2 oz (94 kg)  10/11/18 214 lb 12.8 oz (97.4 kg)    Physical Exam Constitutional:      General: He is not in acute distress.    Appearance: He is not diaphoretic.  Cardiovascular:     Rate and Rhythm: Normal rate and regular rhythm.     Heart sounds: Normal heart sounds.  Pulmonary:     Effort: Pulmonary effort is normal.     Breath sounds: Normal breath sounds.  Skin:    General: Skin is warm and dry.  Neurological:     Mental Status: He is alert.     Comments: EOMI, PERRL, V1 through V3 intact bilaterally to light touch, hearing intact to finger rub, shoulder shrug intact, opens and closes eyes adequately, 5/5 strength in bilateral biceps, triceps, grip, quads, hamstrings, plantar and dorsiflexion, sensation to light touch intact in bilateral UE and LE      Assessment/Plan: Please see individual problem list.  Problem List Items Addressed This Visit    Diabetes mellitus without complication (HCC)    Check A1c.  Certainly poorly controlled blood glucose could be contributing to his urinary frequency.  He  will continue Metformin 500 mg twice daily.      Relevant Orders   Basic Metabolic Panel (BMET)   COPD (chronic obstructive pulmonary disease) ([place])    Chronic dyspnea.  He will continue on Trelegy.  He will follow up with his pulmonologist.  He will monitor for any significant worsening.      Hypertension    Improved on recheck.  He will continue amlodipine 5 mg once daily.      Relevant Orders   Basic Metabolic Panel (BMET)   Benign prostatic hyperplasia with urinary frequency    Symptoms seem to have worsened.  He will continue  Flomax and Proscar.  Will refer to urology.  We will check a urinalysis.      Relevant Orders   POCT Urinalysis Dipstick   Ambulatory referral to Urology   History of migraine    The patient's describes symptoms seem consistent with a migraine aura or an ocular migraine.  He is neurologically intact.  He will see his eye doctor to have his eyes checked in the near future.  He will let us know if he has any recurrence or if he develops any neurological symptoms he will be evaluated immediately.         This visit occurred during the SARS-CoV-2 public health emergency.  Safety protocols were in place, including screening questions prior to the visit, additional usage of staff PPE, and extensive cleaning of exam room while observing appropriate contact time as indicated for disinfecting solutions.    [name], MD [place]

## 2020-11-17 DIAGNOSIS — H353221 Exudative age-related macular degeneration, left eye, with active choroidal neovascularization: Secondary | ICD-10-CM | POA: Diagnosis not present

## 2020-11-17 LAB — HM DIABETES EYE EXAM

## 2020-11-19 ENCOUNTER — Ambulatory Visit (INDEPENDENT_AMBULATORY_CARE_PROVIDER_SITE_OTHER): Payer: Medicare Other

## 2020-11-19 ENCOUNTER — Other Ambulatory Visit: Payer: Self-pay

## 2020-11-19 DIAGNOSIS — L209 Atopic dermatitis, unspecified: Secondary | ICD-10-CM

## 2020-11-19 NOTE — Progress Notes (Signed)
Patient here for two week Dupixent injection.   Dupixent 300mg /48mL injected SQ into right arm. Patient tolerated well.   LOT: [id number] EXP: 01/2023

## 2020-11-23 ENCOUNTER — Other Ambulatory Visit: Payer: Self-pay | Admitting: Family Medicine

## 2020-11-23 ENCOUNTER — Other Ambulatory Visit: Payer: Self-pay | Admitting: Internal Medicine

## 2020-11-25 ENCOUNTER — Other Ambulatory Visit: Payer: Self-pay

## 2020-11-25 ENCOUNTER — Ambulatory Visit (INDEPENDENT_AMBULATORY_CARE_PROVIDER_SITE_OTHER): Payer: Medicare Other | Admitting: Urology

## 2020-11-25 ENCOUNTER — Encounter: Payer: Self-pay | Admitting: Urology

## 2020-11-25 VITALS — BP 131/74 | HR 98 | Ht 70.0 in | Wt 203.0 lb

## 2020-11-25 DIAGNOSIS — N401 Enlarged prostate with lower urinary tract symptoms: Secondary | ICD-10-CM

## 2020-11-25 DIAGNOSIS — R350 Frequency of micturition: Secondary | ICD-10-CM | POA: Diagnosis not present

## 2020-11-25 DIAGNOSIS — N3281 Overactive bladder: Secondary | ICD-10-CM

## 2020-11-25 LAB — BLADDER SCAN AMB NON-IMAGING

## 2020-11-25 NOTE — Progress Notes (Signed)
11/25/20 12:10 PM   Cody Bridges 07/22/1933 [id number]  CC: Urinary urgency, frequency, urge incontinence  HPI: I saw Cody Bridges in urology clinic today for the above issues.  He is a very comorbid and frail-appearing 87 year old male whose primary complaint is a few years of urinary urgency, frequency, and urge incontinence.  He has nocturia 4 times overnight.  He has been on Flomax long-term, and finasteride was recently started by his PCP.  PSA was recently normal at 3.6 in January 2022, which was stable from prior in 2019, and he had not been on finasteride at that time so it does not need to be adjusted.  He drinks coffee in the morning and 2 sodas during the day.  He takes a soda with him to bed.  A CT was performed in March 2018 for abdominal pain that showed a 40 g prostate, and subtle herniation of a small part of the bladder dome into a right inguinal hernia.  This was never treated surgically.  Urinalysis today with 6-10 WBCs, 0-2 RBCs, no bacteria, nitrite negative, trace leukocytes.  Sent for culture and atypicals.  PMH: Past Medical History:  Diagnosis Date  . Arthritis   . Asthma   . COPD (chronic obstructive pulmonary disease) ([place])   . Diabetes mellitus without complication ([place])     5027  . Dyspnea    with exertion  . Edema   . GERD (gastroesophageal reflux disease)   . HOH (hard of hearing)    AIDS  . Hyperlipidemia   . Hypertension   . Wheezing     Surgical History: Past Surgical History:  Procedure Laterality Date  . BUNIONECTOMY Right   . CATARACT EXTRACTION W/PHACO Left 08/22/2017   Procedure: CATARACT EXTRACTION PHACO AND INTRAOCULAR LENS PLACEMENT (IOC);  Surgeon: Birder Robson, MD;  Location: [facility] ORS;  Service: Ophthalmology;  Laterality: Left;  [place] 00:43.7 AP% 12.3 CDE 5.38 Fluid pack lot # [id number]  . COLONOSCOPY  2016  . ELECTROMAGNETIC NAVIGATION BROCHOSCOPY N/A 02/09/2017   Procedure: ELECTROMAGNETIC NAVIGATION BRONCHOSCOPY;  Surgeon:  Flora Lipps, MD;  Location: [facility] ORS;  Service: Cardiopulmonary;  Laterality: N/A;  . EYE SURGERY Right    Cataract Extraction with IOL  . HAMMER TOE SURGERY Right   . TONSILLECTOMY  1938    Family History: Family History  Problem Relation Age of Onset  . Cancer Mother   . Stomach cancer Mother   . Stomach cancer Sister     Social History:  reports that he quit smoking about 35 years ago. His smoking use included cigarettes. He has a 70.00 pack-year smoking history. He has never used smokeless tobacco. He reports current alcohol use. He reports that he does not use drugs.  Physical Exam: BP 131/74   Pulse 98   Ht 5\' 10"  (1.778 m)   Wt 203 lb (92.1 kg)   BMI 29.13 kg/m    Constitutional:  Alert and oriented, No acute distress.  Frail-appearing Cardiovascular: No clubbing, cyanosis, or edema. Respiratory: Normal respiratory effort, no increased work of breathing. GI: Abdomen is soft, nontender, nondistended, no abdominal masses GU: Phallus with hypospadiac meatus at the base of the glans, patent.  Testicles 20 cc and descended bilaterally, no obvious hernias, no masses or other lesions.  Laboratory Data: Reviewed, see HPI  Pertinent Imaging: I have personally viewed and interpreted the CT from March 2018 showing a 40 g prostate and subtle herniation of a portion of the bladder dome into the right inguinal hernia.Marland Kitchen  Assessment & Plan:   87 year old comorbid and frail-appearing male with primarily overactive symptoms of urgency, frequency, and urge incontinence.  He has been on Flomax long-term.  I think he has more of an overactive bladder picture, which is likely exacerbated by his high soda intake and coffee during the day.  Behavioral strategies were discussed extensively.  We discussed that overactive bladder (OAB) is not a disease, but is a symptom complex that is generally not life-threatening.  Symptoms typically include urinary urgency, frequency, and urge incontinence.   There are numerous treatment options, however there are risks and benefits with both medical and surgical management.  First-line treatment is behavioral therapies including bladder training, pelvic floor muscle training, and fluid management.  Second line treatments include oral antimuscarinics(Ditropan er, Trospium) and beta-3 agonist (Mybetriq). There is typically a period of medication trial (4-8 weeks) to find the optimal therapy and dosing. If symptoms are bothersome despite the above management, third line options include intra-detrusor botox, peripheral tibial nerve stimulation (PTNS), and interstim (SNS). These are more invasive treatments with higher side effect profile, but may improve quality of life for patients with severe OAB symptoms.   Behavioral strategies discussed extensively Trial of Myrbetriq 50 mg daily samples given RTC 4 to 6 weeks symptom check and PVR, consider cystoscopy if no improvement with above strategies  Nickolas Madrid, MD 11/25/2020  [facility] [address] [place], [place] [address] [phone]

## 2020-11-25 NOTE — Patient Instructions (Signed)
Overactive Bladder, Adult  Overactive bladder is a condition in which a person has a sudden and frequent need to urinate. A person might also leak urine if he or she cannot get to the bathroom fast enough (urinary incontinence). Sometimes, symptoms can interfere with work or social activities. What are the causes? Overactive bladder is associated with poor nerve signals between your bladder and your brain. Your bladder may get the signal to empty before it is full. You may also have very sensitive muscles that make your bladder squeeze too soon. This condition may also be caused by other factors, such as:  Medical conditions: ? Urinary tract infection. ? Infection of nearby tissues. ? Prostate enlargement. ? Bladder stones, inflammation, or tumors. ? Diabetes. ? Muscle or nerve weakness, especially from these conditions:  A spinal cord injury.  Stroke.  Multiple sclerosis.  Parkinson's disease.  Other causes: ? Surgery on the uterus or urethra. ? Drinking too much caffeine or alcohol. ? Certain medicines, especially those that eliminate extra fluid in the body (diuretics). ? Constipation. What increases the risk? You may be at greater risk for overactive bladder if you:  Are an older adult.  Smoke.  Are going through menopause.  Have prostate problems.  Have a neurological disease, such as stroke, dementia, Parkinson's disease, or multiple sclerosis (MS).  Eat or drink alcohol, spicy food, caffeine, and other things that irritate the bladder.  Are overweight or obese. What are the signs or symptoms? Symptoms of this condition include a sudden, strong urge to urinate. Other symptoms include:  Leaking urine.  Urinating 8 or more times a day.  Waking up to urinate 2 or more times overnight. How is this diagnosed? This condition may be diagnosed based on:  Your symptoms and medical history.  A physical exam.  Blood or urine tests to check for possible causes,  such as infection. You may also need to see a health care provider who specializes in urinary tract problems. This is called a urologist. How is this treated? Treatment for overactive bladder depends on the cause of your condition and whether it is mild or severe. Treatment may include:  Bladder training, such as: ? Learning to control the urge to urinate by following a schedule to urinate at regular intervals. ? Doing Kegel exercises to strengthen the pelvic floor muscles that support your bladder.  Special devices, such as: ? Biofeedback. This uses sensors to help you become aware of your body's signals. ? Electrical stimulation. This uses electrodes placed inside the body (implanted) or outside the body. These electrodes send gentle pulses of electricity to strengthen the nerves or muscles that control the bladder. ? Women may use a plastic device, called a pessary, that fits into the vagina and supports the bladder.  Medicines, such as: ? Antibiotics to treat bladder infection. ? Antispasmodics to stop the bladder from releasing urine at the wrong time. ? Tricyclic antidepressants to relax bladder muscles. ? Injections of botulinum toxin type A directly into the bladder tissue to relax bladder muscles.  Surgery, such as: ? A device may be implanted to help manage the nerve signals that control urination. ? An electrode may be implanted to stimulate electrical signals in the bladder. ? A procedure may be done to change the shape of the bladder. This is done only in very severe cases. Follow these instructions at home: Eating and drinking  Make diet or lifestyle changes recommended by your health care provider. These may include: ? Drinking fluids   throughout the day and not only with meals. ? Cutting down on caffeine or alcohol. ? Eating a healthy and balanced diet to prevent constipation. This may include:  Choosing foods that are high in fiber, such as beans, whole grains, and  fresh fruits and vegetables.  Limiting foods that are high in fat and processed sugars, such as fried and sweet foods.   Lifestyle  Lose weight if needed.  Do not use any products that contain nicotine or tobacco. These include cigarettes, chewing tobacco, and vaping devices, such as e-cigarettes. If you need help quitting, ask your health care provider.   General instructions  Take over-the-counter and prescription medicines only as told by your health care provider.  If you were prescribed an antibiotic medicine, take it as told by your health care provider. Do not stop taking the antibiotic even if you start to feel better.  Use any implants or pessary as told by your health care provider.  If needed, wear pads to absorb urine leakage.  Keep a log to track how much and when you drink, and when you need to urinate. This will help your health care provider monitor your condition.  Keep all follow-up visits. This is important. Contact a health care provider if:  You have a fever or chills.  Your symptoms do not get better with treatment.  Your pain and discomfort get worse.  You have more frequent urges to urinate. Get help right away if:  You are not able to control your bladder. Summary  Overactive bladder refers to a condition in which a person has a sudden and frequent need to urinate.  Several conditions may lead to an overactive bladder.  Treatment for overactive bladder depends on the cause and severity of your condition.  Making lifestyle changes, doing Kegel exercises, keeping a log, and taking medicines can help with this condition. This information is not intended to replace advice given to you by your health care provider. Make sure you discuss any questions you have with your health care provider. Document Revised: 04/06/2020 Document Reviewed: 04/06/2020 Elsevier Patient Education  2021 [employer] Inc.  

## 2020-11-27 LAB — URINALYSIS, COMPLETE
Bilirubin, UA: NEGATIVE
Glucose, UA: NEGATIVE
Ketones, UA: NEGATIVE
Nitrite, UA: NEGATIVE
Protein,UA: NEGATIVE
RBC, UA: NEGATIVE
Specific Gravity, UA: 1.02 (ref 1.005–1.030)
Urobilinogen, Ur: 0.2 mg/dL (ref 0.2–1.0)
pH, UA: 6.5 (ref 5.0–7.5)

## 2020-11-27 LAB — MICROSCOPIC EXAMINATION
Bacteria, UA: NONE SEEN
Epithelial Cells (non renal): NONE SEEN /hpf (ref 0–10)

## 2020-11-29 LAB — CULTURE, URINE COMPREHENSIVE

## 2020-12-01 ENCOUNTER — Telehealth: Payer: Self-pay

## 2020-12-01 DIAGNOSIS — N390 Urinary tract infection, site not specified: Secondary | ICD-10-CM

## 2020-12-01 DIAGNOSIS — B952 Enterococcus as the cause of diseases classified elsewhere: Secondary | ICD-10-CM

## 2020-12-01 MED ORDER — CIPROFLOXACIN HCL 500 MG PO TABS: 500.0000 mg | ORAL_TABLET | Freq: Two times a day (BID) | ORAL | 0 refills | Status: AC

## 2020-12-01 NOTE — Telephone Encounter (Signed)
Called pt informed him of the information below. Pt gave verbal understanding. RX sent in.  °

## 2020-12-01 NOTE — Telephone Encounter (Signed)
-----   Message from Billey Co, MD sent at 12/01/2020  8:51 AM EDT ----- His urine grew a small amount of bacteria, that may represent a UTI or prostatitis.  Please prescribe Cipro 500 mg twice daily x14 days, thanks  Nickolas Madrid, MD 12/01/2020

## 2020-12-03 ENCOUNTER — Ambulatory Visit (INDEPENDENT_AMBULATORY_CARE_PROVIDER_SITE_OTHER): Payer: Medicare Other | Admitting: Dermatology

## 2020-12-03 ENCOUNTER — Other Ambulatory Visit: Payer: Self-pay

## 2020-12-03 DIAGNOSIS — L2081 Atopic neurodermatitis: Secondary | ICD-10-CM

## 2020-12-03 DIAGNOSIS — L853 Xerosis cutis: Secondary | ICD-10-CM

## 2020-12-03 MED ORDER — DUPILUMAB 300 MG/2ML ~~LOC~~ SOSY
300.0000 mg | PREFILLED_SYRINGE | Freq: Once | SUBCUTANEOUS | Status: AC
  Administered 2020-12-03: 300 mg via SUBCUTANEOUS

## 2020-12-03 NOTE — Patient Instructions (Signed)

## 2020-12-03 NOTE — Progress Notes (Signed)
   Follow-Up Visit   Subjective  Cody Bridges is a 87 y.o. male who presents for the following: atopic neurodermatitis (Stable on Dupixent injections - patient no longer itching and scratching at skin).  The following portions of the chart were reviewed this encounter and updated as appropriate:   Tobacco  Allergies  Meds  Problems  Med Hx  Surg Hx  Fam Hx     Review of Systems:  No other skin or systemic complaints except as noted in HPI or Assessment and Plan.  Objective  Well appearing patient in no apparent distress; mood and affect are within normal limits.  A focused examination was performed including the trunk and extremities. Relevant physical exam findings are noted in the Assessment and Plan.  Objective  Trunk, extremities: Xerosis otherwise clear.    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 It is often associated with allergic rhinitis and/or asthma and can require treatment with topical medications, phototherapy, or in severe cases a biologic medication called Dupixent in older children and adults.    Continue Dupixent injections SQ QOW.   Dupixent 300mg /57mL injected SQ into the L upper arm post. Patient tolerated injection well. AL, CMA  dupilumab (DUPIXENT) prefilled syringe 300 mg - Trunk, extremities  Xerosis - diffuse xerotic patches - recommend gentle, hydrating skin care - gentle skin care handout given - CeraVe cream  Return in about 2 weeks (around 12/17/2020) for Dupixent injection with nurse.  Luther Redo, CMA, am acting as scribe for Sarina Ser, MD .  Documentation: I have reviewed the above documentation for accuracy and completeness, and I agree with the above.  Sarina Ser, MD

## 2020-12-07 ENCOUNTER — Encounter: Payer: Self-pay | Admitting: Dermatology

## 2020-12-15 DIAGNOSIS — I70219 Atherosclerosis of native arteries of extremities with intermittent claudication, unspecified extremity: Secondary | ICD-10-CM | POA: Diagnosis not present

## 2020-12-15 DIAGNOSIS — I10 Essential (primary) hypertension: Secondary | ICD-10-CM | POA: Diagnosis not present

## 2020-12-15 DIAGNOSIS — I6523 Occlusion and stenosis of bilateral carotid arteries: Secondary | ICD-10-CM | POA: Diagnosis not present

## 2020-12-15 DIAGNOSIS — E782 Mixed hyperlipidemia: Secondary | ICD-10-CM | POA: Diagnosis not present

## 2020-12-17 ENCOUNTER — Other Ambulatory Visit: Payer: Self-pay

## 2020-12-17 ENCOUNTER — Ambulatory Visit (INDEPENDENT_AMBULATORY_CARE_PROVIDER_SITE_OTHER): Payer: Medicare Other

## 2020-12-17 DIAGNOSIS — L209 Atopic dermatitis, unspecified: Secondary | ICD-10-CM | POA: Diagnosis not present

## 2020-12-17 NOTE — Progress Notes (Signed)
Patient here today for two week Dupixent injection.  Dupixent 300mg /2mL injected into right arm. Patient tolerated well.   KHV:[id number] EXP:05/2023

## 2020-12-20 ENCOUNTER — Other Ambulatory Visit: Payer: Self-pay | Admitting: Family Medicine

## 2020-12-20 DIAGNOSIS — R0981 Nasal congestion: Secondary | ICD-10-CM

## 2020-12-22 DIAGNOSIS — M79674 Pain in right toe(s): Secondary | ICD-10-CM | POA: Diagnosis not present

## 2020-12-22 DIAGNOSIS — D2372 Other benign neoplasm of skin of left lower limb, including hip: Secondary | ICD-10-CM | POA: Diagnosis not present

## 2020-12-22 DIAGNOSIS — B351 Tinea unguium: Secondary | ICD-10-CM | POA: Diagnosis not present

## 2020-12-22 DIAGNOSIS — D2371 Other benign neoplasm of skin of right lower limb, including hip: Secondary | ICD-10-CM | POA: Diagnosis not present

## 2020-12-22 DIAGNOSIS — M79675 Pain in left toe(s): Secondary | ICD-10-CM | POA: Diagnosis not present

## 2020-12-31 ENCOUNTER — Other Ambulatory Visit: Payer: Self-pay

## 2020-12-31 ENCOUNTER — Ambulatory Visit (INDEPENDENT_AMBULATORY_CARE_PROVIDER_SITE_OTHER): Payer: Medicare Other

## 2020-12-31 DIAGNOSIS — L209 Atopic dermatitis, unspecified: Secondary | ICD-10-CM

## 2020-12-31 NOTE — Progress Notes (Signed)
Patient here for two week Dupixent injection.  Dupixent 300mg /8mL injected into left upper arm. Patient tolerated well.  LOT: [id number] EXP: 11/2022

## 2021-01-06 ENCOUNTER — Ambulatory Visit (INDEPENDENT_AMBULATORY_CARE_PROVIDER_SITE_OTHER): Payer: Medicare Other | Admitting: Urology

## 2021-01-06 ENCOUNTER — Encounter: Payer: Self-pay | Admitting: Urology

## 2021-01-06 ENCOUNTER — Other Ambulatory Visit: Payer: Self-pay

## 2021-01-06 VITALS — BP 130/64 | HR 54 | Ht 70.0 in | Wt 201.0 lb

## 2021-01-06 DIAGNOSIS — N401 Enlarged prostate with lower urinary tract symptoms: Secondary | ICD-10-CM

## 2021-01-06 DIAGNOSIS — R350 Frequency of micturition: Secondary | ICD-10-CM

## 2021-01-06 DIAGNOSIS — N3281 Overactive bladder: Secondary | ICD-10-CM

## 2021-01-06 NOTE — Progress Notes (Signed)
Cystoscopy Procedure Note:  Indication: Urinary urgency/frequency/urge incontinence  Urinalysis at last visit was relatively benign but culture ultimately grew 10-25k Enterococcus and he was treated with a 2-week course of antibiotics.  He denies any improvement in his urinary symptoms after antibiotic, and suspect this was just contaminant-not true UTI.  After informed consent and discussion of the procedure and its risks, Roosvelt [place]well Collignon was positioned and prepped in the standard fashion. Cystoscopy was performed with a flexible cystoscope. The urethra, bladder neck and entire bladder was visualized in a standard fashion. The prostate was moderate in size, no median lobe. The ureteral orifices were visualized in their normal location and orientation.  Subtle herniation of bladder into the right groin, not significantly changed in CT from 2018.  No abnormalities on retroflexion.  Bladder mucosa grossly normal throughout.  No urethral abnormalities.  Imaging: CT A/P 2018 with 40 g prostate, subtle herniation of small part of bladder dome into a right inguinal hernia  Findings: Essentially normal cystoscopy, suspect more overactive bladder and high soda intake as etiology of his urinary symptoms  Assessment and Plan: Behavioral strategies discussed, recommended a trial of Myrbetriq 50 mg daily, samples given RTC with PA 1 month PVR and symptom check  Nickolas Madrid, MD 01/06/2021

## 2021-01-06 NOTE — Addendum Note (Signed)
Addended by: Despina Hidden on: 01/06/2021 10:58 AM   Modules accepted: Orders

## 2021-01-06 NOTE — Patient Instructions (Signed)
Overactive Bladder, Adult  Overactive bladder is a condition in which a person has a sudden and frequent need to urinate. A person might also leak urine if he or she cannot get to the bathroom fast enough (urinary incontinence). Sometimes, symptoms can interfere with work or social activities. What are the causes? Overactive bladder is associated with poor nerve signals between your bladder and your brain. Your bladder may get the signal to empty before it is full. You may also have very sensitive muscles that make your bladder squeeze too soon. This condition may also be caused by other factors, such as:  Medical conditions: ? Urinary tract infection. ? Infection of nearby tissues. ? Prostate enlargement. ? Bladder stones, inflammation, or tumors. ? Diabetes. ? Muscle or nerve weakness, especially from these conditions:  A spinal cord injury.  Stroke.  Multiple sclerosis.  Parkinson's disease.  Other causes: ? Surgery on the uterus or urethra. ? Drinking too much caffeine or alcohol. ? Certain medicines, especially those that eliminate extra fluid in the body (diuretics). ? Constipation. What increases the risk? You may be at greater risk for overactive bladder if you:  Are an older adult.  Smoke.  Are going through menopause.  Have prostate problems.  Have a neurological disease, such as stroke, dementia, Parkinson's disease, or multiple sclerosis (MS).  Eat or drink alcohol, spicy food, caffeine, and other things that irritate the bladder.  Are overweight or obese. What are the signs or symptoms? Symptoms of this condition include a sudden, strong urge to urinate. Other symptoms include:  Leaking urine.  Urinating 8 or more times a day.  Waking up to urinate 2 or more times overnight. How is this diagnosed? This condition may be diagnosed based on:  Your symptoms and medical history.  A physical exam.  Blood or urine tests to check for possible causes,  such as infection. You may also need to see a health care provider who specializes in urinary tract problems. This is called a urologist. How is this treated? Treatment for overactive bladder depends on the cause of your condition and whether it is mild or severe. Treatment may include:  Bladder training, such as: ? Learning to control the urge to urinate by following a schedule to urinate at regular intervals. ? Doing Kegel exercises to strengthen the pelvic floor muscles that support your bladder.  Special devices, such as: ? Biofeedback. This uses sensors to help you become aware of your body's signals. ? Electrical stimulation. This uses electrodes placed inside the body (implanted) or outside the body. These electrodes send gentle pulses of electricity to strengthen the nerves or muscles that control the bladder. ? Women may use a plastic device, called a pessary, that fits into the vagina and supports the bladder.  Medicines, such as: ? Antibiotics to treat bladder infection. ? Antispasmodics to stop the bladder from releasing urine at the wrong time. ? Tricyclic antidepressants to relax bladder muscles. ? Injections of botulinum toxin type A directly into the bladder tissue to relax bladder muscles.  Surgery, such as: ? A device may be implanted to help manage the nerve signals that control urination. ? An electrode may be implanted to stimulate electrical signals in the bladder. ? A procedure may be done to change the shape of the bladder. This is done only in very severe cases. Follow these instructions at home: Eating and drinking  Make diet or lifestyle changes recommended by your health care provider. These may include: ? Drinking fluids   throughout the day and not only with meals. ? Cutting down on caffeine or alcohol. ? Eating a healthy and balanced diet to prevent constipation. This may include:  Choosing foods that are high in fiber, such as beans, whole grains, and  fresh fruits and vegetables.  Limiting foods that are high in fat and processed sugars, such as fried and sweet foods.   Lifestyle  Lose weight if needed.  Do not use any products that contain nicotine or tobacco. These include cigarettes, chewing tobacco, and vaping devices, such as e-cigarettes. If you need help quitting, ask your health care provider.   General instructions  Take over-the-counter and prescription medicines only as told by your health care provider.  If you were prescribed an antibiotic medicine, take it as told by your health care provider. Do not stop taking the antibiotic even if you start to feel better.  Use any implants or pessary as told by your health care provider.  If needed, wear pads to absorb urine leakage.  Keep a log to track how much and when you drink, and when you need to urinate. This will help your health care provider monitor your condition.  Keep all follow-up visits. This is important. Contact a health care provider if:  You have a fever or chills.  Your symptoms do not get better with treatment.  Your pain and discomfort get worse.  You have more frequent urges to urinate. Get help right away if:  You are not able to control your bladder. Summary  Overactive bladder refers to a condition in which a person has a sudden and frequent need to urinate.  Several conditions may lead to an overactive bladder.  Treatment for overactive bladder depends on the cause and severity of your condition.  Making lifestyle changes, doing Kegel exercises, keeping a log, and taking medicines can help with this condition. This information is not intended to replace advice given to you by your health care provider. Make sure you discuss any questions you have with your health care provider. Document Revised: 04/06/2020 Document Reviewed: 04/06/2020 Elsevier Patient Education  2021 [employer] Inc.  

## 2021-01-14 ENCOUNTER — Ambulatory Visit (INDEPENDENT_AMBULATORY_CARE_PROVIDER_SITE_OTHER): Payer: Medicare Other

## 2021-01-14 ENCOUNTER — Other Ambulatory Visit: Payer: Self-pay

## 2021-01-14 DIAGNOSIS — L209 Atopic dermatitis, unspecified: Secondary | ICD-10-CM

## 2021-01-14 NOTE — Progress Notes (Signed)
Patient here for two week Dupixent injection.   Dupixent 300mg /84mL injected SQ into right arm. Patient tolerated well.   LOT: [id number] EXP: 08/2022

## 2021-01-20 ENCOUNTER — Telehealth: Payer: Self-pay | Admitting: Family Medicine

## 2021-01-20 NOTE — Telephone Encounter (Signed)
PTs friend came into the office to drop off paperwork.

## 2021-01-21 MED ORDER — ALBUTEROL SULFATE HFA 108 (90 BASE) MCG/ACT IN AERS: INHALATION_SPRAY | RESPIRATORY_TRACT | 2 refills | Status: DC

## 2021-01-21 NOTE — Telephone Encounter (Signed)
Paperwork stated patient needed a rx for generic albuterol or to do a PA. Generic has been resent to pharmacy.

## 2021-01-21 NOTE — Addendum Note (Signed)
Addended byElpidio Galea T on: 01/21/2021 09:14 AM   Modules accepted: Orders

## 2021-01-28 ENCOUNTER — Ambulatory Visit (INDEPENDENT_AMBULATORY_CARE_PROVIDER_SITE_OTHER): Payer: Medicare Other | Admitting: Dermatology

## 2021-01-28 ENCOUNTER — Other Ambulatory Visit: Payer: Self-pay

## 2021-01-28 DIAGNOSIS — L209 Atopic dermatitis, unspecified: Secondary | ICD-10-CM | POA: Diagnosis not present

## 2021-01-28 DIAGNOSIS — L2081 Atopic neurodermatitis: Secondary | ICD-10-CM

## 2021-01-28 DIAGNOSIS — L570 Actinic keratosis: Secondary | ICD-10-CM | POA: Diagnosis not present

## 2021-01-28 DIAGNOSIS — L820 Inflamed seborrheic keratosis: Secondary | ICD-10-CM

## 2021-01-28 MED ORDER — DUPILUMAB 300 MG/2ML ~~LOC~~ SOAJ: 300.0000 mg | Freq: Once | SUBCUTANEOUS | Status: DC

## 2021-01-28 NOTE — Patient Instructions (Addendum)

## 2021-01-28 NOTE — Progress Notes (Signed)
   Follow-Up Visit   Subjective  Cody Bridges is a 87 y.o. male who presents for the following: Dermatitis (2 weeks f/u atopic dermatitis, stable on Dupixent injection).  The following portions of the chart were reviewed this encounter and updated as appropriate:   Tobacco  Allergies  Meds  Problems  Med Hx  Surg Hx  Fam Hx     Review of Systems:  No other skin or systemic complaints except as noted in HPI or Assessment and Plan.  Objective  Well appearing patient in no apparent distress; mood and affect are within normal limits.  A focused examination was performed including face, arms, hands, ears. Relevant physical exam findings are noted in the Assessment and Plan.  right ear x 2 (2) Erythematous thin papules/macules with gritty scale.   right hand x 2 (2) Erythematous keratotic or waxy stuck-on papule or plaque.   face, exts Pink scaly patches at eyelids   Assessment & Plan  AK (actinic keratosis) (2) right ear x 2  Destruction of lesion - right ear x 2 Complexity: simple   Destruction method: cryotherapy   Informed consent: discussed and consent obtained   Timeout:  patient name, date of birth, surgical site, and procedure verified Lesion destroyed using liquid nitrogen: Yes   Region frozen until ice ball extended beyond lesion: Yes   Outcome: patient tolerated procedure well with no complications   Post-procedure details: wound care instructions given    Inflamed seborrheic keratosis right hand x 2  Destruction of lesion - right hand x 2 Complexity: simple   Destruction method: cryotherapy   Informed consent: discussed and consent obtained   Timeout:  patient name, date of birth, surgical site, and procedure verified Lesion destroyed using liquid nitrogen: Yes   Region frozen until ice ball extended beyond lesion: Yes   Outcome: patient tolerated procedure well with no complications   Post-procedure details: wound care instructions given    Atopic  neurodermatitis face, exts  Severe, controlled on systemic Dupixent injections -   Atopic dermatitis - Severe, on Dupixent (biologic medication).  Atopic dermatitis (eczema) is a chronic, relapsing, pruritic condition that can significantly affect quality of life. It is often associated with allergic rhinitis and/or asthma and can require treatment with topical medications, phototherapy, or in severe cases a biologic medication called Dupixent.    Dupixent 300mg /21mL injected SQ into the left upper arm post. Patient tolerated injection well.  Lot # [id number] Exp 05/2023  Related Medications Dupilumab SOPN 300 mg   Return in about 2 weeks (around 02/11/2021) for with a nurse for injection, see Dr Raliegh Ip in 2 months.  IMarye Round, CMA, am acting as scribe for Sarina Ser, MD .  Documentation: I have reviewed the above documentation for accuracy and completeness, and I agree with the above.  Sarina Ser, MD

## 2021-02-02 NOTE — Progress Notes (Signed)
02/03/2021 11:57 AM   Cody Bridges [birth year] [id number]  Referring provider: Leone Haven, MD [address] [place],  [place] [address]  Urological history: 1. Urge incontinence -contributing factors of age, BPH, bladder herniation, COPD, DM, arthritis, pedal edema, antihistamines and diuretics -CT A/P 2018 with 40 g prostate, subtle herniation of small part of bladder dome into a right inguinal hernia -cysto 12/2020 prostate was moderate in size, no median lobe. The ureteral orifices were visualized in their normal location and orientation.  Subtle herniation of bladder into the right groin, not significantly changed in CT from 2018.  No abnormalities on retroflexion.  Bladder mucosa grossly normal throughout.  No urethral abnormalities -managed with Myrbetriq 50 mg daily  -PVR 77 mL   Chief Complaint  Patient presents with   Follow-up   Urinary Incontinence    HPI: Cody Bridges is a 87 y.o. male who presents today for follow up regarding Myrbetriq 50 mg daily with his friend, Cody Bridges.    He is still experiencing nocturia x 3, daytime frequency, urgency, urge incontinence and difficulty urinating.   He feels the Myrbetriq was not helpful.   He drinks 2 Coca-Colas daily, 2 cups of coffee in the am and eats late in the evening and has a beer and wine with his meal.    Patient denies any modifying or aggravating factors.  Patient denies any gross hematuria, dysuria or suprapubic/flank pain.  Patient denies any fevers, chills, nausea or vomiting.    PVR 77 mL.    IPSS     Row Name 02/03/21 1000         International Prostate Symptom Score   How often have you had the sensation of not emptying your bladder? More than half the time     How often have you had to urinate less than every two hours? Less than half the time     How often have you found you stopped and started again several times when you urinated? Less than half the time     How often have  you found it difficult to postpone urination? Less than half the time     How often have you had a weak urinary stream? Less than half the time     How often have you had to strain to start urination? Less than half the time     How many times did you typically get up at night to urinate? 3 Times     Total IPSS Score 17           Quality of Life due to urinary symptoms     If you were to spend the rest of your life with your urinary condition just the way it is now how would you feel about that? Unhappy             Score:  1-7 Mild 8-19 Moderate 20-35 Severe     PMH: Past Medical History:  Diagnosis Date   Arthritis    Asthma    COPD (chronic obstructive pulmonary disease) (HCC)    Diabetes mellitus without complication ([place])     5465   Dyspnea    with exertion   Edema    GERD (gastroesophageal reflux disease)    HOH (hard of hearing)    AIDS   Hyperlipidemia    Hypertension    Wheezing     Surgical History: Past Surgical History:  Procedure Laterality Date   BUNIONECTOMY Right  CATARACT EXTRACTION W/PHACO Left 08/22/2017   Procedure: CATARACT EXTRACTION PHACO AND INTRAOCULAR LENS PLACEMENT (IOC);  Surgeon: Birder Robson, MD;  Location: [facility] ORS;  Service: Ophthalmology;  Laterality: Left;  [place] 00:43.7 AP% 12.3 CDE 5.38 Fluid pack lot # [id number]   COLONOSCOPY  2016   ELECTROMAGNETIC NAVIGATION BROCHOSCOPY N/A 02/09/2017   Procedure: ELECTROMAGNETIC NAVIGATION BRONCHOSCOPY;  Surgeon: Flora Lipps, MD;  Location: [facility] ORS;  Service: Cardiopulmonary;  Laterality: N/A;   EYE SURGERY Right    Cataract Extraction with IOL   HAMMER TOE SURGERY Right    TONSILLECTOMY  1938    Home Medications:  Allergies as of 02/03/2021       Reactions   Clindamycin Hives   Lisinopril Cough   Penicillin V Potassium Rash, Other (See Comments)   Has patient had a PCN reaction causing immediate rash, facial/tongue/throat swelling, SOB or lightheadedness with hypotension:  Unknown Has patient had a PCN reaction causing severe rash involving mucus membranes or skin necrosis: Unknown Has patient had a PCN reaction that required hospitalization: No Has patient had a PCN reaction occurring within the last 10 years: No If all of the above answers are "NO", then may proceed with Cephalosporin use.        Medication List        Accurate as of February 03, 2021 11:59 PM. If you have any questions, ask your nurse or doctor.          Accu-Chek Guide test strip Generic drug: glucose blood USE UP TO 4 TIMES DAILY AS DIRECTED   albuterol 108 (90 Base) MCG/ACT inhaler Commonly known as: VENTOLIN HFA INHALE 1-2 PUFFS BY MOUTH EVERY 6 HOURS AS NEEDED FOR WHEEZE OR SHORTNESS OF BREATH   amLODipine 5 MG tablet Commonly known as: NORVASC Take 5 mg by mouth daily.   Blink Tears 0.25 % Soln Generic drug: Polyethylene Glycol 400 Apply to eye.   blood glucose meter kit and supplies Kit Dispense based on patient and insurance preference. Use up to four times daily as directed. (FOR ICD-10  E11.9).   budesonide-formoterol 160-4.5 MCG/ACT inhaler Commonly known as: SYMBICORT Inhale 2 puffs into the lungs 2 (two) times daily as needed (for shortness of breath).   calcium carbonate 500 MG chewable tablet Commonly known as: TUMS - dosed in mg elemental calcium Chew 1-2 tablets by mouth 3 (three) times daily as needed (for acid reflux/indigestion.).   Dupixent 300 MG/2ML prefilled syringe Generic drug: dupilumab INJECT 1 SYRINGE UNDER THE SKIN EVERY OTHER WEEK   Eucrisa 2 % Oint Generic drug: Crisaborole Apply 1 application topically daily as needed.   fluticasone 50 MCG/ACT nasal spray Commonly known as: FLONASE SPRAY 2 SPRAYS INTO EACH NOSTRIL EVERY DAY   Gemtesa 75 MG Tabs Generic drug: Vibegron Take 75 mg by mouth daily. Started by: Zara Council, PA-C   hydrochlorothiazide 25 MG tablet Commonly known as: HYDRODIURIL TAKE 1 TABLET BY MOUTH EVERY  DAY   loratadine 10 MG tablet Commonly known as: CLARITIN TAKE 1 TABLET BY MOUTH EVERY DAY   metFORMIN 500 MG tablet Commonly known as: GLUCOPHAGE TAKE 1 TABLET BY MOUTH TWICE A DAY   multivitamin with minerals Tabs tablet Take 1 tablet by mouth daily.   omeprazole 20 MG capsule Commonly known as: PRILOSEC Take 1 capsule (20 mg total) by mouth daily.   PRESERVISION AREDS PO Take by mouth.   rosuvastatin 20 MG tablet Commonly known as: CRESTOR TAKE 1 TABLET BY MOUTH EVERY DAY   tamsulosin 0.4  MG Caps capsule Commonly known as: FLOMAX TAKE 1 CAPSULE BY MOUTH EVERY DAY   Trelegy Ellipta 100-62.5-25 MCG/INH Aepb Generic drug: Fluticasone-Umeclidin-Vilant Take 1 puff by mouth daily.   triamcinolone lotion 0.1 % Commonly known as: KENALOG Apply 1 application topically 2 (two) times daily as needed.   Vitamin D-3 125 MCG (5000 UT) Tabs Take 5,000 Units by mouth daily.        Allergies:  Allergies  Allergen Reactions   Clindamycin Hives   Lisinopril Cough   Penicillin V Potassium Rash and Other (See Comments)    Has patient had a PCN reaction causing immediate rash, facial/tongue/throat swelling, SOB or lightheadedness with hypotension: Unknown Has patient had a PCN reaction causing severe rash involving mucus membranes or skin necrosis: Unknown Has patient had a PCN reaction that required hospitalization: No Has patient had a PCN reaction occurring within the last 10 years: No If all of the above answers are "NO", then may proceed with Cephalosporin use.     Family History: Family History  Problem Relation Age of Onset   Cancer Mother    Stomach cancer Mother    Stomach cancer Sister     Social History:  reports that he quit smoking about 35 years ago. His smoking use included cigarettes. He has a 70.00 pack-year smoking history. He has never used smokeless tobacco. He reports current alcohol use. He reports that he does not use drugs.  ROS: Pertinent ROS  in HPI  Physical Exam: BP (!) 124/56   Pulse 96   Ht '5\' 10"'  (1.778 m)   Wt 199 lb (90.3 kg)   BMI 28.55 kg/m   Constitutional:  Well nourished. Alert and oriented, No acute distress. HEENT: [place] AT, mask in place.  Trachea midline Cardiovascular: No clubbing, cyanosis, or edema. Respiratory: Normal respiratory effort, no increased work of breathing. Neurologic: Grossly intact, no focal deficits, moving all 4 extremities. Psychiatric: Normal mood and affect.  Laboratory Data: Lab Results  Component Value Date   WBC 9.9 08/26/2020   HGB 13.0 08/26/2020   HCT 39.9 08/26/2020   MCV 96.1 08/26/2020   PLT 379.0 08/26/2020    Lab Results  Component Value Date   CREATININE 1.06 11/10/2020    Lab Results  Component Value Date   PSA 3.64 08/26/2020   PSA 3.3 03/02/2018    Lab Results  Component Value Date   HGBA1C 6.8 (H) 08/26/2020    Lab Results  Component Value Date   TSH 2.69 08/29/2018       Component Value Date/Time   CHOL 93 08/26/2020 0911   HDL 36.30 (L) 08/26/2020 0911   CHOLHDL 3 08/26/2020 0911   VLDL 26.6 08/26/2020 0911   LDLCALC 30 08/26/2020 0911    Lab Results  Component Value Date   AST 15 08/26/2020   Lab Results  Component Value Date   ALT 18 08/26/2020    Urinalysis    Component Value Date/Time   COLORURINE YELLOW 08/12/2020 1605   APPEARANCEUR Clear 11/25/2020 0856   LABSPEC 1.015 08/12/2020 1605   PHURINE 6.0 08/12/2020 1605   GLUCOSEU Negative 11/25/2020 0856   GLUCOSEU NEGATIVE 08/12/2020 1605   HGBUR NEGATIVE 08/12/2020 1605   BILIRUBINUR Negative 11/25/2020 [place] 08/12/2020 1605   PROTEINUR Negative 11/25/2020 0856   UROBILINOGEN 0.2 11/10/2020 1208   UROBILINOGEN 0.2 08/12/2020 1605   NITRITE Negative 11/25/2020 0856   NITRITE NEGATIVE 08/12/2020 1605   LEUKOCYTESUR Trace (A) 11/25/2020 0856   LEUKOCYTESUR  NEGATIVE 08/12/2020 1605  I have reviewed the labs.   Pertinent Imaging: Results for  LINARD, DAFT (MRN [id number]) as of 02/03/2021 11:09  Ref. Range 02/03/2021 10:30  Scan Result Unknown 3m   Assessment & Plan:    1. Urge incontinence -Myrbetriq was not helpful -discussed taking his HCTZ in the am -discussed taking Gemtesa or trying PTNS treatments -he would like to be considered for PTNS treatments, so we will go ahead and get PA from his insurance -explained the PTNS provides treatment by indirectly providing electrical stimulation to the nerves responsible for bladder and pelvic floor function - a needle electrode generates an adjustable electrical pulse that travels to the sacral plexus via the tibial nerve which is located in the ankle, among other functions, the sacral nerve plexus regulates bladder and pelvic floor function - treatment protocol requires once-a-week treatments for 12 weeks, 30 minutes per session and many patients begin to see improvements by the 6th treatment. Patients who respond to treatment may require occasional treatments (~ once every 3 weeks) to sustain improvements. PTNS is a low-risk procedure. The most common side-effects with PTNS treatment are temporary and minor, resulting from the placement of the needle electrode. They include minor bleeding, mild pain and skin inflammation and patients have seen up to an 80% success rate with this form of treatment   Return for pending PTNS PA .  These notes generated with voice recognition software. I apologize for typographical errors.  SZara Council PA-C  [address] [place] [phone]  I spent 25 minutes on the day of the encounter to include pre-visit record review, face-to-face time with the patient, and post-visit ordering of tests.

## 2021-02-03 ENCOUNTER — Ambulatory Visit (INDEPENDENT_AMBULATORY_CARE_PROVIDER_SITE_OTHER): Payer: Medicare Other | Admitting: Urology

## 2021-02-03 ENCOUNTER — Encounter: Payer: Self-pay | Admitting: Urology

## 2021-02-03 ENCOUNTER — Other Ambulatory Visit: Payer: Self-pay

## 2021-02-03 VITALS — BP 124/56 | HR 96 | Ht 70.0 in | Wt 199.0 lb

## 2021-02-03 DIAGNOSIS — N3941 Urge incontinence: Secondary | ICD-10-CM

## 2021-02-03 LAB — BLADDER SCAN AMB NON-IMAGING

## 2021-02-03 MED ORDER — GEMTESA 75 MG PO TABS: 75.0000 mg | ORAL_TABLET | Freq: Every day | ORAL | 0 refills | Status: DC

## 2021-02-11 ENCOUNTER — Other Ambulatory Visit: Payer: Self-pay

## 2021-02-11 ENCOUNTER — Ambulatory Visit (INDEPENDENT_AMBULATORY_CARE_PROVIDER_SITE_OTHER): Payer: Medicare Other

## 2021-02-11 DIAGNOSIS — L209 Atopic dermatitis, unspecified: Secondary | ICD-10-CM

## 2021-02-11 NOTE — Progress Notes (Signed)
Patient here for two week Dupixent injection.  Dupixent injected in to right upper arm. Patient tolerated well.  LOT: [id number] EXP: 05/2023

## 2021-02-13 ENCOUNTER — Other Ambulatory Visit: Payer: Self-pay

## 2021-02-13 ENCOUNTER — Inpatient Hospital Stay
Admission: EM | Admit: 2021-02-13 | Discharge: 2021-02-16 | DRG: 179 | Disposition: A | Discharge status: Home Health | Payer: Medicare Other | Attending: Internal Medicine | Admitting: Internal Medicine

## 2021-02-13 ENCOUNTER — Emergency Department: Payer: Medicare Other

## 2021-02-13 DIAGNOSIS — Z7984 Long term (current) use of oral hypoglycemic drugs: Secondary | ICD-10-CM

## 2021-02-13 DIAGNOSIS — L209 Atopic dermatitis, unspecified: Secondary | ICD-10-CM | POA: Diagnosis present

## 2021-02-13 DIAGNOSIS — Z880 Allergy status to penicillin: Secondary | ICD-10-CM | POA: Diagnosis not present

## 2021-02-13 DIAGNOSIS — R0981 Nasal congestion: Secondary | ICD-10-CM

## 2021-02-13 DIAGNOSIS — R0602 Shortness of breath: Secondary | ICD-10-CM | POA: Diagnosis present

## 2021-02-13 DIAGNOSIS — J440 Chronic obstructive pulmonary disease with acute lower respiratory infection: Secondary | ICD-10-CM | POA: Diagnosis present

## 2021-02-13 DIAGNOSIS — R350 Frequency of micturition: Secondary | ICD-10-CM | POA: Diagnosis present

## 2021-02-13 DIAGNOSIS — E782 Mixed hyperlipidemia: Secondary | ICD-10-CM | POA: Diagnosis present

## 2021-02-13 DIAGNOSIS — J449 Chronic obstructive pulmonary disease, unspecified: Secondary | ICD-10-CM | POA: Diagnosis present

## 2021-02-13 DIAGNOSIS — K219 Gastro-esophageal reflux disease without esophagitis: Secondary | ICD-10-CM | POA: Diagnosis present

## 2021-02-13 DIAGNOSIS — I4510 Unspecified right bundle-branch block: Secondary | ICD-10-CM | POA: Diagnosis present

## 2021-02-13 DIAGNOSIS — Z79899 Other long term (current) drug therapy: Secondary | ICD-10-CM | POA: Diagnosis not present

## 2021-02-13 DIAGNOSIS — Z888 Allergy status to other drugs, medicaments and biological substances status: Secondary | ICD-10-CM

## 2021-02-13 DIAGNOSIS — N3281 Overactive bladder: Secondary | ICD-10-CM | POA: Diagnosis present

## 2021-02-13 DIAGNOSIS — I10 Essential (primary) hypertension: Secondary | ICD-10-CM | POA: Diagnosis present

## 2021-02-13 DIAGNOSIS — Z881 Allergy status to other antibiotic agents status: Secondary | ICD-10-CM | POA: Diagnosis not present

## 2021-02-13 DIAGNOSIS — N401 Enlarged prostate with lower urinary tract symptoms: Secondary | ICD-10-CM | POA: Diagnosis present

## 2021-02-13 DIAGNOSIS — E119 Type 2 diabetes mellitus without complications: Secondary | ICD-10-CM

## 2021-02-13 DIAGNOSIS — Z87891 Personal history of nicotine dependence: Secondary | ICD-10-CM

## 2021-02-13 DIAGNOSIS — Z961 Presence of intraocular lens: Secondary | ICD-10-CM | POA: Diagnosis present

## 2021-02-13 DIAGNOSIS — H9190 Unspecified hearing loss, unspecified ear: Secondary | ICD-10-CM | POA: Diagnosis present

## 2021-02-13 DIAGNOSIS — Z9842 Cataract extraction status, left eye: Secondary | ICD-10-CM | POA: Diagnosis not present

## 2021-02-13 DIAGNOSIS — U071 COVID-19: Secondary | ICD-10-CM | POA: Diagnosis present

## 2021-02-13 DIAGNOSIS — B20 Human immunodeficiency virus [HIV] disease: Secondary | ICD-10-CM | POA: Diagnosis present

## 2021-02-13 HISTORY — DX: COVID-19: U07.1

## 2021-02-13 LAB — COMPREHENSIVE METABOLIC PANEL
ALT: 21 U/L (ref 0–44)
AST: 25 U/L (ref 15–41)
Albumin: 3.8 g/dL (ref 3.5–5.0)
Alkaline Phosphatase: 49 U/L (ref 38–126)
Anion gap: 11 (ref 5–15)
BUN: 19 mg/dL (ref 8–23)
CO2: 25 mmol/L (ref 22–32)
Calcium: 9.2 mg/dL (ref 8.9–10.3)
Chloride: 100 mmol/L (ref 98–111)
Creatinine, Ser: 1.04 mg/dL (ref 0.61–1.24)
GFR, Estimated: >60 mL/min (ref >60)
Glucose, Bld: 111 mg/dL — ABNORMAL HIGH (ref 70–99)
Potassium: 4.1 mmol/L (ref 3.5–5.1)
Sodium: 136 mmol/L (ref 135–145)
Total Bilirubin: 0.7 mg/dL (ref 0.3–1.2)
Total Protein: 7.4 g/dL (ref 6.5–8.1)

## 2021-02-13 LAB — PROTIME-INR
INR: 1.1 (ref 0.8–1.2)
Prothrombin Time: 13.7 seconds (ref 11.4–15.2)

## 2021-02-13 LAB — CBC WITH DIFFERENTIAL/PLATELET
Abs Immature Granulocytes: 0.06 10*3/uL (ref 0.00–0.07)
Basophils Absolute: 0 10*3/uL (ref 0.0–0.1)
Basophils Relative: 0 %
Eosinophils Absolute: 0.1 10*3/uL (ref 0.0–0.5)
Eosinophils Relative: 1 %
HCT: 37.8 % — ABNORMAL LOW (ref 39.0–52.0)
Hemoglobin: 12.8 g/dL — ABNORMAL LOW (ref 13.0–17.0)
Immature Granulocytes: 1 %
Lymphocytes Relative: 6 %
Lymphs Abs: 0.7 10*3/uL (ref 0.7–4.0)
MCH: 32.3 pg (ref 26.0–34.0)
MCHC: 33.9 g/dL (ref 30.0–36.0)
MCV: 95.5 fL (ref 80.0–100.0)
Monocytes Absolute: 1.8 10*3/uL — ABNORMAL HIGH (ref 0.1–1.0)
Monocytes Relative: 16 %
Neutro Abs: 8.8 10*3/uL — ABNORMAL HIGH (ref 1.7–7.7)
Neutrophils Relative %: 76 %
Platelets: 322 10*3/uL (ref 150–400)
RBC: 3.96 MIL/uL — ABNORMAL LOW (ref 4.22–5.81)
RDW: 13.3 % (ref 11.5–15.5)
WBC: 11.4 10*3/uL — ABNORMAL HIGH (ref 4.0–10.5)
nRBC: 0 % (ref 0.0–0.2)

## 2021-02-13 LAB — D-DIMER, QUANTITATIVE: D-Dimer, Quant: 0.79 ug/mL-FEU — ABNORMAL HIGH (ref 0.00–0.50)

## 2021-02-13 LAB — RESP PANEL BY RT-PCR (FLU A&B, COVID) ARPGX2
Influenza A by PCR: NEGATIVE
Influenza B by PCR: NEGATIVE
SARS Coronavirus 2 by RT PCR: POSITIVE — AB

## 2021-02-13 LAB — LACTIC ACID, PLASMA
Lactic Acid, Venous: 2 mmol/L (ref 0.5–1.9)
Lactic Acid, Venous: 1.6 mmol/L (ref 0.5–1.9)

## 2021-02-13 LAB — PROCALCITONIN: Procalcitonin: <0.1 ng/mL

## 2021-02-13 MED ORDER — ACETAMINOPHEN 325 MG PO TABS
650.0000 mg | ORAL_TABLET | Freq: Once | ORAL | Status: AC | PRN
  Administered 2021-02-13: 650 mg via ORAL
  Filled 2021-02-13: qty 2

## 2021-02-13 MED ORDER — HYDRALAZINE HCL 50 MG PO TABS: 25.0000 mg | ORAL_TABLET | Freq: Three times a day (TID) | ORAL | Status: DC | PRN

## 2021-02-13 MED ORDER — CALCIUM CARBONATE ANTACID 500 MG PO CHEW: 1.0000 | CHEWABLE_TABLET | Freq: Three times a day (TID) | ORAL | Status: DC | PRN

## 2021-02-13 MED ORDER — HYDROCHLOROTHIAZIDE 25 MG PO TABS
25.0000 mg | ORAL_TABLET | Freq: Every day | ORAL | Status: DC
  Administered 2021-02-14 – 2021-02-16 (×3): 25 mg via ORAL
  Filled 2021-02-13 (×3): qty 1

## 2021-02-13 MED ORDER — PANTOPRAZOLE SODIUM 40 MG PO TBEC
40.0000 mg | DELAYED_RELEASE_TABLET | Freq: Every day | ORAL | Status: DC
  Administered 2021-02-14 – 2021-02-16 (×3): 40 mg via ORAL
  Filled 2021-02-13 (×3): qty 1

## 2021-02-13 MED ORDER — ONDANSETRON HCL 4 MG PO TABS
4.0000 mg | ORAL_TABLET | Freq: Four times a day (QID) | ORAL | Status: DC | PRN
Start: 2021-02-13 — End: 2021-02-16

## 2021-02-13 MED ORDER — ENOXAPARIN SODIUM 40 MG/0.4ML IJ SOSY
40.0000 mg | PREFILLED_SYRINGE | INTRAMUSCULAR | Status: DC
  Administered 2021-02-13 – 2021-02-15 (×3): 40 mg via SUBCUTANEOUS
  Filled 2021-02-13 (×3): qty 0.4

## 2021-02-13 MED ORDER — FLUTICASONE-UMECLIDIN-VILANT 100-62.5-25 MCG/INH IN AEPB
1.0000 | INHALATION_SPRAY | Freq: Every day | RESPIRATORY_TRACT | Status: DC
  Administered 2021-02-13: 18:00:00 1 via ORAL

## 2021-02-13 MED ORDER — AMLODIPINE BESYLATE 5 MG PO TABS
5.0000 mg | ORAL_TABLET | Freq: Every day | ORAL | Status: DC
  Administered 2021-02-14 – 2021-02-16 (×3): 5 mg via ORAL
  Filled 2021-02-13 (×3): qty 1

## 2021-02-13 MED ORDER — ONDANSETRON HCL 4 MG/2ML IJ SOLN: 4.0000 mg | Freq: Four times a day (QID) | INTRAMUSCULAR | Status: DC | PRN

## 2021-02-13 MED ORDER — SODIUM CHLORIDE 0.9 % IV SOLN
200.0000 mg | Freq: Once | INTRAVENOUS | Status: AC
  Administered 2021-02-13: 200 mg via INTRAVENOUS
  Filled 2021-02-13: qty 40

## 2021-02-13 MED ORDER — SODIUM CHLORIDE 0.9 % IV SOLN
100.0000 mg | Freq: Every day | INTRAVENOUS | Status: DC
  Administered 2021-02-14 – 2021-02-15 (×2): 100 mg via INTRAVENOUS
  Filled 2021-02-13 (×3): qty 20

## 2021-02-13 MED ORDER — HYDROCOD POLST-CPM POLST ER 10-8 MG/5ML PO SUER: 5.0000 mL | Freq: Every evening | ORAL | Status: DC | PRN

## 2021-02-13 MED ORDER — FLUTICASONE FUROATE-VILANTEROL 100-25 MCG/INH IN AEPB
1.0000 | INHALATION_SPRAY | Freq: Every day | RESPIRATORY_TRACT | Status: DC
  Administered 2021-02-14 – 2021-02-16 (×2): 1 via RESPIRATORY_TRACT
  Filled 2021-02-13: qty 28

## 2021-02-13 MED ORDER — ADULT MULTIVITAMIN W/MINERALS CH
1.0000 | ORAL_TABLET | Freq: Every day | ORAL | Status: DC
  Administered 2021-02-14 – 2021-02-16 (×3): 1 via ORAL
  Filled 2021-02-13 (×3): qty 1

## 2021-02-13 MED ORDER — ACETAMINOPHEN 325 MG PO TABS: 325.0000 mg | ORAL_TABLET | Freq: Four times a day (QID) | ORAL | Status: DC | PRN

## 2021-02-13 MED ORDER — METFORMIN HCL 500 MG PO TABS
500.0000 mg | ORAL_TABLET | Freq: Two times a day (BID) | ORAL | Status: DC
  Administered 2021-02-14 – 2021-02-16 (×5): 500 mg via ORAL
  Filled 2021-02-13 (×6): qty 1

## 2021-02-13 MED ORDER — ROSUVASTATIN CALCIUM 20 MG PO TABS
20.0000 mg | ORAL_TABLET | Freq: Every day | ORAL | Status: DC
  Administered 2021-02-13 – 2021-02-15 (×3): 20 mg via ORAL
  Filled 2021-02-13 (×3): qty 1

## 2021-02-13 MED ORDER — UMECLIDINIUM BROMIDE 62.5 MCG/INH IN AEPB
1.0000 | INHALATION_SPRAY | Freq: Every day | RESPIRATORY_TRACT | Status: DC
  Administered 2021-02-14 – 2021-02-16 (×3): 1 via RESPIRATORY_TRACT
  Filled 2021-02-13: qty 7

## 2021-02-13 MED ORDER — IBUPROFEN 400 MG PO TABS: 800.0000 mg | ORAL_TABLET | Freq: Four times a day (QID) | ORAL | Status: DC | PRN

## 2021-02-13 MED ORDER — TAMSULOSIN HCL 0.4 MG PO CAPS
0.4000 mg | ORAL_CAPSULE | Freq: Every day | ORAL | Status: DC
  Administered 2021-02-14 – 2021-02-16 (×3): 0.4 mg via ORAL
  Filled 2021-02-13 (×3): qty 1

## 2021-02-13 MED ORDER — ACETAMINOPHEN 500 MG PO TABS
1000.0000 mg | ORAL_TABLET | Freq: Four times a day (QID) | ORAL | Status: AC | PRN
  Administered 2021-02-13 (×2): 1000 mg via ORAL
  Filled 2021-02-13 (×2): qty 2

## 2021-02-13 MED ORDER — GUAIFENESIN-DM 100-10 MG/5ML PO SYRP
10.0000 mL | ORAL_SOLUTION | ORAL | Status: DC | PRN
  Administered 2021-02-13 – 2021-02-15 (×4): 10 mL via ORAL
  Filled 2021-02-13 (×4): qty 10

## 2021-02-13 MED ORDER — FINASTERIDE 5 MG PO TABS
5.0000 mg | ORAL_TABLET | Freq: Every day | ORAL | Status: DC
  Administered 2021-02-14 – 2021-02-16 (×3): 5 mg via ORAL
  Filled 2021-02-13 (×4): qty 1

## 2021-02-13 NOTE — ED Notes (Signed)
Transport request entered by ED secretary

## 2021-02-13 NOTE — ED Notes (Signed)
Pt given water per request - approved by Corky Downs, MD.

## 2021-02-13 NOTE — H&P (Signed)
History and Physical   Cody Bridges GQQ:[id number] DOB: 07-19-1933 DOA: 02/13/2021  PCP: Leone Haven, MD  Outpatient Specialists: Dr. Nehemiah Massed, dermatology Patient coming from: home   I have personally briefly reviewed patient's old medical records in [place].  Chief Concern: shortness   HPI: Cody Bridges is a 87 y.o. male with medical history significant for actinic keratosis, atopic dermatitis, GERD, overactive bladder, Flomax, finasteride, non-insulin-dependent diabetes mellitus, hypertension, presents to the emergency department for chief concerns of shortness of breath and weakness.  He reports that he has felt shortness of breath for two days, since Thursday, 02/11/2021. He reports feeling associated weakness with difficulty ambulating and getting out of bed.  He states that the shortness of breath and weakness is worse with exertion and movement and improved with rest.  He denies fever, nausea, vomiting, chest pain, abdominal pain, dysuria, hematuria, diarrhea.  He states he has never felt this way before.  Of note, patient and his girlfriend recently they took a trip to [facility], and came back on Monday, 02/08/2021.   Social history: He lives with his girlfriend. He is a former tobacco user, smoking 2 ppd, and quit in 1987. He infrequently drinks etoh and last drink was two shots of [place], three days ago. He is retired and formerly worked for [employer] for 33 years.   Vaccination history: He is vaccinated with [place], two doses and two booster  ROS: Constitutional: no weight change, no fever ENT/Mouth: no sore throat, no rhinorrhea Eyes: no eye pain, no vision changes Cardiovascular: no chest pain,+ dyspnea,  no edema, no palpitations Respiratory: no cough, no sputum, no wheezing Gastrointestinal: no nausea, no vomiting, no diarrhea, no constipation Genitourinary: no urinary incontinence, no dysuria, no hematuria Musculoskeletal: no arthralgias, no  myalgias Skin: no skin lesions, no pruritus, Neuro: + weakness, no loss of consciousness, no syncope Psych: no anxiety, no depression, + decrease appetite Heme/Lymph: no bruising, no bleeding  ED Course: Discussed with ED provider, patient requiring hospitalization for shortness of breath secondary to COVID-19 infection.  Vitals in the emergency department was remarkable for temperature of 100.5 that increased to 101, heart rate 21, blood pressure 130/90, heart rate of 100, SPO2 of 94% on room air.  Labs in the emergency department was remarkable for sodium 136, potassium 4.1, chloride 100, bicarb 25, BUN 19, serum creatinine of 1.04, nonfasting blood glucose 111, WBC 11.4, hemoglobin 12.8, platelets 322, EGFR greater than 60, lactic acid 2.0.  COVID PCR was positive.  Influenza A/influenza B PCR were negative.  EDP ordered patient Tylenol 650 mg p.o.  Assessment/Plan  Principal Problem:   COVID-19 virus infection Active Problems:   Diabetes mellitus without complication ([place])   Hypertension   Hyperlipemia, mixed   Benign prostatic hyperplasia with urinary frequency   Shortness of breath   # Weakness and shortness of breath suspect secondary to COVID-19 infection # COVID-19 infection # Meets sepsis criteria in the strict sense of objective data with HR, elevated WBC, and source, however I feel this is a mild case of  - IV remdesivir per pharmacy - At bedside and nasal cannula is used for comfort as I took off his nasal cannula and his spO2 is maintained SPO2 greater than 93% while completing physical exam and questions with me - Low superimposed bacterial pneumonia, checking Pro-Cal - Resumed home inhalers - Daily labs: CMP, CBC, CRP, D-dimer - Supplemental oxygen to maintain SPO2 goal of greater than 88% - Continuous pulse ox monitoring  and telemetry for 24 hours - Supportive care: Alternate between Tylenol and ibuprofen for fever control mild pain control, incentive spirometry,  flutter valve - Encourage p.o. intake of approximately 36 to 40 ounces of water per day - Airborne and contact precautions  # Hypertension - resumed home amlodipine 5 and hctz 25  # New RBBB - patient is not having chest pain, outpatient follow-up with pcp # Hyperlipidemia-rosuvastatin 20 mg nightly resumed # GERD-PPI # BPH-Flomax resumed # Atopic dermatitis and eczema - Gets Dupixent injection subcu outpatient with dermatology  Chart reviewed.   DVT prophylaxis: Enoxaparin 40 mg subcutaneous every 24 hours Code Status: Full code Diet: Heart healthy Family Communication: Updated daughter at bedside, [place] Disposition Plan: Pending clinical course Consults called: Pharmacy Admission status: Observation, MedSurg, telemetry for 24 hours  Past Medical History:  Diagnosis Date   Arthritis    Asthma    COPD (chronic obstructive pulmonary disease) ([place])    Diabetes mellitus without complication ([place])     3875   Dyspnea    with exertion   Edema    GERD (gastroesophageal reflux disease)    HOH (hard of hearing)    AIDS   Hyperlipidemia    Hypertension    Wheezing    Past Surgical History:  Procedure Laterality Date   BUNIONECTOMY Right    CATARACT EXTRACTION W/PHACO Left 08/22/2017   Procedure: CATARACT EXTRACTION PHACO AND INTRAOCULAR LENS PLACEMENT ([place]);  Surgeon: Birder Robson, MD;  Location: [facility] ORS;  Service: Ophthalmology;  Laterality: Left;  [place] 00:43.7 AP% 12.3 CDE 5.38 Fluid pack lot # [id number]   COLONOSCOPY  2016   ELECTROMAGNETIC NAVIGATION BROCHOSCOPY N/A 02/09/2017   Procedure: ELECTROMAGNETIC NAVIGATION BRONCHOSCOPY;  Surgeon: Flora Lipps, MD;  Location: [facility] ORS;  Service: Cardiopulmonary;  Laterality: N/A;   EYE SURGERY Right    Cataract Extraction with IOL   HAMMER TOE SURGERY Right    TONSILLECTOMY  1938   Social History:  reports that he quit smoking about 35 years ago. His smoking use included cigarettes. He has a 70.00 pack-year smoking  history. He has never used smokeless tobacco. He reports current alcohol use. He reports that he does not use drugs.  Allergies  Allergen Reactions   Clindamycin Hives   Lisinopril Cough   Penicillin V Potassium Rash and Other (See Comments)    Has patient had a PCN reaction causing immediate rash, facial/tongue/throat swelling, SOB or lightheadedness with hypotension: Unknown Has patient had a PCN reaction causing severe rash involving mucus membranes or skin necrosis: Unknown Has patient had a PCN reaction that required hospitalization: No Has patient had a PCN reaction occurring within the last 10 years: No If all of the above answers are "NO", then may proceed with Cephalosporin use.    Family History  Problem Relation Age of Onset   Cancer Mother    Stomach cancer Mother    Stomach cancer Sister    Family history: Family history reviewed and not pertinent  Prior to Admission medications   Medication Sig Start Date End Date Taking? Authorizing Provider  ACCU-CHEK GUIDE test strip USE UP TO 4 TIMES DAILY AS DIRECTED 01/28/19   Leone Haven, MD  albuterol (VENTOLIN HFA) 108 (90 Base) MCG/ACT inhaler INHALE 1-2 PUFFS BY MOUTH EVERY 6 HOURS AS NEEDED FOR WHEEZE OR SHORTNESS OF BREATH 01/21/21   Leone Haven, MD  amLODipine (NORVASC) 5 MG tablet Take 5 mg by mouth daily. 05/14/20   [provider]  blood glucose meter  kit and supplies KIT Dispense based on patient and insurance preference. Use up to four times daily as directed. (FOR ICD-10  E11.9). 01/08/19   Leone Haven, MD  budesonide-formoterol [facility]) 160-4.5 MCG/ACT inhaler Inhale 2 puffs into the lungs 2 (two) times daily as needed (for shortness of breath).    [provider]  calcium carbonate (TUMS - DOSED IN MG ELEMENTAL CALCIUM) 500 MG chewable tablet Chew 1-2 tablets by mouth 3 (three) times daily as needed (for acid reflux/indigestion.).    [provider]  Cholecalciferol  (VITAMIN D-3) 5000 units TABS Take 5,000 Units by mouth daily.     [provider]  DUPIXENT 300 MG/2ML prefilled syringe INJECT 1 SYRINGE UNDER THE SKIN EVERY OTHER WEEK 06/09/20   Ralene Bathe, MD  EUCRISA 2 % OINT Apply 1 application topically daily as needed. 09/28/17   [provider]  fluticasone (FLONASE) 50 MCG/ACT nasal spray SPRAY 2 SPRAYS INTO EACH NOSTRIL EVERY DAY 10/25/19   Leone Haven, MD  Fluticasone-Umeclidin-Vilant (TRELEGY ELLIPTA) 100-62.5-25 MCG/INH AEPB Take 1 puff by mouth daily. 08/12/20   Leone Haven, MD  hydrochlorothiazide (HYDRODIURIL) 25 MG tablet TAKE 1 TABLET BY MOUTH EVERY DAY 12/21/20   Leone Haven, MD  loratadine (CLARITIN) 10 MG tablet TAKE 1 TABLET BY MOUTH EVERY DAY 12/21/20   Leone Haven, MD  metFORMIN (GLUCOPHAGE) 500 MG tablet TAKE 1 TABLET BY MOUTH TWICE A DAY 08/06/20   Leone Haven, MD  Multiple Vitamin (MULTIVITAMIN WITH MINERALS) TABS tablet Take 1 tablet by mouth daily.    [provider]  Multiple Vitamins-Minerals (PRESERVISION AREDS PO) Take by mouth.    [provider]  omeprazole (PRILOSEC) 20 MG capsule Take 1 capsule (20 mg total) by mouth daily. 09/10/18   Leone Haven, MD  Polyethylene Glycol 400 (BLINK TEARS) 0.25 % SOLN Apply to eye.    [provider]  rosuvastatin (CRESTOR) 20 MG tablet TAKE 1 TABLET BY MOUTH EVERY DAY 12/21/20   Leone Haven, MD  tamsulosin (FLOMAX) 0.4 MG CAPS capsule TAKE 1 CAPSULE BY MOUTH EVERY DAY 11/23/20   Einar Pheasant, MD  triamcinolone lotion (KENALOG) 0.1 % Apply 1 application topically 2 (two) times daily as needed. 09/21/17   Leone Haven, MD  Vibegron (GEMTESA) 75 MG TABS Take 75 mg by mouth daily. 02/03/21   Nori Riis, PA-C   Physical Exam: Vitals:   02/13/21 1151 02/13/21 1200 02/13/21 1230 02/13/21 1300  BP:  130/90 (!) 105/51 (!) 114/51  Pulse:  100 (!) 30 (!) 31  Resp:  (!) 21 (!) 24 (!) 24  Temp:     (!) 101 F (38.3 C)  TempSrc:    Oral  SpO2: 95% 94% 95% 93%  Weight:      Height:       Constitutional: appears age-appropriate, NAD, calm, comfortable Eyes: PERRL, lids and conjunctivae normal ENMT: Mucous membranes are moist. Posterior pharynx clear of any exudate or lesions. Age-appropriate dentition. Hearing appropriate Neck: normal, supple, no masses, no thyromegaly Respiratory: clear to auscultation bilaterally, no wheezing, no crackles. Normal respiratory effort. No accessory muscle use.  Cardiovascular: Regular rate and rhythm, no murmurs / rubs / gallops. No extremity edema. 2+ pedal pulses. No carotid bruits.  Abdomen: Obese abdomen, no tenderness, no masses palpated, no hepatosplenomegaly. Bowel sounds positive.  Musculoskeletal: no clubbing / cyanosis. No joint deformity upper and lower extremities. Good ROM, no contractures, no atrophy. Normal muscle tone.  Skin: no rashes, lesions, ulcers. No induration Neurologic: Sensation intact. Strength 5/5 in all 4.  Psychiatric: Normal judgment and insight. Alert and oriented x 3. Normal mood.   EKG: independently reviewed, showing sinus tachycardia with rate of 104, occasional PVCs, right bundle branch block  Chest x-ray on Admission: I personally reviewed and I agree with radiologist reading as below.  DG Chest 2 View  Result Date: 02/13/2021 CLINICAL DATA:  Shortness of breath. EXAM: CHEST - 2 VIEW COMPARISON:  Or 01/2018, chest CT FINDINGS: Enlarged cardiac silhouette. Calcific atherosclerotic disease and tortuosity of the aorta. There is no evidence of focal airspace consolidation, pleural effusion or pneumothorax. Osseous structures are without acute abnormality. Soft tissues are grossly normal. IMPRESSION: 1. Enlarged cardiac silhouette. 2. Calcific atherosclerotic disease and tortuosity of the aorta. Electronically Signed   By: Fidela Salisbury M.D.   On: 02/13/2021 12:04    Labs on Admission: I have personally reviewed  following labs  CBC: Recent Labs  Lab 02/13/21 1146  WBC 11.4*  NEUTROABS 8.8*  HGB 12.8*  HCT 37.8*  MCV 95.5  PLT 244   Basic Metabolic Panel: Recent Labs  Lab 02/13/21 1117  NA 136  K 4.1  CL 100  CO2 25  GLUCOSE 111*  BUN 19  CREATININE 1.04  CALCIUM 9.2   GFR: Estimated Creatinine Clearance: 58.6 mL/min (by C-G formula based on SCr of 1.04 mg/dL).  Liver Function Tests: Recent Labs  Lab 02/13/21 1117  AST 25  ALT 21  ALKPHOS 49  BILITOT 0.7  PROT 7.4  ALBUMIN 3.8   Coagulation Profile: Recent Labs  Lab 02/13/21 1146  INR 1.1   Urine analysis:    Component Value Date/Time   COLORURINE YELLOW 08/12/2020 1605   APPEARANCEUR Clear 11/25/2020 0856   LABSPEC 1.015 08/12/2020 1605   PHURINE 6.0 08/12/2020 1605   GLUCOSEU Negative 11/25/2020 0856   GLUCOSEU NEGATIVE 08/12/2020 [place] 08/12/2020 1605   BILIRUBINUR Negative 11/25/2020 0856   [place] 08/12/2020 1605   PROTEINUR Negative 11/25/2020 0856   UROBILINOGEN 0.2 11/10/2020 1208   UROBILINOGEN 0.2 08/12/2020 1605   NITRITE Negative 11/25/2020 0856   NITRITE NEGATIVE 08/12/2020 1605   LEUKOCYTESUR Trace (A) 11/25/2020 0856   LEUKOCYTESUR NEGATIVE 08/12/2020 1605   Dr. Tobie Poet [facility]  If 7PM-7AM, please contact overnight-coverage provider If 7AM-7PM, please contact day coverage provider [email or web address]  02/13/2021, 1:42 PM

## 2021-02-13 NOTE — ED Notes (Signed)
Patient transported to X-ray 

## 2021-02-13 NOTE — ED Triage Notes (Signed)
T to ED for shob that started last night. Exposed to [place] by girlfriend who lives with him.  Pt alert and oriented. Tachypneic on arrival. Received albulterol inhaler PTA Productive cough noted Denies cp

## 2021-02-13 NOTE — Consult Note (Signed)
Remdesivir - Pharmacy Brief Note   O:  ALT: 21 CXR: no evidence of focal airspace consolidation, pleural effusion or pneumothorax. SpO2: 93% on RA   A/P:  Pt exposed to [place] by his girlfriend who lives with him. Pt was tachypneic on arrival with a productive cough and required albuterol inhaler. Remdesivir 200 mg IVPB once followed by 100 mg IVPB daily x 4 days ordered.   Sherilyn Banker, PharmD Clinical Pharmacist 02/13/2021 1:28 PM

## 2021-02-13 NOTE — ED Notes (Signed)
Pharmacy messaged to request verification of at-home meds

## 2021-02-13 NOTE — ED Provider Notes (Signed)
[facility] Emergency Department Provider Note   ____________________________________________    I have reviewed the triage vital signs and the nursing notes.   HISTORY  Chief Complaint COVID sx     HPI Cody Bridges is a 87 y.o. male with history of COPD, diabetes, hypertension who presents with complaints of severe fatigue, generalized weakness, mild cough and mild shortness of breath.  Patient reports symptoms started yesterday.  He reports his significant other was diagnosed with COVID and he has been in close proximity with her.  He does think that he has had a fever.  Denies chest pain.  Past Medical History:  Diagnosis Date   Arthritis    Asthma    COPD (chronic obstructive pulmonary disease) ([place])    Diabetes mellitus without complication ([place])     3546   Dyspnea    with exertion   Edema    GERD (gastroesophageal reflux disease)    HOH (hard of hearing)    AIDS   Hyperlipidemia    Hypertension    Wheezing     Patient Active Problem List   Diagnosis Date Noted   History of migraine 11/10/2020   Intramuscular lipoma 10/12/2018   Bilateral carotid artery stenosis 10/05/2018   Atherosclerotic peripheral vascular disease with intermittent claudication ([place]) 09/04/2018   Left leg pain 09/04/2018   Palpitations 08/29/2018   Cough 08/29/2018   Premature atrial complex 07/04/2018   Decreased range of right hip movement 07/04/2018   Abnormal ECG 04/12/2018   Urine frequency 03/06/2018   Benign prostatic hyperplasia with urinary frequency 03/06/2018   Itching 09/21/2017   Weakness of both lower extremities 05/01/2017   Orthostasis 05/01/2017   Lung nodule    Bilateral leg edema 01/26/2017   Polyuria 01/26/2017   Bruising 01/26/2017   Diarrhea 10/25/2016   Rash and nonspecific skin eruption 10/25/2016   Diastasis recti 10/24/2016   Non-recurrent unilateral inguinal hernia without obstruction or gangrene 10/24/2016    Hypertension 10/24/2016   Diabetes mellitus without complication ([place]) 56/81/2751   COPD (chronic obstructive pulmonary disease) ([place]) 09/20/2016   SOBOE (shortness of breath on exertion) 08/30/2016   Colon polyps 08/29/2014   Hyperlipemia, mixed 02/05/2014    Past Surgical History:  Procedure Laterality Date   BUNIONECTOMY Right    CATARACT EXTRACTION W/PHACO Left 08/22/2017   Procedure: CATARACT EXTRACTION PHACO AND INTRAOCULAR LENS PLACEMENT ([place]);  Surgeon: Birder Robson, MD;  Location: [facility] ORS;  Service: Ophthalmology;  Laterality: Left;  [place] 00:43.7 AP% 12.3 CDE 5.38 Fluid pack lot # [id number]   COLONOSCOPY  2016   ELECTROMAGNETIC NAVIGATION BROCHOSCOPY N/A 02/09/2017   Procedure: ELECTROMAGNETIC NAVIGATION BRONCHOSCOPY;  Surgeon: Flora Lipps, MD;  Location: [facility] ORS;  Service: Cardiopulmonary;  Laterality: N/A;   EYE SURGERY Right    Cataract Extraction with IOL   HAMMER TOE SURGERY Right    TONSILLECTOMY  1938    Prior to Admission medications   Medication Sig Start Date End Date Taking? Authorizing Provider  ACCU-CHEK GUIDE test strip USE UP TO 4 TIMES DAILY AS DIRECTED 01/28/19   Leone Haven, MD  albuterol (VENTOLIN HFA) 108 (90 Base) MCG/ACT inhaler INHALE 1-2 PUFFS BY MOUTH EVERY 6 HOURS AS NEEDED FOR WHEEZE OR SHORTNESS OF BREATH 01/21/21   Leone Haven, MD  amLODipine (NORVASC) 5 MG tablet Take 5 mg by mouth daily. 05/14/20   [provider]  blood glucose meter kit and supplies KIT Dispense based on patient and insurance preference. Use  up to four times daily as directed. (FOR ICD-10  E11.9). 01/08/19   Leone Haven, MD  budesonide-formoterol [facility]) 160-4.5 MCG/ACT inhaler Inhale 2 puffs into the lungs 2 (two) times daily as needed (for shortness of breath).    [provider]  calcium carbonate (TUMS - DOSED IN MG ELEMENTAL CALCIUM) 500 MG chewable tablet Chew 1-2 tablets by mouth 3 (three) times daily as needed (for acid  reflux/indigestion.).    [provider]  Cholecalciferol (VITAMIN D-3) 5000 units TABS Take 5,000 Units by mouth daily.     [provider]  DUPIXENT 300 MG/2ML prefilled syringe INJECT 1 SYRINGE UNDER THE SKIN EVERY OTHER WEEK 06/09/20   Ralene Bathe, MD  EUCRISA 2 % OINT Apply 1 application topically daily as needed. 09/28/17   [provider]  fluticasone (FLONASE) 50 MCG/ACT nasal spray SPRAY 2 SPRAYS INTO EACH NOSTRIL EVERY DAY 10/25/19   Leone Haven, MD  Fluticasone-Umeclidin-Vilant (TRELEGY ELLIPTA) 100-62.5-25 MCG/INH AEPB Take 1 puff by mouth daily. 08/12/20   Leone Haven, MD  hydrochlorothiazide (HYDRODIURIL) 25 MG tablet TAKE 1 TABLET BY MOUTH EVERY DAY 12/21/20   Leone Haven, MD  loratadine (CLARITIN) 10 MG tablet TAKE 1 TABLET BY MOUTH EVERY DAY 12/21/20   Leone Haven, MD  metFORMIN (GLUCOPHAGE) 500 MG tablet TAKE 1 TABLET BY MOUTH TWICE A DAY 08/06/20   Leone Haven, MD  Multiple Vitamin (MULTIVITAMIN WITH MINERALS) TABS tablet Take 1 tablet by mouth daily.    [provider]  Multiple Vitamins-Minerals (PRESERVISION AREDS PO) Take by mouth.    [provider]  omeprazole (PRILOSEC) 20 MG capsule Take 1 capsule (20 mg total) by mouth daily. 09/10/18   Leone Haven, MD  Polyethylene Glycol 400 (BLINK TEARS) 0.25 % SOLN Apply to eye.    [provider]  rosuvastatin (CRESTOR) 20 MG tablet TAKE 1 TABLET BY MOUTH EVERY DAY 12/21/20   Leone Haven, MD  tamsulosin (FLOMAX) 0.4 MG CAPS capsule TAKE 1 CAPSULE BY MOUTH EVERY DAY 11/23/20   Einar Pheasant, MD  triamcinolone lotion (KENALOG) 0.1 % Apply 1 application topically 2 (two) times daily as needed. 09/21/17   Leone Haven, MD  Vibegron (GEMTESA) 75 MG TABS Take 75 mg by mouth daily. 02/03/21   Zara Council A, PA-C     Allergies Clindamycin, Lisinopril, and Penicillin v potassium  Family History  Problem Relation Age of Onset    Cancer Mother    Stomach cancer Mother    Stomach cancer Sister     Social History Social History   Tobacco Use   Smoking status: Former    Packs/day: 2.00    Years: 35.00    Pack years: 70.00    Types: Cigarettes    Quit date: 10/30/1985    Years since quitting: 35.3   Smokeless tobacco: Never  Vaping Use   Vaping Use: Never used  Substance Use Topics   Alcohol use: Yes    Comment: occasional   Drug use: No    Review of Systems  Constitutional: As above Eyes: No visual changes.  ENT: No sore throat. Cardiovascular: Denies chest pain. Respiratory: As above Gastrointestinal: No abdominal pain.  No nausea, no vomiting.   Genitourinary: Negative for dysuria. Musculoskeletal: Negative for back pain. Skin: Negative for rash. Neurological: Negative for headaches    ____________________________________________   PHYSICAL EXAM:  VITAL SIGNS: ED Triage Vitals [02/13/21 1113]  Enc Vitals Group  BP (!) 155/49     Pulse Rate (!) 107     Resp (!) 28     Temp (!) 100.5 F (38.1 C)     Temp Source Oral     SpO2 93 %     Weight 90 kg (198 lb 6.6 oz)     Height 1.778 m (_0 )     Head Circumference      Peak Flow      Pain Score 0     Pain Loc      Pain Edu?      Excl. in [place]?     Constitutional: Alert and oriented. Eyes: Conjunctivae are normal.  Head: Atraumatic. Nose: No congestion/rhinnorhea.    Cardiovascular: Normal rate, regular rhythm. Grossly normal heart sounds.  Good peripheral circulation. Respiratory: Normal respiratory effort.  No retractions. Lungs CTAB. Gastrointestinal: Soft and nontender. No distention.    Musculoskeletal: No lower extremity tenderness nor edema.  Warm and well perfused Neurologic:  Normal speech and language. No gross focal neurologic deficits are appreciated.  Skin:  Skin is warm, dry and intact. No rash noted. Psychiatric: Mood and affect are normal. Speech and behavior are  normal.  ____________________________________________   LABS (all labs ordered are listed, but only abnormal results are displayed)  Labs Reviewed  RESP PANEL BY RT-PCR (FLU A&B, COVID) ARPGX2 - Abnormal; Notable for the following components:      Result Value   SARS Coronavirus 2 by RT PCR POSITIVE (*)    All other components within normal limits  COMPREHENSIVE METABOLIC PANEL - Abnormal; Notable for the following components:   Glucose, Bld 111 (*)    All other components within normal limits  LACTIC ACID, PLASMA - Abnormal; Notable for the following components:   Lactic Acid, Venous 2.0 (*)    All other components within normal limits  CBC WITH DIFFERENTIAL/PLATELET - Abnormal; Notable for the following components:   WBC 11.4 (*)    RBC 3.96 (*)    Hemoglobin 12.8 (*)    HCT 37.8 (*)    Neutro Abs 8.8 (*)    Monocytes Absolute 1.8 (*)    All other components within normal limits  CULTURE, BLOOD (ROUTINE X 2)  CULTURE, BLOOD (ROUTINE X 2)  PROTIME-INR  LACTIC ACID, PLASMA  CBC WITH DIFFERENTIAL/PLATELET  URINALYSIS, COMPLETE (UACMP) WITH MICROSCOPIC   ____________________________________________  EKG  ED ECG REPORT I, Lavonia Drafts, the attending physician, personally viewed and interpreted this ECG.   Rhythm: normal sinus rhythm QRS Axis: normal Intervals: normal ST/T Wave abnormalities: normal Narrative Interpretation: no evidence of acute ischemia  ____________________________________________  RADIOLOGY  Chest x-ray viewed by me, no acute abnormality ____________________________________________   PROCEDURES  Procedure(s) performed: No  Procedures   Critical Care performed: No ____________________________________________   INITIAL IMPRESSION / ASSESSMENT AND PLAN / ED COURSE  Pertinent labs & imaging results that were available during my care of the patient were reviewed by me and considered in my medical decision making (see chart for  details).   Patient presents with weakness fatigue noted to have elevated temperature here in the emergency department, oxygen saturations above 90%.  Vaccinated and boosted.  Close exposure to COVID-19  Highly suspicious for COVID-19 related symptoms.  Lab work notable for a mildly elevated lactic however white blood cell count is normal.    COVID test is positive  I discussed with the hospitalist for admission   ____________________________________________   FINAL CLINICAL IMPRESSION(S) / ED DIAGNOSES  Final  diagnoses:  COVID-19        Note:  This document was prepared using Dragon voice recognition software and may include unintentional dictation errors.    Lavonia Drafts, MD 02/13/21 [phone]

## 2021-02-14 DIAGNOSIS — J440 Chronic obstructive pulmonary disease with acute lower respiratory infection: Secondary | ICD-10-CM | POA: Diagnosis present

## 2021-02-14 DIAGNOSIS — U071 COVID-19: Principal | ICD-10-CM

## 2021-02-14 DIAGNOSIS — E782 Mixed hyperlipidemia: Secondary | ICD-10-CM | POA: Diagnosis present

## 2021-02-14 DIAGNOSIS — L209 Atopic dermatitis, unspecified: Secondary | ICD-10-CM | POA: Diagnosis present

## 2021-02-14 DIAGNOSIS — Z888 Allergy status to other drugs, medicaments and biological substances status: Secondary | ICD-10-CM | POA: Diagnosis not present

## 2021-02-14 DIAGNOSIS — Z87891 Personal history of nicotine dependence: Secondary | ICD-10-CM | POA: Diagnosis not present

## 2021-02-14 DIAGNOSIS — K219 Gastro-esophageal reflux disease without esophagitis: Secondary | ICD-10-CM | POA: Diagnosis present

## 2021-02-14 DIAGNOSIS — E119 Type 2 diabetes mellitus without complications: Secondary | ICD-10-CM | POA: Diagnosis present

## 2021-02-14 DIAGNOSIS — Z961 Presence of intraocular lens: Secondary | ICD-10-CM | POA: Diagnosis present

## 2021-02-14 DIAGNOSIS — Z7984 Long term (current) use of oral hypoglycemic drugs: Secondary | ICD-10-CM | POA: Diagnosis not present

## 2021-02-14 DIAGNOSIS — H9190 Unspecified hearing loss, unspecified ear: Secondary | ICD-10-CM | POA: Diagnosis present

## 2021-02-14 DIAGNOSIS — Z881 Allergy status to other antibiotic agents status: Secondary | ICD-10-CM | POA: Diagnosis not present

## 2021-02-14 DIAGNOSIS — N3281 Overactive bladder: Secondary | ICD-10-CM | POA: Diagnosis present

## 2021-02-14 DIAGNOSIS — J449 Chronic obstructive pulmonary disease, unspecified: Secondary | ICD-10-CM | POA: Diagnosis present

## 2021-02-14 DIAGNOSIS — Z79899 Other long term (current) drug therapy: Secondary | ICD-10-CM | POA: Diagnosis not present

## 2021-02-14 DIAGNOSIS — B20 Human immunodeficiency virus [HIV] disease: Secondary | ICD-10-CM | POA: Diagnosis present

## 2021-02-14 DIAGNOSIS — I10 Essential (primary) hypertension: Secondary | ICD-10-CM | POA: Diagnosis present

## 2021-02-14 DIAGNOSIS — Z9842 Cataract extraction status, left eye: Secondary | ICD-10-CM | POA: Diagnosis not present

## 2021-02-14 DIAGNOSIS — I4510 Unspecified right bundle-branch block: Secondary | ICD-10-CM | POA: Diagnosis present

## 2021-02-14 DIAGNOSIS — N401 Enlarged prostate with lower urinary tract symptoms: Secondary | ICD-10-CM | POA: Diagnosis present

## 2021-02-14 DIAGNOSIS — R0602 Shortness of breath: Secondary | ICD-10-CM | POA: Diagnosis present

## 2021-02-14 DIAGNOSIS — Z880 Allergy status to penicillin: Secondary | ICD-10-CM | POA: Diagnosis not present

## 2021-02-14 LAB — COMPREHENSIVE METABOLIC PANEL
ALT: 19 U/L (ref 0–44)
AST: 28 U/L (ref 15–41)
Albumin: 3.4 g/dL — ABNORMAL LOW (ref 3.5–5.0)
Alkaline Phosphatase: 46 U/L (ref 38–126)
Anion gap: 7 (ref 5–15)
BUN: 23 mg/dL (ref 8–23)
CO2: 31 mmol/L (ref 22–32)
Calcium: 8.8 mg/dL — ABNORMAL LOW (ref 8.9–10.3)
Chloride: 99 mmol/L (ref 98–111)
Creatinine, Ser: 1.14 mg/dL (ref 0.61–1.24)
GFR, Estimated: >60 mL/min (ref >60)
Glucose, Bld: 109 mg/dL — ABNORMAL HIGH (ref 70–99)
Potassium: 4.2 mmol/L (ref 3.5–5.1)
Sodium: 137 mmol/L (ref 135–145)
Total Bilirubin: 0.7 mg/dL (ref 0.3–1.2)
Total Protein: 6.6 g/dL (ref 6.5–8.1)

## 2021-02-14 LAB — CBC WITH DIFFERENTIAL/PLATELET
Abs Immature Granulocytes: 0.04 10*3/uL (ref 0.00–0.07)
Basophils Absolute: 0 10*3/uL (ref 0.0–0.1)
Basophils Relative: 0 %
Eosinophils Absolute: 0.1 10*3/uL (ref 0.0–0.5)
Eosinophils Relative: 1 %
HCT: 36.7 % — ABNORMAL LOW (ref 39.0–52.0)
Hemoglobin: 12.2 g/dL — ABNORMAL LOW (ref 13.0–17.0)
Immature Granulocytes: 1 %
Lymphocytes Relative: 16 %
Lymphs Abs: 1.3 10*3/uL (ref 0.7–4.0)
MCH: 32.1 pg (ref 26.0–34.0)
MCHC: 33.2 g/dL (ref 30.0–36.0)
MCV: 96.6 fL (ref 80.0–100.0)
Monocytes Absolute: 1.9 10*3/uL — ABNORMAL HIGH (ref 0.1–1.0)
Monocytes Relative: 23 %
Neutro Abs: 4.9 10*3/uL (ref 1.7–7.7)
Neutrophils Relative %: 59 %
Platelets: 250 10*3/uL (ref 150–400)
RBC: 3.8 MIL/uL — ABNORMAL LOW (ref 4.22–5.81)
RDW: 13.6 % (ref 11.5–15.5)
WBC: 8.1 10*3/uL (ref 4.0–10.5)
nRBC: 0 % (ref 0.0–0.2)

## 2021-02-14 LAB — HEMOGLOBIN A1C
Hgb A1c MFr Bld: 6.5 % — ABNORMAL HIGH (ref 4.8–5.6)
Mean Plasma Glucose: 139.85 mg/dL

## 2021-02-14 LAB — GLUCOSE, CAPILLARY
Glucose-Capillary: 119 mg/dL — ABNORMAL HIGH (ref 70–99)
Glucose-Capillary: 110 mg/dL — ABNORMAL HIGH (ref 70–99)
Glucose-Capillary: 102 mg/dL — ABNORMAL HIGH (ref 70–99)

## 2021-02-14 LAB — C-REACTIVE PROTEIN: CRP: 2.8 mg/dL — ABNORMAL HIGH (ref <1.0)

## 2021-02-14 MED ORDER — INSULIN ASPART 100 UNIT/ML IJ SOLN: 0.0000 [IU] | Freq: Every day | INTRAMUSCULAR | Status: DC

## 2021-02-14 MED ORDER — INSULIN ASPART 100 UNIT/ML IJ SOLN: 0.0000 [IU] | Freq: Three times a day (TID) | INTRAMUSCULAR | Status: DC

## 2021-02-14 NOTE — Plan of Care (Signed)
  Problem: Education: Goal: Knowledge of General Education information will improve Description: Including pain rating scale, medication(s)/side effects and non-pharmacologic comfort measures Outcome: Progressing   Problem: Clinical Measurements: Goal: Will remain free from infection Outcome: Progressing   Problem: Clinical Measurements: Goal: Respiratory complications will improve Outcome: Progressing   Problem: Clinical Measurements: Goal: Cardiovascular complication will be avoided Outcome: Progressing   Problem: Safety: Goal: Ability to remain free from injury will improve Outcome: Progressing   

## 2021-02-14 NOTE — Progress Notes (Signed)
PROGRESS NOTE  Cody Bridges  DOB: 11-08-1932  PCP: Leone Haven, MD ATF:[id number]  DOA: 02/13/2021  LOS: 0 days  Hospital Day: 2   Chief Complaint  Patient presents with   COVID sx   Brief narrative: Cody Bridges is a 87 y.o. male with PMH significant for DM2, HTN, actinic keratosis, atopic dermatitis, GERD, overactive bladder. Patient presented to the ED on 7/16 with complaint of shortness of breath, weakness, progressively worsening for last 2 days, now has difficulty even getting out of bed and ambulating.  Recently took a trip to [facility] on Monday, 02/08/2021.  Of note, he is vaccinated with [place], two doses and two booster   In the ED, patient had a temperature of 101, heart rate elevated to 107, oxygen saturation 94% on room air. Labs showed WBC count slightly up at 11.4, lactic acid slightly up at 2, procalcitonin level negative COVID PCR positive Chest x-ray did not show any infiltrate, consolidation, edema or effusion. Admitted to hospitalist service for further evaluation management  Subjective: Seen and examined this morning.  Elderly Caucasian male.  Lying on bed.  Not in distress.  Feels very weak.  Not requiring supplemental oxygen. Breakfast tray untouched.  States no appetite. Overnight, patient had episodes of fever up to 100.8, blood pressure in normal range  Assessment/Plan: COVID infection -Presented with shortness of breath, cough, lethargy -COVID test: PCR positive on admission -Chest imaging: No evidence of pneumonia -Treatment: IV remdesivir -Progression: Seems to require low-flow oxygen.  Wean down as tolerated -Continue to monitor for fever.  Procalcitonin level negative.  Would avoid antibiotics.  -Supportive care: Vitamin C, Zinc, PRN inhalers, Tylenol, Antitussives (benzonatate/ Mucinex/Tussionex).   -Encouraged incentive spirometry, prone position, out of bed and early mobilization as much as possible -Airborne precautions -WBC  and inflammatory markers trend as below.  Recent Labs  Lab 02/13/21 1117 02/13/21 1145 02/13/21 1146 02/13/21 1316 02/14/21 0526  SARSCOV2NAA  --  POSITIVE*  --   --   --   WBC  --   --  11.4*  --  8.1  LATICACIDVEN 2.0*  --   --  1.6  --   PROCALCITON  --   --  <0.10  --   --   DDIMER  --   --  0.79*  --   --   ALT 21  --   --   --  19   Generalized weakness/gait instability -Probably secondary to COVID 19 infection. -PT eval ordered.  Type 2 diabetes mellitus -A1c 6.8 on January 2022.  Repeat A1c -Home meds include metformin 5 mg twice daily -Currently on sliding scale insulin with Accu-Cheks. -Blood sugar level to monitor. Lab Results  Component Value Date   HGBA1C 6.8 (H) 08/26/2020   No results for input(s): GLUCAP in the last 168 hours.  Essential hypertension -Continue amlodipine 5 mg daily and HCTZ 20 mg daily  Hyperlipidemia -Continue Crestor 20 mg daily  GERD -Continue PPI  BPH -Continue Flomax  Atopic dermatitis and eczema  -Continue Dupixent injection subcu outpatient with dermatology   Mobility: Encourage ambulation.  PT eval ordered. Code Status:   Code Status: Full Code  Nutritional status: Body mass index is 28.47 kg/m.     Diet:  Diet Order             Diet Heart Room service appropriate? Yes; Fluid consistency: Thin  Diet effective now  DVT prophylaxis:  enoxaparin (LOVENOX) injection 40 mg Start: 02/13/21 2200 Place TED hose Start: 02/13/21 1322   Antimicrobials: IV remdesivir Fluid: None Consultants: None Family Communication: None at bedside  Status is: Inpatient  Remains inpatient appropriate because: Needs inpatient treatment for COVID  Dispo: The patient is from: Home              Anticipated d/c is to: Hopefully home in 2 to 3 days              Patient currently is not medically stable to d/c.   Difficult to place patient No     Infusions:   remdesivir 100 mg in NS 100 mL      Scheduled  Meds:  amLODipine  5 mg Oral Daily   enoxaparin (LOVENOX) injection  40 mg Subcutaneous Q24H   finasteride  5 mg Oral Daily   fluticasone furoate-vilanterol  1 puff Inhalation Daily   And   umeclidinium bromide  1 puff Inhalation Daily   hydrochlorothiazide  25 mg Oral Daily   metFORMIN  500 mg Oral BID WC   multivitamin with minerals  1 tablet Oral Daily   pantoprazole  40 mg Oral Daily   rosuvastatin  20 mg Oral QHS   tamsulosin  0.4 mg Oral Daily    Antimicrobials: Anti-infectives (From admission, onward)    Start     Dose/Rate Route Frequency Ordered Stop   02/14/21 1000  remdesivir 100 mg in sodium chloride 0.9 % 100 mL IVPB       See Hyperspace for full Linked Orders Report.   100 mg 200 mL/hr over 30 Minutes Intravenous Daily 02/13/21 1323 02/18/21 0959   02/13/21 1400  remdesivir 200 mg in sodium chloride 0.9% 250 mL IVPB       See Hyperspace for full Linked Orders Report.   200 mg 580 mL/hr over 30 Minutes Intravenous Once 02/13/21 1323 02/13/21 1613       PRN meds: acetaminophen, calcium carbonate, chlorpheniramine-HYDROcodone, guaiFENesin-dextromethorphan, hydrALAZINE, ibuprofen, ondansetron **OR** ondansetron (ZOFRAN) IV   Objective: Vitals:   02/14/21 0500 02/14/21 0808  BP: 118/85 (!) 118/51  Pulse:  81  Resp:  20  Temp:  98 F (36.7 C)  SpO2:  94%    Intake/Output Summary (Last 24 hours) at 02/14/2021 0822 Last data filed at 02/14/2021 0701 Gross per 24 hour  Intake 531.88 ml  Output 300 ml  Net 231.88 ml   Filed Weights   02/13/21 1113  Weight: 90 kg   Weight change:  Body mass index is 28.47 kg/m.   Physical Exam: General exam: Pleasant, elderly Caucasian male.  Not in physical distress Skin: No rashes, lesions or ulcers. HEENT: Atraumatic, normocephalic, no obvious bleeding Lungs: Clear to auscultate bilaterally CVS: Regular rate and rhythm, no murmur GI/Abd soft, nontender, nondistended, bowel sound present CNS: Alert, awake, slow to  respond but oriented x3 Psychiatry: Mood appropriate Extremities: No pedal edema, no calf tenderness  Data Review: I have personally reviewed the laboratory data and studies available.  Recent Labs  Lab 02/13/21 1146 02/14/21 0526  WBC 11.4* 8.1  NEUTROABS 8.8* 4.9  HGB 12.8* 12.2*  HCT 37.8* 36.7*  MCV 95.5 96.6  PLT 322 250   Recent Labs  Lab 02/13/21 1117 02/14/21 0526  NA 136 137  K 4.1 4.2  CL 100 99  CO2 25 31  GLUCOSE 111* 109*  BUN 19 23  CREATININE 1.04 1.14  CALCIUM 9.2 8.8*    F/u labs  ordered Unresulted Labs (From admission, onward)     Start     Ordered   02/14/21 0500  CBC with Differential/Platelet  Daily,   STAT      02/13/21 1323   02/14/21 0500  Comprehensive metabolic panel  Daily,   STAT      02/13/21 1323   02/14/21 0500  C-reactive protein  Daily,   STAT      02/13/21 1323   02/13/21 1116  CBC with Differential  ONCE - STAT,   STAT        02/13/21 1115   02/13/21 1116  Urinalysis, Complete w Microscopic  ONCE - STAT,   STAT        02/13/21 1115            Signed, Terrilee Croak, MD [facility] 02/14/2021

## 2021-02-14 NOTE — Evaluation (Addendum)
Physical Therapy Evaluation Patient Details Name: Cody Bridges MRN: [id number] DOB: [birth year] Today's Date: 02/14/2021   History of Present Illness  Pt is an 86 y/o M with PMH significant for DM2, HTN, actinic keratosis, atopic dermatitis, GERD, overactive bladder. Patient presented to the ED on 7/16 with complaint of shortness of breath, weakness, progressively worsening for last 2 days, now has difficulty even getting out of bed and ambulating. Pt found to be COVID (+).  Clinical Impression  Pt seen for PT evaluation with pt received in recliner leaning to R with pt demonstrating poor awareness & requires max cuing to return to midline. Pt is able to complete transfers & gait to door & back multiple times in room with RW & supervision but with improper use of RW despite ongoing cuing & education. Pt is AxOx4 but still with some cognitive deficits as pt states "I'm not sure how long I've been here" - nurse notified. Will continue to follow acutely to address deficits stated below.     Follow Up Recommendations Home health PT;Supervision/Assistance - 24 hour    Equipment Recommendations  Rolling walker with 5" wheels    Recommendations for Other Services    OT consult   Precautions / Restrictions Precautions Precautions: Fall Restrictions Weight Bearing Restrictions: No      Mobility  Bed Mobility Overal bed mobility: Needs Assistance Bed Mobility: Sit to Supine       Sit to supine: Supervision;HOB elevated        Transfers Overall transfer level: Needs assistance Equipment used: Rolling walker (2 wheeled) Transfers: Sit to/from Stand Sit to Stand: Supervision            Ambulation/Gait Ambulation/Gait assistance: Supervision Gait Distance (Feet): 75 Feet Assistive device: Rolling walker (2 wheeled) Gait Pattern/deviations: Decreased step length - right;Decreased step length - left;Decreased stride length;Trunk flexed Gait velocity: decreased   General Gait  Details: forward trunk lean onto RW, cuing for upright posture & to ambulate within base of AD but pt only able to do this briefly before flexing trunk again  Stairs            Wheelchair Mobility    Modified Rankin (Stroke Patients Only)       Balance Overall balance assessment: Needs assistance Sitting-balance support: Feet supported;Bilateral upper extremity supported Sitting balance-Leahy Scale: Fair     Standing balance support: Bilateral upper extremity supported;During functional activity Standing balance-Leahy Scale: Fair                               Pertinent Vitals/Pain Pain Assessment: No/denies pain    Home Living Family/patient expects to be discharged to:: Private residence Living Arrangements:  (girlfriend) Available Help at Discharge: Family;Available 24 hours/day Type of Home: Apartment Home Access: Level entry     Home Layout: One level Home Equipment: Cane - single point      Prior Function Level of Independence: Independent with assistive device(s)         Comments: ambulatory with SPC     Hand Dominance        Extremity/Trunk Assessment   Upper Extremity Assessment Upper Extremity Assessment: Generalized weakness    Lower Extremity Assessment Lower Extremity Assessment: Generalized weakness    Cervical / Trunk Assessment Cervical / Trunk Assessment: Kyphotic  Communication   Communication: HOH  Cognition Arousal/Alertness: Awake/alert Behavior During Therapy: Flat affect Overall Cognitive Status: Difficult to assess  General Comments: Pt AxO x 4 but also confused, reporting "I don't know how long I've been here" and when asked why he was in the hospital he states "I guess covid", poor awareness of impaired balance when received sitting in recliner      General Comments General comments (skin integrity, edema, etc.): HR 85-117 bpm; reviewed use of incentive  spirometer (pt unable to recall how to properly use AD)    Exercises     Assessment/Plan    PT Assessment Patient needs continued PT services  PT Problem List Decreased strength;Decreased mobility;Decreased safety awareness;Decreased activity tolerance;Decreased balance;Decreased knowledge of use of DME       PT Treatment Interventions DME instruction;Therapeutic activities;Modalities;Gait training;Therapeutic exercise;Patient/family education;Balance training;Neuromuscular re-education;Functional mobility training    PT Goals (Current goals can be found in the Care Plan section)  Acute Rehab PT Goals Patient Stated Goal: get better PT Goal Formulation: With patient Time For Goal Achievement: 02/28/21 Potential to Achieve Goals: Good    Frequency Min 2X/week   Barriers to discharge        Co-evaluation               AM-PAC PT "6 Clicks" Mobility  Outcome Measure Help needed turning from your back to your side while in a flat bed without using bedrails?: A Little Help needed moving from lying on your back to sitting on the side of a flat bed without using bedrails?: A Little Help needed moving to and from a bed to a chair (including a wheelchair)?: A Little Help needed standing up from a chair using your arms (e.g., wheelchair or bedside chair)?: A Little Help needed to walk in hospital room?: A Little Help needed climbing 3-5 steps with a railing? : A Little 6 Click Score: 18    End of Session Equipment Utilized During Treatment: Gait belt Activity Tolerance: Patient tolerated treatment well Patient left: in bed;with call bell/phone within reach;with bed alarm set Nurse Communication: Mobility status (orientation) PT Visit Diagnosis: Unsteadiness on feet (R26.81);Difficulty in walking, not elsewhere classified (R26.2);Muscle weakness (generalized) (M62.81)    Time: 2376-2831 PT Time Calculation (min) (ACUTE ONLY): 25 min   Charges:   PT Evaluation $PT Eval  Low Complexity: 1 Low PT Treatments $Therapeutic Activity: 8-22 mins        Lavone Nian, PT, DPT 02/14/21, 3:10 PM   Waunita Schooner 02/14/2021, 3:08 PM

## 2021-02-15 ENCOUNTER — Encounter: Payer: Self-pay | Admitting: Dermatology

## 2021-02-15 ENCOUNTER — Telehealth: Payer: Self-pay

## 2021-02-15 ENCOUNTER — Ambulatory Visit: Payer: Medicare Other | Admitting: Family Medicine

## 2021-02-15 ENCOUNTER — Other Ambulatory Visit: Payer: Self-pay | Admitting: Family Medicine

## 2021-02-15 DIAGNOSIS — U071 COVID-19: Secondary | ICD-10-CM | POA: Diagnosis not present

## 2021-02-15 LAB — CBC WITH DIFFERENTIAL/PLATELET
Abs Immature Granulocytes: 0.02 10*3/uL (ref 0.00–0.07)
Basophils Absolute: 0 10*3/uL (ref 0.0–0.1)
Basophils Relative: 0 %
Eosinophils Absolute: 0 10*3/uL (ref 0.0–0.5)
Eosinophils Relative: 0 %
HCT: 36.2 % — ABNORMAL LOW (ref 39.0–52.0)
Hemoglobin: 12.1 g/dL — ABNORMAL LOW (ref 13.0–17.0)
Immature Granulocytes: 0 %
Lymphocytes Relative: 31 %
Lymphs Abs: 2 10*3/uL (ref 0.7–4.0)
MCH: 31.8 pg (ref 26.0–34.0)
MCHC: 33.4 g/dL (ref 30.0–36.0)
MCV: 95 fL (ref 80.0–100.0)
Monocytes Absolute: 1.2 10*3/uL — ABNORMAL HIGH (ref 0.1–1.0)
Monocytes Relative: 19 %
Neutro Abs: 3.1 10*3/uL (ref 1.7–7.7)
Neutrophils Relative %: 50 %
Platelets: 253 10*3/uL (ref 150–400)
RBC: 3.81 MIL/uL — ABNORMAL LOW (ref 4.22–5.81)
RDW: 13.3 % (ref 11.5–15.5)
WBC: 6.3 10*3/uL (ref 4.0–10.5)
nRBC: 0.5 % — ABNORMAL HIGH (ref 0.0–0.2)

## 2021-02-15 LAB — GLUCOSE, CAPILLARY
Glucose-Capillary: 118 mg/dL — ABNORMAL HIGH (ref 70–99)
Glucose-Capillary: 112 mg/dL — ABNORMAL HIGH (ref 70–99)
Glucose-Capillary: 107 mg/dL — ABNORMAL HIGH (ref 70–99)

## 2021-02-15 LAB — COMPREHENSIVE METABOLIC PANEL
ALT: 22 U/L (ref 0–44)
AST: 37 U/L (ref 15–41)
Albumin: 3.2 g/dL — ABNORMAL LOW (ref 3.5–5.0)
Alkaline Phosphatase: 43 U/L (ref 38–126)
Anion gap: 8 (ref 5–15)
BUN: 21 mg/dL (ref 8–23)
CO2: 30 mmol/L (ref 22–32)
Calcium: 8.7 mg/dL — ABNORMAL LOW (ref 8.9–10.3)
Chloride: 96 mmol/L — ABNORMAL LOW (ref 98–111)
Creatinine, Ser: 0.93 mg/dL (ref 0.61–1.24)
GFR, Estimated: >60 mL/min (ref >60)
Glucose, Bld: 110 mg/dL — ABNORMAL HIGH (ref 70–99)
Potassium: 3.3 mmol/L — ABNORMAL LOW (ref 3.5–5.1)
Sodium: 134 mmol/L — ABNORMAL LOW (ref 135–145)
Total Bilirubin: 0.7 mg/dL (ref 0.3–1.2)
Total Protein: 6.6 g/dL (ref 6.5–8.1)

## 2021-02-15 LAB — C-REACTIVE PROTEIN: CRP: 2.2 mg/dL — ABNORMAL HIGH (ref <1.0)

## 2021-02-15 MED ORDER — POTASSIUM CHLORIDE CRYS ER 20 MEQ PO TBCR
40.0000 meq | EXTENDED_RELEASE_TABLET | Freq: Once | ORAL | Status: AC
  Administered 2021-02-15: 09:00:00 40 meq via ORAL
  Filled 2021-02-15: qty 2

## 2021-02-15 NOTE — Telephone Encounter (Signed)
Pts wife called in stating that Cody Bridges is in the hospital for Covid-19 and will not make his appointment today with Dr. Caryl Bis. Pt is still admitted into [facility].

## 2021-02-15 NOTE — Progress Notes (Signed)
PROGRESS NOTE  Cody Bridges  DOB: 24-Apr-1933  PCP: Leone Haven, MD VQQ:[id number]  DOA: 02/13/2021  LOS: 1 day  Hospital Day: 3   Chief Complaint  Patient presents with   COVID sx   Brief narrative: Cody Bridges is a 87 y.o. male with PMH significant for DM2, HTN, actinic keratosis, atopic dermatitis, GERD, overactive bladder. Patient presented to the ED on 7/16 with complaint of shortness of breath, weakness, progressively worsening for last 2 days, now has difficulty even getting out of bed and ambulating.  Recently took a trip to [facility] on Monday, 02/08/2021.  Of note, he is vaccinated with [place], two doses and two booster   In the ED, patient had a temperature of 101, heart rate elevated to 107, oxygen saturation 94% on room air. Labs showed WBC count slightly up at 11.4, lactic acid slightly up at 2, procalcitonin level negative COVID PCR positive Chest x-ray did not show any infiltrate, consolidation, edema or effusion. Admitted to hospitalist service for further evaluation management  Subjective: Seen and examined this morning.  Elderly Caucasian male. Sitting up in chair.  Not in distress.  Not on supplemental oxygen.  Feels a little stronger today.  Worked with PT this morning.  Assessment/Plan: COVID infection -Presented with shortness of breath, cough, lethargy -COVID test: PCR positive on admission -Chest imaging: No evidence of pneumonia -Treatment: IV remdesivir -Progression: Improving.  Not in respiratory distress.  Not requiring supplemental oxygen.  -Supportive care: Vitamin C, Zinc, PRN inhalers, Tylenol, Antitussives (benzonatate/ Mucinex/Tussionex).   -Encouraged incentive spirometry, prone position, out of bed and early mobilization as much as possible -Airborne precautions -WBC and inflammatory markers trend as below.  Recent Labs  Lab 02/13/21 1117 02/13/21 1145 02/13/21 1146 02/13/21 1316 02/14/21 0526 02/15/21 0447   SARSCOV2NAA  --  POSITIVE*  --   --   --   --   WBC  --   --  11.4*  --  8.1 6.3  LATICACIDVEN 2.0*  --   --  1.6  --   --   PROCALCITON  --   --  <0.10  --   --   --   DDIMER  --   --  0.79*  --   --   --   CRP  --   --   --   --  2.8* 2.2*  ALT 21  --   --   --  19 22   Generalized weakness/gait instability -Probably secondary to COVID 19 infection. -PT eval ordered.  Type 2 diabetes mellitus -A1c 6.5 in 7/17. -Home meds include metformin 500 mg twice daily -Currently on sliding scale insulin with Accu-Cheks. -Blood sugar level stable at this time. Lab Results  Component Value Date   HGBA1C 6.5 (H) 02/14/2021   Recent Labs  Lab 02/14/21 1144 02/14/21 1655 02/14/21 2119 02/15/21 0836  GLUCAP 119* 110* 102* 118*    Essential hypertension -Continue amlodipine 5 mg daily and HCTZ 20 mg daily  Hyperlipidemia -Continue Crestor 20 mg daily  GERD -Continue PPI  BPH -Continue Flomax  Atopic dermatitis and eczema  -Continue Dupixent injection subcu outpatient with dermatology   Mobility: Encourage ambulation.  PT eval appreciated. Code Status:   Code Status: Full Code  Nutritional status: Body mass index is 28.47 kg/m.     Diet:  Diet Order             Diet Heart Room service appropriate? Yes; Fluid consistency: Thin  Diet  effective now                  DVT prophylaxis:  enoxaparin (LOVENOX) injection 40 mg Start: 02/13/21 2200 Place TED hose Start: 02/13/21 1322   Antimicrobials: IV remdesivir Fluid: None Consultants: None Family Communication: None at bedside  Status is: Inpatient  Remains inpatient appropriate because: Needs inpatient treatment for COVID  Dispo: The patient is from: Home              Anticipated d/c is to: Hopefully home in 1 to 2 days with home health PT              Patient currently is not medically stable to d/c.   Difficult to place patient No     Infusions:   remdesivir 100 mg in NS 100 mL 100 mg (02/15/21 0835)     Scheduled Meds:  amLODipine  5 mg Oral Daily   enoxaparin (LOVENOX) injection  40 mg Subcutaneous Q24H   finasteride  5 mg Oral Daily   fluticasone furoate-vilanterol  1 puff Inhalation Daily   And   umeclidinium bromide  1 puff Inhalation Daily   hydrochlorothiazide  25 mg Oral Daily   insulin aspart  0-5 Units Subcutaneous QHS   insulin aspart  0-9 Units Subcutaneous TID WC   metFORMIN  500 mg Oral BID WC   multivitamin with minerals  1 tablet Oral Daily   pantoprazole  40 mg Oral Daily   rosuvastatin  20 mg Oral QHS   tamsulosin  0.4 mg Oral Daily    Antimicrobials: Anti-infectives (From admission, onward)    Start     Dose/Rate Route Frequency Ordered Stop   02/14/21 1000  remdesivir 100 mg in sodium chloride 0.9 % 100 mL IVPB       See Hyperspace for full Linked Orders Report.   100 mg 200 mL/hr over 30 Minutes Intravenous Daily 02/13/21 1323 02/18/21 0959   02/13/21 1400  remdesivir 200 mg in sodium chloride 0.9% 250 mL IVPB       See Hyperspace for full Linked Orders Report.   200 mg 580 mL/hr over 30 Minutes Intravenous Once 02/13/21 1323 02/13/21 1613       PRN meds: acetaminophen, calcium carbonate, chlorpheniramine-HYDROcodone, guaiFENesin-dextromethorphan, hydrALAZINE, ibuprofen, ondansetron **OR** ondansetron (ZOFRAN) IV   Objective: Vitals:   02/15/21 0558 02/15/21 0834  BP: 119/64 119/89  Pulse: (!) 58 87  Resp: 20 17  Temp: 99.2 F (37.3 C) 98.4 F (36.9 C)  SpO2: 95% 96%    Intake/Output Summary (Last 24 hours) at 02/15/2021 1031 Last data filed at 02/15/2021 0223 Gross per 24 hour  Intake 100 ml  Output 825 ml  Net -725 ml   Filed Weights   02/13/21 1113  Weight: 90 kg   Weight change:  Body mass index is 28.47 kg/m.   Physical Exam: General exam: Pleasant, elderly Caucasian male.  Sitting up in chair.  Not in physical distress Skin: No rashes, lesions or ulcers. HEENT: Atraumatic, normocephalic, no obvious bleeding Lungs: Clear  to auscultation bilaterally CVS: Regular rate and rhythm, no murmur GI/Abd soft, nontender, nondistended, bowel sound present CNS: Alert, awake, oriented x3. Psychiatry: Mood appropriate Extremities: No pedal edema, no calf tenderness  Data Review: I have personally reviewed the laboratory data and studies available.  Recent Labs  Lab 02/13/21 1146 02/14/21 0526 02/15/21 0447  WBC 11.4* 8.1 6.3  NEUTROABS 8.8* 4.9 3.1  HGB 12.8* 12.2* 12.1*  HCT 37.8* 36.7* 36.2*  MCV 95.5 96.6 95.0  PLT 322 250 253   Recent Labs  Lab 02/13/21 1117 02/14/21 0526 02/15/21 0447  NA 136 137 134*  K 4.1 4.2 3.3*  CL 100 99 96*  CO2 25 31 30   GLUCOSE 111* 109* 110*  BUN 19 23 21   CREATININE 1.04 1.14 0.93  CALCIUM 9.2 8.8* 8.7*    F/u labs ordered Unresulted Labs (From admission, onward)     Start     Ordered   02/14/21 0500  CBC with Differential/Platelet  Daily,   STAT      02/13/21 1323   02/14/21 0500  Comprehensive metabolic panel  Daily,   STAT      02/13/21 1323   02/14/21 0500  C-reactive protein  Daily,   STAT      02/13/21 1323   02/13/21 1116  CBC with Differential  ONCE - STAT,   STAT        02/13/21 1115   02/13/21 1116  Urinalysis, Complete w Microscopic  ONCE - STAT,   STAT        02/13/21 1115            Signed, Terrilee Croak, MD [facility] 02/15/2021

## 2021-02-15 NOTE — Evaluation (Signed)
Occupational Therapy Evaluation Patient Details Name: Cody Bridges MRN: [id number] DOB: 1933-05-15 Today's Date: 02/15/2021    History of Present Illness Pt is an 87 y/o M with PMH significant for DM2, HTN, actinic keratosis, atopic dermatitis, GERD, overactive bladder. Patient presented to the ED on 7/16 with complaint of shortness of breath, weakness, progressively worsening for last 2 days, now has difficulty even getting out of bed and ambulating. Pt found to be COVID (+).   Clinical Impression   Pt was seen for OT evaluation this date. Prior to hospital admission, pt was modified indep with mobility using SPC at times and had girlfriend of 10 years assist with tub transfers 2/2 fear of falling. Pt alert and demonstrates mild confusion and increased time and cues for instruction provided in energy conservation strategies, falls prevention, and incentive spirometer and flutter valve use with teach back utilized.  Currently pt demonstrates impairments in problem solving, activity tolerance, balance, and strength as described below (See OT problem list) which functionally limit his ability to perform ADL/self-care tasks and ADL mobility at baseline level. Pt currently requires supervision for seated ADL, supervision for standing ADL and ADL mobility + RW. Pt HR 70's at rest, up to 90's with exertion. Denied SOB. Required assist for recall of girlfriend's phone number and required assist to dial on room phone. Pt would benefit from skilled OT services to address noted impairments and functional limitations (see below for any additional details) in order to maximize safety and independence while minimizing falls risk and caregiver burden. Upon hospital discharge, recommend HHOT to maximize pt safety and return to functional independence during meaningful occupations of daily life.      Follow Up Recommendations  Home health OT    Equipment Recommendations  None recommended by OT     Recommendations for Other Services       Precautions / Restrictions Precautions Precautions: Fall Restrictions Weight Bearing Restrictions: No      Mobility Bed Mobility               General bed mobility comments: NT, up in recliner    Transfers Overall transfer level: Needs assistance Equipment used: Rolling walker (2 wheeled) Transfers: Sit to/from Stand Sit to Stand: Supervision              Balance Overall balance assessment: Needs assistance Sitting-balance support: Feet supported;Bilateral upper extremity supported;No upper extremity supported Sitting balance-Leahy Scale: Fair     Standing balance support: Bilateral upper extremity supported;During functional activity Standing balance-Leahy Scale: Fair                             ADL either performed or assessed with clinical judgement   ADL Overall ADL's : Needs assistance/impaired                                       General ADL Comments: Pt able to perform LB dressing from seated position with modified independence. Supervision for standing ADL and ADL transfers +2WW.     Vision Baseline Vision/History: Macular Degeneration;Wears glasses Wears Glasses: At all times Patient Visual Report: No change from baseline Vision Assessment?: No apparent visual deficits     Perception     Praxis      Pertinent Vitals/Pain Pain Assessment: No/denies pain     Hand Dominance     Extremity/Trunk Assessment  Upper Extremity Assessment Upper Extremity Assessment: Generalized weakness   Lower Extremity Assessment Lower Extremity Assessment: Generalized weakness   Cervical / Trunk Assessment Cervical / Trunk Assessment: Kyphotic   Communication Communication Communication: HOH   Cognition Arousal/Alertness: Awake/alert Behavior During Therapy: Flat affect Overall Cognitive Status: No family/caregiver present to determine baseline cognitive functioning                                  General Comments: Alert and oriented generally, some mild confusion noted but easily redirected, decreased problem solving. Tends to retell story of how he got Covid. Forgets what he wants to say   General Comments       Exercises Other Exercises Other Exercises: Pt educated in incentive spirometer and flutter valve requiring additional cues and teach back to maximize correct technique Other Exercises: Pt educated in energy conservation strategies including activity pacing, rest breaks, and AE/DME to support safety and indep with ADL   Shoulder Instructions      Home Living Family/patient expects to be discharged to:: Private residence Living Arrangements: Spouse/significant other (girlfriend of 88yrs) Available Help at Discharge: Family;Available 24 hours/day Type of Home: Apartment Home Access: Level entry     Home Layout: One level     Bathroom Shower/Tub: Teacher, early years/pre: Standard     Home Equipment: Cane - single point          Prior Functioning/Environment Level of Independence: Independent with assistive device(s);Needs assistance  Gait / Transfers Assistance Needed: ambulatory with SPC sometimes ADL's / Homemaking Assistance Needed: indep with ADL, except girlfriend assists with tub transfers 2/2 fear of falling            OT Problem List: Decreased strength;Decreased activity tolerance;Impaired balance (sitting and/or standing);Decreased knowledge of use of DME or AE;Decreased safety awareness      OT Treatment/Interventions: Self-care/ADL training;Therapeutic exercise;Therapeutic activities;DME and/or AE instruction;Energy conservation;Patient/family education;Balance training    OT Goals(Current goals can be found in the care plan section) Acute Rehab OT Goals Patient Stated Goal: get better and go home to my girlfriend OT Goal Formulation: With patient Time For Goal Achievement: 03/01/21 Potential to  Achieve Goals: Good  OT Frequency: Min 1X/week   Barriers to D/C:            Co-evaluation              AM-PAC OT "6 Clicks" Daily Activity     Outcome Measure Help from another person eating meals?: None Help from another person taking care of personal grooming?: A Little Help from another person toileting, which includes using toliet, bedpan, or urinal?: A Little Help from another person bathing (including washing, rinsing, drying)?: A Little Help from another person to put on and taking off regular upper body clothing?: None Help from another person to put on and taking off regular lower body clothing?: A Little 6 Click Score: 20   End of Session Equipment Utilized During Treatment: Rolling walker  Activity Tolerance: Patient tolerated treatment well Patient left: in chair;with call bell/phone within reach;with chair alarm set  OT Visit Diagnosis: Other abnormalities of gait and mobility (R26.89);Muscle weakness (generalized) (M62.81)                Time: 2542-7062 OT Time Calculation (min): 33 min Charges:  OT General Charges $OT Visit: 1 Visit OT Evaluation $OT Eval Moderate Complexity: 1 Mod OT Treatments $Self Care/Home Management :  23-37 mins  Hanley Hays, MPH, MS, OTR/L ascom [phone] 02/15/21, 9:43 AM

## 2021-02-15 NOTE — Telephone Encounter (Signed)
Noted.  The patient was already taken off the schedule.

## 2021-02-15 NOTE — Progress Notes (Signed)
Physical Therapy Treatment Patient Details Name: Cody Bridges MRN: [id number] DOB: 29-Mar-1933 Today's Date: 02/15/2021    History of Present Illness Pt is an 87 y/o M with PMH significant for DM2, HTN, actinic keratosis, atopic dermatitis, GERD, overactive bladder. Patient presented to the ED on 7/16 with complaint of shortness of breath, weakness, progressively worsening for last 2 days, now has difficulty even getting out of bed and ambulating. Pt found to be COVID (+).    PT Comments    Pt seen for PT tx with pt appearing more cognitively alert on this date. Pt is able to increase ambulation distances in the room with supervision but requires ongoing cuing for proper, safe use of AD with very poor carryover. Pt also requires cuing for safety as pt attempts to stand & walk to RW while holding to windowsill. Will continue to follow pt acutely to progress endurance, balance & safety with mobility.    Follow Up Recommendations  Home health PT;Supervision/Assistance - 24 hour     Equipment Recommendations  Rolling walker with 5" wheels    Recommendations for Other Services       Precautions / Restrictions Precautions Precautions: Fall Restrictions Weight Bearing Restrictions: No    Mobility  Bed Mobility Overal bed mobility: Modified Independent Bed Mobility: Sit to Supine       Sit to supine: Modified independent (Device/Increase time) (bed rails)        Transfers Overall transfer level: Needs assistance Equipment used: Rolling walker (2 wheeled) Transfers: Sit to/from Stand Sit to Stand: Supervision            Ambulation/Gait Ambulation/Gait assistance: Supervision Gait Distance (Feet): 110 Feet Assistive device: Rolling walker (2 wheeled) Gait Pattern/deviations: Decreased step length - right;Decreased step length - left;Decreased stride length;Trunk flexed Gait velocity: decreased   General Gait Details: Max cuing for upright posture & to ambulate within  base of AD with ongoing cuing throughout gait as pt continues to push RW too far out in front with forward trunk lean.   Stairs             Wheelchair Mobility    Modified Rankin (Stroke Patients Only)       Balance Overall balance assessment: Needs assistance         Standing balance support: Bilateral upper extremity supported;During functional activity Standing balance-Leahy Scale: Fair                              Cognition Arousal/Alertness: Awake/alert Behavior During Therapy: Flat affect Overall Cognitive Status: No family/caregiver present to determine baseline cognitive functioning                                 General Comments: AxOx4. Seems more cognitively clear today (pt is able to report he's been in the hospital since Saturday, which he was unable to do yesterday).      Exercises      General Comments General comments (skin integrity, edema, etc.): HR 82-108 bpm, reviewed use of incentive spirometer with pt attempting to use it incorrectly before recalling how to properly use device      Pertinent Vitals/Pain Pain Assessment: No/denies pain    Home Living                      Prior Function  PT Goals (current goals can now be found in the care plan section) Acute Rehab PT Goals Patient Stated Goal: get better and go home to my girlfriend PT Goal Formulation: With patient Time For Goal Achievement: 02/28/21 Potential to Achieve Goals: Good Progress towards PT goals: Progressing toward goals    Frequency    Min 2X/week      PT Plan Current plan remains appropriate    Co-evaluation              AM-PAC PT "6 Clicks" Mobility   Outcome Measure  Help needed turning from your back to your side while in a flat bed without using bedrails?: None Help needed moving from lying on your back to sitting on the side of a flat bed without using bedrails?: A Little Help needed moving to and  from a bed to a chair (including a wheelchair)?: A Little Help needed standing up from a chair using your arms (e.g., wheelchair or bedside chair)?: A Little Help needed to walk in hospital room?: A Little Help needed climbing 3-5 steps with a railing? : A Little 6 Click Score: 19    End of Session   Activity Tolerance: Patient tolerated treatment well Patient left: in bed;with call bell/phone within reach;with bed alarm set Nurse Communication: Mobility status PT Visit Diagnosis: Unsteadiness on feet (R26.81);Difficulty in walking, not elsewhere classified (R26.2);Muscle weakness (generalized) (M62.81)     Time: 9563-8756 PT Time Calculation (min) (ACUTE ONLY): 17 min  Charges:  $Gait Training: 8-22 mins                     Lavone Nian, PT, DPT 02/15/21, 3:23 PM    Waunita Schooner 02/15/2021, 3:22 PM

## 2021-02-15 NOTE — TOC Progression Note (Addendum)
Transition of Care [facility]) - Progression Note    Patient Details  Name: Cody Bridges Near MRN: [id number] Date of Birth: Oct 27, 1932  [facility]) CM/SW [place], RN Phone Number: 02/15/2021, 12:52 PM  Clinical Narrative:  Unable to speak with patient, call sister Cody Bridges to discuss discharge plan tomorrow, need for [facility] PT/OT, sister consent to [facility] services,  no preference at this time. [facility] Corene Cornea notified and will provide service. Sister Cody Bridges also consent to providing transportation for patient when discharged. Zack from [employer] will provide rolling walker.         Expected Discharge Plan and Services                                                 Social Determinants of Health (SDOH) Interventions    Readmission Risk Interventions No flowsheet data found.

## 2021-02-16 DIAGNOSIS — U071 COVID-19: Secondary | ICD-10-CM | POA: Diagnosis not present

## 2021-02-16 LAB — CBC WITH DIFFERENTIAL/PLATELET
Abs Immature Granulocytes: 0.03 10*3/uL (ref 0.00–0.07)
Basophils Absolute: 0 10*3/uL (ref 0.0–0.1)
Basophils Relative: 0 %
Eosinophils Absolute: 0.1 10*3/uL (ref 0.0–0.5)
Eosinophils Relative: 1 %
HCT: 36.6 % — ABNORMAL LOW (ref 39.0–52.0)
Hemoglobin: 12.2 g/dL — ABNORMAL LOW (ref 13.0–17.0)
Immature Granulocytes: 1 %
Lymphocytes Relative: 32 %
Lymphs Abs: 2 10*3/uL (ref 0.7–4.0)
MCH: 31.5 pg (ref 26.0–34.0)
MCHC: 33.3 g/dL (ref 30.0–36.0)
MCV: 94.6 fL (ref 80.0–100.0)
Monocytes Absolute: 0.9 10*3/uL (ref 0.1–1.0)
Monocytes Relative: 14 %
Neutro Abs: 3.3 10*3/uL (ref 1.7–7.7)
Neutrophils Relative %: 52 %
Platelets: 262 10*3/uL (ref 150–400)
RBC: 3.87 MIL/uL — ABNORMAL LOW (ref 4.22–5.81)
RDW: 13.2 % (ref 11.5–15.5)
WBC: 6.3 10*3/uL (ref 4.0–10.5)
nRBC: 0 % (ref 0.0–0.2)

## 2021-02-16 LAB — COMPREHENSIVE METABOLIC PANEL
ALT: 27 U/L (ref 0–44)
AST: 36 U/L (ref 15–41)
Albumin: 3.2 g/dL — ABNORMAL LOW (ref 3.5–5.0)
Alkaline Phosphatase: 44 U/L (ref 38–126)
Anion gap: 8 (ref 5–15)
BUN: 26 mg/dL — ABNORMAL HIGH (ref 8–23)
CO2: 31 mmol/L (ref 22–32)
Calcium: 8.6 mg/dL — ABNORMAL LOW (ref 8.9–10.3)
Chloride: 96 mmol/L — ABNORMAL LOW (ref 98–111)
Creatinine, Ser: 1 mg/dL (ref 0.61–1.24)
GFR, Estimated: >60 mL/min (ref >60)
Glucose, Bld: 108 mg/dL — ABNORMAL HIGH (ref 70–99)
Potassium: 3.5 mmol/L (ref 3.5–5.1)
Sodium: 135 mmol/L (ref 135–145)
Total Bilirubin: 0.6 mg/dL (ref 0.3–1.2)
Total Protein: 6.6 g/dL (ref 6.5–8.1)

## 2021-02-16 LAB — C-REACTIVE PROTEIN: CRP: 1 mg/dL — ABNORMAL HIGH (ref <1.0)

## 2021-02-16 LAB — GLUCOSE, CAPILLARY
Glucose-Capillary: 105 mg/dL — ABNORMAL HIGH (ref 70–99)
Glucose-Capillary: 126 mg/dL — ABNORMAL HIGH (ref 70–99)

## 2021-02-16 MED ORDER — ALBUTEROL SULFATE HFA 108 (90 BASE) MCG/ACT IN AERS: 1.0000 | INHALATION_SPRAY | Freq: Four times a day (QID) | RESPIRATORY_TRACT | Status: DC | PRN

## 2021-02-16 MED ORDER — GUAIFENESIN-DM 100-10 MG/5ML PO SYRP: 10.0000 mL | ORAL_SOLUTION | ORAL | 0 refills | Status: DC | PRN

## 2021-02-16 MED ORDER — FLUTICASONE PROPIONATE 50 MCG/ACT NA SUSP: 2.0000 | Freq: Every day | NASAL | Status: DC

## 2021-02-16 NOTE — Discharge Summary (Signed)
Physician Discharge Summary  Cody Bridges JGO:[id number] DOB: Dec 10, 1932 DOA: 02/13/2021  PCP: Leone Haven, MD  Admit date: 02/13/2021 Discharge date: 02/16/2021  Admitted From: Home Discharge disposition: Home with home with PT   Code Status: Full Code   Discharge Diagnosis:   Principal Problem:   COVID-19 virus infection Active Problems:   Diabetes mellitus without complication ([place])   Hypertension   Hyperlipemia, mixed   Benign prostatic hyperplasia with urinary frequency   Shortness of breath    Chief Complaint  Patient presents with   COVID sx   Brief narrative: Cody Bridges is a 87 y.o. male with PMH significant for DM2, HTN, actinic keratosis, atopic dermatitis, GERD, overactive bladder. Patient presented to the ED on 7/16 with complaint of shortness of breath, weakness, progressively worsening for last 2 days, now has difficulty even getting out of bed and ambulating.  Recently took a trip to [facility] on Monday, 02/08/2021.  Of note, he is vaccinated with [place], two doses and two booster   In the ED, patient had a temperature of 101, heart rate elevated to 107, oxygen saturation 94% on room air. Labs showed WBC count slightly up at 11.4, lactic acid slightly up at 2, procalcitonin level negative COVID PCR positive Chest x-ray did not show any infiltrate, consolidation, edema or effusion. Admitted to hospitalist service for further evaluation management  Subjective: Seen and examined this morning.  Elderly Caucasian male. Lying on bed.  Not in supplemental oxygen.  Not in distress.  Feels comfortable to go home today.  Hospital course: COVID infection -Presented with shortness of breath, cough, lethargy -COVID test: PCR positive on admission -Chest imaging: No evidence of pneumonia -Treatment: Completed 3-day course of IV remdesivir today. -Progression: Improving.  Not in respiratory distress.  Not requiring supplemental oxygen.  -Supportive  care: Vitamin C, Zinc, PRN inhalers, Tylenol, Antitussives (benzonatate/ Mucinex/Tussionex).   -Encouraged incentive spirometry, prone position, out of bed and early mobilization as much as possible -Airborne precautions -WBC and inflammatory markers trend as below.  Recent Labs  Lab 02/13/21 1117 02/13/21 1145 02/13/21 1146 02/13/21 1316 02/14/21 0526 02/15/21 0447 02/16/21 0457  SARSCOV2NAA  --  POSITIVE*  --   --   --   --   --   WBC  --   --  11.4*  --  8.1 6.3 6.3  LATICACIDVEN 2.0*  --   --  1.6  --   --   --   PROCALCITON  --   --  <0.10  --   --   --   --   DDIMER  --   --  0.79*  --   --   --   --   CRP  --   --   --   --  2.8* 2.2* 1.0*  ALT 21  --   --   --  '19 22 27   ' Generalized weakness/gait instability -Probably secondary to COVID 19 infection. -PT eval 10.  Home health PT recommended.  Type 2 diabetes mellitus -A1c 6.5 in 7/17. -Home meds include metformin 500 mg twice daily -Resume the same at home. Recent Labs  Lab 02/15/21 0836 02/15/21 1222 02/15/21 1610 02/15/21 2212 02/16/21 0744  GLUCAP 118* 112* 107* 105* 126*   Essential hypertension -Continue amlodipine 5 mg daily and HCTZ 20 mg daily  Hyperlipidemia -Continue Crestor 20 mg daily  GERD -Continue PPI  BPH -Continue Flomax  Atopic dermatitis and eczema  -Continue Dupixent injection subcu  outpatient with dermatology   Allergies as of 02/16/2021       Reactions   Clindamycin Hives   Lisinopril Cough   Penicillin V Potassium Rash, Other (See Comments)   Has patient had a PCN reaction causing immediate rash, facial/tongue/throat swelling, SOB or lightheadedness with hypotension: Unknown Has patient had a PCN reaction causing severe rash involving mucus membranes or skin necrosis: Unknown Has patient had a PCN reaction that required hospitalization: No Has patient had a PCN reaction occurring within the last 10 years: No If all of the above answers are "NO", then may proceed with  Cephalosporin use.        Medication List     STOP taking these medications    triamcinolone lotion 0.1 % Commonly known as: KENALOG       TAKE these medications    Accu-Chek Guide test strip Generic drug: glucose blood USE UP TO 4 TIMES DAILY AS DIRECTED   acetaminophen 500 MG tablet Commonly known as: TYLENOL Take 1,000 mg by mouth every 6 (six) hours as needed for mild pain or moderate pain.   albuterol 108 (90 Base) MCG/ACT inhaler Commonly known as: VENTOLIN HFA Inhale 1-2 puffs into the lungs every 6 (six) hours as needed for wheezing.   amLODipine 5 MG tablet Commonly known as: NORVASC Take 5 mg by mouth daily.   blood glucose meter kit and supplies Kit Dispense based on patient and insurance preference. Use up to four times daily as directed. (FOR ICD-10  E11.9).   budesonide-formoterol 160-4.5 MCG/ACT inhaler Commonly known as: SYMBICORT Inhale 2 puffs into the lungs 2 (two) times daily as needed (for shortness of breath).   calcium carbonate 500 MG chewable tablet Commonly known as: TUMS - dosed in mg elemental calcium Chew 1-2 tablets by mouth 3 (three) times daily as needed (for acid reflux/indigestion.).   carboxymethylcellulose 0.5 % Soln Commonly known as: REFRESH PLUS Apply 1 drop to eye 3 (three) times daily as needed (dry eyes).   CeraVe Daily Moisturizing Lotn Apply 1 application topically daily as needed (dry skin).   Dupixent 300 MG/2ML prefilled syringe Generic drug: dupilumab INJECT 1 SYRINGE UNDER THE SKIN EVERY OTHER WEEK   finasteride 5 MG tablet Commonly known as: PROSCAR Take 5 mg by mouth daily.   fluticasone 50 MCG/ACT nasal spray Commonly known as: FLONASE Place 2 sprays into both nostrils daily.   Gemtesa 75 MG Tabs Generic drug: Vibegron Take 75 mg by mouth daily.   guaiFENesin-dextromethorphan 100-10 MG/5ML syrup Commonly known as: ROBITUSSIN DM Take 10 mLs by mouth every 4 (four) hours as needed for cough.    hydrochlorothiazide 25 MG tablet Commonly known as: HYDRODIURIL TAKE 1 TABLET BY MOUTH EVERY DAY   loratadine 10 MG tablet Commonly known as: CLARITIN TAKE 1 TABLET BY MOUTH EVERY DAY   metFORMIN 500 MG tablet Commonly known as: GLUCOPHAGE TAKE 1 TABLET BY MOUTH TWICE A DAY What changed: when to take this   multivitamin with minerals Tabs tablet Take 1 tablet by mouth daily.   PRESERVISION AREDS PO Take 1 capsule by mouth in the morning and at bedtime.   rosuvastatin 20 MG tablet Commonly known as: CRESTOR TAKE 1 TABLET BY MOUTH EVERY DAY   tamsulosin 0.4 MG Caps capsule Commonly known as: FLOMAX TAKE 1 CAPSULE BY MOUTH EVERY DAY   Trelegy Ellipta 100-62.5-25 MCG/INH Aepb Generic drug: Fluticasone-Umeclidin-Vilant Take 1 puff by mouth daily.   Vitamin D-3 125 MCG (5000 UT) Tabs Take 5,000 Units  by mouth daily.               Durable Medical Equipment  (From admission, onward)           Start     Ordered   02/15/21 1303  For home use only DME Walker rolling  Once       Question Answer Comment  Walker: With 5 Inch Wheels   Patient needs a walker to treat with the following condition Impaired mobility      02/15/21 1302            Discharge Instructions:  Diet Recommendation: Cardiac/diabetic diet   Follow with Primary MD Leone Haven, MD in 7 days   Get CBC/BMP checked in next visit within 1 week by PCP or SNF MD ( we routinely change or add medications that can affect your baseline labs and fluid status, therefore we recommend that you get the mentioned basic workup next visit with your PCP, your PCP may decide not to get them or add new tests based on their clinical decision)  On your next visit with your PCP, please Get Medicines reviewed and adjusted.  Please request your PCP  to go over all Hospital Tests and Procedure/Radiological results at the follow up, please get all Hospital records sent to your Prim MD by signing hospital  release before you go home.  Activity: As tolerated with Full fall precautions use walker/cane & assistance as needed  For Heart failure patients - Check your Weight same time everyday, if you gain over 2 pounds, or you develop in leg swelling, experience more shortness of breath or chest pain, call your Primary MD immediately. Follow Cardiac Low Salt Diet and 1.5 lit/day fluid restriction.  If you have smoked or chewed Tobacco in the last 2 yrs please stop smoking, stop any regular Alcohol  and or any Recreational drug use.  If you experience worsening of your admission symptoms, develop shortness of breath, life threatening emergency, suicidal or homicidal thoughts you must seek medical attention immediately by calling 911 or calling your MD immediately  if symptoms less severe.  You Must read complete instructions/literature along with all the possible adverse reactions/side effects for all the Medicines you take and that have been prescribed to you. Take any new Medicines after you have completely understood and accpet all the possible adverse reactions/side effects.   Do not drive, operate heavy machinery, perform activities at heights, swimming or participation in water activities or provide baby sitting services if your were admitted for syncope or siezures until you have seen by Primary MD or a Neurologist and advised to do so again.  Do not drive when taking Pain medications.  Do not take more than prescribed Pain, Sleep and Anxiety Medications  Wear Seat belts while driving.   Please note You were cared for by a hospitalist during your hospital stay. If you have any questions about your discharge medications or the care you received while you were in the hospital after you are discharged, you can call the unit and asked to speak with the hospitalist on call if the hospitalist that took care of you is not available. Once you are discharged, your primary care physician will handle any  further medical issues. Please note that NO REFILLS for any discharge medications will be authorized once you are discharged, as it is imperative that you return to your primary care physician (or establish a relationship with a primary care physician if you do  not have one) for your aftercare needs so that they can reassess your need for medications and monitor your lab values.    Follow ups:    Follow-up Information     Leone Haven, MD Follow up.   Specialty: Family Medicine Contact information: [address] [place] [address] [phone]                 Wound care:   Incision (Closed) 08/22/17 Eye Left (Active)  Date First Assessed/Time First Assessed: 08/22/17 0729   Location: Eye  Location Orientation: Left    Assessments 08/22/2017  8:37 AM  Dressing Type None  Drainage Amount None     No Linked orders to display    Discharge Exam:   Vitals:   02/15/21 1736 02/15/21 2208 02/16/21 0624 02/16/21 0845  BP: 113/73 126/67 125/72 (!) 141/77  Pulse: 79 73 75 74  Resp:  '18 16 18  ' Temp: 98.1 F (36.7 C) 98.4 F (36.9 C) 97.8 F (36.6 C) 97.8 F (36.6 C)  TempSrc:      SpO2: 98% 96% 96% 95%  Weight:      Height:        Body mass index is 28.47 kg/m.  General exam: Pleasant, elderly Caucasian male.  Not in distress Skin: No rashes, lesions or ulcers. HEENT: Atraumatic, normocephalic, no obvious bleeding Lungs: Clear to auscultation bilaterally. CVS: Regular rate and rhythm, no murmur GI/Abd soft, nontender, nondistended, bowel sound present CNS: Alert, awake, oriented x3 Psychiatry: Mood appropriate Extremities: No pedal edema, no calf tenderness  Time coordinating discharge: 35 minutes   The results of significant diagnostics from this hospitalization (including imaging, microbiology, ancillary and laboratory) are listed below for reference.    Procedures and Diagnostic Studies:   DG Chest 2 View  Result Date:  02/13/2021 CLINICAL DATA:  Shortness of breath. EXAM: CHEST - 2 VIEW COMPARISON:  Or 01/2018, chest CT FINDINGS: Enlarged cardiac silhouette. Calcific atherosclerotic disease and tortuosity of the aorta. There is no evidence of focal airspace consolidation, pleural effusion or pneumothorax. Osseous structures are without acute abnormality. Soft tissues are grossly normal. IMPRESSION: 1. Enlarged cardiac silhouette. 2. Calcific atherosclerotic disease and tortuosity of the aorta. Electronically Signed   By: Fidela Salisbury M.D.   On: 02/13/2021 12:04     Labs:   Basic Metabolic Panel: Recent Labs  Lab 02/13/21 1117 02/14/21 0526 02/15/21 0447 02/16/21 0457  NA 136 137 134* 135  K 4.1 4.2 3.3* 3.5  CL 100 99 96* 96*  CO2 '25 31 30 31  ' GLUCOSE 111* 109* 110* 108*  BUN '19 23 21 ' 26*  CREATININE 1.04 1.14 0.93 1.00  CALCIUM 9.2 8.8* 8.7* 8.6*   GFR Estimated Creatinine Clearance: 61 mL/min (by C-G formula based on SCr of 1 mg/dL). Liver Function Tests: Recent Labs  Lab 02/13/21 1117 02/14/21 0526 02/15/21 0447 02/16/21 0457  AST 25 28 37 36  ALT '21 19 22 27  ' ALKPHOS 49 46 43 44  BILITOT 0.7 0.7 0.7 0.6  PROT 7.4 6.6 6.6 6.6  ALBUMIN 3.8 3.4* 3.2* 3.2*   No results for input(s): LIPASE, AMYLASE in the last 168 hours. No results for input(s): AMMONIA in the last 168 hours. Coagulation profile Recent Labs  Lab 02/13/21 1146  INR 1.1    CBC: Recent Labs  Lab 02/13/21 1146 02/14/21 0526 02/15/21 0447 02/16/21 0457  WBC 11.4* 8.1 6.3 6.3  NEUTROABS 8.8* 4.9 3.1 3.3  HGB 12.8* 12.2* 12.1* 12.2*  HCT 37.8* 36.7* 36.2* 36.6*  MCV 95.5 96.6 95.0 94.6  PLT 322 250 253 262   Cardiac Enzymes: No results for input(s): CKTOTAL, CKMB, CKMBINDEX, TROPONINI in the last 168 hours. BNP: Invalid input(s): POCBNP CBG: Recent Labs  Lab 02/15/21 0836 02/15/21 1222 02/15/21 1610 02/15/21 2212 02/16/21 0744  GLUCAP 118* 112* 107* 105* 126*   D-Dimer Recent Labs     02/13/21 1146  DDIMER 0.79*   Hgb A1c Recent Labs    02/14/21 0526  HGBA1C 6.5*   Lipid Profile No results for input(s): CHOL, HDL, LDLCALC, TRIG, CHOLHDL, LDLDIRECT in the last 72 hours. Thyroid function studies No results for input(s): TSH, T4TOTAL, T3FREE, THYROIDAB in the last 72 hours.  Invalid input(s): FREET3 Anemia work up No results for input(s): VITAMINB12, FOLATE, FERRITIN, TIBC, IRON, RETICCTPCT in the last 72 hours. Microbiology Recent Results (from the past 240 hour(s))  Culture, blood (Routine x 2)     Status: None (Preliminary result)   Collection Time: 02/13/21 11:17 AM   Specimen: BLOOD  Result Value Ref Range Status   Specimen Description BLOOD BLOOD RIGHT FOREARM  Final   Special Requests   Final    BOTTLES DRAWN AEROBIC AND ANAEROBIC Blood Culture results may not be optimal due to an inadequate volume of blood received in culture bottles   Culture   Final    NO GROWTH 3 DAYS Performed at [facility], [address], [place], [place] [address]    Report Status PENDING  Incomplete  Culture, blood (Routine x 2)     Status: None (Preliminary result)   Collection Time: 02/13/21 11:45 AM   Specimen: BLOOD  Result Value Ref Range Status   Specimen Description BLOOD RIGHT AC  Final   Special Requests   Final    BOTTLES DRAWN AEROBIC AND ANAEROBIC Blood Culture results may not be optimal due to an excessive volume of blood received in culture bottles   Culture   Final    NO GROWTH 3 DAYS Performed at [facility], [address], [place], [place] [address]    Report Status PENDING  Incomplete  Resp Panel by RT-PCR (Flu A&B, Covid) Nasopharyngeal Swab     Status: Abnormal   Collection Time: 02/13/21 11:45 AM   Specimen: Nasopharyngeal Swab; Nasopharyngeal(NP) swabs in vial transport medium  Result Value Ref Range Status   SARS Coronavirus 2 by RT PCR POSITIVE (A) NEGATIVE Final    Comment: RESULT CALLED TO, READ BACK BY AND VERIFIED  WITH: AMBER PAYNE @ 1500 ON 02/13/2021 BY [facility] (NOTE) SARS-CoV-2 target nucleic acids are DETECTED.  The SARS-CoV-2 RNA is generally detectable in upper respiratory specimens during the acute phase of infection. Positive results are indicative of the presence of the identified virus, but do not rule out bacterial infection or co-infection with other pathogens not detected by the test. Clinical correlation with patient history and other diagnostic information is necessary to determine patient infection status. The expected result is Negative.  Fact Sheet for Patients: EntrepreneurPulse.com.au  Fact Sheet for Healthcare Providers: IncredibleEmployment.be  This test is not yet approved or cleared by the [place] FDA and  has been authorized for detection and/or diagnosis of SARS-CoV-2 by FDA under an Emergency Use Authorization (EUA).  This EUA will remain in effect (meaning this test c an be used) for the duration of  the COVID-19 declaration under Section 564(b)(1) of the Act, 21 U.S.C. section 360bbb-3(b)(1), unless the authorization is terminated or revoked sooner.  Influenza A by PCR NEGATIVE NEGATIVE Final   Influenza B by PCR NEGATIVE NEGATIVE Final    Comment: (NOTE) The Xpert Xpress SARS-CoV-2/FLU/RSV plus assay is intended as an aid in the diagnosis of influenza from Nasopharyngeal swab specimens and should not be used as a sole basis for treatment. Nasal washings and aspirates are unacceptable for Xpert Xpress SARS-CoV-2/FLU/RSV testing.  Fact Sheet for Patients: EntrepreneurPulse.com.au  Fact Sheet for Healthcare Providers: IncredibleEmployment.be  This test is not yet approved or cleared by the [place] FDA and has been authorized for detection and/or diagnosis of SARS-CoV-2 by FDA under an Emergency Use Authorization (EUA). This EUA will remain in effect (meaning this test can be  used) for the duration of the COVID-19 declaration under Section 564(b)(1) of the Act, 21 U.S.C. section 360bbb-3(b)(1), unless the authorization is terminated or revoked.  Performed at [facility], [address], [place], [place] [address]      Signed: Terrilee Croak  [facility] 02/16/2021, 11:05 AM

## 2021-02-16 NOTE — Progress Notes (Signed)
Patient discharged to home with family care in stable condition. Discharge instructions provided to patient and daughter. Verbalized understanding.

## 2021-02-17 ENCOUNTER — Telehealth: Payer: Self-pay

## 2021-02-17 NOTE — Telephone Encounter (Signed)
Transition Care Management Follow-up Telephone Call Date of discharge and from where: 02/16/2021-[facility] How have you been since you were released from the hospital? Doing better. Getting around with walker. Appetite is good. Any questions or concerns? No  Items Reviewed: Did the pt receive and understand the discharge instructions provided? Yes  Medications obtained and verified? Yes  Other? Yes  Any new allergies since your discharge? No  Dietary orders reviewed? Yes Do you have support at home? Yes   Home Care and Equipment/Supplies: Were home health services ordered? yes If so, what is the name of the agency? unknown  Has the agency set up a time to come to the patient's home? no Were any new equipment or medical supplies ordered?  No What is the name of the medical supply agency? N/a Were you able to get the supplies/equipment? not applicable Do you have any questions related to the use of the equipment or supplies? N/a  Functional Questionnaire: (I = Independent and D = Dependent) ADLs: I  Bathing/Dressing- I  Meal Prep- D  Eating- I  Maintaining continence- I  Transferring/Ambulation- I WITH WALKER  Managing Meds- I  Follow up appointments reviewed:  PCP Hospital f/u appt confirmed? Yes  Scheduled to see Dr. Caryl Bis on 02/25/21 @ 11:30. [place] [facility] f/u appt confirmed?  N/a   Are transportation arrangements needed? No  If their condition worsens, is the pt aware to call PCP or go to the Emergency Dept.? Yes Was the patient provided with contact information for the PCP's office or ED? Yes Was to pt encouraged to call back with questions or concerns? Yes

## 2021-02-17 NOTE — Telephone Encounter (Signed)
Reviewed

## 2021-02-18 LAB — CULTURE, BLOOD (ROUTINE X 2)
Culture: NO GROWTH
Culture: NO GROWTH

## 2021-02-25 ENCOUNTER — Other Ambulatory Visit: Payer: Self-pay

## 2021-02-25 ENCOUNTER — Ambulatory Visit (INDEPENDENT_AMBULATORY_CARE_PROVIDER_SITE_OTHER): Payer: Medicare Other | Admitting: Family Medicine

## 2021-02-25 ENCOUNTER — Encounter: Payer: Self-pay | Admitting: Family Medicine

## 2021-02-25 ENCOUNTER — Ambulatory Visit (INDEPENDENT_AMBULATORY_CARE_PROVIDER_SITE_OTHER): Payer: Medicare Other

## 2021-02-25 VITALS — BP 138/70 | HR 83 | Temp 96.7°F | Ht 70.0 in | Wt 193.0 lb

## 2021-02-25 DIAGNOSIS — U071 COVID-19: Secondary | ICD-10-CM

## 2021-02-25 DIAGNOSIS — E8809 Other disorders of plasma-protein metabolism, not elsewhere classified: Secondary | ICD-10-CM

## 2021-02-25 DIAGNOSIS — I10 Essential (primary) hypertension: Secondary | ICD-10-CM

## 2021-02-25 DIAGNOSIS — E119 Type 2 diabetes mellitus without complications: Secondary | ICD-10-CM | POA: Diagnosis not present

## 2021-02-25 DIAGNOSIS — L209 Atopic dermatitis, unspecified: Secondary | ICD-10-CM

## 2021-02-25 LAB — COMPREHENSIVE METABOLIC PANEL
ALT: 16 U/L (ref 0–53)
AST: 15 U/L (ref 0–37)
Albumin: 3.9 g/dL (ref 3.5–5.2)
Alkaline Phosphatase: 61 U/L (ref 39–117)
BUN: 23 mg/dL (ref 6–23)
CO2: 31 mEq/L (ref 19–32)
Calcium: 10.1 mg/dL (ref 8.4–10.5)
Chloride: 96 mEq/L (ref 96–112)
Creatinine, Ser: 1.01 mg/dL (ref 0.40–1.50)
GFR: 67.59 mL/min (ref >60.00)
Glucose, Bld: 95 mg/dL (ref 70–99)
Potassium: 4 mEq/L (ref 3.5–5.1)
Sodium: 136 mEq/L (ref 135–145)
Total Bilirubin: 0.5 mg/dL (ref 0.2–1.2)
Total Protein: 7.6 g/dL (ref 6.0–8.3)

## 2021-02-25 NOTE — Progress Notes (Signed)
[name], MD Phone: [phone]  Cody Bridges is a 87 y.o. male who presents today for follow-up.  COVID-19: The patient was hospitalized for this.  He was treated with remdesivir.  He feels like he is pretty much back to normal.  He notes no cough or shortness of breath.  He notes his energy level is stable compared to prior to having COVID.  He did note some issues with feeling as though he is going to fall while in the bathtub and notes that was going on prior to [place].  Notes that has improved somewhat over the last week.  He has had no recent falls.  Home health physical therapy has come to the house and is going to work with him on strengthening and balance.  He is unsure if he actually needs them to work with him.  He has had 2 COVID vaccines and 1 booster vaccine.  He does note a bruise on his right radial aspect wrist where he had a IV placed.  Hypertension: Not checking blood pressures.  He is taking amlodipine and hydrochlorothiazide.  No chest pain, shortness of breath, or edema.  Diabetes: Has not been checking very frequently though does note it was 151 yesterday.  Taking metformin.  No polyuria or polydipsia.  No hypoglycemia.  Social History   Tobacco Use  Smoking Status Former   Packs/day: 2.00   Years: 35.00   Pack years: 70.00   Types: Cigarettes   Quit date: 10/30/1985   Years since quitting: 35.3  Smokeless Tobacco Never    Current Outpatient Medications on File Prior to Visit  Medication Sig Dispense Refill   ACCU-CHEK GUIDE test strip USE UP TO 4 TIMES DAILY AS DIRECTED 100 strip 0   acetaminophen (TYLENOL) 500 MG tablet Take 1,000 mg by mouth every 6 (six) hours as needed for mild pain or moderate pain.     albuterol (VENTOLIN HFA) 108 (90 Base) MCG/ACT inhaler Inhale 1-2 puffs into the lungs every 6 (six) hours as needed for wheezing.     amLODipine (NORVASC) 5 MG tablet Take 5 mg by mouth daily.     blood glucose meter kit and supplies KIT Dispense  based on patient and insurance preference. Use up to four times daily as directed. (FOR ICD-10  E11.9). 1 each 0   budesonide-formoterol (SYMBICORT) 160-4.5 MCG/ACT inhaler Inhale 2 puffs into the lungs 2 (two) times daily as needed (for shortness of breath).     calcium carbonate (TUMS - DOSED IN MG ELEMENTAL CALCIUM) 500 MG chewable tablet Chew 1-2 tablets by mouth 3 (three) times daily as needed (for acid reflux/indigestion.).     carboxymethylcellulose (REFRESH PLUS) 0.5 % SOLN Apply 1 drop to eye 3 (three) times daily as needed (dry eyes).     Cholecalciferol (VITAMIN D-3) 5000 units TABS Take 5,000 Units by mouth daily.      DUPIXENT 300 MG/2ML prefilled syringe INJECT 1 SYRINGE UNDER THE SKIN EVERY OTHER WEEK 4 mL 4   Emollient (CERAVE DAILY MOISTURIZING) LOTN Apply 1 application topically daily as needed (dry skin).     finasteride (PROSCAR) 5 MG tablet Take 5 mg by mouth daily.     fluticasone (FLONASE) 50 MCG/ACT nasal spray Place 2 sprays into both nostrils daily.     Fluticasone-Umeclidin-Vilant (TRELEGY ELLIPTA) 100-62.5-25 MCG/INH AEPB Take 1 puff by mouth daily. 120 each 2   guaiFENesin-dextromethorphan (ROBITUSSIN DM) 100-10 MG/5ML syrup Take 10 mLs by mouth every 4 (four) hours as needed for  cough. 118 mL 0   hydrochlorothiazide (HYDRODIURIL) 25 MG tablet TAKE 1 TABLET BY MOUTH EVERY DAY 90 tablet 0   loratadine (CLARITIN) 10 MG tablet TAKE 1 TABLET BY MOUTH EVERY DAY 90 tablet 1   metFORMIN (GLUCOPHAGE) 500 MG tablet TAKE 1 TABLET BY MOUTH TWICE A DAY 180 tablet 5   Multiple Vitamin (MULTIVITAMIN WITH MINERALS) TABS tablet Take 1 tablet by mouth daily.     Multiple Vitamins-Minerals (PRESERVISION AREDS PO) Take 1 capsule by mouth in the morning and at bedtime.     rosuvastatin (CRESTOR) 20 MG tablet TAKE 1 TABLET BY MOUTH EVERY DAY 90 tablet 0   tamsulosin (FLOMAX) 0.4 MG CAPS capsule TAKE 1 CAPSULE BY MOUTH EVERY DAY 90 capsule 0   Vibegron (GEMTESA) 75 MG TABS Take 75 mg by  mouth daily. 28 tablet 0   Current Facility-Administered Medications on File Prior to Visit  Medication Dose Route Frequency Provider Last Rate Last Admin   dupilumab (DUPIXENT) prefilled syringe 300 mg  300 mg Subcutaneous Q14 Days Ralene Bathe, MD   300 mg at 02/25/21 1024   Dupilumab SOPN 300 mg  300 mg Subcutaneous Once Ralene Bathe, MD         ROS see history of present illness  Objective  Physical Exam Vitals:   02/25/21 1132  BP: 138/70  Pulse: 83  Temp: (!) 96.7 F (35.9 C)  SpO2: 95%    BP Readings from Last 3 Encounters:  02/25/21 138/70  02/16/21 (!) 143/94  02/03/21 (!) 124/56   Wt Readings from Last 3 Encounters:  02/25/21 193 lb (87.5 kg)  02/13/21 198 lb 6.6 oz (90 kg)  02/03/21 199 lb (90.3 kg)    Physical Exam Constitutional:      General: He is not in acute distress.    Appearance: He is not diaphoretic.  Cardiovascular:     Rate and Rhythm: Normal rate and regular rhythm.     Heart sounds: Normal heart sounds.  Pulmonary:     Effort: Pulmonary effort is normal.     Breath sounds: Normal breath sounds.  Musculoskeletal:     Right lower leg: No edema.     Left lower leg: No edema.  Skin:    General: Skin is warm and dry.     Comments: Bruise over the right wrist radial aspect, nontender  Neurological:     Mental Status: He is alert.     Assessment/Plan: Please see individual problem list.  Problem List Items Addressed This Visit     COVID-19 - Primary    The patient has recovered quite well.  He will work with physical therapy on his weakness.  If he feels as though they are not beneficial he will discontinue that service.  I discussed the bruise on his wrist should improve with time though it may take quite a while.       Diabetes mellitus without complication (HCC)    Recent A1c well controlled.  He will continue metformin.       Hypertension    Adequate control.  He will continue amlodipine and  hydrochlorothiazide.       Other Visit Diagnoses     Hypoalbuminemia       Relevant Orders   Comp Met (CMET)      Return in about 3 months (around 05/28/2021).  This visit occurred during the SARS-CoV-2 public health emergency.  Safety protocols were in place, including screening questions prior to the visit, additional  usage of staff PPE, and extensive cleaning of exam room while observing appropriate contact time as indicated for disinfecting solutions.    [name], MD [place]

## 2021-02-25 NOTE — Assessment & Plan Note (Signed)
Adequate control.  He will continue amlodipine and hydrochlorothiazide.

## 2021-02-25 NOTE — Assessment & Plan Note (Addendum)
The patient has recovered quite well.  He will work with physical therapy on his weakness.  If he feels as though they are not beneficial he will discontinue that service.  I discussed the bruise on his wrist should improve with time though it may take quite a while.

## 2021-02-25 NOTE — Assessment & Plan Note (Signed)
Recent A1c well controlled.  He will continue metformin.

## 2021-02-25 NOTE — Progress Notes (Signed)
Patient here for two week Dupixent injection.   Dupixent '300mg'$ /62m injected SQ into left arm. Patient tolerated well.   LOT: [id number]: 06/2023

## 2021-02-25 NOTE — Patient Instructions (Signed)
Nice to see you. We are going to recheck some labs and contact you with the results. Please see how it goes with physical therapy at home.  If you do not feel it is beneficial you can always cancel this.

## 2021-02-28 ENCOUNTER — Other Ambulatory Visit: Payer: Self-pay | Admitting: Family Medicine

## 2021-03-01 DIAGNOSIS — H353221 Exudative age-related macular degeneration, left eye, with active choroidal neovascularization: Secondary | ICD-10-CM | POA: Diagnosis not present

## 2021-03-01 LAB — HM DIABETES EYE EXAM

## 2021-03-02 DIAGNOSIS — N401 Enlarged prostate with lower urinary tract symptoms: Secondary | ICD-10-CM | POA: Diagnosis not present

## 2021-03-02 DIAGNOSIS — E782 Mixed hyperlipidemia: Secondary | ICD-10-CM | POA: Diagnosis not present

## 2021-03-02 DIAGNOSIS — N3281 Overactive bladder: Secondary | ICD-10-CM | POA: Diagnosis not present

## 2021-03-02 DIAGNOSIS — Z7984 Long term (current) use of oral hypoglycemic drugs: Secondary | ICD-10-CM

## 2021-03-02 DIAGNOSIS — Z8616 Personal history of COVID-19: Secondary | ICD-10-CM | POA: Diagnosis not present

## 2021-03-02 DIAGNOSIS — R350 Frequency of micturition: Secondary | ICD-10-CM | POA: Diagnosis not present

## 2021-03-02 DIAGNOSIS — L570 Actinic keratosis: Secondary | ICD-10-CM

## 2021-03-02 DIAGNOSIS — I10 Essential (primary) hypertension: Secondary | ICD-10-CM | POA: Diagnosis not present

## 2021-03-02 DIAGNOSIS — Z87891 Personal history of nicotine dependence: Secondary | ICD-10-CM

## 2021-03-02 DIAGNOSIS — Z9089 Acquired absence of other organs: Secondary | ICD-10-CM

## 2021-03-02 DIAGNOSIS — E119 Type 2 diabetes mellitus without complications: Secondary | ICD-10-CM | POA: Diagnosis not present

## 2021-03-02 DIAGNOSIS — K219 Gastro-esophageal reflux disease without esophagitis: Secondary | ICD-10-CM | POA: Diagnosis not present

## 2021-03-02 DIAGNOSIS — Z9181 History of falling: Secondary | ICD-10-CM

## 2021-03-02 DIAGNOSIS — J449 Chronic obstructive pulmonary disease, unspecified: Secondary | ICD-10-CM | POA: Diagnosis not present

## 2021-03-02 DIAGNOSIS — H9190 Unspecified hearing loss, unspecified ear: Secondary | ICD-10-CM | POA: Diagnosis not present

## 2021-03-02 DIAGNOSIS — I4510 Unspecified right bundle-branch block: Secondary | ICD-10-CM | POA: Diagnosis not present

## 2021-03-02 DIAGNOSIS — L209 Atopic dermatitis, unspecified: Secondary | ICD-10-CM

## 2021-03-02 DIAGNOSIS — M1990 Unspecified osteoarthritis, unspecified site: Secondary | ICD-10-CM | POA: Diagnosis not present

## 2021-03-02 DIAGNOSIS — Z7951 Long term (current) use of inhaled steroids: Secondary | ICD-10-CM

## 2021-03-03 ENCOUNTER — Telehealth: Payer: Self-pay | Admitting: Family Medicine

## 2021-03-05 ENCOUNTER — Other Ambulatory Visit: Payer: Self-pay

## 2021-03-05 DIAGNOSIS — E119 Type 2 diabetes mellitus without complications: Secondary | ICD-10-CM

## 2021-03-05 MED ORDER — ACCU-CHEK GUIDE VI STRP: ORAL_STRIP | 0 refills | Status: DC

## 2021-03-05 MED ORDER — ACCU-CHEK FASTCLIX LANCETS MISC: 0 refills | Status: DC

## 2021-03-05 NOTE — Telephone Encounter (Signed)
Please resend to pharmacy, did not go through the following; ACCU-CHEK GUIDE test strip and  Accu-Chek FastClix Lancets MISC

## 2021-03-05 NOTE — Telephone Encounter (Signed)
I called and spoke with the pharmacist and they verified they did not receive the Rx for lancets and strips and I reordered the RX's and sent them electronically.  Lashawna Poche,cma

## 2021-03-05 NOTE — Progress Notes (Signed)
Patient called and stated the pharmacy never received the 2 prescriptions lancets, and strips, I called and spoke with the pharmacy and verified they were not received and reordered the prescriptions.  Cody Bridges,cma

## 2021-03-09 ENCOUNTER — Other Ambulatory Visit: Payer: Self-pay | Admitting: Family Medicine

## 2021-03-11 ENCOUNTER — Other Ambulatory Visit: Payer: Self-pay

## 2021-03-11 ENCOUNTER — Ambulatory Visit (INDEPENDENT_AMBULATORY_CARE_PROVIDER_SITE_OTHER): Payer: Medicare Other

## 2021-03-11 DIAGNOSIS — L209 Atopic dermatitis, unspecified: Secondary | ICD-10-CM

## 2021-03-11 NOTE — Progress Notes (Addendum)
Patient here for two week Dupixent injection.  Dupixent '300mg'$ /57m injected into the right lupper arm. Patient tolerated well.  LOT: [id number]: 08/2023

## 2021-03-30 DIAGNOSIS — D2371 Other benign neoplasm of skin of right lower limb, including hip: Secondary | ICD-10-CM | POA: Diagnosis not present

## 2021-03-30 DIAGNOSIS — B351 Tinea unguium: Secondary | ICD-10-CM | POA: Diagnosis not present

## 2021-03-30 DIAGNOSIS — M79675 Pain in left toe(s): Secondary | ICD-10-CM | POA: Diagnosis not present

## 2021-03-30 DIAGNOSIS — M79674 Pain in right toe(s): Secondary | ICD-10-CM | POA: Diagnosis not present

## 2021-03-31 ENCOUNTER — Ambulatory Visit (INDEPENDENT_AMBULATORY_CARE_PROVIDER_SITE_OTHER): Payer: Medicare Other | Admitting: Dermatology

## 2021-03-31 ENCOUNTER — Other Ambulatory Visit: Payer: Self-pay

## 2021-03-31 DIAGNOSIS — L2081 Atopic neurodermatitis: Secondary | ICD-10-CM

## 2021-03-31 DIAGNOSIS — Z1283 Encounter for screening for malignant neoplasm of skin: Secondary | ICD-10-CM | POA: Diagnosis not present

## 2021-03-31 DIAGNOSIS — L853 Xerosis cutis: Secondary | ICD-10-CM | POA: Diagnosis not present

## 2021-03-31 MED ORDER — DUPILUMAB 300 MG/2ML ~~LOC~~ SOSY
300.0000 mg | PREFILLED_SYRINGE | Freq: Once | SUBCUTANEOUS | Status: AC
  Administered 2021-03-31: 300 mg via SUBCUTANEOUS

## 2021-03-31 NOTE — Progress Notes (Signed)
   Follow-Up Visit   Subjective  Cody Bridges is a 87 y.o. male who presents for the following: Follow-up (Atopic neurodermatitis follow up - Dupixent injections).  The following portions of the chart were reviewed this encounter and updated as appropriate:   Tobacco  Allergies  Meds  Problems  Med Hx  Surg Hx  Fam Hx     Review of Systems:  No other skin or systemic complaints except as noted in HPI or Assessment and Plan.  Objective  Well appearing patient in no apparent distress; mood and affect are within normal limits.  All skin waist up examined.  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  He is flared somewhat but is recovering from this flare after recent hospitalization for COVID  Atopic dermatitis - Severe, on Dupixent (biologic medication).  Atopic dermatitis (eczema) is a chronic, relapsing, pruritic condition that can significantly affect quality of life. It is often associated with allergic rhinitis and/or asthma and can require treatment with topical medications, phototherapy, or in severe cases a biologic medication called Dupixent. Flared today due to recent hospitalization  Continue Dupixent injections q2weeks  Dupixent 300 mg/36m given today to left upper arm Lot #[id number] 08/2023  dupilumab (DUPIXENT) prefilled syringe 300 mg  Xerosis cutis Continue Cerave cream daily  Skin cancer screening  Return for 2 weeks with nurse then 4 weeks with Dr. KNehemiah Massed  I, HAshok Cordia CMA, am acting as scribe for DSarina Ser MD . Documentation: I have reviewed the above documentation for accuracy and completeness, and I agree with the above.  DSarina Ser MD

## 2021-03-31 NOTE — Patient Instructions (Signed)

## 2021-04-01 ENCOUNTER — Encounter: Payer: Self-pay | Admitting: Dermatology

## 2021-04-03 ENCOUNTER — Other Ambulatory Visit: Payer: Self-pay | Admitting: Family Medicine

## 2021-04-03 DIAGNOSIS — E119 Type 2 diabetes mellitus without complications: Secondary | ICD-10-CM

## 2021-04-14 ENCOUNTER — Ambulatory Visit: Payer: Medicare Other

## 2021-04-14 ENCOUNTER — Other Ambulatory Visit: Payer: Self-pay | Admitting: Family Medicine

## 2021-04-15 ENCOUNTER — Ambulatory Visit (INDEPENDENT_AMBULATORY_CARE_PROVIDER_SITE_OTHER): Payer: Medicare Other

## 2021-04-15 ENCOUNTER — Other Ambulatory Visit: Payer: Self-pay

## 2021-04-15 DIAGNOSIS — L209 Atopic dermatitis, unspecified: Secondary | ICD-10-CM

## 2021-04-15 NOTE — Progress Notes (Signed)
Patient here for two week Dupixent injection.   Dupixent '300mg'$ /24m injected SQ into right arm. Patient tolerated well.   LOT: [id number]: 09/2023

## 2021-04-28 ENCOUNTER — Other Ambulatory Visit: Payer: Self-pay

## 2021-04-28 ENCOUNTER — Ambulatory Visit (INDEPENDENT_AMBULATORY_CARE_PROVIDER_SITE_OTHER): Payer: Medicare Other

## 2021-04-28 DIAGNOSIS — L209 Atopic dermatitis, unspecified: Secondary | ICD-10-CM | POA: Diagnosis not present

## 2021-04-28 NOTE — Progress Notes (Signed)
Patient here today for two week Dupixent injection.  Dupixent 300mg /69mL injected into left upper arm. Patient tolerated well.  LOT: [id number] EXP: 09/2023

## 2021-05-12 ENCOUNTER — Ambulatory Visit (INDEPENDENT_AMBULATORY_CARE_PROVIDER_SITE_OTHER): Payer: Medicare Other | Admitting: Dermatology

## 2021-05-12 ENCOUNTER — Encounter: Payer: Self-pay | Admitting: Dermatology

## 2021-05-12 ENCOUNTER — Other Ambulatory Visit: Payer: Self-pay

## 2021-05-12 DIAGNOSIS — L578 Other skin changes due to chronic exposure to nonionizing radiation: Secondary | ICD-10-CM | POA: Diagnosis not present

## 2021-05-12 DIAGNOSIS — L2081 Atopic neurodermatitis: Secondary | ICD-10-CM

## 2021-05-12 DIAGNOSIS — H1031 Unspecified acute conjunctivitis, right eye: Secondary | ICD-10-CM

## 2021-05-12 DIAGNOSIS — L570 Actinic keratosis: Secondary | ICD-10-CM | POA: Diagnosis not present

## 2021-05-12 MED ORDER — DUPILUMAB 300 MG/2ML ~~LOC~~ SOAJ
300.0000 mg | Freq: Once | SUBCUTANEOUS | Status: AC
  Administered 2021-05-12: 300 mg via SUBCUTANEOUS

## 2021-05-12 NOTE — Progress Notes (Signed)
   Follow-Up Visit   Subjective  Cody Bridges is a 87 y.o. male who presents for the following: Dermatitis (2 weeks f/u atopic dermatitis, pt here for Dupixent injection ). Check scaly places on face and ears.  His right eye has been itching lately and has been rubbing it may cannot read.  He states he is no longer getting eyeball injections but this was in his left eye for wet macular degeneration.  The following portions of the chart were reviewed this encounter and updated as appropriate:   Tobacco  Allergies  Meds  Problems  Med Hx  Surg Hx  Fam Hx     Review of Systems:  No other skin or systemic complaints except as noted in HPI or Assessment and Plan.  Objective  Well appearing patient in no apparent distress; mood and affect are within normal limits.  A focused examination was performed including face,ears,eyes, arms. Relevant physical exam findings are noted in the Assessment and Plan.  trunk, exts Erythema, edema conjunctival injection  at the right eye        Left Ear x 2 (2) Erythematous thin papules/macules with gritty scale.    Assessment & Plan  Atopic neurodermatitis trunk, exts Atopic neurodermatitis -severe but much improved on current regiment of Dupixent injections.     Atopic dermatitis - Severe, on Dupixent (biologic medication).  Atopic dermatitis (eczema) is a chronic, relapsing, pruritic condition that can significantly affect quality of life. It is often associated with allergic rhinitis and/or asthma and can require treatment with topical medications, phototherapy, or in severe cases a biologic medication called Dupixent. Flared today due to recent hospitalization   Continue Dupixent injections q2weeks   Dupixent 300 mg/31ml given today to right upper arm Lot #[id number] 08/2023   dupilumab (DUPIXENT) prefilled syringe 300 mg  Conjunctival injection at the right eye does not appear to related to Dupixent   Related  Medications Dupilumab SOPN 300 mg  AK (actinic keratosis) Left Ear x 2 Destruction of lesion - Left Ear x 2 Complexity: simple   Destruction method: cryotherapy   Informed consent: discussed and consent obtained   Timeout:  patient name, date of birth, surgical site, and procedure verified Lesion destroyed using liquid nitrogen: Yes   Region frozen until ice ball extended beyond lesion: Yes   Outcome: patient tolerated procedure well with no complications   Post-procedure details: wound care instructions given    Actinic Damage - chronic, secondary to cumulative UV radiation exposure/sun exposure over time - diffuse scaly erythematous macules with underlying dyspigmentation - Recommend daily broad spectrum sunscreen SPF 30+ to sun-exposed areas, reapply every 2 hours as needed.  - Recommend staying in the shade or wearing long sleeves, sun glasses (UVA+UVB protection) and wide brim hats (4-inch brim around the entire circumference of the hat). - Call for new or changing lesions.  Irritation/conjunctivitis of the right eye.   The patient has been on [place] for probably over a year without eye issues.  I think this is just irritation as he said has been itching lately and has been rubbing it.  We will observe this for any evidence of Dupixent related conjunctivitis.  Return in about 2 weeks (around 05/26/2021) for nurse visit, 2 months Atopic dermatitis .  IMarye Round, CMA, am acting as scribe for Sarina Ser, MD .  Documentation: I have reviewed the above documentation for accuracy and completeness, and I agree with the above.  Sarina Ser, MD

## 2021-05-12 NOTE — Patient Instructions (Signed)

## 2021-05-26 ENCOUNTER — Other Ambulatory Visit: Payer: Self-pay

## 2021-05-26 ENCOUNTER — Ambulatory Visit (INDEPENDENT_AMBULATORY_CARE_PROVIDER_SITE_OTHER): Payer: Medicare Other

## 2021-05-26 ENCOUNTER — Other Ambulatory Visit: Payer: Self-pay | Admitting: Family Medicine

## 2021-05-26 ENCOUNTER — Other Ambulatory Visit: Payer: Self-pay | Admitting: Internal Medicine

## 2021-05-26 DIAGNOSIS — L209 Atopic dermatitis, unspecified: Secondary | ICD-10-CM | POA: Diagnosis not present

## 2021-05-26 NOTE — Progress Notes (Signed)
Patient here for two week Dupixent injection.   Dupixent 300mg /48mL injected into Left upper arm. Patient tolerated well.  LOT: [id number] EXP: 08/02/2023

## 2021-05-31 ENCOUNTER — Other Ambulatory Visit: Payer: Self-pay

## 2021-05-31 ENCOUNTER — Ambulatory Visit (INDEPENDENT_AMBULATORY_CARE_PROVIDER_SITE_OTHER): Payer: Medicare Other | Admitting: Family Medicine

## 2021-05-31 ENCOUNTER — Encounter: Payer: Self-pay | Admitting: Family Medicine

## 2021-05-31 VITALS — BP 130/70 | HR 99 | Temp 98.6°F | Ht 70.0 in | Wt 201.0 lb

## 2021-05-31 DIAGNOSIS — R350 Frequency of micturition: Secondary | ICD-10-CM

## 2021-05-31 DIAGNOSIS — R5383 Other fatigue: Secondary | ICD-10-CM | POA: Insufficient documentation

## 2021-05-31 DIAGNOSIS — L84 Corns and callosities: Secondary | ICD-10-CM | POA: Diagnosis not present

## 2021-05-31 DIAGNOSIS — E119 Type 2 diabetes mellitus without complications: Secondary | ICD-10-CM

## 2021-05-31 DIAGNOSIS — N401 Enlarged prostate with lower urinary tract symptoms: Secondary | ICD-10-CM

## 2021-05-31 DIAGNOSIS — I10 Essential (primary) hypertension: Secondary | ICD-10-CM

## 2021-05-31 DIAGNOSIS — J449 Chronic obstructive pulmonary disease, unspecified: Secondary | ICD-10-CM

## 2021-05-31 LAB — BASIC METABOLIC PANEL
BUN: 30 mg/dL — ABNORMAL HIGH (ref 6–23)
CO2: 32 mEq/L (ref 19–32)
Calcium: 9.6 mg/dL (ref 8.4–10.5)
Chloride: 99 mEq/L (ref 96–112)
Creatinine, Ser: 1.11 mg/dL (ref 0.40–1.50)
GFR: 60.24 mL/min (ref >60.00)
Glucose, Bld: 134 mg/dL — ABNORMAL HIGH (ref 70–99)
Potassium: 3.9 mEq/L (ref 3.5–5.1)
Sodium: 140 mEq/L (ref 135–145)

## 2021-05-31 LAB — HEMOGLOBIN A1C: Hgb A1c MFr Bld: 6.5 % (ref 4.6–6.5)

## 2021-05-31 LAB — VITAMIN B12: Vitamin B-12: 354 pg/mL (ref 211–911)

## 2021-05-31 MED ORDER — BLOOD GLUCOSE MONITOR KIT
PACK | 0 refills | Status: AC
Start: 2021-05-31 — End: ?

## 2021-05-31 MED ORDER — BLOOD GLUCOSE MONITOR KIT: PACK | 0 refills | Status: DC

## 2021-05-31 NOTE — Assessment & Plan Note (Signed)
Check A1c.  Continue metformin 500 mg twice daily. 

## 2021-05-31 NOTE — Assessment & Plan Note (Signed)
I encouraged him to consistently follow-up with his podiatrist.

## 2021-05-31 NOTE — Assessment & Plan Note (Signed)
Adequately controlled.  He will continue HCTZ 25 mg once daily and amlodipine 5 mg once daily.  Check BMP.

## 2021-05-31 NOTE — Patient Instructions (Signed)
Nice to see you. We will get lab work today. Please check and see if you are taking gemtesa for your overactive bladder. You should take the Trelegy daily for your COPD.  You do not need to take the Symbicort.  You can use the albuterol as needed.

## 2021-05-31 NOTE — Assessment & Plan Note (Signed)
Chronic issue.  He will check and see if he is taking [place]  He will continue Flomax and finasteride.  He will continue to see urology.

## 2021-05-31 NOTE — Assessment & Plan Note (Signed)
This is a chronic issue.  I suspect his chronic medical issues are playing a role in this.  Discussed the option to check a B12.  I noted his insurance may not pay for this.  He did sign the ABN.  He was advised of the potential cost of $55 of this test.

## 2021-05-31 NOTE — Progress Notes (Signed)
[name], MD Phone: [phone]  Cody Bridges is a 87 y.o. male who presents today for f/u.  DIABETES Disease Monitoring: Blood Sugar ranges-not checking, he has not been able to get strips for his meter as they do not make them anymore Polyuria/phagia/dipsia- notes polyuria that is chronic      Optho- requesting records Medications: Compliance- taking metformin  HYPERTENSION Disease Monitoring Home BP Monitoring not checking Chest pain- no    Dyspnea- no changes to chronic dyspnea Medications Compliance-  taking HCTZ, amlodipine.  BMET    Component Value Date/Time   NA 136 02/25/2021 1202   K 4.0 02/25/2021 1202   CL 96 02/25/2021 1202   CO2 31 02/25/2021 1202   GLUCOSE 95 02/25/2021 1202   BUN 23 02/25/2021 1202   CREATININE 1.01 02/25/2021 1202   CREATININE 1.06 03/02/2018 1504   CALCIUM 10.1 02/25/2021 1202   GFRNONAA >60 02/16/2021 0457   COPD: Medication compliance- has been taking both trelegy and symbicort  Rescue inhaler use- daily Dyspnea- chronic and stable  Wheezing- occasional  Cough- no  Reports some chronic decreased energy and requests a B12 check.    BPH/overactive bladder: The patient has been seeing urology for this.  He continues to have urinary urgency.  He also notes he urinates every hour or so.  Urology sent in [place] for him though he is unsure if he is taking this.  Social History   Tobacco Use  Smoking Status Former   Packs/day: 2.00   Years: 35.00   Pack years: 70.00   Types: Cigarettes   Quit date: 10/30/1985   Years since quitting: 35.6  Smokeless Tobacco Never    Current Outpatient Medications on File Prior to Visit  Medication Sig Dispense Refill   Accu-Chek FastClix Lancets MISC USE UP TO 4 TIMES DAILY AS DIRECTED  DX:  E11.9 102 each 0   ACCU-CHEK GUIDE test strip USE UP TO 4 TIMES DAILY AS DIRECTED DX: E11.9 100 strip 0   acetaminophen (TYLENOL) 500 MG tablet Take 1,000 mg by mouth every 6 (six) hours as needed for  mild pain or moderate pain.     albuterol (VENTOLIN HFA) 108 (90 Base) MCG/ACT inhaler INHALE 1-2 PUFFS BY MOUTH EVERY 6 HOURS AS NEEDED FOR WHEEZE OR SHORTNESS OF BREATH 18 each 2   amLODipine (NORVASC) 5 MG tablet Take 5 mg by mouth daily.     budesonide-formoterol (SYMBICORT) 160-4.5 MCG/ACT inhaler Inhale 2 puffs into the lungs 2 (two) times daily as needed (for shortness of breath).     calcium carbonate (TUMS - DOSED IN MG ELEMENTAL CALCIUM) 500 MG chewable tablet Chew 1-2 tablets by mouth 3 (three) times daily as needed (for acid reflux/indigestion.).     carboxymethylcellulose (REFRESH PLUS) 0.5 % SOLN Apply 1 drop to eye 3 (three) times daily as needed (dry eyes).     Cholecalciferol (VITAMIN D-3) 5000 units TABS Take 5,000 Units by mouth daily.      DUPIXENT 300 MG/2ML prefilled syringe INJECT 1 SYRINGE UNDER THE SKIN EVERY OTHER WEEK 4 mL 4   Emollient (CERAVE DAILY MOISTURIZING) LOTN Apply 1 application topically daily as needed (dry skin).     finasteride (PROSCAR) 5 MG tablet Take 5 mg by mouth daily.     fluticasone (FLONASE) 50 MCG/ACT nasal spray Place 2 sprays into both nostrils daily.     Fluticasone-Umeclidin-Vilant (TRELEGY ELLIPTA) 100-62.5-25 MCG/INH AEPB Take 1 puff by mouth daily. 120 each 2   guaiFENesin-dextromethorphan (ROBITUSSIN DM) 100-10  MG/5ML syrup Take 10 mLs by mouth every 4 (four) hours as needed for cough. 118 mL 0   hydrochlorothiazide (HYDRODIURIL) 25 MG tablet TAKE 1 TABLET BY MOUTH EVERY DAY 90 tablet 0   loratadine (CLARITIN) 10 MG tablet TAKE 1 TABLET BY MOUTH EVERY DAY 90 tablet 1   metFORMIN (GLUCOPHAGE) 500 MG tablet TAKE 1 TABLET BY MOUTH TWICE A DAY 180 tablet 5   Multiple Vitamin (MULTIVITAMIN WITH MINERALS) TABS tablet Take 1 tablet by mouth daily.     Multiple Vitamins-Minerals (PRESERVISION AREDS PO) Take 1 capsule by mouth in the morning and at bedtime.     rosuvastatin (CRESTOR) 20 MG tablet TAKE 1 TABLET BY MOUTH EVERY DAY 90 tablet 1    tamsulosin (FLOMAX) 0.4 MG CAPS capsule TAKE 1 CAPSULE BY MOUTH EVERY DAY 90 capsule 0   Vibegron (GEMTESA) 75 MG TABS Take 75 mg by mouth daily. 28 tablet 0   Current Facility-Administered Medications on File Prior to Visit  Medication Dose Route Frequency Provider Last Rate Last Admin   dupilumab (DUPIXENT) prefilled syringe 300 mg  300 mg Subcutaneous Q14 Days Ralene Bathe, MD   300 mg at 05/26/21 1040     ROS see history of present illness  Objective  Physical Exam Vitals:   05/31/21 1009  BP: 130/70  Pulse: 99  Temp: 98.6 F (37 C)  SpO2: 99%    BP Readings from Last 3 Encounters:  05/31/21 130/70  02/25/21 138/70  02/16/21 (!) 143/94   Wt Readings from Last 3 Encounters:  05/31/21 201 lb (91.2 kg)  02/25/21 193 lb (87.5 kg)  02/13/21 198 lb 6.6 oz (90 kg)    Physical Exam Constitutional:      General: He is not in acute distress.    Appearance: He is not diaphoretic.  Cardiovascular:     Rate and Rhythm: Normal rate and regular rhythm.     Heart sounds: Normal heart sounds.  Pulmonary:     Effort: Pulmonary effort is normal.     Breath sounds: Normal breath sounds.  Musculoskeletal:     Right lower leg: No edema.     Left lower leg: No edema.  Skin:    General: Skin is warm and dry.  Neurological:     Mental Status: He is alert.     Assessment/Plan: Please see individual problem list.  Problem List Items Addressed This Visit     Benign prostatic hyperplasia with urinary frequency    Chronic issue.  He will check and see if he is taking [place]  He will continue Flomax and finasteride.  He will continue to see urology.      COPD (chronic obstructive pulmonary disease) (HCC)    Chronic and stable.  He sees pulmonology.  He I discussed that he should only be using the Trelegy.  He should not be on the Symbicort anymore.  He can use albuterol as needed for shortness of breath.      Decreased energy    This is a chronic issue.  I suspect his  chronic medical issues are playing a role in this.  Discussed the option to check a B12.  I noted his insurance may not pay for this.  He did sign the ABN.  He was advised of the potential cost of $55 of this test.      Relevant Orders   B12   Diabetes mellitus without complication (HCC) - Primary    Check A1c.  Continue metformin  500 mg twice daily.      Relevant Medications   blood glucose meter kit and supplies KIT   Other Relevant Orders   HgB C6T   Basic Metabolic Panel (BMET)   Foot callus    I encouraged him to consistently follow-up with his podiatrist.      Hypertension    Adequately controlled.  He will continue HCTZ 25 mg once daily and amlodipine 5 mg once daily.  Check BMP.       Return in about 3 months (around 08/31/2021).  This visit occurred during the SARS-CoV-2 public health emergency.  Safety protocols were in place, including screening questions prior to the visit, additional usage of staff PPE, and extensive cleaning of exam room while observing appropriate contact time as indicated for disinfecting solutions.   I have spent 40 minutes in the care of this patient regarding history taking, documentation, completion of exam, discussion of plan, counseling on B12 check, counseling on appropriate use of COPD medications.   [name], MD [place]

## 2021-05-31 NOTE — Assessment & Plan Note (Signed)
Chronic and stable.  He sees pulmonology.  He I discussed that he should only be using the Trelegy.  He should not be on the Symbicort anymore.  He can use albuterol as needed for shortness of breath.

## 2021-06-09 ENCOUNTER — Other Ambulatory Visit: Payer: Self-pay

## 2021-06-09 ENCOUNTER — Ambulatory Visit (INDEPENDENT_AMBULATORY_CARE_PROVIDER_SITE_OTHER): Payer: Medicare Other

## 2021-06-09 DIAGNOSIS — L209 Atopic dermatitis, unspecified: Secondary | ICD-10-CM

## 2021-06-09 NOTE — Progress Notes (Signed)
Patient here for two week Dupixent injection. Dupixent 300mg /44mL injected into right upper arm. Patient tolerated well.   Lot: [id number] EXP: 08/2023

## 2021-06-15 DIAGNOSIS — H353221 Exudative age-related macular degeneration, left eye, with active choroidal neovascularization: Secondary | ICD-10-CM | POA: Diagnosis not present

## 2021-06-22 ENCOUNTER — Other Ambulatory Visit: Payer: Self-pay

## 2021-06-22 ENCOUNTER — Ambulatory Visit (INDEPENDENT_AMBULATORY_CARE_PROVIDER_SITE_OTHER): Payer: Medicare Other

## 2021-06-22 DIAGNOSIS — L209 Atopic dermatitis, unspecified: Secondary | ICD-10-CM | POA: Diagnosis not present

## 2021-06-22 NOTE — Progress Notes (Signed)
Patient here for two week Dupixent injection. Dupixent 300mg /65mL injected into left upper arm. Patient tolerated well.  LOT: [id number] EXP: 09/2023

## 2021-06-29 ENCOUNTER — Other Ambulatory Visit: Payer: Self-pay | Admitting: Family Medicine

## 2021-06-29 DIAGNOSIS — E119 Type 2 diabetes mellitus without complications: Secondary | ICD-10-CM

## 2021-07-07 ENCOUNTER — Ambulatory Visit (INDEPENDENT_AMBULATORY_CARE_PROVIDER_SITE_OTHER): Payer: Medicare Other | Admitting: Dermatology

## 2021-07-07 ENCOUNTER — Other Ambulatory Visit: Payer: Self-pay | Admitting: Family Medicine

## 2021-07-07 ENCOUNTER — Other Ambulatory Visit: Payer: Self-pay

## 2021-07-07 DIAGNOSIS — L2081 Atopic neurodermatitis: Secondary | ICD-10-CM | POA: Diagnosis not present

## 2021-07-07 DIAGNOSIS — L853 Xerosis cutis: Secondary | ICD-10-CM

## 2021-07-07 DIAGNOSIS — L821 Other seborrheic keratosis: Secondary | ICD-10-CM

## 2021-07-07 DIAGNOSIS — E119 Type 2 diabetes mellitus without complications: Secondary | ICD-10-CM

## 2021-07-07 DIAGNOSIS — L578 Other skin changes due to chronic exposure to nonionizing radiation: Secondary | ICD-10-CM

## 2021-07-07 MED ORDER — DUPILUMAB 300 MG/2ML ~~LOC~~ SOSY
300.0000 mg | PREFILLED_SYRINGE | Freq: Once | SUBCUTANEOUS | Status: AC
Start: 2021-07-07 — End: 2021-07-07
  Administered 2021-07-07: 300 mg via SUBCUTANEOUS

## 2021-07-07 NOTE — Patient Instructions (Signed)

## 2021-07-07 NOTE — Progress Notes (Signed)
   Follow-Up Visit   Subjective  Cody Bridges is a 87 y.o. male who presents for the following: Follow-up (Atopic neurodermatitis follow up - Dupixent injection). The patient has spots, moles and lesions to be evaluated, some may be new or changing and the patient has concerns that these could be cancer.  The following portions of the chart were reviewed this encounter and updated as appropriate:   Tobacco  Allergies  Meds  Problems  Med Hx  Surg Hx  Fam Hx     Review of Systems:  No other skin or systemic complaints except as noted in HPI or Assessment and Plan.  Objective  Well appearing patient in no apparent distress; mood and affect are within normal limits.  A focused examination was performed including face, trunk, extremities. Relevant physical exam findings are noted in the Assessment and Plan.  Eyes are clear today. Trunk clear except for xerosis  Xerosis   Assessment & Plan   Actinic Damage - chronic, secondary to cumulative UV radiation exposure/sun exposure over time - diffuse scaly erythematous macules with underlying dyspigmentation - Recommend daily broad spectrum sunscreen SPF 30+ to sun-exposed areas, reapply every 2 hours as needed.  - Recommend staying in the shade or wearing long sleeves, sun glasses (UVA+UVB protection) and wide brim hats (4-inch brim around the entire circumference of the hat). - Call for new or changing lesions.  Seborrheic Keratoses - Stuck-on, waxy, tan-brown papules and/or plaques  - Benign-appearing - Discussed benign etiology and prognosis. 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     Atopic dermatitis - Severe, on Dupixent (biologic medication).  Atopic dermatitis (eczema) is a chronic, relapsing, pruritic condition that can significantly affect quality of life. It is often associated with allergic rhinitis and/or asthma and can  require treatment with topical medications, phototherapy, or in severe cases a biologic medication called Dupixent. Flared today due to recent hospitalization   Continue Dupixent injections q2weeks   Dupixent 300 mg/58ml given today to right upper arm Lot # [id number] Exp 05/2023  dupilumab (DUPIXENT) prefilled syringe 300 mg  Xerosis cutis Continue Cerave cream daily  Return in about 2 weeks (around 07/21/2021) for Injection on Nurse schedule, 2-3 months with Dr. Nehemiah Massed.  I, Ashok Cordia, CMA, am acting as scribe for Sarina Ser, MD . Documentation: I have reviewed the above documentation for accuracy and completeness, and I agree with the above.  Sarina Ser, MD

## 2021-07-16 ENCOUNTER — Encounter: Payer: Self-pay | Admitting: Dermatology

## 2021-08-03 ENCOUNTER — Ambulatory Visit (INDEPENDENT_AMBULATORY_CARE_PROVIDER_SITE_OTHER): Payer: Medicare Other

## 2021-08-03 ENCOUNTER — Other Ambulatory Visit: Payer: Self-pay

## 2021-08-03 DIAGNOSIS — L209 Atopic dermatitis, unspecified: Secondary | ICD-10-CM

## 2021-08-03 NOTE — Progress Notes (Signed)
Patient here for two week Dupixent injection.   Dupixent 300mg /4mL injected SQ into left arm. Patient tolerated well.   LOT: [id number] EXP: 11/2023

## 2021-08-16 ENCOUNTER — Other Ambulatory Visit: Payer: Self-pay | Admitting: Family Medicine

## 2021-08-17 ENCOUNTER — Ambulatory Visit (INDEPENDENT_AMBULATORY_CARE_PROVIDER_SITE_OTHER): Payer: Medicare Other

## 2021-08-17 ENCOUNTER — Other Ambulatory Visit: Payer: Self-pay

## 2021-08-17 DIAGNOSIS — L209 Atopic dermatitis, unspecified: Secondary | ICD-10-CM | POA: Diagnosis not present

## 2021-08-17 NOTE — Progress Notes (Signed)
Patient here today for two week Dupixent injection. Dupixent 300mg /60mL injected into right upper arm. Patient tolerated well.  LOT: [id number] EXP: 11/2023

## 2021-08-31 ENCOUNTER — Ambulatory Visit: Payer: Medicare Other

## 2021-09-04 ENCOUNTER — Other Ambulatory Visit: Payer: Self-pay | Admitting: Family Medicine

## 2021-09-04 DIAGNOSIS — R0981 Nasal congestion: Secondary | ICD-10-CM

## 2021-09-10 ENCOUNTER — Ambulatory Visit (INDEPENDENT_AMBULATORY_CARE_PROVIDER_SITE_OTHER): Payer: Medicare Other | Admitting: Family Medicine

## 2021-09-10 ENCOUNTER — Encounter: Payer: Self-pay | Admitting: Family Medicine

## 2021-09-10 ENCOUNTER — Other Ambulatory Visit: Payer: Self-pay

## 2021-09-10 VITALS — BP 110/70 | HR 106 | Temp 98.7°F | Ht 70.0 in | Wt 202.6 lb

## 2021-09-10 DIAGNOSIS — J309 Allergic rhinitis, unspecified: Secondary | ICD-10-CM

## 2021-09-10 DIAGNOSIS — K4090 Unilateral inguinal hernia, without obstruction or gangrene, not specified as recurrent: Secondary | ICD-10-CM

## 2021-09-10 DIAGNOSIS — J449 Chronic obstructive pulmonary disease, unspecified: Secondary | ICD-10-CM | POA: Diagnosis not present

## 2021-09-10 DIAGNOSIS — I10 Essential (primary) hypertension: Secondary | ICD-10-CM | POA: Diagnosis not present

## 2021-09-10 DIAGNOSIS — E119 Type 2 diabetes mellitus without complications: Secondary | ICD-10-CM

## 2021-09-10 DIAGNOSIS — E782 Mixed hyperlipidemia: Secondary | ICD-10-CM

## 2021-09-10 DIAGNOSIS — R911 Solitary pulmonary nodule: Secondary | ICD-10-CM | POA: Diagnosis not present

## 2021-09-10 MED ORDER — TRELEGY ELLIPTA 100-62.5-25 MCG/ACT IN AEPB: 1.0000 | INHALATION_SPRAY | Freq: Every day | RESPIRATORY_TRACT | 11 refills | Status: DC

## 2021-09-10 MED ORDER — AZELASTINE HCL 0.1 % NA SOLN: 2.0000 | Freq: Two times a day (BID) | NASAL | 12 refills | Status: DC

## 2021-09-10 NOTE — Assessment & Plan Note (Signed)
Noted on prior imaging.  Follow-up CT ordered today.

## 2021-09-10 NOTE — Assessment & Plan Note (Signed)
Patient reports no issues with the hernia.  He will monitor.

## 2021-09-10 NOTE — Assessment & Plan Note (Signed)
Uncontrolled.  He has not been using his Trelegy.  I will refill this.  I will refer back to pulmonology.  He can use his albuterol as needed.

## 2021-09-10 NOTE — Progress Notes (Signed)
[name], MD Phone: [phone]  Cody Bridges is a 88 y.o. male who presents today for f/u.  HYPERTENSION Disease Monitoring Home BP Monitoring not checking Chest pain- no    Dyspnea- chronic and stable related to asthma Medications Compliance-  taking amlodipine, HCTZ.  Edema- no BMET    Component Value Date/Time   NA 140 05/31/2021 1039   K 3.9 05/31/2021 1039   CL 99 05/31/2021 1039   CO2 32 05/31/2021 1039   GLUCOSE 134 (H) 05/31/2021 1039   BUN 30 (H) 05/31/2021 1039   CREATININE 1.11 05/31/2021 1039   CREATININE 1.06 03/02/2018 1504   CALCIUM 9.6 05/31/2021 1039   GFRNONAA >60 02/16/2021 0457   DIABETES Disease Monitoring: Blood Sugar ranges-139 Polyuria/phagia/dipsia- no      Optho- UTD Medications: Compliance- taking metformin Hypoglycemic symptoms- rarely feels week, can't recall what his sugar is when this occurs  COPD: Medication compliance- has not been using trelegy  Rescue inhaler use- frequently uses albuterol Dyspnea- chronic and stable  Wheezing- yes  Cough- no  Allergic rhinitis: Chronic ongoing issues with this.  He mostly has congestion at night.  Uses Flonase and Claritin.    Social History   Tobacco Use  Smoking Status Former   Packs/day: 2.00   Years: 35.00   Pack years: 70.00   Types: Cigarettes   Quit date: 10/30/1985   Years since quitting: 35.8  Smokeless Tobacco Never    Current Outpatient Medications on File Prior to Visit  Medication Sig Dispense Refill   Accu-Chek FastClix Lancets MISC USE UP TO 4 TIMES DAILY AS DIRECTED DX: E11.9 102 each 0   ACCU-CHEK GUIDE test strip USE UP TO 4 TIMES DAILY AS DIRECTED DX: E11.9 100 strip 0   acetaminophen (TYLENOL) 500 MG tablet Take 1,000 mg by mouth every 6 (six) hours as needed for mild pain or moderate pain.     albuterol (VENTOLIN HFA) 108 (90 Base) MCG/ACT inhaler INHALE 1-2 PUFFS BY MOUTH EVERY 6 HOURS AS NEEDED FOR WHEEZE OR SHORTNESS OF BREATH 18 each 2   amLODipine  (NORVASC) 5 MG tablet Take 5 mg by mouth daily.     blood glucose meter kit and supplies KIT Dispense based on patient and insurance preference. Use once daily as directed. (FOR ICD-10  E11.9). 1 each 0   calcium carbonate (TUMS - DOSED IN MG ELEMENTAL CALCIUM) 500 MG chewable tablet Chew 1-2 tablets by mouth 3 (three) times daily as needed (for acid reflux/indigestion.).     carboxymethylcellulose (REFRESH PLUS) 0.5 % SOLN Apply 1 drop to eye 3 (three) times daily as needed (dry eyes).     Cholecalciferol (VITAMIN D-3) 5000 units TABS Take 5,000 Units by mouth daily.      DUPIXENT 300 MG/2ML prefilled syringe INJECT 1 SYRINGE UNDER THE SKIN EVERY OTHER WEEK 4 mL 4   Emollient (CERAVE DAILY MOISTURIZING) LOTN Apply 1 application topically daily as needed (dry skin).     finasteride (PROSCAR) 5 MG tablet Take 5 mg by mouth daily.     fluticasone (FLONASE) 50 MCG/ACT nasal spray Place 2 sprays into both nostrils daily.     guaiFENesin-dextromethorphan (ROBITUSSIN DM) 100-10 MG/5ML syrup Take 10 mLs by mouth every 4 (four) hours as needed for cough. 118 mL 0   hydrochlorothiazide (HYDRODIURIL) 25 MG tablet TAKE 1 TABLET BY MOUTH EVERY DAY 90 tablet 0   loratadine (CLARITIN) 10 MG tablet TAKE 1 TABLET BY MOUTH EVERY DAY (NOT COVERED) 90 tablet 1  metFORMIN (GLUCOPHAGE) 500 MG tablet TAKE 1 TABLET BY MOUTH TWICE A DAY 180 tablet 5   Multiple Vitamin (MULTIVITAMIN WITH MINERALS) TABS tablet Take 1 tablet by mouth daily.     Multiple Vitamins-Minerals (PRESERVISION AREDS PO) Take 1 capsule by mouth in the morning and at bedtime.     rosuvastatin (CRESTOR) 20 MG tablet TAKE 1 TABLET BY MOUTH EVERY DAY 90 tablet 1   tamsulosin (FLOMAX) 0.4 MG CAPS capsule TAKE 1 CAPSULE BY MOUTH EVERY DAY 90 capsule 0   Vibegron (GEMTESA) 75 MG TABS Take 75 mg by mouth daily. 28 tablet 0   Current Facility-Administered Medications on File Prior to Visit  Medication Dose Route Frequency Provider Last Rate Last Admin    dupilumab (DUPIXENT) prefilled syringe 300 mg  300 mg Subcutaneous Q14 Days Ralene Bathe, MD   300 mg at 08/17/21 1109     ROS see history of present illness  Objective  Physical Exam Vitals:   09/10/21 1454  BP: 110/70  Pulse: (!) 106  Temp: 98.7 F (37.1 C)  SpO2: 98%    BP Readings from Last 3 Encounters:  09/10/21 110/70  05/31/21 130/70  02/25/21 138/70   Wt Readings from Last 3 Encounters:  09/10/21 202 lb 9.6 oz (91.9 kg)  05/31/21 201 lb (91.2 kg)  02/25/21 193 lb (87.5 kg)    Physical Exam Constitutional:      General: He is not in acute distress.    Appearance: He is not diaphoretic.  Cardiovascular:     Rate and Rhythm: Normal rate and regular rhythm.     Heart sounds: Normal heart sounds.  Pulmonary:     Effort: Pulmonary effort is normal.     Breath sounds: Normal breath sounds.  Musculoskeletal:     Right lower leg: No edema.     Left lower leg: No edema.  Skin:    General: Skin is warm and dry.  Neurological:     Mental Status: He is alert.     Assessment/Plan: Please see individual problem list.  Problem List Items Addressed This Visit     Allergic rhinitis    He will continue Flonase and Claritin.  We will trial Astelin to see if he gets benefit from that.      Relevant Medications   azelastine (ASTELIN) 0.1 % nasal spray   COPD (chronic obstructive pulmonary disease) (HCC)    Uncontrolled.  He has not been using his Trelegy.  I will refill this.  I will refer back to pulmonology.  He can use his albuterol as needed.      Relevant Medications   azelastine (ASTELIN) 0.1 % nasal spray   Fluticasone-Umeclidin-Vilant (TRELEGY ELLIPTA) 100-62.5-25 MCG/ACT AEPB   Other Relevant Orders   Ambulatory referral to Pulmonology   Diabetes mellitus without complication (HCC)    Check A1c.  Continue metformin 500 mg twice daily.      Relevant Orders   HgB A1c   Hyperlipemia, mixed    Check lipid panel.  Continue Crestor 20 mg daily.       Relevant Orders   Comp Met (CMET)   Lipid panel   Hypertension - Primary    Adequately controlled.  He will continue amlodipine 5 mg once daily and hydrochlorothiazide 25 mg daily.  Check labs.      Lung nodule    Noted on prior imaging.  Follow-up CT ordered today.      Relevant Orders   CT Chest Wo Contrast  Non-recurrent unilateral inguinal hernia without obstruction or gangrene    Patient reports no issues with the hernia.  He will monitor.        Health Maintenance: Patient defers urine microalbumin testing given lack of ability to urinate while in the office.  Return in about 3 months (around 12/08/2021) for COPD.  This visit occurred during the SARS-CoV-2 public health emergency.  Safety protocols were in place, including screening questions prior to the visit, additional usage of staff PPE, and extensive cleaning of exam room while observing appropriate contact time as indicated for disinfecting solutions.    [name], MD [place]

## 2021-09-10 NOTE — Assessment & Plan Note (Signed)
Check lipid panel.  Continue Crestor 20 mg daily. 

## 2021-09-10 NOTE — Assessment & Plan Note (Signed)
He will continue Flonase and Claritin.  We will trial Astelin to see if he gets benefit from that.

## 2021-09-10 NOTE — Assessment & Plan Note (Signed)
Check A1c.  Continue metformin 500 mg twice daily. 

## 2021-09-10 NOTE — Assessment & Plan Note (Signed)
Adequately controlled.  He will continue amlodipine 5 mg once daily and hydrochlorothiazide 25 mg daily.  Check labs.

## 2021-09-10 NOTE — Patient Instructions (Signed)
Nice to see you. Please restart your Trelegy.  I referred you to pulmonology for your COPD. We will start you on Astelin to see if that tarps with your nose. We will get lab work today and contact you with the results.

## 2021-09-11 LAB — COMPREHENSIVE METABOLIC PANEL
AG Ratio: 1.3 (calc) (ref 1.0–2.5)
ALT: 14 U/L (ref 9–46)
AST: 12 U/L (ref 10–35)
Albumin: 4 g/dL (ref 3.6–5.1)
Alkaline phosphatase (APISO): 65 U/L (ref 35–144)
BUN: 24 mg/dL (ref 7–25)
CO2: 32 mmol/L (ref 20–32)
Calcium: 9.4 mg/dL (ref 8.6–10.3)
Chloride: 99 mmol/L (ref 98–110)
Creat: 1.08 mg/dL (ref 0.70–1.22)
Globulin: 3 g/dL (calc) (ref 1.9–3.7)
Glucose, Bld: 103 mg/dL — ABNORMAL HIGH (ref 65–99)
Potassium: 4.1 mmol/L (ref 3.5–5.3)
Sodium: 140 mmol/L (ref 135–146)
Total Bilirubin: 0.4 mg/dL (ref 0.2–1.2)
Total Protein: 7 g/dL (ref 6.1–8.1)

## 2021-09-11 LAB — LIPID PANEL
Cholesterol: 112 mg/dL (ref <200)
HDL: 43 mg/dL (ref >=40)
LDL Cholesterol (Calc): 44 mg/dL (calc)
Non-HDL Cholesterol (Calc): 69 mg/dL (calc) (ref <130)
Total CHOL/HDL Ratio: 2.6 (calc) (ref <5.0)
Triglycerides: 168 mg/dL — ABNORMAL HIGH (ref <150)

## 2021-09-11 LAB — HEMOGLOBIN A1C
Hgb A1c MFr Bld: 6.2 % of total Hgb — ABNORMAL HIGH (ref <5.7)
Mean Plasma Glucose: 131 mg/dL
eAG (mmol/L): 7.3 mmol/L

## 2021-09-20 DIAGNOSIS — E782 Mixed hyperlipidemia: Secondary | ICD-10-CM | POA: Diagnosis not present

## 2021-09-20 DIAGNOSIS — I10 Essential (primary) hypertension: Secondary | ICD-10-CM | POA: Diagnosis not present

## 2021-09-20 DIAGNOSIS — I6523 Occlusion and stenosis of bilateral carotid arteries: Secondary | ICD-10-CM | POA: Diagnosis not present

## 2021-09-20 DIAGNOSIS — R9431 Abnormal electrocardiogram [ECG] [EKG]: Secondary | ICD-10-CM | POA: Diagnosis not present

## 2021-09-20 DIAGNOSIS — J410 Simple chronic bronchitis: Secondary | ICD-10-CM | POA: Diagnosis not present

## 2021-09-20 DIAGNOSIS — I70219 Atherosclerosis of native arteries of extremities with intermittent claudication, unspecified extremity: Secondary | ICD-10-CM | POA: Diagnosis not present

## 2021-09-22 ENCOUNTER — Ambulatory Visit
Admission: RE | Admit: 2021-09-22 | Discharge: 2021-09-22 | Disposition: A | Discharge status: Home / Self Care | Payer: Medicare Other | Source: Ambulatory Visit | Attending: Family Medicine | Admitting: Family Medicine

## 2021-09-22 ENCOUNTER — Other Ambulatory Visit: Payer: Self-pay

## 2021-09-22 DIAGNOSIS — R911 Solitary pulmonary nodule: Secondary | ICD-10-CM | POA: Diagnosis not present

## 2021-09-27 ENCOUNTER — Other Ambulatory Visit: Payer: Self-pay

## 2021-09-27 ENCOUNTER — Telehealth: Payer: Self-pay | Admitting: Family Medicine

## 2021-09-27 DIAGNOSIS — E119 Type 2 diabetes mellitus without complications: Secondary | ICD-10-CM

## 2021-09-27 MED ORDER — ACCU-CHEK GUIDE VI STRP: ORAL_STRIP | 0 refills | Status: DC

## 2021-09-27 MED ORDER — ACCU-CHEK FASTCLIX LANCETS MISC: 0 refills | Status: DC

## 2021-09-27 NOTE — Telephone Encounter (Signed)
Pt need refill on ACCU-CHEK GUIDE test strip and Accu-Chek FastClix Lancets [facility] [place]. Insurance only paying once a day

## 2021-09-27 NOTE — Telephone Encounter (Signed)
Refills sent  to [facility] patient notified.  Lakera Viall,cma

## 2021-09-29 ENCOUNTER — Telehealth: Payer: Self-pay

## 2021-09-29 NOTE — Telephone Encounter (Signed)
Called pt to discuss about CT of chest. Unable to reach nor lvm ?

## 2021-10-04 DIAGNOSIS — H353221 Exudative age-related macular degeneration, left eye, with active choroidal neovascularization: Secondary | ICD-10-CM | POA: Diagnosis not present

## 2021-10-05 DIAGNOSIS — R9431 Abnormal electrocardiogram [ECG] [EKG]: Secondary | ICD-10-CM | POA: Diagnosis not present

## 2021-10-05 DIAGNOSIS — I70219 Atherosclerosis of native arteries of extremities with intermittent claudication, unspecified extremity: Secondary | ICD-10-CM | POA: Diagnosis not present

## 2021-10-12 ENCOUNTER — Other Ambulatory Visit: Payer: Self-pay

## 2021-10-12 ENCOUNTER — Encounter: Payer: Self-pay | Admitting: Dermatology

## 2021-10-12 ENCOUNTER — Ambulatory Visit (INDEPENDENT_AMBULATORY_CARE_PROVIDER_SITE_OTHER): Payer: Medicare Other | Admitting: Dermatology

## 2021-10-12 DIAGNOSIS — L821 Other seborrheic keratosis: Secondary | ICD-10-CM | POA: Diagnosis not present

## 2021-10-12 DIAGNOSIS — L578 Other skin changes due to chronic exposure to nonionizing radiation: Secondary | ICD-10-CM | POA: Diagnosis not present

## 2021-10-12 DIAGNOSIS — L2081 Atopic neurodermatitis: Secondary | ICD-10-CM | POA: Diagnosis not present

## 2021-10-12 DIAGNOSIS — Z79899 Other long term (current) drug therapy: Secondary | ICD-10-CM

## 2021-10-12 MED ORDER — DUPILUMAB 300 MG/2ML ~~LOC~~ SOSY
300.0000 mg | PREFILLED_SYRINGE | Freq: Once | SUBCUTANEOUS | Status: AC
  Administered 2021-10-12: 300 mg via SUBCUTANEOUS

## 2021-10-12 MED ORDER — MOMETASONE FUROATE 0.1 % EX CREA: TOPICAL_CREAM | CUTANEOUS | 1 refills | Status: DC

## 2021-10-12 NOTE — Patient Instructions (Addendum)
Dupilumab (Dupixent) is a treatment given by injection for adults and children with moderate-to-severe atopic dermatitis. Goal is control of skin condition, not cure. It is given as 2 injections at the first dose followed by 1 injection ever 2 weeks thereafter.  Young children are dosed monthly. ? ?Potential side effects include allergic reaction, herpes infections, injection site reactions and conjunctivitis (inflammation of the eyes).  The use of Dupixent requires long term medication management, including periodic office visits.  ? ?Start Mometasone cream twice daily to affected areas up to 2 weeks as needed for rash. Avoid applying to face, groin, and axilla. Use as directed. Long-term use can cause thinning of the skin. ? ?Topical steroids (such as triamcinolone, fluocinolone, fluocinonide, mometasone, clobetasol, halobetasol, betamethasone, hydrocortisone) can cause thinning and lightening of the skin if they are used for too long in the same area. Your physician has selected the right strength medicine for your problem and area affected on the body. Please use your medication only as directed by your physician to prevent side effects.   ? ?Gentle Skin Care Guide ? ?1. Bathe no more than once a day. ? ?2. Avoid bathing in hot water ? ?3. Use a mild soap like Dove, Vanicream, Cetaphil, CeraVe. Can use Lever 2000 or Cetaphil antibacterial soap ? ?4. Use soap only where you need it. On most days, use it under your arms, between your legs, and on your feet. Let the water rinse other areas unless visibly dirty. ? ?5. When you get out of the bath/shower, use a towel to gently blot your skin dry, don't rub it. ? ?6. While your skin is still a little damp, apply a moisturizing cream such as Vanicream, CeraVe, Cetaphil, Eucerin, Sarna lotion or plain Vaseline Jelly. For hands apply Neutrogena [place] Hand Cream or Excipial Hand Cream. ? ?7. Reapply moisturizer any time you start to itch or feel dry. ? ?8. Sometimes  using free and clear laundry detergents can be helpful. Fabric softener sheets should be avoided. Downy Free & Gentle liquid, or any liquid fabric softener that is free of dyes and perfumes, it acceptable to use ? ?9. If your doctor has given you prescription creams you may apply moisturizers over them  ? ? ? ?If You Need Anything After Your Visit ? ?If you have any questions or concerns for your doctor, please call our main line at [phone] and press option 4 to reach your doctor's medical assistant. If no one answers, please leave a voicemail as directed and we will return your call as soon as possible. Messages left after 4 pm will be answered the following business day.  ? ?You may also send [place] a message via MyChart. We typically respond to MyChart messages within 1-2 business days. ? ?For prescription refills, please ask your pharmacy to contact our office. Our fax number is [phone]. ? ?If you have an urgent issue when the clinic is closed that cannot wait until the next business day, you can page your doctor at the number below.   ? ?Please note that while we do our best to be available for urgent issues outside of office hours, we are not available 24/7.  ? ?If you have an urgent issue and are unable to reach [place], you may choose to seek medical care at your doctor's office, retail clinic, urgent care center, or emergency room. ? ?If you have a medical emergency, please immediately call 911 or go to the emergency department. ? ?Pager Numbers ? ?-  Dr. Nehemiah Massed: [phone] ? ?- Dr. Laurence Ferrari: [phone] ? ?- Dr. Nicole Kindred: [phone] ? ?In the event of inclement weather, please call our main line at [phone] for an update on the status of any delays or closures. ? ?Dermatology Medication Tips: ?Please keep the boxes that topical medications come in in order to help keep track of the instructions about where and how to use these. Pharmacies typically print the medication instructions only on the boxes  and not directly on the medication tubes.  ? ?If your medication is too expensive, please contact our office at [phone] option 4 or send [place] a message through [place].  ? ?We are unable to tell what your co-pay for medications will be in advance as this is different depending on your insurance coverage. However, we may be able to find a substitute medication at lower cost or fill out paperwork to get insurance to cover a needed medication.  ? ?If a prior authorization is required to get your medication covered by your insurance company, please allow [place] 1-2 business days to complete this process. ? ?Drug prices often vary depending on where the prescription is filled and some pharmacies may offer cheaper prices. ? ?The website www.goodrx.com contains coupons for medications through different pharmacies. The prices here do not account for what the cost may be with help from insurance (it may be cheaper with your insurance), but the website can give you the price if you did not use any insurance.  ?- You can print the associated coupon and take it with your prescription to the pharmacy.  ?- You may also stop by our office during regular business hours and pick up a GoodRx coupon card.  ?- If you need your prescription sent electronically to a different pharmacy, notify our office through [facility] or by phone at [phone] option 4. ? ? ? ? ?Si Usted Necesita Algo Despu?s de Su Visita ? ?Tambi?n puede enviarnos un mensaje a trav?s de MyChart. Por lo general respondemos a los mensajes de MyChart en el transcurso de 1 a 2 d?as h?biles. ? ?Para renovar recetas, por favor pida a su farmacia que se ponga en contacto con nuestra oficina. Nuestro n?mero de fax es el [phone]. ? ?Si tiene un asunto urgente cuando la cl?nica est? cerrada y que no puede esperar hasta el siguiente d?a h?bil, puede llamar/localizar a su doctor(a) al n?mero que aparece a continuaci?n.  ? ?Por favor, tenga en cuenta que aunque  hacemos todo lo posible para estar disponibles para asuntos urgentes fuera del horario de oficina, no estamos disponibles las 24 horas del d?a, los 7 d?as de la semana.  ? ?Si tiene un problema urgente y no puede comunicarse con nosotros, puede optar por buscar atenci?n m?dica  en el consultorio de su doctor(a), en una cl?nica privada, en un centro de atenci?n urgente o en una sala de emergencias. ? ?Si tiene Engineer, maintenance (IT) m?dica, por favor llame inmediatamente al 911 o vaya a la sala de emergencias. ? ?N?meros de b?per ? ?- Dr. Nehemiah Massed: [phone] ? ?- Dra. Moye: [phone] ? ?- Dra. Nicole Kindred: [phone] ? ?En caso de inclemencias del tiempo, por favor llame a nuestra l?nea principal al [phone] para una actualizaci?n sobre el estado de cualquier retraso o cierre. ? ?Consejos para la medicaci?n en dermatolog?a: ?Por favor, guarde las cajas en las que vienen los medicamentos de uso t?pico para ayudarle a seguir las instrucciones sobre d?nde y c?mo usarlos. [place] instrucciones del medicamento  s?lo en las cajas y no directamente en los tubos del medicamento.  ? ?Si su medicamento es muy caro, por favor, p?ngase en contacto con Zigmund Daniel llamando al [phone] y presione la opci?n 4 o env?enos un mensaje a trav?s de MyChart.  ? ?No podemos decirle cu?l ser? su copago por los medicamentos por adelantado ya que esto es diferente dependiendo de la cobertura de su seguro. Sin embargo, es posible que podamos encontrar un medicamento sustituto a Electrical engineer un formulario para que el seguro cubra el medicamento que se considera necesario.  ? ?Si se requiere Ardelia Mems autorizaci?n previa para que su compa??a de seguros [place] su medicamento, por favor perm?tanos de 1 a 2 d?as h?biles para completar este proceso. ? ?Los precios de los medicamentos var?an con frecuencia dependiendo del Environmental consultant de d?nde se surte la receta y alguna farmacias pueden ofrecer precios m?s  baratos. ? ?El sitio web www.goodrx.com tiene cupones para medicamentos de Airline pilot. Los precios aqu? no tienen en cuenta lo que podr?a costar con la ayuda del seguro (puede ser m?s barato con su segu

## 2021-10-12 NOTE — Progress Notes (Signed)
? ?  Follow-Up Visit ?  ?Subjective  ?Cody Bridges is a 88 y.o. male who presents for the following: Eczema (3 month recheck. Atopic dermatitis. Has improved since on Dupixent injections. Has not had injections since mid January and has flared. ?The patient has spots, moles and lesions to be evaluated, some may be new or changing and the patient has concerns that these could be cancer. ? ?The following portions of the chart were reviewed this encounter and updated as appropriate:  Tobacco  Allergies  Meds  Problems  Med Hx  Surg Hx  Fam Hx   ?  ?Review of Systems: No other skin or systemic complaints except as noted in HPI or Assessment and Plan. ? ?Objective  ?Well appearing patient in no apparent distress; mood and affect are within normal limits. ? ?A focused examination was performed including face, torso, limbs. Relevant physical exam findings are noted in the Assessment and Plan. ? ?torso, arms, legs, head ?Scaly erythematous papules and patches  ? ? ?Assessment & Plan  ? ?Actinic Damage ?- chronic, secondary to cumulative UV radiation exposure/sun exposure over time ?- diffuse scaly erythematous macules with underlying dyspigmentation ?- Recommend daily broad spectrum sunscreen SPF 30+ to sun-exposed areas, reapply every 2 hours as needed.  ?- Recommend staying in the shade or wearing long sleeves, sun glasses (UVA+UVB protection) and wide brim hats (4-inch brim around the entire circumference of the hat). ?- Call for new or changing lesions. ? ?Seborrheic Keratoses ?- Stuck-on, waxy, tan-brown papules and/or plaques  ?- Benign-appearing ?- Discussed benign etiology and prognosis. ?- Observe ?- Call for any changes ? ?Atopic neurodermatitis ?torso, arms, legs, head ?Chronic and persistent condition with duration or expected duration over one year. Condition is bothersome/symptomatic for patient. Currently flared. ?Atopic dermatitis - Severe, on Dupixent (biologic medication).  ?Atopic dermatitis  (eczema) is a chronic, relapsing, pruritic condition that can significantly affect quality of life. It is often associated with allergic rhinitis and/or asthma and can require treatment with topical medications, phototherapy, or in severe cases a biologic medication called Dupixent, which requires long term medication management.   ? ?Continue Dupixent '300mg'$  every 2 weeks.  ? ?Start Mometasone cream twice daily to affected areas up to 2 weeks as needed for rash. Avoid applying to face, groin, and axilla. Use as directed. Long-term use can cause thinning of the skin. ? ?Dupixent '300mg'$  injected into left upper arm. Patient tolerated well.  ? ?mometasone (ELOCON) 0.1 % cream - torso, arms, legs, head ?Apply twice daily to affected areas up to 2 weeks as needed for rash ?Related Medications ?dupilumab (DUPIXENT) prefilled syringe 300 mg ? ?Return in about 2 weeks (around 10/26/2021) for Injection On Nurse Schedule, Atopic dermatitis in 2 months with Dr. Nehemiah Massed. ? ?I, Emelia Salisbury, CMA, am acting as scribe for Sarina Ser, MD. ?Documentation: I have reviewed the above documentation for accuracy and completeness, and I agree with the above. ? ?Sarina Ser, MD ? ? ?

## 2021-10-18 DIAGNOSIS — R9431 Abnormal electrocardiogram [ECG] [EKG]: Secondary | ICD-10-CM | POA: Diagnosis not present

## 2021-10-18 DIAGNOSIS — R55 Syncope and collapse: Secondary | ICD-10-CM | POA: Diagnosis not present

## 2021-10-18 DIAGNOSIS — I10 Essential (primary) hypertension: Secondary | ICD-10-CM | POA: Diagnosis not present

## 2021-10-18 DIAGNOSIS — I70219 Atherosclerosis of native arteries of extremities with intermittent claudication, unspecified extremity: Secondary | ICD-10-CM | POA: Diagnosis not present

## 2021-10-18 DIAGNOSIS — E785 Hyperlipidemia, unspecified: Secondary | ICD-10-CM | POA: Diagnosis not present

## 2021-10-18 DIAGNOSIS — I6523 Occlusion and stenosis of bilateral carotid arteries: Secondary | ICD-10-CM | POA: Diagnosis not present

## 2021-10-20 ENCOUNTER — Other Ambulatory Visit: Payer: Self-pay | Admitting: Family Medicine

## 2021-10-20 DIAGNOSIS — E119 Type 2 diabetes mellitus without complications: Secondary | ICD-10-CM

## 2021-10-22 ENCOUNTER — Encounter: Payer: Self-pay | Admitting: Dermatology

## 2021-10-25 DIAGNOSIS — I70219 Atherosclerosis of native arteries of extremities with intermittent claudication, unspecified extremity: Secondary | ICD-10-CM | POA: Diagnosis not present

## 2021-10-25 DIAGNOSIS — E782 Mixed hyperlipidemia: Secondary | ICD-10-CM | POA: Diagnosis not present

## 2021-10-25 DIAGNOSIS — I6523 Occlusion and stenosis of bilateral carotid arteries: Secondary | ICD-10-CM | POA: Diagnosis not present

## 2021-10-25 DIAGNOSIS — I10 Essential (primary) hypertension: Secondary | ICD-10-CM | POA: Diagnosis not present

## 2021-10-26 ENCOUNTER — Other Ambulatory Visit: Payer: Self-pay

## 2021-10-26 ENCOUNTER — Ambulatory Visit (INDEPENDENT_AMBULATORY_CARE_PROVIDER_SITE_OTHER): Payer: Medicare Other | Admitting: Dermatology

## 2021-10-26 DIAGNOSIS — L2089 Other atopic dermatitis: Secondary | ICD-10-CM

## 2021-10-26 NOTE — Progress Notes (Signed)
Patient here today for two week Dupixent injections for atopic dermatitis. Dupixent '300mg'$ /46m injected into the right upper arm. Patient tolerated well.  ? ?LOT: [id number]?EXP: 11/2022 ? ?AJohnsie Kindred RMA ?Documentation: I have reviewed the above documentation for accuracy and completeness, and I agree with the above. ? ?DSarina Ser MD ? ?

## 2021-10-27 ENCOUNTER — Telehealth: Payer: Self-pay

## 2021-10-28 ENCOUNTER — Encounter: Payer: Self-pay | Admitting: Dermatology

## 2021-10-28 DIAGNOSIS — E1142 Type 2 diabetes mellitus with diabetic polyneuropathy: Secondary | ICD-10-CM | POA: Diagnosis not present

## 2021-10-28 DIAGNOSIS — B351 Tinea unguium: Secondary | ICD-10-CM | POA: Diagnosis not present

## 2021-11-09 ENCOUNTER — Ambulatory Visit: Payer: Medicare Other

## 2021-11-11 ENCOUNTER — Ambulatory Visit (INDEPENDENT_AMBULATORY_CARE_PROVIDER_SITE_OTHER): Payer: Medicare Other | Admitting: Dermatology

## 2021-11-11 DIAGNOSIS — L2089 Other atopic dermatitis: Secondary | ICD-10-CM

## 2021-11-11 DIAGNOSIS — L209 Atopic dermatitis, unspecified: Secondary | ICD-10-CM

## 2021-11-11 NOTE — Progress Notes (Signed)
Patient here today for Dupixent injection for severe atopic dermatitis. Dupixent '300mg'$ /69m injected into the left upper arm. Patient tolerated well. ? ?LOT: [id number] ?EXP: 03/2024 ? ?AJohnsie Kindred RMA ?Documentation: I have reviewed the above documentation for accuracy and completeness, and I agree with the above. ? ?DSarina Ser MD ? ?

## 2021-11-15 ENCOUNTER — Other Ambulatory Visit: Payer: Self-pay | Admitting: Dermatology

## 2021-11-15 ENCOUNTER — Ambulatory Visit (INDEPENDENT_AMBULATORY_CARE_PROVIDER_SITE_OTHER): Payer: Medicare Other | Admitting: Internal Medicine

## 2021-11-15 ENCOUNTER — Telehealth: Payer: Self-pay | Admitting: Family Medicine

## 2021-11-15 ENCOUNTER — Encounter: Payer: Self-pay | Admitting: Internal Medicine

## 2021-11-15 DIAGNOSIS — Z20822 Contact with and (suspected) exposure to covid-19: Secondary | ICD-10-CM | POA: Insufficient documentation

## 2021-11-15 DIAGNOSIS — J449 Chronic obstructive pulmonary disease, unspecified: Secondary | ICD-10-CM

## 2021-11-15 DIAGNOSIS — L2081 Atopic neurodermatitis: Secondary | ICD-10-CM

## 2021-11-15 MED ORDER — ALBUTEROL SULFATE HFA 108 (90 BASE) MCG/ACT IN AERS: INHALATION_SPRAY | RESPIRATORY_TRACT | 2 refills | Status: DC

## 2021-11-15 MED ORDER — BENZONATATE 200 MG PO CAPS: 200.0000 mg | ORAL_CAPSULE | Freq: Three times a day (TID) | ORAL | 1 refills | Status: DC | PRN

## 2021-11-15 MED ORDER — PREDNISONE 10 MG PO TABS: ORAL_TABLET | ORAL | 0 refills | Status: DC

## 2021-11-15 MED ORDER — AZITHROMYCIN 500 MG PO TABS: 500.0000 mg | ORAL_TABLET | Freq: Every day | ORAL | 0 refills | Status: DC

## 2021-11-15 MED ORDER — MOLNUPIRAVIR EUA 200MG CAPSULE: 4.0000 | ORAL_CAPSULE | Freq: Two times a day (BID) | ORAL | 0 refills | Status: AC

## 2021-11-15 NOTE — Progress Notes (Signed)
Telephone  Note ? ?This visit type was conducted due to national recommendations for restrictions regarding the COVID-19 pandemic (e.g. social distancing).  This format is felt to be most appropriate for this patient at this time.  All issues noted in this document were discussed and addressed.  No physical exam was performed (except for noted visual exam findings with Video Visits).  ? ?I connected withNAME@ on 11/15/21 at 11:30 AM EDT by  telephone and verified that I am speaking with the correct person using two identifiers. ?Location patient: home ?Location provider: work or home office ?Persons participating in the virtual visit: patient, provider ? ?I discussed the limitations, risks, security and privacy concerns of performing an evaluation and management service by telephone and the availability of in person appointments. I also discussed with the patient that there may be a patient responsible charge related to this service. The patient expressed understanding and agreed to proceed. ? ? ?Reason for visit: cough, shortness of breath,  recent COVID exposure ? ?HPI: ? 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  Recently travelled to [place] on April 11,  wife was treated last week for same symptoms and molnupiravir added for faintly positive POC COVID test.  She is improving . ? ?He denies chest pain ?ROS: See pertinent positives and negatives per HPI. ? ?Past Medical History:  ?Diagnosis Date  ? Arthritis   ? Asthma   ? COPD (chronic obstructive pulmonary disease) ([place])   ? COVID-19 02/13/2021  ? Diabetes mellitus without complication ([place])   ?  2015  ? Dyspnea   ? with exertion  ? Edema   ? GERD (gastroesophageal reflux disease)   ? HOH (hard of hearing)   ? AIDS  ? Hyperlipidemia   ? Hypertension   ? Wheezing   ? ? ?Past Surgical History:   ?Procedure Laterality Date  ? BUNIONECTOMY Right   ? CATARACT EXTRACTION W/PHACO Left 08/22/2017  ? Procedure: CATARACT EXTRACTION PHACO AND INTRAOCULAR LENS PLACEMENT (IOC);  Surgeon: Birder Robson, MD;  Location: [facility] ORS;  Service: Ophthalmology;  Laterality: Left;  [place] 00:43.7 ?AP% 12.3 ?CDE 5.38 ?Fluid pack lot # [id number]  ? COLONOSCOPY  2016  ? ELECTROMAGNETIC NAVIGATION BROCHOSCOPY N/A 02/09/2017  ? Procedure: ELECTROMAGNETIC NAVIGATION BRONCHOSCOPY;  Surgeon: Flora Lipps, MD;  Location: [facility] ORS;  Service: Cardiopulmonary;  Laterality: N/A;  ? EYE SURGERY Right   ? Cataract Extraction with IOL  ? HAMMER TOE SURGERY Right   ? TONSILLECTOMY  1938  ? ? ?Family History  ?Problem Relation Age of Onset  ? Cancer Mother   ? Stomach cancer Mother   ? Stomach cancer Sister   ? ? ?SOCIAL HX:  reports that he quit smoking about 36 years ago. His smoking use included cigarettes. He has a 70.00 pack-year smoking history. He has never used smokeless tobacco. He reports current alcohol use. He reports that he does not use drugs.  ? ? ?Current Outpatient Medications:  ?  Accu-Chek FastClix Lancets MISC, Use up to Once daily as Directed  DX:  E11.9, Disp: 102 each, Rfl: 0 ?  acetaminophen (TYLENOL) 500 MG tablet, Take 1,000 mg by mouth every 6 (six) hours as needed for mild pain or moderate pain., Disp: , Rfl:  ?  amLODipine (NORVASC) 5 MG tablet, Take 5 mg by  mouth daily., Disp: , Rfl:  ?  azelastine (ASTELIN) 0.1 % nasal spray, Place 2 sprays into both nostrils 2 (two) times daily. Use in each nostril as directed, Disp: 30 mL, Rfl: 12 ?  azithromycin (ZITHROMAX) 500 MG tablet, Take 1 tablet (500 mg total) by mouth daily., Disp: 7 tablet, Rfl: 0 ?  benzonatate (TESSALON) 200 MG capsule, Take 1 capsule (200 mg total) by mouth 3 (three) times daily as needed for cough., Disp: 60 capsule, Rfl: 1 ?  blood glucose meter kit and supplies KIT, Dispense based on patient and insurance preference. Use once daily as directed. (FOR  ICD-10  E11.9)., Disp: 1 each, Rfl: 0 ?  calcium carbonate (TUMS - DOSED IN MG ELEMENTAL CALCIUM) 500 MG chewable tablet, Chew 1-2 tablets by mouth 3 (three) times daily as needed (for acid reflux/indigestion.)., Disp: , Rfl:  ?  carboxymethylcellulose (REFRESH PLUS) 0.5 % SOLN, Apply 1 drop to eye 3 (three) times daily as needed (dry eyes)., Disp: , Rfl:  ?  Cholecalciferol (VITAMIN D-3) 5000 units TABS, Take 5,000 Units by mouth daily. , Disp: , Rfl:  ?  DUPIXENT 300 MG/2ML prefilled syringe, INJECT 1 SYRINGE UNDER THE SKIN EVERY OTHER WEEK, Disp: 4 mL, Rfl: 4 ?  Emollient (CERAVE DAILY MOISTURIZING) LOTN, Apply 1 application topically daily as needed (dry skin)., Disp: , Rfl:  ?  finasteride (PROSCAR) 5 MG tablet, Take 5 mg by mouth daily., Disp: , Rfl:  ?  fluticasone (FLONASE) 50 MCG/ACT nasal spray, Place 2 sprays into both nostrils daily., Disp: , Rfl:  ?  Fluticasone-Umeclidin-Vilant (TRELEGY ELLIPTA) 100-62.5-25 MCG/ACT AEPB, Inhale 1 puff into the lungs daily., Disp: 1 each, Rfl: 11 ?  glucose blood (ACCU-CHEK GUIDE) test strip, USE UP TO 4 TIMES DAILY AS DIRECTED DX: E11.9, Disp: 100 strip, Rfl: 0 ?  guaiFENesin-dextromethorphan (ROBITUSSIN DM) 100-10 MG/5ML syrup, Take 10 mLs by mouth every 4 (four) hours as needed for cough., Disp: 118 mL, Rfl: 0 ?  hydrochlorothiazide (HYDRODIURIL) 25 MG tablet, TAKE 1 TABLET BY MOUTH EVERY DAY, Disp: 90 tablet, Rfl: 0 ?  loratadine (CLARITIN) 10 MG tablet, TAKE 1 TABLET BY MOUTH EVERY DAY (NOT COVERED), Disp: 90 tablet, Rfl: 1 ?  metFORMIN (GLUCOPHAGE) 500 MG tablet, TAKE 1 TABLET BY MOUTH TWICE A DAY, Disp: 180 tablet, Rfl: 5 ?  molnupiravir EUA (LAGEVRIO) 200 mg CAPS capsule, Take 4 capsules (800 mg total) by mouth 2 (two) times daily for 5 days., Disp: 40 capsule, Rfl: 0 ?  mometasone (ELOCON) 0.1 % cream, APPLY TWICE DAILY TO AFFECTED AREAS UP TO 2 WEEKS AS NEEDED FOR RASH, Disp: 45 g, Rfl: 1 ?  Multiple Vitamin (MULTIVITAMIN WITH MINERALS) TABS tablet, Take 1  tablet by mouth daily., Disp: , Rfl:  ?  Multiple Vitamins-Minerals (PRESERVISION AREDS PO), Take 1 capsule by mouth in the morning and at bedtime., Disp: , Rfl:  ?  predniSONE (DELTASONE) 10 MG tablet, 6 tablets on Day 1 , then reduce by 1 tablet daily until gone, Disp: 21 tablet, Rfl: 0 ?  rosuvastatin (CRESTOR) 20 MG tablet, TAKE 1 TABLET BY MOUTH EVERY DAY, Disp: 90 tablet, Rfl: 1 ?  tamsulosin (FLOMAX) 0.4 MG CAPS capsule, TAKE 1 CAPSULE BY MOUTH EVERY DAY, Disp: 90 capsule, Rfl: 0 ?  Vibegron (GEMTESA) 75 MG TABS, Take 75 mg by mouth daily., Disp: 28 tablet, Rfl: 0 ?  albuterol (VENTOLIN HFA) 108 (90 Base) MCG/ACT inhaler, INHALE 1-2 PUFFS BY MOUTH EVERY 6 HOURS AS NEEDED FOR WHEEZE  OR SHORTNESS OF BREATH, Disp: 18 each, Rfl: 2 ? ?Current Facility-Administered Medications:  ?  dupilumab (DUPIXENT) prefilled syringe 300 mg, 300 mg, Subcutaneous, Q14 Days, Ralene Bathe, MD, 300 mg at 11/11/21 1036 ? ?EXAM: ? ? ?General impression: alert, cooperative and articulate.  No signs of being in distress  ?Lungs: speech is fluent sentence length suggests that patient is not short of breath and not punctuated by cough, sneezing or sniffing. Marland Kitchen   ?Psych: affect normal.  speech is articulate and non pressured .  Denies suicidal thoughts   ? ?ASSESSMENT AND PLAN: ? ?Discussed the following assessment and plan: ? ?Chronic obstructive pulmonary disease, unspecified COPD type ([place]) ? ?Suspected COVID-19 virus infection ? ?COPD (chronic obstructive pulmonary disease) ([place]) ?Aggravated by possible COVID infection.  Prednisone taper, azithromycin albuterol refilled,  Tessalon perles for cough,  And molnupirair 800 mg bid .  Sees pulmonology on April 20  ? ?Suspected COVID-19 virus infection ?Treating with molnupiravir given current symptoms and wife's diagnosis last week by POC testing  ? ?  ?I discussed the assessment and treatment plan with the patient. The patient was provided an opportunity to ask questions and all were  answered. The patient agreed with the plan and demonstrated an understanding of the instructions. ?  ?The patient was advised to call back or seek an in-person evaluation if the symptoms worsen or if the condit

## 2021-11-15 NOTE — Assessment & Plan Note (Signed)
Aggravated by possible COVID infection.  Prednisone taper, azithromycin albuterol refilled,  Tessalon perles for cough,  And molnupirair 800 mg bid .  Sees pulmonology on April 20  ?

## 2021-11-15 NOTE — Assessment & Plan Note (Signed)
Treating with molnupiravir given current symptoms and wife's diagnosis last week by POC testing  ?

## 2021-11-15 NOTE — Telephone Encounter (Signed)
Patient requesting refill on  albuterol (VENTOLIN HFA) 108 (90 Base) MCG/ACT inhaler. ?

## 2021-11-18 ENCOUNTER — Institutional Professional Consult (permissible substitution): Payer: Medicare Other | Admitting: Pulmonary Disease

## 2021-11-19 ENCOUNTER — Encounter: Payer: Self-pay | Admitting: Dermatology

## 2021-11-25 ENCOUNTER — Ambulatory Visit (INDEPENDENT_AMBULATORY_CARE_PROVIDER_SITE_OTHER): Payer: Medicare Other | Admitting: Dermatology

## 2021-11-25 DIAGNOSIS — L209 Atopic dermatitis, unspecified: Secondary | ICD-10-CM | POA: Diagnosis not present

## 2021-11-25 NOTE — Progress Notes (Signed)
Patient here today for two week Dupixent injection for severe atopic dermatitis. Dupixent '300mg'$ /84m injected into right upper arm. Patient tolerated well.  ? ?LOT: [id number]?EXP: 09/2023 ? ?AJohnsie Kindred RMA ?Documentation: I have reviewed the above documentation for accuracy and completeness, and I agree with the above. ? ?DSarina Ser MD ? ?

## 2021-11-30 ENCOUNTER — Telehealth: Payer: Self-pay | Admitting: Family Medicine

## 2021-11-30 ENCOUNTER — Other Ambulatory Visit: Payer: Self-pay | Admitting: Dermatology

## 2021-11-30 NOTE — Telephone Encounter (Signed)
Pt friend called in requesting refill on medication (glucose blood (ACCU-CHEK GUIDE) test strip) and (Accu-Chek FastClix Lancets MISC)... Pt stated if Dr. Caryl Bis can put on script once a day because medicare will not cover anything else beyond that... Pt is requesting for Scripts to be sent to [facility]... Pt requesting callback...  ?

## 2021-12-01 ENCOUNTER — Other Ambulatory Visit: Payer: Self-pay

## 2021-12-01 DIAGNOSIS — E119 Type 2 diabetes mellitus without complications: Secondary | ICD-10-CM

## 2021-12-01 MED ORDER — ACCU-CHEK FASTCLIX LANCETS MISC: 0 refills | Status: AC

## 2021-12-01 MED ORDER — ACCU-CHEK GUIDE VI STRP: ORAL_STRIP | 0 refills | Status: DC

## 2021-12-07 ENCOUNTER — Encounter: Payer: Self-pay | Admitting: Dermatology

## 2021-12-10 ENCOUNTER — Ambulatory Visit (INDEPENDENT_AMBULATORY_CARE_PROVIDER_SITE_OTHER): Payer: Medicare Other | Admitting: Family Medicine

## 2021-12-10 ENCOUNTER — Encounter: Payer: Self-pay | Admitting: Family Medicine

## 2021-12-10 VITALS — BP 123/70 | HR 99 | Temp 98.0°F | Ht 64.0 in | Wt 204.8 lb

## 2021-12-10 DIAGNOSIS — N401 Enlarged prostate with lower urinary tract symptoms: Secondary | ICD-10-CM | POA: Diagnosis not present

## 2021-12-10 DIAGNOSIS — R350 Frequency of micturition: Secondary | ICD-10-CM

## 2021-12-10 DIAGNOSIS — Z23 Encounter for immunization: Secondary | ICD-10-CM | POA: Diagnosis not present

## 2021-12-10 DIAGNOSIS — E119 Type 2 diabetes mellitus without complications: Secondary | ICD-10-CM

## 2021-12-10 DIAGNOSIS — J449 Chronic obstructive pulmonary disease, unspecified: Secondary | ICD-10-CM

## 2021-12-10 DIAGNOSIS — I10 Essential (primary) hypertension: Secondary | ICD-10-CM | POA: Diagnosis not present

## 2021-12-10 LAB — POCT GLYCOSYLATED HEMOGLOBIN (HGB A1C): Hemoglobin A1C: 6.8 % — AB (ref 4.0–5.6)

## 2021-12-10 LAB — MICROALBUMIN / CREATININE URINE RATIO
Creatinine,U: 59.7 mg/dL
Microalb Creat Ratio: 4.9 mg/g (ref 0.0–30.0)
Microalb, Ur: 2.9 mg/dL — ABNORMAL HIGH (ref 0.0–1.9)

## 2021-12-10 MED ORDER — ALBUTEROL SULFATE HFA 108 (90 BASE) MCG/ACT IN AERS: INHALATION_SPRAY | RESPIRATORY_TRACT | 2 refills | Status: DC

## 2021-12-10 NOTE — Assessment & Plan Note (Signed)
Check A1c.  Continue metformin 500 mg twice daily. 

## 2021-12-10 NOTE — Progress Notes (Signed)
?[name], MD ?Phone: [phone] ? ?Cody Bridges is a 88 y.o. male who presents today for f/u. ? ?HYPERTENSION ?Disease Monitoring ?Home BP Monitoring "not high" though does not remember the numbers Chest pain- no    Dyspnea- see below ?Medications ?Compliance-  taking amlodipine, HCTZ.  Edema- no ?BMET ?   ?Component Value Date/Time  ? NA 140 09/10/2021 1511  ? K 4.1 09/10/2021 1511  ? CL 99 09/10/2021 1511  ? CO2 32 09/10/2021 1511  ? GLUCOSE 103 (H) 09/10/2021 1511  ? BUN 24 09/10/2021 1511  ? CREATININE 1.08 09/10/2021 1511  ? CALCIUM 9.4 09/10/2021 1511  ? GFRNONAA >60 02/16/2021 0457  ? ?DIABETES ?Disease Monitoring: ?Blood Sugar ranges-122 this morning Polyuria/phagia/dipsia-some chronic polyuria Optho- UTD ?Medications: ?Compliance- taking metformin Hypoglycemic symptoms- no ? ?COPD/COVID-19 infection: Patient was recently treated for COVID-19 infection and COPD exacerbation.  He notes he has recovered well from this.  He does report chronic dyspnea that has progressively been worsening even prior to getting COVID.  He notes somewhat worsened since COVID.  He notes no chest pain.  Minimal cough and wheezing.  He does take Trelegy which helps some.  He needs a refill on his albuterol.  He had to reschedule his pulmonology visit and is currently scheduled for late June. ? ? ?Social History  ? ?Tobacco Use  ?Smoking Status Former  ? Packs/day: 2.00  ? Years: 35.00  ? Pack years: 70.00  ? Types: Cigarettes  ? Quit date: 10/30/1985  ? Years since quitting: 36.1  ?Smokeless Tobacco Never  ? ? ?Current Outpatient Medications on File Prior to Visit  ?Medication Sig Dispense Refill  ? Accu-Chek FastClix Lancets MISC Use up to Once daily as Directed  DX:  E11.9 102 each 0  ? acetaminophen (TYLENOL) 500 MG tablet Take 1,000 mg by mouth every 6 (six) hours as needed for mild pain or moderate pain.    ? amLODipine (NORVASC) 5 MG tablet Take 5 mg by mouth daily.    ? azelastine (ASTELIN) 0.1 % nasal spray Place  2 sprays into both nostrils 2 (two) times daily. Use in each nostril as directed 30 mL 12  ? azithromycin (ZITHROMAX) 500 MG tablet Take 1 tablet (500 mg total) by mouth daily. 7 tablet 0  ? benzonatate (TESSALON) 200 MG capsule Take 1 capsule (200 mg total) by mouth 3 (three) times daily as needed for cough. 60 capsule 1  ? blood glucose meter kit and supplies KIT Dispense based on patient and insurance preference. Use once daily as directed. (FOR ICD-10  E11.9). 1 each 0  ? calcium carbonate (TUMS - DOSED IN MG ELEMENTAL CALCIUM) 500 MG chewable tablet Chew 1-2 tablets by mouth 3 (three) times daily as needed (for acid reflux/indigestion.).    ? carboxymethylcellulose (REFRESH PLUS) 0.5 % SOLN Apply 1 drop to eye 3 (three) times daily as needed (dry eyes).    ? Cholecalciferol (VITAMIN D-3) 5000 units TABS Take 5,000 Units by mouth daily.     ? DUPIXENT 300 MG/2ML prefilled syringe INJECT 1 SYRINGE UNDER THE SKIN EVERY OTHER WEEK 4 mL 6  ? Emollient (CERAVE DAILY MOISTURIZING) LOTN Apply 1 application topically daily as needed (dry skin).    ? finasteride (PROSCAR) 5 MG tablet Take 5 mg by mouth daily.    ? fluticasone (FLONASE) 50 MCG/ACT nasal spray Place 2 sprays into both nostrils daily.    ? Fluticasone-Umeclidin-Vilant (TRELEGY ELLIPTA) 100-62.5-25 MCG/ACT AEPB Inhale 1 puff into the lungs daily.  1 each 11  ? glucose blood (ACCU-CHEK GUIDE) test strip USE UP TO ONCE DAILY AS DIRECTED DX: E11.9 100 strip 0  ? guaiFENesin-dextromethorphan (ROBITUSSIN DM) 100-10 MG/5ML syrup Take 10 mLs by mouth every 4 (four) hours as needed for cough. 118 mL 0  ? hydrochlorothiazide (HYDRODIURIL) 25 MG tablet TAKE 1 TABLET BY MOUTH EVERY DAY 90 tablet 0  ? loratadine (CLARITIN) 10 MG tablet TAKE 1 TABLET BY MOUTH EVERY DAY (NOT COVERED) 90 tablet 1  ? metFORMIN (GLUCOPHAGE) 500 MG tablet TAKE 1 TABLET BY MOUTH TWICE A DAY 180 tablet 5  ? mometasone (ELOCON) 0.1 % cream APPLY TWICE DAILY TO AFFECTED AREAS UP TO 2 WEEKS AS  NEEDED FOR RASH 45 g 1  ? Multiple Vitamin (MULTIVITAMIN WITH MINERALS) TABS tablet Take 1 tablet by mouth daily.    ? Multiple Vitamins-Minerals (PRESERVISION AREDS PO) Take 1 capsule by mouth in the morning and at bedtime.    ? predniSONE (DELTASONE) 10 MG tablet 6 tablets on Day 1 , then reduce by 1 tablet daily until gone 21 tablet 0  ? rosuvastatin (CRESTOR) 20 MG tablet TAKE 1 TABLET BY MOUTH EVERY DAY 90 tablet 1  ? tamsulosin (FLOMAX) 0.4 MG CAPS capsule TAKE 1 CAPSULE BY MOUTH EVERY DAY 90 capsule 0  ? Vibegron (GEMTESA) 75 MG TABS Take 75 mg by mouth daily. 28 tablet 0  ? ?Current Facility-Administered Medications on File Prior to Visit  ?Medication Dose Route Frequency Provider Last Rate Last Admin  ? dupilumab (DUPIXENT) prefilled syringe 300 mg  300 mg Subcutaneous Q14 Days Ralene Bathe, MD   300 mg at 11/25/21 1348  ? ? ? ?ROS see history of present illness ? ?Objective ? ?Physical Exam ?Vitals:  ? 12/10/21 1023  ?BP: 123/70  ?Pulse: 99  ?Temp: 98 ?F (36.7 ?C)  ?SpO2: 99%  ? ? ?BP Readings from Last 3 Encounters:  ?12/10/21 123/70  ?09/10/21 110/70  ?05/31/21 130/70  ? ?Wt Readings from Last 3 Encounters:  ?12/10/21 204 lb 12.8 oz (92.9 kg)  ?11/15/21 202 lb 9.6 oz (91.9 kg)  ?09/10/21 202 lb 9.6 oz (91.9 kg)  ? ? ?Physical Exam ?Constitutional:   ?   General: He is not in acute distress. ?   Appearance: He is not diaphoretic.  ?Cardiovascular:  ?   Rate and Rhythm: Normal rate and regular rhythm.  ?   Heart sounds: Normal heart sounds.  ?Pulmonary:  ?   Effort: Pulmonary effort is normal.  ?   Breath sounds: Normal breath sounds.  ?Musculoskeletal:  ?   Right lower leg: No edema.  ?   Left lower leg: No edema.  ?Skin: ?   General: Skin is warm and dry.  ?Neurological:  ?   Mental Status: He is alert.  ? ? ? ?Assessment/Plan: Please see individual problem list. ? ?Problem List Items Addressed This Visit   ? ? COPD (chronic obstructive pulmonary disease) (HCC) (Chronic)  ?  Chronic issue.  Has had  progressive shortness of breath.  He will continue Trelegy and as needed albuterol.  He will see pulmonology as planned.  Prevnar 20 given today. ? ?  ?  ? Relevant Medications  ? albuterol (VENTOLIN HFA) 108 (90 Base) MCG/ACT inhaler  ? Diabetes mellitus without complication (HCC) (Chronic)  ?  Check A1c.  Continue metformin 500 mg twice daily. ? ?  ?  ? Relevant Orders  ? POCT HgB A1C (Completed)  ? Urine Microalbumin w/creat. ratio  ?  Hypertension (Chronic)  ?  Adequately controlled.  He will continue HCTZ 25 mg daily and amlodipine 5 mg daily. ? ?  ?  ? Benign prostatic hyperplasia with urinary frequency  ?  Chronic polyuria.  He will continue Gemtesa 75 mg daily and Flomax 0.4 mg daily and finasteride 5 mg daily.  He will continue to see urology. ? ?  ?  ? ?Other Visit Diagnoses   ? ? Need for pneumococcal vaccination    -  Primary  ? Relevant Orders  ? Pneumococcal conjugate vaccine 20-valent (Prevnar 20) (Completed)  ? ?  ? ?Return in about 6 months (around 06/12/2022) for Diabetes/hypertension. ? ? ?[name], MD ?[place] ? ?

## 2021-12-10 NOTE — Patient Instructions (Signed)
Nice to see you. ?Please see pulmonology as planned. ?

## 2021-12-10 NOTE — Assessment & Plan Note (Signed)
Chronic polyuria.  He will continue Gemtesa 75 mg daily and Flomax 0.4 mg daily and finasteride 5 mg daily.  He will continue to see urology. ?

## 2021-12-10 NOTE — Assessment & Plan Note (Addendum)
Chronic issue.  Has had progressive shortness of breath.  He will continue Trelegy and as needed albuterol.  He will see pulmonology as planned.  Prevnar 20 given today. ?

## 2021-12-10 NOTE — Assessment & Plan Note (Signed)
Adequately controlled.  He will continue HCTZ 25 mg daily and amlodipine 5 mg daily. 

## 2021-12-14 ENCOUNTER — Telehealth: Payer: Self-pay

## 2021-12-15 ENCOUNTER — Ambulatory Visit (INDEPENDENT_AMBULATORY_CARE_PROVIDER_SITE_OTHER): Payer: Medicare Other | Admitting: Dermatology

## 2021-12-15 DIAGNOSIS — L853 Xerosis cutis: Secondary | ICD-10-CM | POA: Diagnosis not present

## 2021-12-15 DIAGNOSIS — L570 Actinic keratosis: Secondary | ICD-10-CM

## 2021-12-15 DIAGNOSIS — L578 Other skin changes due to chronic exposure to nonionizing radiation: Secondary | ICD-10-CM

## 2021-12-15 DIAGNOSIS — L2089 Other atopic dermatitis: Secondary | ICD-10-CM | POA: Diagnosis not present

## 2021-12-15 DIAGNOSIS — L820 Inflamed seborrheic keratosis: Secondary | ICD-10-CM

## 2021-12-15 MED ORDER — DUPILUMAB 300 MG/2ML ~~LOC~~ SOSY
300.0000 mg | PREFILLED_SYRINGE | Freq: Once | SUBCUTANEOUS | Status: AC
  Administered 2021-12-15: 300 mg via SUBCUTANEOUS

## 2021-12-15 NOTE — Patient Instructions (Signed)

## 2021-12-15 NOTE — Progress Notes (Signed)
Follow-Up Visit   Subjective  Cody Bridges is a 88 y.o. male who presents for the following: Follow-up (Atopic  Derm follow up - Dupixent injections). The patient presents for Upper Body Skin Exam (UBSE) for skin cancer screening and mole check.  The patient has spots, moles and lesions to be evaluated, some may be new or changing and the patient has concerns that these could be cancer.  The following portions of the chart were reviewed this encounter and updated as appropriate:   Tobacco  Allergies  Meds  Problems  Med Hx  Surg Hx  Fam Hx     Review of Systems:  No other skin or systemic complaints except as noted in HPI or Assessment and Plan.  Objective  Well appearing patient in no apparent distress; mood and affect are within normal limits.  All skin waist up examined.  Xerosis and peeling  Left lat canthus Erythematous thin papules/macules with gritty scale.   Right medial cheek Erythematous stuck-on, waxy papule or plaque   Assessment & Plan   Actinic Damage - chronic, secondary to cumulative UV radiation exposure/sun exposure over time - diffuse scaly erythematous macules with underlying dyspigmentation - Recommend daily broad spectrum sunscreen SPF 30+ to sun-exposed areas, reapply every 2 hours as needed.  - Recommend staying in the shade or wearing long sleeves, sun glasses (UVA+UVB protection) and wide brim hats (4-inch brim around the entire circumference of the hat). - Call for new or changing lesions.  AK (actinic keratosis) Left lat canthus/upper eyelid margin  Destruction of lesion - Left lat canthus Complexity: simple   Destruction method: cryotherapy   Informed consent: discussed and consent obtained   Timeout:  patient name, date of birth, surgical site, and procedure verified Lesion destroyed using liquid nitrogen: Yes   Region frozen until ice ball extended beyond lesion: Yes   Outcome: patient tolerated procedure well with no  complications   Post-procedure details: wound care instructions given    Other atopic dermatitis Chronic and persistent condition with duration or expected duration over one year. Condition is bothersome/symptomatic for patient. Currently flared.  Atopic dermatitis - Severe, on Dupixent (biologic medication).  Atopic dermatitis (eczema) is a chronic, relapsing, pruritic condition that can significantly affect quality of life. It is often associated with allergic rhinitis and/or asthma and can require treatment with topical medications, phototherapy, or in severe cases a biologic medication called Dupixent, which requires long term medication management.     Continue Dupixent '300mg'$  every 2 weeks.    Start Mometasone cream twice daily to affected areas up to 2 weeks as needed for rash. Avoid applying to face, groin, and axilla. Use as directed. Long-term use can cause thinning of the skin.   Dupixent '300mg'$  injected into left upper arm. Patient tolerated well. Lot #[id number] Exp 12/2023  dupilumab (DUPIXENT) prefilled syringe 300 mg  Xerosis cutis Continue Cerave moisturizer daily  Inflamed seborrheic keratosis Right medial cheek Destruction of lesion - Right medial cheek Complexity: simple   Destruction method: cryotherapy   Informed consent: discussed and consent obtained   Timeout:  patient name, date of birth, surgical site, and procedure verified Lesion destroyed using liquid nitrogen: Yes   Region frozen until ice ball extended beyond lesion: Yes   Outcome: patient tolerated procedure well with no complications   Post-procedure details: wound care instructions given    Return for every 2 weeks with nurse for injection, 3 months with Dr Nehemiah Massed.  I, Ashok Cordia, CMA, am acting  as scribe for Sarina Ser, MD . Documentation: I have reviewed the above documentation for accuracy and completeness, and I agree with the above.  Sarina Ser, MD

## 2021-12-15 NOTE — Telephone Encounter (Signed)
I am not sure what to make of the denial letter as they state they wont cover the albuterol sulfate CFC-free aerosol and then they say that it is one of the formulary alternatives. I am not sure exactly what I need to prescribe to get it covered. Can you call the insurance on Thursday to see if they can give some idea of what they will cover? ?

## 2021-12-17 NOTE — Telephone Encounter (Signed)
I called and spoke with the pharmacist and  informed him that the medication was denied but the alternatives are the same. he stated that it is the same medication but the patient wants the same [facility], he stated that he would let the patient know about the denial of the medication coverage.  Cyara Devoto,cma

## 2021-12-19 ENCOUNTER — Other Ambulatory Visit: Payer: Self-pay | Admitting: Family Medicine

## 2021-12-19 DIAGNOSIS — E119 Type 2 diabetes mellitus without complications: Secondary | ICD-10-CM

## 2021-12-19 DIAGNOSIS — R0981 Nasal congestion: Secondary | ICD-10-CM

## 2021-12-25 ENCOUNTER — Encounter: Payer: Self-pay | Admitting: Dermatology

## 2021-12-29 ENCOUNTER — Ambulatory Visit (INDEPENDENT_AMBULATORY_CARE_PROVIDER_SITE_OTHER): Payer: Medicare Other | Admitting: Dermatology

## 2021-12-29 DIAGNOSIS — L2089 Other atopic dermatitis: Secondary | ICD-10-CM

## 2021-12-29 DIAGNOSIS — L209 Atopic dermatitis, unspecified: Secondary | ICD-10-CM

## 2021-12-29 NOTE — Progress Notes (Signed)
Patient here today for two week Dupixent injection for severe atopic dermatitis. Dupixent '300mg'$ /29m injected into right upper arm. Patient tolerated well.    LOT: [id number]: 12/2023   AJohnsie Kindred RMA Documentation: I have reviewed the above documentation for accuracy and completeness, and I agree with the above.  DSarina Ser MD

## 2021-12-30 ENCOUNTER — Other Ambulatory Visit: Payer: Self-pay | Admitting: Family Medicine

## 2021-12-30 DIAGNOSIS — E119 Type 2 diabetes mellitus without complications: Secondary | ICD-10-CM

## 2022-01-03 ENCOUNTER — Encounter: Payer: Self-pay | Admitting: Dermatology

## 2022-01-12 ENCOUNTER — Encounter: Payer: Self-pay | Admitting: Dermatology

## 2022-01-12 ENCOUNTER — Ambulatory Visit (INDEPENDENT_AMBULATORY_CARE_PROVIDER_SITE_OTHER): Payer: Medicare Other | Admitting: Dermatology

## 2022-01-12 DIAGNOSIS — L209 Atopic dermatitis, unspecified: Secondary | ICD-10-CM | POA: Diagnosis not present

## 2022-01-12 MED ORDER — DUPILUMAB 300 MG/2ML ~~LOC~~ SOSY
300.0000 mg | PREFILLED_SYRINGE | Freq: Once | SUBCUTANEOUS | Status: AC
  Administered 2022-01-12: 300 mg via SUBCUTANEOUS

## 2022-01-12 NOTE — Progress Notes (Signed)
Patient here today for two week Dupixent injection for severe atopic dermatitis. Dupixent '300mg'$ /10m injected into left upper arm. Patient tolerated well.    LOT: [id number]: 11/2023  ADicie BeamRMA Documentation: I have reviewed the above documentation for accuracy and completeness, and I agree with the above.  DSarina Ser MD

## 2022-01-20 ENCOUNTER — Ambulatory Visit (INDEPENDENT_AMBULATORY_CARE_PROVIDER_SITE_OTHER): Payer: Medicare Other | Admitting: Pulmonary Disease

## 2022-01-20 ENCOUNTER — Encounter: Payer: Self-pay | Admitting: Pulmonary Disease

## 2022-01-20 VITALS — BP 120/68 | HR 100 | Temp 97.8°F | Ht 64.0 in | Wt 205.4 lb

## 2022-01-20 DIAGNOSIS — J449 Chronic obstructive pulmonary disease, unspecified: Secondary | ICD-10-CM

## 2022-01-20 NOTE — Progress Notes (Signed)
[facility]  Chief Complaint  Patient presents with   pulmonary consult    Hx of COPD- SOB with exertion and occ at rest and occ dry cough.     Past Surgical History:  He  has a past surgical history that includes Hammer toe surgery (Right); Bunionectomy (Right); Colonoscopy (2016); Tonsillectomy (1938); Eye surgery (Right); Electormagnetic navigation bronchoscopy (N/A, 02/09/2017); and Cataract extraction w/PHACO (Left, 08/22/2017).  Past Medical History:  Arthritis, COVID 16 February 2021, DM type 2, GERD, HLD, HTN  Constitutional:  BP 120/68 (BP Location: Left Arm, Cuff Size: Normal)   Pulse 100   Temp 97.8 F (36.6 C) (Temporal)   Ht '5\' 4"'  (1.626 m)   Wt 205 lb 6.4 oz (93.2 kg)   SpO2 95%   BMI 35.26 kg/m   Brief Summary:  Cody Bridges is a 88 y.o. male former smoker with COPD and asthma.      Subjective:   He is here with his daughter.  He was previously seen by Dr. Ashby Dawes and Dr. Raul Del.  He gets winded with activity.  He has good days and bad days.  Not having cough, wheeze, sputum, chest pain, hemoptysis, or leg swelling.  Uses trelegy daily and this helps.  Uses albuterol intermittently.  No issues with his breathing while asleep.  He had PFT in 2018 that showed moderate obstruction and positive bronchodilator response.  His CT chest from February 2023 showed stable to decreased size of lung nodules, and these are benign.  Physical Exam:   Appearance - well kempt   ENMT - no sinus tenderness, no oral exudate, no LAN, Mallampati 3 airway, no stridor, wears dentures  Respiratory - equal breath sounds bilaterally, no wheezing or rales  CV - s1s2 regular rate and rhythm, no murmurs  Ext - no clubbing, no edema  Skin - no rashes  Psych - normal mood and affect   Pulmonary testing:  PFT 12/15/16 >> FEV1 1.75 (59%), FEV1% 59, TLC 5.93 (93%), DLCO 92%, +BD  Chest Imaging:  CT chest 09/24/21 >>  atherosclerosis, 9 mm LLL nodule stable, 3 mm LLL nodule stable  Cardiac Tests:  Echo 10/18/21 >> EF greater than 55%, mild LVH  Social History:  He  reports that he quit smoking about 36 years ago. His smoking use included cigarettes. He has a 70.00 pack-year smoking history. He has never used smokeless tobacco. He reports current alcohol use. He reports that he does not use drugs.  Family History:  His family history includes Cancer in his mother; Stomach cancer in his mother and sister.     Assessment/Plan:   COPD with asthma. - continue trelegy 100 one puff daily - prn albuterol - discussed different roles for his inhalers and when to use them - advised he can use albuterol ahead of time for activities he knows will make his breathing worse  Allergic rhinitis. - continue azelastine, flonase  Severe atopic dermatitis. - followed by Dr. Sarina Ser with dermatology - remains on dupixent for this  Time Spent Involved in Patient Care on Day of Examination:  53 minutes  Follow up:   Patient Instructions  Follow up in 6 months  Medication List:   Allergies as of 01/20/2022       Reactions   Clindamycin Hives   Lisinopril Cough   Penicillin V Potassium Rash, Other (See Comments)   Has patient had a PCN reaction causing immediate rash, facial/tongue/throat swelling, SOB or lightheadedness  with hypotension: Unknown Has patient had a PCN reaction causing severe rash involving mucus membranes or skin necrosis: Unknown Has patient had a PCN reaction that required hospitalization: No Has patient had a PCN reaction occurring within the last 10 years: No If all of the above answers are "NO", then may proceed with Cephalosporin use.        Medication List        Accurate as of January 20, 2022 10:06 AM. If you have any questions, ask your nurse or doctor.          STOP taking these medications    azithromycin 500 MG tablet Commonly known as: Zithromax Stopped by:  Chesley Mires, MD   benzonatate 200 MG capsule Commonly known as: TESSALON Stopped by: Chesley Mires, MD   guaiFENesin-dextromethorphan 100-10 MG/5ML syrup Commonly known as: ROBITUSSIN DM Stopped by: Chesley Mires, MD   loratadine 10 MG tablet Commonly known as: CLARITIN Stopped by: Chesley Mires, MD   predniSONE 10 MG tablet Commonly known as: DELTASONE Stopped by: Chesley Mires, MD       TAKE these medications    Accu-Chek FastClix Lancets Misc Use up to Once daily as Directed  DX:  E11.9   Accu-Chek Guide test strip Generic drug: glucose blood USE UP TO ONCE DAILY AS DIRECTED DX: E11.9   acetaminophen 500 MG tablet Commonly known as: TYLENOL Take 1,000 mg by mouth every 6 (six) hours as needed for mild pain or moderate pain.   albuterol 108 (90 Base) MCG/ACT inhaler Commonly known as: VENTOLIN HFA INHALE 1-2 PUFFS BY MOUTH EVERY 6 HOURS AS NEEDED FOR WHEEZE OR SHORTNESS OF BREATH   amLODipine 5 MG tablet Commonly known as: NORVASC Take 5 mg by mouth daily.   azelastine 0.1 % nasal spray Commonly known as: ASTELIN Place 2 sprays into both nostrils 2 (two) times daily. Use in each nostril as directed   blood glucose meter kit and supplies Kit Dispense based on patient and insurance preference. Use once daily as directed. (FOR ICD-10  E11.9).   calcium carbonate 500 MG chewable tablet Commonly known as: TUMS - dosed in mg elemental calcium Chew 1-2 tablets by mouth 3 (three) times daily as needed (for acid reflux/indigestion.).   carboxymethylcellulose 0.5 % Soln Commonly known as: REFRESH PLUS Apply 1 drop to eye 3 (three) times daily as needed (dry eyes).   CeraVe Daily Moisturizing Lotn Apply 1 application topically daily as needed (dry skin).   Dupixent 300 MG/2ML prefilled syringe Generic drug: dupilumab INJECT 1 SYRINGE UNDER THE SKIN EVERY OTHER WEEK   finasteride 5 MG tablet Commonly known as: PROSCAR Take 5 mg by mouth daily.   fluticasone 50  MCG/ACT nasal spray Commonly known as: FLONASE Place 2 sprays into both nostrils daily.   Gemtesa 75 MG Tabs Generic drug: Vibegron Take 75 mg by mouth daily.   hydrochlorothiazide 25 MG tablet Commonly known as: HYDRODIURIL TAKE 1 TABLET BY MOUTH EVERY DAY   metFORMIN 500 MG tablet Commonly known as: GLUCOPHAGE TAKE 1 TABLET BY MOUTH TWICE A DAY   mometasone 0.1 % cream Commonly known as: ELOCON APPLY TWICE DAILY TO AFFECTED AREAS UP TO 2 WEEKS AS NEEDED FOR RASH   multivitamin with minerals Tabs tablet Take 1 tablet by mouth daily.   PRESERVISION AREDS PO Take 1 capsule by mouth in the morning and at bedtime.   rosuvastatin 20 MG tablet Commonly known as: CRESTOR TAKE 1 TABLET BY MOUTH EVERY DAY   tamsulosin 0.4  MG Caps capsule Commonly known as: FLOMAX TAKE 1 CAPSULE BY MOUTH EVERY DAY   Trelegy Ellipta 100-62.5-25 MCG/ACT Aepb Generic drug: Fluticasone-Umeclidin-Vilant Inhale 1 puff into the lungs daily.   Vitamin D-3 125 MCG (5000 UT) Tabs Take 5,000 Units by mouth daily.        Signature:  Chesley Mires, MD [place] Pager - [phone] 01/20/2022, 10:06 AM

## 2022-01-20 NOTE — Patient Instructions (Signed)
Follow up in 6 months 

## 2022-01-26 ENCOUNTER — Ambulatory Visit (INDEPENDENT_AMBULATORY_CARE_PROVIDER_SITE_OTHER): Payer: Medicare Other | Admitting: Dermatology

## 2022-01-26 DIAGNOSIS — L209 Atopic dermatitis, unspecified: Secondary | ICD-10-CM

## 2022-01-26 MED ORDER — DUPILUMAB 300 MG/2ML ~~LOC~~ SOSY
300.0000 mg | PREFILLED_SYRINGE | Freq: Once | SUBCUTANEOUS | Status: AC
  Administered 2022-01-26: 300 mg via SUBCUTANEOUS

## 2022-01-26 NOTE — Progress Notes (Signed)
Patient here today for two week Dupixent injection for severe atopic dermatitis. Dupixent '300mg'$ /46m injected into right upper arm. Patient tolerated well.    LOT: [id number]: 11/2023   ADicie BeamRMA  Documentation: I have reviewed the above documentation for accuracy and completeness, and I agree with the above.  VForest Gleason MD

## 2022-02-01 ENCOUNTER — Encounter: Payer: Self-pay | Admitting: Dermatology

## 2022-02-02 ENCOUNTER — Other Ambulatory Visit: Payer: Self-pay | Admitting: Internal Medicine

## 2022-02-02 DIAGNOSIS — J449 Chronic obstructive pulmonary disease, unspecified: Secondary | ICD-10-CM

## 2022-02-09 ENCOUNTER — Ambulatory Visit (INDEPENDENT_AMBULATORY_CARE_PROVIDER_SITE_OTHER): Payer: Medicare Other | Admitting: Dermatology

## 2022-02-09 DIAGNOSIS — L209 Atopic dermatitis, unspecified: Secondary | ICD-10-CM

## 2022-02-09 DIAGNOSIS — L2081 Atopic neurodermatitis: Secondary | ICD-10-CM | POA: Diagnosis not present

## 2022-02-09 NOTE — Progress Notes (Signed)
Patient here today for two week Dupixent injection for severe atopic dermatitis. Dupixent '300mg'$ /7m injected into left upper arm. Patient tolerated well.    LOT: [id number]: 01/2024   AJohnsie Kindred RMA Documentation: I have reviewed the above documentation for accuracy and completeness, and I agree with the above.  DSarina Ser MD

## 2022-02-14 ENCOUNTER — Other Ambulatory Visit: Payer: Self-pay

## 2022-02-14 MED ORDER — DUPIXENT 300 MG/2ML ~~LOC~~ SOSY: PREFILLED_SYRINGE | SUBCUTANEOUS | 6 refills | Status: DC

## 2022-02-14 NOTE — Progress Notes (Signed)
PA for dupixent-escripted to [facility]

## 2022-02-15 DIAGNOSIS — H353221 Exudative age-related macular degeneration, left eye, with active choroidal neovascularization: Secondary | ICD-10-CM | POA: Diagnosis not present

## 2022-02-19 ENCOUNTER — Encounter: Payer: Self-pay | Admitting: Dermatology

## 2022-02-23 ENCOUNTER — Ambulatory Visit (INDEPENDENT_AMBULATORY_CARE_PROVIDER_SITE_OTHER): Payer: Medicare Other | Admitting: Dermatology

## 2022-02-23 DIAGNOSIS — L209 Atopic dermatitis, unspecified: Secondary | ICD-10-CM

## 2022-02-23 NOTE — Progress Notes (Signed)
Patient here today for two week Dupixent injection for severe atopic dermatitis. Dupixent '300mg'$ /30m injected into right upper arm. Patient tolerated well.    LOT: [id number]: 01/2024   AJohnsie Kindred RMA Documentation: I have reviewed the above documentation for accuracy and completeness, and I agree with the above.  DSarina Ser MD

## 2022-02-27 ENCOUNTER — Encounter: Payer: Self-pay | Admitting: Dermatology

## 2022-03-09 ENCOUNTER — Encounter: Payer: Self-pay | Admitting: Dermatology

## 2022-03-09 ENCOUNTER — Ambulatory Visit (INDEPENDENT_AMBULATORY_CARE_PROVIDER_SITE_OTHER): Payer: Medicare Other | Admitting: Dermatology

## 2022-03-09 DIAGNOSIS — L209 Atopic dermatitis, unspecified: Secondary | ICD-10-CM | POA: Diagnosis not present

## 2022-03-09 MED ORDER — DUPILUMAB 300 MG/2ML ~~LOC~~ SOSY
300.0000 mg | PREFILLED_SYRINGE | Freq: Once | SUBCUTANEOUS | Status: AC
  Administered 2022-03-09: 300 mg via SUBCUTANEOUS

## 2022-03-09 NOTE — Progress Notes (Signed)
Patient here today for two week Dupixent injection for severe atopic dermatitis. Dupixent '300mg'$ /41m injected into left upper arm. Patient tolerated well.    [id number]: 01/2024  ADicie BeamRMA Documentation: I have reviewed the above documentation for accuracy and completeness, and I agree with the above.  DSarina Ser MD

## 2022-03-21 ENCOUNTER — Other Ambulatory Visit: Payer: Self-pay | Admitting: Family Medicine

## 2022-03-23 ENCOUNTER — Ambulatory Visit (INDEPENDENT_AMBULATORY_CARE_PROVIDER_SITE_OTHER): Payer: Medicare Other | Admitting: Dermatology

## 2022-03-23 DIAGNOSIS — L209 Atopic dermatitis, unspecified: Secondary | ICD-10-CM | POA: Diagnosis not present

## 2022-03-23 MED ORDER — DUPILUMAB 300 MG/2ML ~~LOC~~ SOSY
300.0000 mg | PREFILLED_SYRINGE | Freq: Once | SUBCUTANEOUS | Status: AC
  Administered 2022-03-23: 300 mg via SUBCUTANEOUS

## 2022-03-23 NOTE — Progress Notes (Signed)
Patient here today for two week Dupixent injection for severe atopic dermatitis. Dupixent '300mg'$ /43m injected into right upper arm. Patient tolerated well.    [id number]: 01/2024   ADicie BeamRMA Documentation: I have reviewed the above documentation for accuracy and completeness, and I agree with the above.  DSarina Ser MD

## 2022-03-25 ENCOUNTER — Encounter: Payer: Self-pay | Admitting: Dermatology

## 2022-03-30 ENCOUNTER — Ambulatory Visit: Payer: Medicare Other | Admitting: Dermatology

## 2022-03-31 ENCOUNTER — Ambulatory Visit: Payer: Medicare Other | Admitting: Dermatology

## 2022-03-31 ENCOUNTER — Telehealth: Payer: Self-pay | Admitting: Family Medicine

## 2022-03-31 NOTE — Telephone Encounter (Signed)
Pt friend called stating he need proof stating the reason he is moving out of his apartment to an assisted living home

## 2022-03-31 NOTE — Telephone Encounter (Signed)
I have not previously discussed this with the patient. He would need an appointment to discuss this and determine the reason for the move for me to determine if I could give him a letter as to why he is moving.

## 2022-04-01 NOTE — Telephone Encounter (Signed)
I called and spoke with Mrs. Tomson they are moving in October to [facility] assisted living, she stated that Mr. Miranda is having vision issues and that's why they want to move, the place will take care of everything even transportation.  She would like to pick up the letter today before we close if possible.  Thalia Turkington,cma

## 2022-04-01 NOTE — Telephone Encounter (Signed)
Pt called stating he need the letter because he has difficulty walking, breathing, memory and vision issues, urinating a lot, not able to cook or grocery shop for hisself, transportation issues and has macular degeneration

## 2022-04-01 NOTE — Telephone Encounter (Signed)
Letter created and signed.  Please make available for pickup.

## 2022-04-01 NOTE — Telephone Encounter (Signed)
I called the patient and they stated they needed the letter right away and was wondering if you could see them today, you have no openings until September 11 and they stated that would be to late and they had paid out $4000 for the assisted living.    Aunya Lemler,cma

## 2022-04-01 NOTE — Telephone Encounter (Signed)
Noted. Can you find out where they are moving? Is it to an assisted living facility? Is he having issues with his vision related to the macular degeneration? I can provide the letter, though if he is having more issues with all the things listed then he will also need to be scheduled for the next available appointment that is 30 minutes in length. Thanks.

## 2022-04-01 NOTE — Telephone Encounter (Signed)
I called and spoke with the wife of the patient and informed her that letter was ready for pickup and she understood.  Mana Morison,cma

## 2022-04-06 ENCOUNTER — Ambulatory Visit: Payer: Medicare Other | Admitting: Dermatology

## 2022-04-07 ENCOUNTER — Ambulatory Visit (INDEPENDENT_AMBULATORY_CARE_PROVIDER_SITE_OTHER): Payer: Medicare Other | Admitting: Dermatology

## 2022-04-07 DIAGNOSIS — L578 Other skin changes due to chronic exposure to nonionizing radiation: Secondary | ICD-10-CM | POA: Diagnosis not present

## 2022-04-07 DIAGNOSIS — L209 Atopic dermatitis, unspecified: Secondary | ICD-10-CM | POA: Diagnosis not present

## 2022-04-07 DIAGNOSIS — Z79899 Other long term (current) drug therapy: Secondary | ICD-10-CM | POA: Diagnosis not present

## 2022-04-07 DIAGNOSIS — L570 Actinic keratosis: Secondary | ICD-10-CM | POA: Diagnosis not present

## 2022-04-07 DIAGNOSIS — L821 Other seborrheic keratosis: Secondary | ICD-10-CM | POA: Diagnosis not present

## 2022-04-07 DIAGNOSIS — L814 Other melanin hyperpigmentation: Secondary | ICD-10-CM | POA: Diagnosis not present

## 2022-04-07 DIAGNOSIS — R238 Other skin changes: Secondary | ICD-10-CM | POA: Diagnosis not present

## 2022-04-07 DIAGNOSIS — D1801 Hemangioma of skin and subcutaneous tissue: Secondary | ICD-10-CM

## 2022-04-07 DIAGNOSIS — L853 Xerosis cutis: Secondary | ICD-10-CM

## 2022-04-07 DIAGNOSIS — D229 Melanocytic nevi, unspecified: Secondary | ICD-10-CM

## 2022-04-07 MED ORDER — DUPILUMAB 300 MG/2ML ~~LOC~~ SOSY
300.0000 mg | PREFILLED_SYRINGE | Freq: Once | SUBCUTANEOUS | Status: AC
  Administered 2022-04-07: 300 mg via SUBCUTANEOUS

## 2022-04-07 NOTE — Progress Notes (Unsigned)
Follow-Up Visit   Subjective  Cody Bridges is a 88 y.o. male who presents for the following: Follow-up (Patient here today for follow up on atopic dermatitis. Currently getting dupixent injections every 2 weeks. Patient denies any injection site reactions or other side effects he has noticed.). The patient has spots, moles and lesions to be evaluated, some may be new or changing and the patient has concerns that these could be cancer.  The following portions of the chart were reviewed this encounter and updated as appropriate:  Tobacco  Allergies  Meds  Problems  Med Hx  Surg Hx  Fam Hx     Review of Systems: No other skin or systemic complaints except as noted in HPI or Assessment and Plan.  Objective  Well appearing patient in no apparent distress; mood and affect are within normal limits.  A full examination was performed including scalp, head, eyes, ears, nose, lips, neck, chest, axillae, abdomen, back, buttocks, bilateral upper extremities, bilateral lower extremities, hands, feet, fingers, toes, fingernails, and toenails. All findings within normal limits unless otherwise noted below.  torso, arms, legs, head Clear at exam   Left Ear x 1 Erythematous thin papules/macules with gritty scale.    Assessment & Plan  Atopic dermatitis,  torso, arms, legs, head Atopic dermatitis (eczema) is a chronic, relapsing, pruritic condition that can significantly affect quality of life. It is often associated with allergic rhinitis and/or asthma and can require treatment with topical medications, phototherapy, or in severe cases biologic injectable medication (Dupixent; Adbry) or Oral JAK inhibitors.  Chronic condition with duration or expected duration over one year. Currently well-controlled.  Atopic dermatitis - Severe, on Dupixent (biologic medication).  Atopic dermatitis (eczema) is a chronic, relapsing, pruritic condition that can significantly affect quality of life. It is  often associated with allergic rhinitis and/or asthma and can require treatment with topical medications, phototherapy, or in severe cases a biologic medication called Dupixent, which requires long term medication management.     Continue Dupixent '300mg'$  every 2 weeks.  Lot [id number] 01/2024 [place] 9024-0973-53 Injected into patient's left upper arm, patient tolerated well with no adverse reactions   Continue as needed for flares Mometasone cream twice daily to affected areas up to 2 weeks as needed for rash. Avoid applying to face, groin, and axilla. Use as directed. Long-term use can cause thinning of the skin.  dupilumab (DUPIXENT) prefilled syringe 300 mg - torso, arms, legs, head Related Medications dupilumab (DUPIXENT) prefilled syringe 300 mg  Actinic keratosis Left Ear x 1 Actinic keratoses are precancerous spots that appear secondary to cumulative UV radiation exposure/sun exposure over time. They are chronic with expected duration over 1 year. A portion of actinic keratoses will progress to squamous cell carcinoma of the skin. It is not possible to reliably predict which spots will progress to skin cancer and so treatment is recommended to prevent development of skin cancer.  Recommend daily broad spectrum sunscreen SPF 30+ to sun-exposed areas, reapply every 2 hours as needed.  Recommend staying in the shade or wearing long sleeves, sun glasses (UVA+UVB protection) and wide brim hats (4-inch brim around the entire circumference of the hat). Call for new or changing lesions.  Destruction of lesion - Left Ear x 1 Complexity: simple   Destruction method: cryotherapy   Informed consent: discussed and consent obtained   Timeout:  patient name, date of birth, surgical site, and procedure verified Lesion destroyed using liquid nitrogen: Yes   Region frozen until  ice ball extended beyond lesion: Yes   Outcome: patient tolerated procedure well with no complications   Post-procedure  details: wound care instructions given   Additional details:  Prior to procedure, discussed risks of blister formation, small wound, skin dyspigmentation, or rare scar following cryotherapy. Recommend Vaseline ointment to treated areas while healing.  Venous lake left supierior ear helix Benign, observe.   Seborrheic Keratoses - Stuck-on, waxy, tan-brown papules and/or plaques  - Benign-appearing - Discussed benign etiology and prognosis. - Observe - Call for any changes  Hemangiomas - Red papules - Discussed benign nature - Observe - Call for any changes  Xerosis - diffuse xerotic patches - recommend gentle, hydrating skin care - gentle skin care handout given  Lentigines - Scattered tan macules - Due to sun exposure - Benign-appering, observe - Recommend daily broad spectrum sunscreen SPF 30+ to sun-exposed areas, reapply every 2 hours as needed. - Call for any changes  Actinic Damage - chronic, secondary to cumulative UV radiation exposure/sun exposure over time - diffuse scaly erythematous macules with underlying dyspigmentation - Recommend daily broad spectrum sunscreen SPF 30+ to sun-exposed areas, reapply every 2 hours as needed.  - Recommend staying in the shade or wearing long sleeves, sun glasses (UVA+UVB protection) and wide brim hats (4-inch brim around the entire circumference of the hat). - Call for new or changing lesions.  Return for 2 week nurse visit dupixent, 6 month follow up atopic dermatitis and tbse .  IRuthell Rummage, CMA, am acting as scribe for Sarina Ser, MD. Documentation: I have reviewed the above documentation for accuracy and completeness, and I agree with the above.  Sarina Ser, MD

## 2022-04-07 NOTE — Patient Instructions (Addendum)
Potential side effects include allergic reaction, herpes infections, injection site reactions and conjunctivitis (inflammation of the eyes).  The use of Dupixent requires long term medication management, including periodic office visits.   Gentle Skin Care Guide  1. Bathe no more than once a day.  2. Avoid bathing in hot water  3. Use a mild soap like Dove, Vanicream, Cetaphil, CeraVe. Can use Lever 2000 or Cetaphil antibacterial soap  4. Use soap only where you need it. On most days, use it under your arms, between your legs, and on your feet. Let the water rinse other areas unless visibly dirty.  5. When you get out of the bath/shower, use a towel to gently blot your skin dry, don't rub it.  6. While your skin is still a little damp, apply a moisturizing cream such as Vanicream, CeraVe, Cetaphil, Eucerin, Sarna lotion or plain Vaseline Jelly. For hands apply Neutrogena [place] Hand Cream or Excipial Hand Cream.  7. Reapply moisturizer any time you start to itch or feel dry.  8. Sometimes using free and clear laundry detergents can be helpful. Fabric softener sheets should be avoided. Downy Free & Gentle liquid, or any liquid fabric softener that is free of dyes and perfumes, it acceptable to use  9. If your doctor has given you prescription creams you may apply moisturizers over them      Due to recent changes in healthcare laws, you may see results of your pathology and/or laboratory studies on MyChart before the doctors have had a chance to review them. We understand that in some cases there may be results that are confusing or concerning to you. Please understand that not all results are received at the same time and often the doctors may need to interpret multiple results in order to provide you with the best plan of care or course of treatment. Therefore, we ask that you please give [place] 2 business days to thoroughly review all your results before contacting the office for  clarification. Should we see a critical lab result, you will be contacted sooner.   If You Need Anything After Your Visit  If you have any questions or concerns for your doctor, please call our main line at [phone] and press option 4 to reach your doctor's medical assistant. If no one answers, please leave a voicemail as directed and we will return your call as soon as possible. Messages left after 4 pm will be answered the following business day.   You may also send [place] a message via [place]. We typically respond to MyChart messages within 1-2 business days.  For prescription refills, please ask your pharmacy to contact our office. Our fax number is [phone].  If you have an urgent issue when the clinic is closed that cannot wait until the next business day, you can page your doctor at the number below.    Please note that while we do our best to be available for urgent issues outside of office hours, we are not available 24/7.   If you have an urgent issue and are unable to reach [place], you may choose to seek medical care at your doctor's office, retail clinic, urgent care center, or emergency room.  If you have a medical emergency, please immediately call 911 or go to the emergency department.  Pager Numbers  - Dr. Nehemiah Massed: [phone]  - Dr. Laurence Ferrari: [phone]  - Dr. Nicole Kindred: [phone]  In the event of inclement weather, please call our main line at [phone] for  an update on the status of any delays or closures.  Dermatology Medication Tips: Please keep the boxes that topical medications come in in order to help keep track of the instructions about where and how to use these. Pharmacies typically print the medication instructions only on the boxes and not directly on the medication tubes.   If your medication is too expensive, please contact our office at [phone] option 4 or send [place] a message through [place].   We are unable to tell what your co-pay for  medications will be in advance as this is different depending on your insurance coverage. However, we may be able to find a substitute medication at lower cost or fill out paperwork to get insurance to cover a needed medication.   If a prior authorization is required to get your medication covered by your insurance company, please allow [place] 1-2 business days to complete this process.  Drug prices often vary depending on where the prescription is filled and some pharmacies may offer cheaper prices.  The website www.goodrx.com contains coupons for medications through different pharmacies. The prices here do not account for what the cost may be with help from insurance (it may be cheaper with your insurance), but the website can give you the price if you did not use any insurance.  - You can print the associated coupon and take it with your prescription to the pharmacy.  - You may also stop by our office during regular business hours and pick up a GoodRx coupon card.  - If you need your prescription sent electronically to a different pharmacy, notify our office through [facility] or by phone at [phone] option 4.     Si Usted Necesita Algo Despus de Su Visita  Tambin puede enviarnos un mensaje a travs de Pharmacist, community. Por lo general respondemos a los mensajes de MyChart en el transcurso de 1 a 2 das hbiles.  Para renovar recetas, por favor pida a su farmacia que se ponga en contacto con nuestra oficina. Harland Dingwall de fax es [place] [phone].  Si tiene un asunto urgente cuando la clnica est cerrada y que no puede esperar hasta el siguiente da hbil, puede llamar/localizar a su doctor(a) al nmero que aparece a continuacin.   Por favor, tenga en cuenta que aunque hacemos todo lo posible para estar disponibles para asuntos urgentes fuera del horario de [place], no estamos disponibles las 24 horas del da, los 7 das de la [place].   Si tiene un problema urgente y no puede  comunicarse con nosotros, puede optar por buscar atencin mdica  en el consultorio de su doctor(a), en una clnica privada, en un centro de atencin urgente o en una sala de emergencias.  Si tiene Engineering geologist, por favor llame inmediatamente al 911 o vaya a la sala de emergencias.  Nmeros de bper  - Dr. Nehemiah Massed: [phone]  - Dra. Moye: [phone]  - Dra. Nicole Kindred: [phone]  En caso de inclemencias del [place], por favor llame a Johnsie Kindred principal al [phone] para una actualizacin sobre el [place] de cualquier retraso o cierre.  Consejos para la medicacin en dermatologa: Por favor, guarde las cajas en las que vienen los medicamentos de uso tpico para ayudarle a seguir las instrucciones sobre dnde y cmo usarlos. Las farmacias generalmente imprimen las instrucciones del medicamento slo en las cajas y no directamente en los tubos del [place].   Si su medicamento es [employer], por favor, pngase en contacto con Cleotis Nipper oficina  llamando al [phone] y presione la opcin 4 o envenos un mensaje a travs de Pharmacist, community.   No podemos decirle cul ser su copago por los medicamentos por adelantado ya que esto es diferente dependiendo de la cobertura de su seguro. Sin embargo, es posible que podamos encontrar un medicamento sustituto a Electrical engineer un formulario para que el seguro cubra el medicamento que se considera necesario.   Si se requiere una autorizacin previa para que su compaa de seguros [place] su medicamento, por favor permtanos de 1 a 2 das hbiles para completar este proceso.  Los precios de los medicamentos varan con frecuencia dependiendo del Environmental consultant de dnde se surte la receta y alguna farmacias pueden ofrecer precios ms baratos.  El sitio web www.goodrx.com tiene cupones para medicamentos de Airline pilot. Los precios aqu no tienen en cuenta lo que podra costar con la ayuda del seguro (puede ser ms barato con su seguro), pero  el sitio web puede darle el precio si no utiliz Research scientist (physical sciences).  - Puede imprimir el cupn correspondiente y llevarlo con su receta a la farmacia.  - Tambin puede pasar por nuestra oficina durante el horario de atencin regular y Charity fundraiser una tarjeta de cupones de GoodRx.  - Si necesita que su receta se enve electrnicamente a una farmacia diferente, informe a nuestra oficina a travs de MyChart de [place] o por telfono llamando al [phone] y presione la opcin 4.

## 2022-04-12 ENCOUNTER — Encounter: Payer: Self-pay | Admitting: Dermatology

## 2022-04-21 ENCOUNTER — Ambulatory Visit (INDEPENDENT_AMBULATORY_CARE_PROVIDER_SITE_OTHER): Payer: Medicare Other | Admitting: Dermatology

## 2022-04-21 ENCOUNTER — Other Ambulatory Visit: Payer: Self-pay

## 2022-04-21 DIAGNOSIS — L209 Atopic dermatitis, unspecified: Secondary | ICD-10-CM

## 2022-04-21 MED ORDER — DUPIXENT 300 MG/2ML ~~LOC~~ SOSY: PREFILLED_SYRINGE | SUBCUTANEOUS | 6 refills | Status: DC

## 2022-04-21 MED ORDER — DUPILUMAB 300 MG/2ML ~~LOC~~ SOSY
300.0000 mg | PREFILLED_SYRINGE | Freq: Once | SUBCUTANEOUS | Status: AC
  Administered 2022-04-21: 300 mg via SUBCUTANEOUS

## 2022-04-21 NOTE — Progress Notes (Signed)
Refill request

## 2022-04-21 NOTE — Progress Notes (Signed)
Patient here today for two week Dupixent injection for severe atopic dermatitis. Dupixent '300mg'$ /82m injected into right upper arm. Patient tolerated well.    [id number]: 01/2024 [id number]  ADicie BeamRMA Documentation: I have reviewed the above documentation for accuracy and completeness, and I agree with the above.  DSarina Ser MD

## 2022-04-26 ENCOUNTER — Encounter: Payer: Self-pay | Admitting: Dermatology

## 2022-05-04 ENCOUNTER — Ambulatory Visit (INDEPENDENT_AMBULATORY_CARE_PROVIDER_SITE_OTHER): Payer: Medicare Other | Admitting: Dermatology

## 2022-05-04 DIAGNOSIS — L209 Atopic dermatitis, unspecified: Secondary | ICD-10-CM | POA: Diagnosis not present

## 2022-05-04 MED ORDER — DUPILUMAB 300 MG/2ML ~~LOC~~ SOSY
300.0000 mg | PREFILLED_SYRINGE | Freq: Once | SUBCUTANEOUS | Status: AC
  Administered 2022-05-04: 300 mg via SUBCUTANEOUS

## 2022-05-04 NOTE — Progress Notes (Signed)
Patient here today for two week Dupixent injection for severe atopic dermatitis. Dupixent '300mg'$ /78m injected into left upper arm. Patient tolerated well.    [id number]: 05/2024 [id number]  ADicie BeamRMA Documentation: I have reviewed the above documentation for accuracy and completeness, and I agree with the above.  DSarina Ser MD

## 2022-05-05 ENCOUNTER — Ambulatory Visit: Payer: Medicare Other

## 2022-05-09 ENCOUNTER — Encounter: Payer: Self-pay | Admitting: Dermatology

## 2022-05-09 DIAGNOSIS — M6281 Muscle weakness (generalized): Secondary | ICD-10-CM | POA: Diagnosis not present

## 2022-05-09 DIAGNOSIS — M158 Other polyosteoarthritis: Secondary | ICD-10-CM | POA: Diagnosis not present

## 2022-05-09 DIAGNOSIS — R488 Other symbolic dysfunctions: Secondary | ICD-10-CM | POA: Diagnosis not present

## 2022-05-09 DIAGNOSIS — R41841 Cognitive communication deficit: Secondary | ICD-10-CM | POA: Diagnosis not present

## 2022-05-09 DIAGNOSIS — R2681 Unsteadiness on feet: Secondary | ICD-10-CM | POA: Diagnosis not present

## 2022-05-09 DIAGNOSIS — R2689 Other abnormalities of gait and mobility: Secondary | ICD-10-CM | POA: Diagnosis not present

## 2022-05-10 DIAGNOSIS — E1142 Type 2 diabetes mellitus with diabetic polyneuropathy: Secondary | ICD-10-CM | POA: Diagnosis not present

## 2022-05-10 DIAGNOSIS — B351 Tinea unguium: Secondary | ICD-10-CM | POA: Diagnosis not present

## 2022-05-11 DIAGNOSIS — R41841 Cognitive communication deficit: Secondary | ICD-10-CM | POA: Diagnosis not present

## 2022-05-11 DIAGNOSIS — R488 Other symbolic dysfunctions: Secondary | ICD-10-CM | POA: Diagnosis not present

## 2022-05-11 DIAGNOSIS — M6281 Muscle weakness (generalized): Secondary | ICD-10-CM | POA: Diagnosis not present

## 2022-05-11 DIAGNOSIS — R2681 Unsteadiness on feet: Secondary | ICD-10-CM | POA: Diagnosis not present

## 2022-05-11 DIAGNOSIS — M158 Other polyosteoarthritis: Secondary | ICD-10-CM | POA: Diagnosis not present

## 2022-05-11 DIAGNOSIS — R2689 Other abnormalities of gait and mobility: Secondary | ICD-10-CM | POA: Diagnosis not present

## 2022-05-12 DIAGNOSIS — R2681 Unsteadiness on feet: Secondary | ICD-10-CM | POA: Diagnosis not present

## 2022-05-12 DIAGNOSIS — R41841 Cognitive communication deficit: Secondary | ICD-10-CM | POA: Diagnosis not present

## 2022-05-12 DIAGNOSIS — M158 Other polyosteoarthritis: Secondary | ICD-10-CM | POA: Diagnosis not present

## 2022-05-12 DIAGNOSIS — R488 Other symbolic dysfunctions: Secondary | ICD-10-CM | POA: Diagnosis not present

## 2022-05-12 DIAGNOSIS — R2689 Other abnormalities of gait and mobility: Secondary | ICD-10-CM | POA: Diagnosis not present

## 2022-05-12 DIAGNOSIS — M6281 Muscle weakness (generalized): Secondary | ICD-10-CM | POA: Diagnosis not present

## 2022-05-14 ENCOUNTER — Other Ambulatory Visit: Payer: Self-pay | Admitting: Family Medicine

## 2022-05-14 DIAGNOSIS — J449 Chronic obstructive pulmonary disease, unspecified: Secondary | ICD-10-CM

## 2022-05-17 DIAGNOSIS — R2689 Other abnormalities of gait and mobility: Secondary | ICD-10-CM | POA: Diagnosis not present

## 2022-05-17 DIAGNOSIS — M158 Other polyosteoarthritis: Secondary | ICD-10-CM | POA: Diagnosis not present

## 2022-05-17 DIAGNOSIS — R488 Other symbolic dysfunctions: Secondary | ICD-10-CM | POA: Diagnosis not present

## 2022-05-17 DIAGNOSIS — M6281 Muscle weakness (generalized): Secondary | ICD-10-CM | POA: Diagnosis not present

## 2022-05-17 DIAGNOSIS — R2681 Unsteadiness on feet: Secondary | ICD-10-CM | POA: Diagnosis not present

## 2022-05-17 DIAGNOSIS — R41841 Cognitive communication deficit: Secondary | ICD-10-CM | POA: Diagnosis not present

## 2022-05-18 ENCOUNTER — Ambulatory Visit (INDEPENDENT_AMBULATORY_CARE_PROVIDER_SITE_OTHER): Payer: Medicare Other | Admitting: Dermatology

## 2022-05-18 DIAGNOSIS — R41841 Cognitive communication deficit: Secondary | ICD-10-CM | POA: Diagnosis not present

## 2022-05-18 DIAGNOSIS — R2681 Unsteadiness on feet: Secondary | ICD-10-CM | POA: Diagnosis not present

## 2022-05-18 DIAGNOSIS — M6281 Muscle weakness (generalized): Secondary | ICD-10-CM | POA: Diagnosis not present

## 2022-05-18 DIAGNOSIS — M158 Other polyosteoarthritis: Secondary | ICD-10-CM | POA: Diagnosis not present

## 2022-05-18 DIAGNOSIS — L209 Atopic dermatitis, unspecified: Secondary | ICD-10-CM | POA: Diagnosis not present

## 2022-05-18 DIAGNOSIS — R2689 Other abnormalities of gait and mobility: Secondary | ICD-10-CM | POA: Diagnosis not present

## 2022-05-18 DIAGNOSIS — R488 Other symbolic dysfunctions: Secondary | ICD-10-CM | POA: Diagnosis not present

## 2022-05-18 MED ORDER — DUPILUMAB 300 MG/2ML ~~LOC~~ SOSY
300.0000 mg | PREFILLED_SYRINGE | Freq: Once | SUBCUTANEOUS | Status: AC
  Administered 2022-05-18: 300 mg via SUBCUTANEOUS

## 2022-05-18 NOTE — Progress Notes (Signed)
Patient here today for two week Dupixent injection for severe atopic dermatitis. Dupixent '300mg'$ /89m injected into right upper arm. Patient tolerated well.    [id number]: 05/2024 [id number]  ADicie BeamRMA Documentation: I have reviewed the above documentation for accuracy and completeness, and I agree with the above.  DSarina Ser MD

## 2022-05-19 DIAGNOSIS — M6281 Muscle weakness (generalized): Secondary | ICD-10-CM | POA: Diagnosis not present

## 2022-05-19 DIAGNOSIS — R488 Other symbolic dysfunctions: Secondary | ICD-10-CM | POA: Diagnosis not present

## 2022-05-19 DIAGNOSIS — R2681 Unsteadiness on feet: Secondary | ICD-10-CM | POA: Diagnosis not present

## 2022-05-19 DIAGNOSIS — R41841 Cognitive communication deficit: Secondary | ICD-10-CM | POA: Diagnosis not present

## 2022-05-19 DIAGNOSIS — R2689 Other abnormalities of gait and mobility: Secondary | ICD-10-CM | POA: Diagnosis not present

## 2022-05-19 DIAGNOSIS — M158 Other polyosteoarthritis: Secondary | ICD-10-CM | POA: Diagnosis not present

## 2022-05-20 DIAGNOSIS — R2689 Other abnormalities of gait and mobility: Secondary | ICD-10-CM | POA: Diagnosis not present

## 2022-05-20 DIAGNOSIS — M158 Other polyosteoarthritis: Secondary | ICD-10-CM | POA: Diagnosis not present

## 2022-05-20 DIAGNOSIS — R2681 Unsteadiness on feet: Secondary | ICD-10-CM | POA: Diagnosis not present

## 2022-05-20 DIAGNOSIS — R488 Other symbolic dysfunctions: Secondary | ICD-10-CM | POA: Diagnosis not present

## 2022-05-20 DIAGNOSIS — M6281 Muscle weakness (generalized): Secondary | ICD-10-CM | POA: Diagnosis not present

## 2022-05-20 DIAGNOSIS — R41841 Cognitive communication deficit: Secondary | ICD-10-CM | POA: Diagnosis not present

## 2022-05-23 DIAGNOSIS — R2689 Other abnormalities of gait and mobility: Secondary | ICD-10-CM | POA: Diagnosis not present

## 2022-05-23 DIAGNOSIS — M158 Other polyosteoarthritis: Secondary | ICD-10-CM | POA: Diagnosis not present

## 2022-05-23 DIAGNOSIS — R2681 Unsteadiness on feet: Secondary | ICD-10-CM | POA: Diagnosis not present

## 2022-05-23 DIAGNOSIS — M6281 Muscle weakness (generalized): Secondary | ICD-10-CM | POA: Diagnosis not present

## 2022-05-23 DIAGNOSIS — R488 Other symbolic dysfunctions: Secondary | ICD-10-CM | POA: Diagnosis not present

## 2022-05-23 DIAGNOSIS — R41841 Cognitive communication deficit: Secondary | ICD-10-CM | POA: Diagnosis not present

## 2022-05-24 DIAGNOSIS — M158 Other polyosteoarthritis: Secondary | ICD-10-CM | POA: Diagnosis not present

## 2022-05-24 DIAGNOSIS — M6281 Muscle weakness (generalized): Secondary | ICD-10-CM | POA: Diagnosis not present

## 2022-05-24 DIAGNOSIS — R488 Other symbolic dysfunctions: Secondary | ICD-10-CM | POA: Diagnosis not present

## 2022-05-24 DIAGNOSIS — R2681 Unsteadiness on feet: Secondary | ICD-10-CM | POA: Diagnosis not present

## 2022-05-24 DIAGNOSIS — R41841 Cognitive communication deficit: Secondary | ICD-10-CM | POA: Diagnosis not present

## 2022-05-24 DIAGNOSIS — R2689 Other abnormalities of gait and mobility: Secondary | ICD-10-CM | POA: Diagnosis not present

## 2022-05-25 DIAGNOSIS — M158 Other polyosteoarthritis: Secondary | ICD-10-CM | POA: Diagnosis not present

## 2022-05-25 DIAGNOSIS — M6281 Muscle weakness (generalized): Secondary | ICD-10-CM | POA: Diagnosis not present

## 2022-05-25 DIAGNOSIS — R488 Other symbolic dysfunctions: Secondary | ICD-10-CM | POA: Diagnosis not present

## 2022-05-25 DIAGNOSIS — R41841 Cognitive communication deficit: Secondary | ICD-10-CM | POA: Diagnosis not present

## 2022-05-25 DIAGNOSIS — R2681 Unsteadiness on feet: Secondary | ICD-10-CM | POA: Diagnosis not present

## 2022-05-25 DIAGNOSIS — R2689 Other abnormalities of gait and mobility: Secondary | ICD-10-CM | POA: Diagnosis not present

## 2022-05-27 ENCOUNTER — Other Ambulatory Visit: Payer: Self-pay | Admitting: Family

## 2022-05-28 ENCOUNTER — Encounter: Payer: Self-pay | Admitting: Dermatology

## 2022-06-01 ENCOUNTER — Ambulatory Visit (INDEPENDENT_AMBULATORY_CARE_PROVIDER_SITE_OTHER): Payer: Medicare Other | Admitting: Dermatology

## 2022-06-01 DIAGNOSIS — L209 Atopic dermatitis, unspecified: Secondary | ICD-10-CM | POA: Diagnosis not present

## 2022-06-01 MED ORDER — DUPILUMAB 300 MG/2ML ~~LOC~~ SOSY
300.0000 mg | PREFILLED_SYRINGE | Freq: Once | SUBCUTANEOUS | Status: AC
  Administered 2022-06-01: 300 mg via SUBCUTANEOUS

## 2022-06-01 NOTE — Progress Notes (Signed)
Patient here today for two week Dupixent injection for severe atopic dermatitis. Dupixent '300mg'$ /100m injected into left upper arm. Patient tolerated well.    [id number]: 04/2024 [id number]  ADicie BeamRMA Documentation: I have reviewed the above documentation for accuracy and completeness, and I agree with the above.  DSarina Ser MD

## 2022-06-03 DIAGNOSIS — M6281 Muscle weakness (generalized): Secondary | ICD-10-CM | POA: Diagnosis not present

## 2022-06-03 DIAGNOSIS — R41841 Cognitive communication deficit: Secondary | ICD-10-CM | POA: Diagnosis not present

## 2022-06-03 DIAGNOSIS — R2681 Unsteadiness on feet: Secondary | ICD-10-CM | POA: Diagnosis not present

## 2022-06-03 DIAGNOSIS — M158 Other polyosteoarthritis: Secondary | ICD-10-CM | POA: Diagnosis not present

## 2022-06-03 DIAGNOSIS — R488 Other symbolic dysfunctions: Secondary | ICD-10-CM | POA: Diagnosis not present

## 2022-06-03 DIAGNOSIS — R2689 Other abnormalities of gait and mobility: Secondary | ICD-10-CM | POA: Diagnosis not present

## 2022-06-08 DIAGNOSIS — R2689 Other abnormalities of gait and mobility: Secondary | ICD-10-CM | POA: Diagnosis not present

## 2022-06-08 DIAGNOSIS — R2681 Unsteadiness on feet: Secondary | ICD-10-CM | POA: Diagnosis not present

## 2022-06-08 DIAGNOSIS — M6281 Muscle weakness (generalized): Secondary | ICD-10-CM | POA: Diagnosis not present

## 2022-06-08 DIAGNOSIS — R488 Other symbolic dysfunctions: Secondary | ICD-10-CM | POA: Diagnosis not present

## 2022-06-08 DIAGNOSIS — M158 Other polyosteoarthritis: Secondary | ICD-10-CM | POA: Diagnosis not present

## 2022-06-08 DIAGNOSIS — R41841 Cognitive communication deficit: Secondary | ICD-10-CM | POA: Diagnosis not present

## 2022-06-09 DIAGNOSIS — R488 Other symbolic dysfunctions: Secondary | ICD-10-CM | POA: Diagnosis not present

## 2022-06-09 DIAGNOSIS — M158 Other polyosteoarthritis: Secondary | ICD-10-CM | POA: Diagnosis not present

## 2022-06-09 DIAGNOSIS — R41841 Cognitive communication deficit: Secondary | ICD-10-CM | POA: Diagnosis not present

## 2022-06-09 DIAGNOSIS — M6281 Muscle weakness (generalized): Secondary | ICD-10-CM | POA: Diagnosis not present

## 2022-06-09 DIAGNOSIS — R2681 Unsteadiness on feet: Secondary | ICD-10-CM | POA: Diagnosis not present

## 2022-06-09 DIAGNOSIS — R2689 Other abnormalities of gait and mobility: Secondary | ICD-10-CM | POA: Diagnosis not present

## 2022-06-09 NOTE — Telephone Encounter (Signed)
I got a call from [place] and she stated that the patient is getting very winded and SOB upon any activity and his hear rate is running from 99-117.  He was still in her office so I informed her I would call the patient once he get home to discuss a appointment. I think he needs to go to UC today. Please advise.  Beatrice Sehgal,cma

## 2022-06-10 ENCOUNTER — Telehealth: Payer: Self-pay | Admitting: Family Medicine

## 2022-06-10 NOTE — Telephone Encounter (Signed)
"  I got a call from [place] and she stated that the patient is getting very winded and SOB upon any activity and his hear rate is running from 99-117.  He was still in her office so I informed her I would call the patient once he get home to discuss a appointment. I think he needs to go to UC today. Please advise.  Nina,cma "  Patient should be evaluated today for this if he has not been seen. He can go to urgent care or the ED for evaluation.

## 2022-06-10 NOTE — Telephone Encounter (Signed)
See other phone message on this. Though please ask him if he is taking flomax as it has not been filled in some time. Thanks.

## 2022-06-10 NOTE — Telephone Encounter (Signed)
Noted. Please try calling again later today.

## 2022-06-10 NOTE — Telephone Encounter (Signed)
Mailbox was full I could not leave a message on mobile and the home number is disconnected.

## 2022-06-13 ENCOUNTER — Ambulatory Visit (INDEPENDENT_AMBULATORY_CARE_PROVIDER_SITE_OTHER): Payer: Medicare Other | Admitting: Family Medicine

## 2022-06-13 ENCOUNTER — Encounter: Payer: Self-pay | Admitting: Family Medicine

## 2022-06-13 VITALS — BP 125/70 | HR 119 | Temp 98.0°F | Ht 64.0 in | Wt 205.0 lb

## 2022-06-13 DIAGNOSIS — N401 Enlarged prostate with lower urinary tract symptoms: Secondary | ICD-10-CM

## 2022-06-13 DIAGNOSIS — R350 Frequency of micturition: Secondary | ICD-10-CM

## 2022-06-13 DIAGNOSIS — E119 Type 2 diabetes mellitus without complications: Secondary | ICD-10-CM | POA: Diagnosis not present

## 2022-06-13 DIAGNOSIS — M158 Other polyosteoarthritis: Secondary | ICD-10-CM | POA: Diagnosis not present

## 2022-06-13 DIAGNOSIS — J449 Chronic obstructive pulmonary disease, unspecified: Secondary | ICD-10-CM | POA: Diagnosis not present

## 2022-06-13 DIAGNOSIS — R2681 Unsteadiness on feet: Secondary | ICD-10-CM | POA: Diagnosis not present

## 2022-06-13 DIAGNOSIS — M6281 Muscle weakness (generalized): Secondary | ICD-10-CM | POA: Diagnosis not present

## 2022-06-13 DIAGNOSIS — R0609 Other forms of dyspnea: Secondary | ICD-10-CM

## 2022-06-13 DIAGNOSIS — R002 Palpitations: Secondary | ICD-10-CM | POA: Diagnosis not present

## 2022-06-13 DIAGNOSIS — N400 Enlarged prostate without lower urinary tract symptoms: Secondary | ICD-10-CM | POA: Insufficient documentation

## 2022-06-13 DIAGNOSIS — R2689 Other abnormalities of gait and mobility: Secondary | ICD-10-CM | POA: Diagnosis not present

## 2022-06-13 DIAGNOSIS — R41841 Cognitive communication deficit: Secondary | ICD-10-CM | POA: Diagnosis not present

## 2022-06-13 DIAGNOSIS — R195 Other fecal abnormalities: Secondary | ICD-10-CM

## 2022-06-13 DIAGNOSIS — I10 Essential (primary) hypertension: Secondary | ICD-10-CM

## 2022-06-13 DIAGNOSIS — H353221 Exudative age-related macular degeneration, left eye, with active choroidal neovascularization: Secondary | ICD-10-CM | POA: Diagnosis not present

## 2022-06-13 DIAGNOSIS — R488 Other symbolic dysfunctions: Secondary | ICD-10-CM | POA: Diagnosis not present

## 2022-06-13 LAB — COMPREHENSIVE METABOLIC PANEL
ALT: 14 U/L (ref 0–53)
AST: 13 U/L (ref 0–37)
Albumin: 4.2 g/dL (ref 3.5–5.2)
Alkaline Phosphatase: 76 U/L (ref 39–117)
BUN: 26 mg/dL — ABNORMAL HIGH (ref 6–23)
CO2: 33 mEq/L — ABNORMAL HIGH (ref 19–32)
Calcium: 9.7 mg/dL (ref 8.4–10.5)
Chloride: 97 mEq/L (ref 96–112)
Creatinine, Ser: 1.09 mg/dL (ref 0.40–1.50)
GFR: 61.12 mL/min (ref >60.00)
Glucose, Bld: 107 mg/dL — ABNORMAL HIGH (ref 70–99)
Potassium: 4.5 mEq/L (ref 3.5–5.1)
Sodium: 138 mEq/L (ref 135–145)
Total Bilirubin: 0.4 mg/dL (ref 0.2–1.2)
Total Protein: 7.4 g/dL (ref 6.0–8.3)

## 2022-06-13 LAB — CBC WITH DIFFERENTIAL/PLATELET
Basophils Absolute: 0 10*3/uL (ref 0.0–0.1)
Basophils Relative: 0.4 % (ref 0.0–3.0)
Eosinophils Absolute: 0.5 10*3/uL (ref 0.0–0.7)
Eosinophils Relative: 3.5 % (ref 0.0–5.0)
HCT: 40.2 % (ref 39.0–52.0)
Hemoglobin: 13 g/dL (ref 13.0–17.0)
Lymphocytes Relative: 14.2 % (ref 12.0–46.0)
Lymphs Abs: 1.8 10*3/uL (ref 0.7–4.0)
MCHC: 32.3 g/dL (ref 30.0–36.0)
MCV: 97.2 fl (ref 78.0–100.0)
Monocytes Absolute: 1.6 10*3/uL — ABNORMAL HIGH (ref 0.1–1.0)
Monocytes Relative: 12.3 % — ABNORMAL HIGH (ref 3.0–12.0)
Neutro Abs: 9 10*3/uL — ABNORMAL HIGH (ref 1.4–7.7)
Neutrophils Relative %: 69.6 % (ref 43.0–77.0)
Platelets: 380 10*3/uL (ref 150.0–400.0)
RBC: 4.13 Mil/uL — ABNORMAL LOW (ref 4.22–5.81)
RDW: 13.2 % (ref 11.5–15.5)
WBC: 13 10*3/uL — ABNORMAL HIGH (ref 4.0–10.5)

## 2022-06-13 LAB — HEMOGLOBIN A1C: Hgb A1c MFr Bld: 6.9 % — ABNORMAL HIGH (ref 4.6–6.5)

## 2022-06-13 LAB — BRAIN NATRIURETIC PEPTIDE: Pro B Natriuretic peptide (BNP): 12 pg/mL (ref 0.0–100.0)

## 2022-06-13 LAB — TSH: TSH: 1.94 u[IU]/mL (ref 0.35–5.50)

## 2022-06-13 MED ORDER — TAMSULOSIN HCL 0.4 MG PO CAPS: 0.4000 mg | ORAL_CAPSULE | Freq: Every day | ORAL | 3 refills | Status: DC

## 2022-06-13 MED ORDER — PREDNISONE 20 MG PO TABS: 40.0000 mg | ORAL_TABLET | Freq: Every day | ORAL | 0 refills | Status: DC

## 2022-06-13 MED ORDER — FINASTERIDE 5 MG PO TABS: 5.0000 mg | ORAL_TABLET | Freq: Every day | ORAL | 1 refills | Status: DC

## 2022-06-13 NOTE — Assessment & Plan Note (Signed)
Poorly controlled.  I suspect this is the cause of his shortness of breath.  It is certainly possible he has some exacerbation underlying given his wheezing.  We will give him a short course of prednisone 40 mg daily for 5 days.  I advised he needs to consistently take his Trelegy on a daily basis.  He can continue as needed use of albuterol.  He will need to follow-up with pulmonology.

## 2022-06-13 NOTE — Assessment & Plan Note (Signed)
EKG is reassuring.  We will check lab work to evaluate for underlying causes.

## 2022-06-13 NOTE — Telephone Encounter (Signed)
Patient is scheduled to be seen today.  Cody Bridges,cma  

## 2022-06-13 NOTE — Patient Instructions (Signed)
Nice to see you. You need to be using your Trelegy on a daily basis.  We are going to treat you with a short course of prednisone for possible COPD exacerbation.  If you have inability to sleep or you are excessively agitated while on the prednisone please discontinue it. Please try to monitor your sugars while you are on it and if they get into the 300s or 400s please let us know right away. Somebody from urology should call you.  I refilled your tamsulosin and we will also put you on finasteride to help with your prostate.   We will contact you with your lab results.

## 2022-06-13 NOTE — Progress Notes (Signed)
Cody Rumps, MD Phone: [phone]  Cody Bridges is a 88 y.o. male who presents today for f/u.  HYPERTENSION Disease Monitoring Chest pain- no    Dyspnea- see below Medications Compliance-  taking amlodipine, HCTZ.  Edema- no BMET    Component Value Date/Time   NA 140 09/10/2021 1511   K 4.1 09/10/2021 1511   CL 99 09/10/2021 1511   CO2 32 09/10/2021 1511   GLUCOSE 103 (H) 09/10/2021 1511   BUN 24 09/10/2021 1511   CREATININE 1.08 09/10/2021 1511   CALCIUM 9.4 09/10/2021 1511   GFRNONAA >60 02/16/2021 0457   DIABETES Disease Monitoring: Blood Sugar ranges-not checking  Medications: Compliance- taking metformin   COPD: Medication compliance- not using trelegy daily  Rescue inhaler use- uses frequently, more than every 6 hours Dyspnea- yes, at rest and on exertion intermittently  Wheezing- yes  Cough- no   Patient notes occasional palpitations.  Diarrhea: Patient reports 1 loose stool this morning.  No loose stools prior to that.  Notes he felt as though it was related to nerves.  BPH: Patient has chronic urine frequency and incontinence.  He has occasional straining.  Nocturia every 1-2 hours.  He notes good flow though does not empty his bladder fully.  He has been taking Flomax though notes he is run out of this recently.  He is not on Proscar or Gemtesa.  He has not seen urology in some time per his report.   Social History   Tobacco Use  Smoking Status Former   Packs/day: 2.00   Years: 35.00   Total pack years: 70.00   Types: Cigarettes   Quit date: 10/30/1985   Years since quitting: 36.6  Smokeless Tobacco Never    Current Outpatient Medications on File Prior to Visit  Medication Sig Dispense Refill   Accu-Chek FastClix Lancets MISC Use up to Once daily as Directed  DX:  E11.9 102 each 0   ACCU-CHEK GUIDE test strip USE UP TO ONCE DAILY AS DIRECTED DX: E11.9 100 strip 0   acetaminophen (TYLENOL) 500 MG tablet Take 1,000 mg by mouth every 6 (six)  hours as needed for mild pain or moderate pain.     albuterol (VENTOLIN HFA) 108 (90 Base) MCG/ACT inhaler INHALE 1-2 PUFFS BY MOUTH EVERY 6 HOURS AS NEEDED FOR WHEEZE OR SHORTNESS OF BREATH 18 each 2   amLODipine (NORVASC) 5 MG tablet Take 5 mg by mouth daily.     azelastine (ASTELIN) 0.1 % nasal spray Place 2 sprays into both nostrils 2 (two) times daily. Use in each nostril as directed 30 mL 12   blood glucose meter kit and supplies KIT Dispense based on patient and insurance preference. Use once daily as directed. (FOR ICD-10  E11.9). 1 each 0   calcium carbonate (TUMS - DOSED IN MG ELEMENTAL CALCIUM) 500 MG chewable tablet Chew 1-2 tablets by mouth 3 (three) times daily as needed (for acid reflux/indigestion.).     carboxymethylcellulose (REFRESH PLUS) 0.5 % SOLN Apply 1 drop to eye 3 (three) times daily as needed (dry eyes).     Cholecalciferol (VITAMIN D-3) 5000 units TABS Take 5,000 Units by mouth daily.      DUPIXENT 300 MG/2ML prefilled syringe INJECT 1 SYRINGE UNDER THE SKIN EVERY OTHER WEEK 12.85 mL 6   Emollient (CERAVE DAILY MOISTURIZING) LOTN Apply 1 application topically daily as needed (dry skin).     fluticasone (FLONASE) 50 MCG/ACT nasal spray Place 2 sprays into both nostrils daily.  Fluticasone-Umeclidin-Vilant (TRELEGY ELLIPTA) 100-62.5-25 MCG/ACT AEPB Inhale 1 puff into the lungs daily. 1 each 11   hydrochlorothiazide (HYDRODIURIL) 25 MG tablet TAKE 1 TABLET BY MOUTH EVERY DAY 90 tablet 0   metFORMIN (GLUCOPHAGE) 500 MG tablet TAKE 1 TABLET BY MOUTH TWICE A DAY 180 tablet 5   mometasone (ELOCON) 0.1 % cream APPLY TWICE DAILY TO AFFECTED AREAS UP TO 2 WEEKS AS NEEDED FOR RASH 45 g 1   Multiple Vitamin (MULTIVITAMIN WITH MINERALS) TABS tablet Take 1 tablet by mouth daily.     Multiple Vitamins-Minerals (PRESERVISION AREDS PO) Take 1 capsule by mouth in the morning and at bedtime.     rosuvastatin (CRESTOR) 20 MG tablet TAKE 1 TABLET BY MOUTH EVERY DAY 90 tablet 1   Current  Facility-Administered Medications on File Prior to Visit  Medication Dose Route Frequency Provider Last Rate Last Admin   dupilumab (DUPIXENT) prefilled syringe 300 mg  300 mg Subcutaneous Q14 Days Ralene Bathe, MD   300 mg at 02/23/22 1000     ROS see history of present illness  Objective  Physical Exam Vitals:   06/13/22 1027  BP: 125/70  Pulse: (!) 119  Temp: 98 F (36.7 C)  SpO2: 96%    BP Readings from Last 3 Encounters:  06/13/22 125/70  01/20/22 120/68  12/10/21 123/70   Wt Readings from Last 3 Encounters:  06/13/22 205 lb (93 kg)  01/20/22 205 lb 6.4 oz (93.2 kg)  12/10/21 204 lb 12.8 oz (92.9 kg)    Physical Exam Constitutional:      General: He is not in acute distress.    Appearance: He is not diaphoretic.  Cardiovascular:     Rate and Rhythm: Regular rhythm. Tachycardia present.     Heart sounds: Normal heart sounds.  Pulmonary:     Effort: Pulmonary effort is normal.     Breath sounds: Normal breath sounds.  Skin:    General: Skin is warm and dry.  Neurological:     Mental Status: He is alert.    EKG: Sinus tachycardia, rate 106, right bundle branch block, no significant changes his most recent EKG  Assessment/Plan: Please see individual problem list.  Problem List Items Addressed This Visit     BPH (benign prostatic hyperplasia) (Chronic)    Uncontrolled.  We will have him resume his Flomax 0.4 mg daily and will start on Proscar 5 mg daily.  I will refer to urology as well.      Relevant Medications   tamsulosin (FLOMAX) 0.4 MG CAPS capsule   finasteride (PROSCAR) 5 MG tablet   Other Relevant Orders   Ambulatory referral to Urology   COPD (chronic obstructive pulmonary disease) ([place]) (Chronic)    Poorly controlled.  I suspect this is the cause of his shortness of breath.  It is certainly possible he has some exacerbation underlying given his wheezing.  We will give him a short course of prednisone 40 mg daily for 5 days.  I advised he  needs to consistently take his Trelegy on a daily basis.  He can continue as needed use of albuterol.  He will need to follow-up with pulmonology.      Relevant Medications   predniSONE (DELTASONE) 20 MG tablet   Diabetes mellitus without complication (HCC) (Chronic)    Check A1c.  Continue metformin 500 mg twice daily.      Relevant Orders   HgB A1c   Hypertension - Primary (Chronic)    Adequately controlled.  He  will continue HCTZ 25 mg daily and amlodipine 5 mg daily.      DOE (dyspnea on exertion)    Likely related to his COPD though we are checking lab work to evaluate for other underlying causes.      Relevant Orders   CBC w/Diff   Comp Met (CMET)   TSH   B Nat Peptide   Loose stools    Patient had a single loose stool.  If he has any persistent recurrence he will let us know.      Palpitations    EKG is reassuring.  We will check lab work to evaluate for underlying causes.      Relevant Orders   EKG 12-Lead (Completed)   TSH     Return in about 3 months (around 09/13/2022).   Cody Rumps, MD [place]

## 2022-06-13 NOTE — Assessment & Plan Note (Signed)
Likely related to his COPD though we are checking lab work to evaluate for other underlying causes.

## 2022-06-13 NOTE — Assessment & Plan Note (Signed)
Check A1c.  Continue metformin 500 mg twice daily.

## 2022-06-13 NOTE — Assessment & Plan Note (Signed)
Patient had a single loose stool.  If he has any persistent recurrence he will let us know.

## 2022-06-13 NOTE — Assessment & Plan Note (Signed)
Uncontrolled.  We will have him resume his Flomax 0.4 mg daily and will start on Proscar 5 mg daily.  I will refer to urology as well.

## 2022-06-13 NOTE — Assessment & Plan Note (Signed)
Adequately controlled.  He will continue HCTZ 25 mg daily and amlodipine 5 mg daily.

## 2022-06-14 ENCOUNTER — Telehealth: Payer: Self-pay

## 2022-06-14 NOTE — Telephone Encounter (Signed)
Called pt to disc about lab results. Unable to reach nor lvm due to vm being full.  Per Dr.Sonnenberg: Please let the patient know that his white blood cell count is up mildly. This could be from inflammation or infection or some other cause. Has he had any fevers recently? We could get a CXR on him given his breathing issues to see if there is any underlying lung infection that I did not hear on my exam. I can order this if he is ok having it done. His A1c is adequately controlled. His other labs are acceptable.

## 2022-06-15 ENCOUNTER — Ambulatory Visit (INDEPENDENT_AMBULATORY_CARE_PROVIDER_SITE_OTHER): Payer: Medicare Other | Admitting: Dermatology

## 2022-06-15 DIAGNOSIS — L209 Atopic dermatitis, unspecified: Secondary | ICD-10-CM | POA: Diagnosis not present

## 2022-06-15 MED ORDER — DUPILUMAB 300 MG/2ML ~~LOC~~ SOSY
300.0000 mg | PREFILLED_SYRINGE | Freq: Once | SUBCUTANEOUS | Status: AC
  Administered 2022-06-15: 300 mg via SUBCUTANEOUS

## 2022-06-15 NOTE — Progress Notes (Signed)
Patient here today for two week Dupixent injection for severe atopic dermatitis. Dupixent '300mg'$ /46m injected into right upper arm. Patient tolerated well.    [id number]: 04/2024 [id number]  ADicie BeamRMA Documentation: I have reviewed the above documentation for accuracy and completeness, and I agree with the above.  DSarina Ser MD

## 2022-06-16 ENCOUNTER — Other Ambulatory Visit: Payer: Self-pay | Admitting: Family Medicine

## 2022-06-16 DIAGNOSIS — R488 Other symbolic dysfunctions: Secondary | ICD-10-CM | POA: Diagnosis not present

## 2022-06-16 DIAGNOSIS — M6281 Muscle weakness (generalized): Secondary | ICD-10-CM | POA: Diagnosis not present

## 2022-06-16 DIAGNOSIS — R41841 Cognitive communication deficit: Secondary | ICD-10-CM | POA: Diagnosis not present

## 2022-06-16 DIAGNOSIS — R2681 Unsteadiness on feet: Secondary | ICD-10-CM | POA: Diagnosis not present

## 2022-06-16 DIAGNOSIS — R2689 Other abnormalities of gait and mobility: Secondary | ICD-10-CM | POA: Diagnosis not present

## 2022-06-16 DIAGNOSIS — M158 Other polyosteoarthritis: Secondary | ICD-10-CM | POA: Diagnosis not present

## 2022-06-16 DIAGNOSIS — J449 Chronic obstructive pulmonary disease, unspecified: Secondary | ICD-10-CM

## 2022-06-17 ENCOUNTER — Encounter: Payer: Self-pay | Admitting: Dermatology

## 2022-06-17 DIAGNOSIS — R488 Other symbolic dysfunctions: Secondary | ICD-10-CM | POA: Diagnosis not present

## 2022-06-17 DIAGNOSIS — R2681 Unsteadiness on feet: Secondary | ICD-10-CM | POA: Diagnosis not present

## 2022-06-17 DIAGNOSIS — R2689 Other abnormalities of gait and mobility: Secondary | ICD-10-CM | POA: Diagnosis not present

## 2022-06-17 DIAGNOSIS — M6281 Muscle weakness (generalized): Secondary | ICD-10-CM | POA: Diagnosis not present

## 2022-06-17 DIAGNOSIS — R41841 Cognitive communication deficit: Secondary | ICD-10-CM | POA: Diagnosis not present

## 2022-06-17 DIAGNOSIS — M158 Other polyosteoarthritis: Secondary | ICD-10-CM | POA: Diagnosis not present

## 2022-06-22 ENCOUNTER — Ambulatory Visit: Payer: Medicare Other | Admitting: Family Medicine

## 2022-06-22 DIAGNOSIS — R488 Other symbolic dysfunctions: Secondary | ICD-10-CM | POA: Diagnosis not present

## 2022-06-22 DIAGNOSIS — M158 Other polyosteoarthritis: Secondary | ICD-10-CM | POA: Diagnosis not present

## 2022-06-22 DIAGNOSIS — R41841 Cognitive communication deficit: Secondary | ICD-10-CM | POA: Diagnosis not present

## 2022-06-22 DIAGNOSIS — M6281 Muscle weakness (generalized): Secondary | ICD-10-CM | POA: Diagnosis not present

## 2022-06-22 DIAGNOSIS — R2689 Other abnormalities of gait and mobility: Secondary | ICD-10-CM | POA: Diagnosis not present

## 2022-06-22 DIAGNOSIS — R2681 Unsteadiness on feet: Secondary | ICD-10-CM | POA: Diagnosis not present

## 2022-06-24 DIAGNOSIS — R2681 Unsteadiness on feet: Secondary | ICD-10-CM | POA: Diagnosis not present

## 2022-06-24 DIAGNOSIS — R41841 Cognitive communication deficit: Secondary | ICD-10-CM | POA: Diagnosis not present

## 2022-06-24 DIAGNOSIS — R2689 Other abnormalities of gait and mobility: Secondary | ICD-10-CM | POA: Diagnosis not present

## 2022-06-24 DIAGNOSIS — M6281 Muscle weakness (generalized): Secondary | ICD-10-CM | POA: Diagnosis not present

## 2022-06-24 DIAGNOSIS — M158 Other polyosteoarthritis: Secondary | ICD-10-CM | POA: Diagnosis not present

## 2022-06-24 DIAGNOSIS — R488 Other symbolic dysfunctions: Secondary | ICD-10-CM | POA: Diagnosis not present

## 2022-06-27 ENCOUNTER — Ambulatory Visit (INDEPENDENT_AMBULATORY_CARE_PROVIDER_SITE_OTHER): Payer: Medicare Other | Admitting: Physician Assistant

## 2022-06-27 VITALS — BP 103/63 | HR 123 | Ht 64.0 in | Wt 205.0 lb

## 2022-06-27 DIAGNOSIS — N401 Enlarged prostate with lower urinary tract symptoms: Secondary | ICD-10-CM

## 2022-06-27 DIAGNOSIS — N3941 Urge incontinence: Secondary | ICD-10-CM | POA: Diagnosis not present

## 2022-06-27 DIAGNOSIS — R350 Frequency of micturition: Secondary | ICD-10-CM

## 2022-06-27 DIAGNOSIS — N3281 Overactive bladder: Secondary | ICD-10-CM | POA: Diagnosis not present

## 2022-06-27 LAB — BLADDER SCAN AMB NON-IMAGING: Scan Result: 0

## 2022-06-27 MED ORDER — FINASTERIDE 5 MG PO TABS: 5.0000 mg | ORAL_TABLET | Freq: Every day | ORAL | 11 refills | Status: AC

## 2022-06-27 MED ORDER — TAMSULOSIN HCL 0.4 MG PO CAPS: 0.4000 mg | ORAL_CAPSULE | Freq: Every day | ORAL | 11 refills | Status: AC

## 2022-06-27 MED ORDER — GEMTESA 75 MG PO TABS: 75.0000 mg | ORAL_TABLET | Freq: Every day | ORAL | 0 refills | Status: DC

## 2022-06-27 NOTE — Patient Instructions (Signed)
Restart Proscar (finasteride) and Flomax (tamsulosin). I sent refills of these medications to your pharmacy.  I am giving you samples of Gemtesa (vibegron) to take. Please take this once daily for 6 weeks to help with your urinary urgency and leakage. It can take up to 2 weeks to notice a difference on this medication.  Please see information below about the penile incontinence clamp and condom catheters. If you find a size of condom catheter that works well for you and you want these sent to your home for regular use, please call our office and let us know so we can place an order for you.  Condom Cath Patient Instruction Guide  Applying the male external catheter    Use the sizing guide to ensure a proper fit of the Male external catheter Always measure for correct size (both circumference and length with appropriate measuring guide for specific product) Opening the male external catheter   Flip open the pack using your thumb nail to break the seal.  remove catheter from package  Remove the catheter from the package and place it over the head of the penis. Leave a small space between the end of the penis and the narrow catheter outlet. Uncircumcised users should leave the foreskin in place over the head of the penis. male external catheter application   Pull the double grip strip to slowly unroll the catheter all the way up the length of the penis. The catheter should unroll smoothly and evenly. squeeze catheter to apply   Gently squeeze the catheter around the shaft of the penis (approx. 1 min.) to ensure a secure fit. To reduce the risk of skin irritation, allow the skin on the penis to breathe for short periods in between male external catheter changes.     Connecting the Urine Bag Connecting the [facility] to the Urine bag  Connect a urine collecting bag to the catheter by fully inserting the tubing connector into the external catheter outlet. Push together firmly for a secure  connection.    Removal is easy and painless. The catheter can be removed by detaching the catheter from the urine bag connector and carefully rolling it off the penis. If you need to, use warm soapy water to help remove the catheter. It can then be disposed of in the trash, with general household waste. Finish by washing your hands. Change the male external catheter everyday and change the bag according to recommendations from your healthcare professional.    Penile Incontinence Clamp    [email or web address]  The Wiesner Incontinence Clamp is an external medical device used to control urine leakage by compressing the urethra and preventing the flow of urine   Lets you maintain your active lifestyle Cost effective, saving you lots of money on adult incontinence briefs Can be worn during any activity Ergonomic design promotes confidence and provides all-day comfort  Step 1: Open the incontinence clamp by releasing the catch and lifting up the top part.   Step 2: Place your penis between the silicone rubber pads with the incontinence clamp about halfway down the shaft of your penis.   Step 3: Latch the incontinence clamp to compress the urethra at the level that's comfortable to you.

## 2022-06-27 NOTE — Progress Notes (Signed)
06/27/2022 3:50 PM   Cody Bridges 1933/07/18 [id number]  CC: Chief Complaint  Patient presents with   Urinary Frequency   HPI: Cody Bridges is a 88 y.o. male with PMH BPH, bladder herniation into right inguinal hernia, diabetes, and OAB wet who presents today for follow up of OAB. He is accompanied today by his wife, who contributes to HPI.  He last saw Cody Bridges on 02/03/2021, at which time they elected to pursue PTNS, however this never occurred.   Today he reports ongoing urinary urgency and urge incontinence. He is curious about condom catheters. They plan to travel to [place] over Christmas and he is self conscious about using absorbant underwear while out of town. He uses 4 Depends daily.  History today is a bit challenging. He was previously on finasteride and Flomax, but unclear if he is still taking these. There are notes in his chart about being on Gemtesa, but today they both deny he ever took this.  In-office UA today positive for trace intact blood and trace leukocytes; urine microscopy pan negative. PVR 40m.  PMH: Past Medical History:  Diagnosis Date   Arthritis    Asthma    COPD (chronic obstructive pulmonary disease) ([facility]    COVID-19 02/13/2021   Diabetes mellitus without complication ([facility]     [address]  Dyspnea    with exertion   Edema    GERD (gastroesophageal reflux disease)    HOH (hard of hearing)    AIDS   Hyperlipidemia    Hypertension    Wheezing     Surgical History: Past Surgical History:  Procedure Laterality Date   BUNIONECTOMY Right    CATARACT EXTRACTION W/PHACO Left 08/22/2017   Procedure: CATARACT EXTRACTION PHACO AND INTRAOCULAR LENS PLACEMENT ([place];  Surgeon: PBirder Robson MD;  Location: [facility] ORS;  Service: Ophthalmology;  Laterality: Left;  [place]:43.7 AP% 12.3 CDE 5.38 Fluid pack lot # [id number]   COLONOSCOPY  2016   ELECTROMAGNETIC NAVIGATION BROCHOSCOPY N/A 02/09/2017   Procedure: ELECTROMAGNETIC NAVIGATION  BRONCHOSCOPY;  Surgeon: KFlora Lipps MD;  Location: [facility] ORS;  Service: Cardiopulmonary;  Laterality: N/A;   EYE SURGERY Right    Cataract Extraction with IOL   HAMMER TOE SURGERY Right    TONSILLECTOMY  1938    Home Medications:  Allergies as of 06/27/2022       Reactions   Clindamycin Hives   Lisinopril Cough   Penicillin V Potassium Rash, Other (See Comments)   Has patient had a PCN reaction causing immediate rash, facial/tongue/throat swelling, SOB or lightheadedness with hypotension: Unknown Has patient had a PCN reaction causing severe rash involving mucus membranes or skin necrosis: Unknown Has patient had a PCN reaction that required hospitalization: No Has patient had a PCN reaction occurring within the last 10 years: No If all of the above answers are "NO", then may proceed with Cephalosporin use.        Medication List        Accurate as of June 27, 2022  3:50 PM. If you have any questions, ask your nurse or doctor.          Accu-Chek FastClix Lancets Misc Use up to Once daily as Directed  DX:  E11.9   Accu-Chek Guide test strip Generic drug: glucose blood USE UP TO ONCE DAILY AS DIRECTED DX: E11.9   acetaminophen 500 MG tablet Commonly known as: TYLENOL Take 1,000 mg by mouth every 6 (six) hours as needed for mild pain or moderate  pain.   albuterol 108 (90 Base) MCG/ACT inhaler Commonly known as: VENTOLIN HFA INHALE 1-2 PUFFS BY MOUTH EVERY 6 HOURS AS NEEDED FOR WHEEZE OR SHORTNESS OF BREATH   amLODipine 5 MG tablet Commonly known as: NORVASC Take 5 mg by mouth daily.   azelastine 0.1 % nasal spray Commonly known as: ASTELIN Place 2 sprays into both nostrils 2 (two) times daily. Use in each nostril as directed   blood glucose meter kit and supplies Kit Dispense based on patient and insurance preference. Use once daily as directed. (FOR ICD-10  E11.9).   calcium carbonate 500 MG chewable tablet Commonly known as: TUMS - dosed in mg elemental  calcium Chew 1-2 tablets by mouth 3 (three) times daily as needed (for acid reflux/indigestion.).   carboxymethylcellulose 0.5 % Soln Commonly known as: REFRESH PLUS Apply 1 drop to eye 3 (three) times daily as needed (dry eyes).   CeraVe Daily Moisturizing Lotn Apply 1 application topically daily as needed (dry skin).   Dupixent 300 MG/2ML prefilled syringe Generic drug: dupilumab INJECT 1 SYRINGE UNDER THE SKIN EVERY OTHER WEEK   finasteride 5 MG tablet Commonly known as: PROSCAR Take 1 tablet (5 mg total) by mouth daily.   fluticasone 50 MCG/ACT nasal spray Commonly known as: FLONASE Place 2 sprays into both nostrils daily.   Gemtesa 75 MG Tabs Generic drug: Vibegron Take 75 mg by mouth daily.   hydrochlorothiazide 25 MG tablet Commonly known as: HYDRODIURIL TAKE 1 TABLET BY MOUTH EVERY DAY   metFORMIN 500 MG tablet Commonly known as: GLUCOPHAGE TAKE 1 TABLET BY MOUTH TWICE A DAY   mometasone 0.1 % cream Commonly known as: ELOCON APPLY TWICE DAILY TO AFFECTED AREAS UP TO 2 WEEKS AS NEEDED FOR RASH   multivitamin with minerals Tabs tablet Take 1 tablet by mouth daily.   predniSONE 20 MG tablet Commonly known as: DELTASONE Take 2 tablets (40 mg total) by mouth daily with breakfast.   PRESERVISION AREDS PO Take 1 capsule by mouth in the morning and at bedtime.   rosuvastatin 20 MG tablet Commonly known as: CRESTOR TAKE 1 TABLET BY MOUTH EVERY DAY   tamsulosin 0.4 MG Caps capsule Commonly known as: FLOMAX Take 1 capsule (0.4 mg total) by mouth daily.   Trelegy Ellipta 100-62.5-25 MCG/ACT Aepb Generic drug: Fluticasone-Umeclidin-Vilant Inhale 1 puff into the lungs daily.   Vitamin D-3 125 MCG (5000 UT) Tabs Take 5,000 Units by mouth daily.        Allergies:  Allergies  Allergen Reactions   Clindamycin Hives   Lisinopril Cough   Penicillin V Potassium Rash and Other (See Comments)    Has patient had a PCN reaction causing immediate rash,  facial/tongue/throat swelling, SOB or lightheadedness with hypotension: Unknown Has patient had a PCN reaction causing severe rash involving mucus membranes or skin necrosis: Unknown Has patient had a PCN reaction that required hospitalization: No Has patient had a PCN reaction occurring within the last 10 years: No If all of the above answers are "NO", then may proceed with Cephalosporin use.     Family History: Family History  Problem Relation Age of Onset   Cancer Mother    Stomach cancer Mother    Stomach cancer Sister     Social History:   reports that he quit smoking about 36 years ago. His smoking use included cigarettes. He has a 70.00 pack-year smoking history. He has never used smokeless tobacco. He reports current alcohol use. He reports that he does not  use drugs.  Physical Exam: BP 103/63   Pulse (!) 123   Ht _0  (1.626 m)   Wt 205 lb (93 kg)   BMI 35.19 kg/m   Constitutional:  Alert and oriented, no acute distress, nontoxic appearing HEENT: [place], AT Cardiovascular: No clubbing, cyanosis, or edema Respiratory: Normal respiratory effort, no increased work of breathing Skin: No rashes, bruises or suspicious lesions Neurologic: Grossly intact, no focal deficits, moving all 4 extremities Psychiatric: Normal mood and affect  Laboratory Data: Results for orders placed or performed in visit on 06/27/22  Bladder Scan (Post Void Residual) in office  Result Value Ref Range   Scan Result 0    Assessment & Plan:   1. Urge incontinence Not on Gemtesa; patient denies ever taking this though there are notes to the contrary. I gave him 6 weeks of samples today and will reassess his symptoms upon completion; if no better or not at treatment goal, recommend pursuing PTNS at that point. - Urinalysis, Complete - Bladder Scan (Post Void Residual) in office - Vibegron (GEMTESA) 75 MG TABS; Take 75 mg by mouth daily.  Dispense: 42 tablet; Refill: 0  2. Benign prostatic  hyperplasia with urinary frequency Refilling Flomax and finasteride, unclear if he is still taking one or both of these but with his BPH I recommend he take both. - finasteride (PROSCAR) 5 MG tablet; Take 1 tablet (5 mg total) by mouth daily.  Dispense: 30 tablet; Refill: 11 - tamsulosin (FLOMAX) 0.4 MG CAPS capsule; Take 1 capsule (0.4 mg total) by mouth daily.  Dispense: 30 capsule; Refill: 11  Return in about 6 weeks (around 08/08/2022) for Symptom recheck with IPSS, PVR.  Debroah Loop, PA-C  [facility] [address] [place], [place] [address]

## 2022-06-28 LAB — URINALYSIS, COMPLETE
Bilirubin, UA: NEGATIVE
Glucose, UA: NEGATIVE
Ketones, UA: NEGATIVE
Nitrite, UA: NEGATIVE
Protein,UA: NEGATIVE
Specific Gravity, UA: 1.01 (ref 1.005–1.030)
Urobilinogen, Ur: 0.2 mg/dL (ref 0.2–1.0)
pH, UA: 7 (ref 5.0–7.5)

## 2022-06-28 LAB — MICROSCOPIC EXAMINATION: Bacteria, UA: NONE SEEN

## 2022-06-29 ENCOUNTER — Ambulatory Visit: Payer: Medicare Other

## 2022-06-29 DIAGNOSIS — R41841 Cognitive communication deficit: Secondary | ICD-10-CM | POA: Diagnosis not present

## 2022-06-29 DIAGNOSIS — R2689 Other abnormalities of gait and mobility: Secondary | ICD-10-CM | POA: Diagnosis not present

## 2022-06-29 DIAGNOSIS — M6281 Muscle weakness (generalized): Secondary | ICD-10-CM | POA: Diagnosis not present

## 2022-06-29 DIAGNOSIS — M158 Other polyosteoarthritis: Secondary | ICD-10-CM | POA: Diagnosis not present

## 2022-06-29 DIAGNOSIS — R2681 Unsteadiness on feet: Secondary | ICD-10-CM | POA: Diagnosis not present

## 2022-06-29 DIAGNOSIS — R488 Other symbolic dysfunctions: Secondary | ICD-10-CM | POA: Diagnosis not present

## 2022-06-30 ENCOUNTER — Ambulatory Visit (INDEPENDENT_AMBULATORY_CARE_PROVIDER_SITE_OTHER): Payer: Medicare Other | Admitting: Dermatology

## 2022-06-30 DIAGNOSIS — L209 Atopic dermatitis, unspecified: Secondary | ICD-10-CM | POA: Diagnosis not present

## 2022-06-30 MED ORDER — DUPILUMAB 300 MG/2ML ~~LOC~~ SOSY
300.0000 mg | PREFILLED_SYRINGE | Freq: Once | SUBCUTANEOUS | Status: AC
  Administered 2022-06-30: 300 mg via SUBCUTANEOUS

## 2022-06-30 NOTE — Progress Notes (Signed)
Patient here today for two week Dupixent injection for severe atopic dermatitis. Dupixent '300mg'$ /63m injected into left upper arm. Patient tolerated well.    [id number]: 07/2024 NLJQ:[id number]  ADicie BeamRMA Documentation: I have reviewed the above documentation for accuracy and completeness, and I agree with the above.  DSarina Ser MD

## 2022-07-01 DIAGNOSIS — R2689 Other abnormalities of gait and mobility: Secondary | ICD-10-CM | POA: Diagnosis not present

## 2022-07-01 DIAGNOSIS — M6281 Muscle weakness (generalized): Secondary | ICD-10-CM | POA: Diagnosis not present

## 2022-07-01 DIAGNOSIS — M158 Other polyosteoarthritis: Secondary | ICD-10-CM | POA: Diagnosis not present

## 2022-07-01 DIAGNOSIS — R2681 Unsteadiness on feet: Secondary | ICD-10-CM | POA: Diagnosis not present

## 2022-07-05 DIAGNOSIS — M6281 Muscle weakness (generalized): Secondary | ICD-10-CM | POA: Diagnosis not present

## 2022-07-05 DIAGNOSIS — M158 Other polyosteoarthritis: Secondary | ICD-10-CM | POA: Diagnosis not present

## 2022-07-05 DIAGNOSIS — R2681 Unsteadiness on feet: Secondary | ICD-10-CM | POA: Diagnosis not present

## 2022-07-05 DIAGNOSIS — R2689 Other abnormalities of gait and mobility: Secondary | ICD-10-CM | POA: Diagnosis not present

## 2022-07-07 DIAGNOSIS — M158 Other polyosteoarthritis: Secondary | ICD-10-CM | POA: Diagnosis not present

## 2022-07-07 DIAGNOSIS — R2689 Other abnormalities of gait and mobility: Secondary | ICD-10-CM | POA: Diagnosis not present

## 2022-07-07 DIAGNOSIS — M6281 Muscle weakness (generalized): Secondary | ICD-10-CM | POA: Diagnosis not present

## 2022-07-07 DIAGNOSIS — R2681 Unsteadiness on feet: Secondary | ICD-10-CM | POA: Diagnosis not present

## 2022-07-08 ENCOUNTER — Ambulatory Visit: Payer: Medicare Other | Admitting: Family

## 2022-07-12 ENCOUNTER — Ambulatory Visit: Payer: Medicare Other | Admitting: Family Medicine

## 2022-07-12 DIAGNOSIS — B351 Tinea unguium: Secondary | ICD-10-CM | POA: Diagnosis not present

## 2022-07-12 DIAGNOSIS — L851 Acquired keratosis [keratoderma] palmaris et plantaris: Secondary | ICD-10-CM | POA: Diagnosis not present

## 2022-07-12 DIAGNOSIS — E1142 Type 2 diabetes mellitus with diabetic polyneuropathy: Secondary | ICD-10-CM | POA: Diagnosis not present

## 2022-07-13 ENCOUNTER — Ambulatory Visit: Payer: Medicare Other | Admitting: Family Medicine

## 2022-07-13 ENCOUNTER — Ambulatory Visit: Payer: Medicare Other

## 2022-07-13 DIAGNOSIS — M6281 Muscle weakness (generalized): Secondary | ICD-10-CM | POA: Diagnosis not present

## 2022-07-13 DIAGNOSIS — R2689 Other abnormalities of gait and mobility: Secondary | ICD-10-CM | POA: Diagnosis not present

## 2022-07-13 DIAGNOSIS — R2681 Unsteadiness on feet: Secondary | ICD-10-CM | POA: Diagnosis not present

## 2022-07-13 DIAGNOSIS — M158 Other polyosteoarthritis: Secondary | ICD-10-CM | POA: Diagnosis not present

## 2022-07-14 ENCOUNTER — Other Ambulatory Visit: Payer: Self-pay | Admitting: Family Medicine

## 2022-07-14 ENCOUNTER — Ambulatory Visit: Payer: Medicare Other

## 2022-07-14 DIAGNOSIS — L209 Atopic dermatitis, unspecified: Secondary | ICD-10-CM

## 2022-07-14 MED ORDER — DUPILUMAB 300 MG/2ML ~~LOC~~ SOSY: 300.0000 mg | PREFILLED_SYRINGE | Freq: Once | SUBCUTANEOUS | Status: DC

## 2022-07-14 NOTE — Progress Notes (Signed)
Patient here today for two week Dupixent injection for severe atopic dermatitis. Dupixent 300mg/2mL injected into right upper arm. Patient tolerated well.    [id number]:[id number] EXP: 07/2024 NDC:[id number]   Olita Takeshita RMA   

## 2022-07-15 DIAGNOSIS — M158 Other polyosteoarthritis: Secondary | ICD-10-CM | POA: Diagnosis not present

## 2022-07-15 DIAGNOSIS — R2689 Other abnormalities of gait and mobility: Secondary | ICD-10-CM | POA: Diagnosis not present

## 2022-07-15 DIAGNOSIS — R2681 Unsteadiness on feet: Secondary | ICD-10-CM | POA: Diagnosis not present

## 2022-07-15 DIAGNOSIS — M6281 Muscle weakness (generalized): Secondary | ICD-10-CM | POA: Diagnosis not present

## 2022-07-16 ENCOUNTER — Encounter: Payer: Self-pay | Admitting: Dermatology

## 2022-07-20 DIAGNOSIS — M6281 Muscle weakness (generalized): Secondary | ICD-10-CM | POA: Diagnosis not present

## 2022-07-20 DIAGNOSIS — R2689 Other abnormalities of gait and mobility: Secondary | ICD-10-CM | POA: Diagnosis not present

## 2022-07-20 DIAGNOSIS — R2681 Unsteadiness on feet: Secondary | ICD-10-CM | POA: Diagnosis not present

## 2022-07-20 DIAGNOSIS — M158 Other polyosteoarthritis: Secondary | ICD-10-CM | POA: Diagnosis not present

## 2022-07-28 ENCOUNTER — Ambulatory Visit: Payer: Medicare Other

## 2022-07-29 ENCOUNTER — Other Ambulatory Visit: Payer: Self-pay | Admitting: Family Medicine

## 2022-07-29 DIAGNOSIS — M158 Other polyosteoarthritis: Secondary | ICD-10-CM | POA: Diagnosis not present

## 2022-07-29 DIAGNOSIS — J449 Chronic obstructive pulmonary disease, unspecified: Secondary | ICD-10-CM

## 2022-07-29 DIAGNOSIS — R2689 Other abnormalities of gait and mobility: Secondary | ICD-10-CM | POA: Diagnosis not present

## 2022-07-29 DIAGNOSIS — R2681 Unsteadiness on feet: Secondary | ICD-10-CM | POA: Diagnosis not present

## 2022-07-29 DIAGNOSIS — M6281 Muscle weakness (generalized): Secondary | ICD-10-CM | POA: Diagnosis not present

## 2022-07-30 ENCOUNTER — Other Ambulatory Visit: Payer: Self-pay | Admitting: Family Medicine

## 2022-07-30 DIAGNOSIS — J309 Allergic rhinitis, unspecified: Secondary | ICD-10-CM

## 2022-08-02 ENCOUNTER — Ambulatory Visit: Payer: Medicare Other

## 2022-08-03 DIAGNOSIS — R2689 Other abnormalities of gait and mobility: Secondary | ICD-10-CM | POA: Diagnosis not present

## 2022-08-03 DIAGNOSIS — M6281 Muscle weakness (generalized): Secondary | ICD-10-CM | POA: Diagnosis not present

## 2022-08-03 DIAGNOSIS — R2681 Unsteadiness on feet: Secondary | ICD-10-CM | POA: Diagnosis not present

## 2022-08-03 DIAGNOSIS — M158 Other polyosteoarthritis: Secondary | ICD-10-CM | POA: Diagnosis not present

## 2022-08-04 ENCOUNTER — Ambulatory Visit (INDEPENDENT_AMBULATORY_CARE_PROVIDER_SITE_OTHER): Payer: Medicare Other

## 2022-08-04 DIAGNOSIS — L209 Atopic dermatitis, unspecified: Secondary | ICD-10-CM

## 2022-08-04 MED ORDER — DUPILUMAB 300 MG/2ML ~~LOC~~ SOSY
300.0000 mg | PREFILLED_SYRINGE | Freq: Once | SUBCUTANEOUS | Status: AC
  Administered 2022-08-04: 300 mg via SUBCUTANEOUS

## 2022-08-04 NOTE — Progress Notes (Signed)
Patient here today for two week Dupixent injection for severe atopic dermatitis. Dupixent 300mg/2mL injected into left upper arm. Patient tolerated well.    [id number]:[id number] EXP: 07/2024 NDC:[id number]   Irianna Gilday RMA 

## 2022-08-05 DIAGNOSIS — M6281 Muscle weakness (generalized): Secondary | ICD-10-CM | POA: Diagnosis not present

## 2022-08-05 DIAGNOSIS — M158 Other polyosteoarthritis: Secondary | ICD-10-CM | POA: Diagnosis not present

## 2022-08-05 DIAGNOSIS — R2689 Other abnormalities of gait and mobility: Secondary | ICD-10-CM | POA: Diagnosis not present

## 2022-08-05 DIAGNOSIS — R2681 Unsteadiness on feet: Secondary | ICD-10-CM | POA: Diagnosis not present

## 2022-08-08 ENCOUNTER — Telehealth: Payer: Self-pay | Admitting: Family Medicine

## 2022-08-08 ENCOUNTER — Ambulatory Visit: Payer: 59 | Admitting: Physician Assistant

## 2022-08-08 NOTE — Telephone Encounter (Signed)
Patient just had his Azelastine HCl 137 MCG/SPRAY SOLN refilled, but he has gone through the entire bottle and the pharmacy will not refill. Patient is asking Dr Caryl Bis to refill.

## 2022-08-08 NOTE — Telephone Encounter (Signed)
Noted. Please reach back out to him on this tomorrow.

## 2022-08-08 NOTE — Telephone Encounter (Signed)
Medication was refilled on 08/02/2022. I called the patient and LVM to call back to clarify if he picked up the new RX.  Marchell Froman,cma

## 2022-08-10 NOTE — Telephone Encounter (Signed)
I called the patient and he stated he did not pick up the prescription written on 08/02/2022 but he would go and pick it up.  Carmell Elgin,cma

## 2022-08-15 DIAGNOSIS — R2689 Other abnormalities of gait and mobility: Secondary | ICD-10-CM | POA: Diagnosis not present

## 2022-08-15 DIAGNOSIS — R2681 Unsteadiness on feet: Secondary | ICD-10-CM | POA: Diagnosis not present

## 2022-08-15 DIAGNOSIS — M6281 Muscle weakness (generalized): Secondary | ICD-10-CM | POA: Diagnosis not present

## 2022-08-15 DIAGNOSIS — M158 Other polyosteoarthritis: Secondary | ICD-10-CM | POA: Diagnosis not present

## 2022-08-18 ENCOUNTER — Ambulatory Visit: Payer: Medicare Other

## 2022-08-19 ENCOUNTER — Ambulatory Visit: Payer: Medicare Other | Admitting: Family Medicine

## 2022-08-22 ENCOUNTER — Other Ambulatory Visit: Payer: Self-pay | Admitting: Family Medicine

## 2022-08-23 DIAGNOSIS — M6281 Muscle weakness (generalized): Secondary | ICD-10-CM | POA: Diagnosis not present

## 2022-08-23 DIAGNOSIS — R2681 Unsteadiness on feet: Secondary | ICD-10-CM | POA: Diagnosis not present

## 2022-08-23 DIAGNOSIS — M158 Other polyosteoarthritis: Secondary | ICD-10-CM | POA: Diagnosis not present

## 2022-08-23 DIAGNOSIS — R2689 Other abnormalities of gait and mobility: Secondary | ICD-10-CM | POA: Diagnosis not present

## 2022-08-27 DIAGNOSIS — R2689 Other abnormalities of gait and mobility: Secondary | ICD-10-CM | POA: Diagnosis not present

## 2022-08-27 DIAGNOSIS — M6281 Muscle weakness (generalized): Secondary | ICD-10-CM | POA: Diagnosis not present

## 2022-08-27 DIAGNOSIS — M158 Other polyosteoarthritis: Secondary | ICD-10-CM | POA: Diagnosis not present

## 2022-08-27 DIAGNOSIS — R2681 Unsteadiness on feet: Secondary | ICD-10-CM | POA: Diagnosis not present

## 2022-08-28 ENCOUNTER — Other Ambulatory Visit: Payer: Self-pay | Admitting: Family Medicine

## 2022-08-29 ENCOUNTER — Other Ambulatory Visit: Payer: Self-pay | Admitting: Family Medicine

## 2022-08-29 DIAGNOSIS — M6281 Muscle weakness (generalized): Secondary | ICD-10-CM | POA: Diagnosis not present

## 2022-08-29 DIAGNOSIS — R2689 Other abnormalities of gait and mobility: Secondary | ICD-10-CM | POA: Diagnosis not present

## 2022-08-29 DIAGNOSIS — M158 Other polyosteoarthritis: Secondary | ICD-10-CM | POA: Diagnosis not present

## 2022-08-29 DIAGNOSIS — R0981 Nasal congestion: Secondary | ICD-10-CM

## 2022-08-29 DIAGNOSIS — R2681 Unsteadiness on feet: Secondary | ICD-10-CM | POA: Diagnosis not present

## 2022-08-31 DIAGNOSIS — M6281 Muscle weakness (generalized): Secondary | ICD-10-CM | POA: Diagnosis not present

## 2022-08-31 DIAGNOSIS — R2681 Unsteadiness on feet: Secondary | ICD-10-CM | POA: Diagnosis not present

## 2022-08-31 DIAGNOSIS — R2689 Other abnormalities of gait and mobility: Secondary | ICD-10-CM | POA: Diagnosis not present

## 2022-08-31 DIAGNOSIS — M158 Other polyosteoarthritis: Secondary | ICD-10-CM | POA: Diagnosis not present

## 2022-09-01 ENCOUNTER — Ambulatory Visit (INDEPENDENT_AMBULATORY_CARE_PROVIDER_SITE_OTHER): Payer: Medicare Other

## 2022-09-01 DIAGNOSIS — L209 Atopic dermatitis, unspecified: Secondary | ICD-10-CM | POA: Diagnosis not present

## 2022-09-01 MED ORDER — DUPILUMAB 300 MG/2ML ~~LOC~~ SOSY
300.0000 mg | PREFILLED_SYRINGE | Freq: Once | SUBCUTANEOUS | Status: AC
  Administered 2022-09-01: 300 mg via SUBCUTANEOUS

## 2022-09-01 NOTE — Progress Notes (Signed)
Patient here today for two week Dupixent injection for severe atopic dermatitis. Dupixent 300mg/2mL injected into right upper arm. Patient tolerated well.    [id number]:[id number] EXP: 07/2024 NDC:[id number]   Mattias Walmsley RMA   

## 2022-09-04 ENCOUNTER — Other Ambulatory Visit: Payer: Self-pay | Admitting: Family Medicine

## 2022-09-04 DIAGNOSIS — J449 Chronic obstructive pulmonary disease, unspecified: Secondary | ICD-10-CM

## 2022-09-05 DIAGNOSIS — R2689 Other abnormalities of gait and mobility: Secondary | ICD-10-CM | POA: Diagnosis not present

## 2022-09-05 DIAGNOSIS — M6281 Muscle weakness (generalized): Secondary | ICD-10-CM | POA: Diagnosis not present

## 2022-09-05 DIAGNOSIS — R2681 Unsteadiness on feet: Secondary | ICD-10-CM | POA: Diagnosis not present

## 2022-09-05 DIAGNOSIS — M158 Other polyosteoarthritis: Secondary | ICD-10-CM | POA: Diagnosis not present

## 2022-09-07 ENCOUNTER — Ambulatory Visit: Payer: Medicare Other | Admitting: Family Medicine

## 2022-09-07 DIAGNOSIS — R2689 Other abnormalities of gait and mobility: Secondary | ICD-10-CM | POA: Diagnosis not present

## 2022-09-07 DIAGNOSIS — R2681 Unsteadiness on feet: Secondary | ICD-10-CM | POA: Diagnosis not present

## 2022-09-07 DIAGNOSIS — M6281 Muscle weakness (generalized): Secondary | ICD-10-CM | POA: Diagnosis not present

## 2022-09-07 DIAGNOSIS — M158 Other polyosteoarthritis: Secondary | ICD-10-CM | POA: Diagnosis not present

## 2022-09-12 DIAGNOSIS — R2689 Other abnormalities of gait and mobility: Secondary | ICD-10-CM | POA: Diagnosis not present

## 2022-09-12 DIAGNOSIS — M6281 Muscle weakness (generalized): Secondary | ICD-10-CM | POA: Diagnosis not present

## 2022-09-12 DIAGNOSIS — R2681 Unsteadiness on feet: Secondary | ICD-10-CM | POA: Diagnosis not present

## 2022-09-12 DIAGNOSIS — M158 Other polyosteoarthritis: Secondary | ICD-10-CM | POA: Diagnosis not present

## 2022-09-13 ENCOUNTER — Ambulatory Visit: Payer: Medicare Other | Admitting: Family Medicine

## 2022-09-14 DIAGNOSIS — M158 Other polyosteoarthritis: Secondary | ICD-10-CM | POA: Diagnosis not present

## 2022-09-14 DIAGNOSIS — M6281 Muscle weakness (generalized): Secondary | ICD-10-CM | POA: Diagnosis not present

## 2022-09-14 DIAGNOSIS — R2689 Other abnormalities of gait and mobility: Secondary | ICD-10-CM | POA: Diagnosis not present

## 2022-09-14 DIAGNOSIS — R2681 Unsteadiness on feet: Secondary | ICD-10-CM | POA: Diagnosis not present

## 2022-09-15 ENCOUNTER — Ambulatory Visit (INDEPENDENT_AMBULATORY_CARE_PROVIDER_SITE_OTHER): Payer: Medicare Other

## 2022-09-15 DIAGNOSIS — L209 Atopic dermatitis, unspecified: Secondary | ICD-10-CM

## 2022-09-15 MED ORDER — DUPILUMAB 300 MG/2ML ~~LOC~~ SOSY: 300.0000 mg | PREFILLED_SYRINGE | Freq: Once | SUBCUTANEOUS | Status: DC

## 2022-09-15 NOTE — Progress Notes (Signed)
Patient here today for two week Dupixent injection for severe atopic dermatitis. Dupixent 366m/2mL injected into left upper arm. Patient tolerated well.    [id number]: 07/2024 [id number]  ADicie BeamRMA

## 2022-09-21 DIAGNOSIS — M158 Other polyosteoarthritis: Secondary | ICD-10-CM | POA: Diagnosis not present

## 2022-09-21 DIAGNOSIS — R2689 Other abnormalities of gait and mobility: Secondary | ICD-10-CM | POA: Diagnosis not present

## 2022-09-21 DIAGNOSIS — R2681 Unsteadiness on feet: Secondary | ICD-10-CM | POA: Diagnosis not present

## 2022-09-21 DIAGNOSIS — M6281 Muscle weakness (generalized): Secondary | ICD-10-CM | POA: Diagnosis not present

## 2022-09-23 DIAGNOSIS — R2681 Unsteadiness on feet: Secondary | ICD-10-CM | POA: Diagnosis not present

## 2022-09-23 DIAGNOSIS — M158 Other polyosteoarthritis: Secondary | ICD-10-CM | POA: Diagnosis not present

## 2022-09-23 DIAGNOSIS — R2689 Other abnormalities of gait and mobility: Secondary | ICD-10-CM | POA: Diagnosis not present

## 2022-09-23 DIAGNOSIS — M6281 Muscle weakness (generalized): Secondary | ICD-10-CM | POA: Diagnosis not present

## 2022-09-27 DIAGNOSIS — H40003 Preglaucoma, unspecified, bilateral: Secondary | ICD-10-CM | POA: Diagnosis not present

## 2022-09-27 LAB — HM DIABETES EYE EXAM

## 2022-09-29 ENCOUNTER — Ambulatory Visit (INDEPENDENT_AMBULATORY_CARE_PROVIDER_SITE_OTHER): Payer: Medicare Other

## 2022-09-29 DIAGNOSIS — L209 Atopic dermatitis, unspecified: Secondary | ICD-10-CM | POA: Diagnosis not present

## 2022-09-29 MED ORDER — DUPILUMAB 300 MG/2ML ~~LOC~~ SOSY
300.0000 mg | PREFILLED_SYRINGE | Freq: Once | SUBCUTANEOUS | Status: AC
  Administered 2022-09-29: 300 mg via SUBCUTANEOUS

## 2022-09-29 NOTE — Progress Notes (Signed)
Patient here today for two week Dupixent injection for severe atopic dermatitis. Dupixent '300mg'$ /22m injected into right upper arm. Patient tolerated well.    [id number]: 07/2024 [id number]  ADicie BeamRMA

## 2022-09-30 DIAGNOSIS — M6281 Muscle weakness (generalized): Secondary | ICD-10-CM | POA: Diagnosis not present

## 2022-09-30 DIAGNOSIS — R2681 Unsteadiness on feet: Secondary | ICD-10-CM | POA: Diagnosis not present

## 2022-09-30 DIAGNOSIS — M158 Other polyosteoarthritis: Secondary | ICD-10-CM | POA: Diagnosis not present

## 2022-09-30 DIAGNOSIS — R2689 Other abnormalities of gait and mobility: Secondary | ICD-10-CM | POA: Diagnosis not present

## 2022-10-03 DIAGNOSIS — R2689 Other abnormalities of gait and mobility: Secondary | ICD-10-CM | POA: Diagnosis not present

## 2022-10-03 DIAGNOSIS — M6281 Muscle weakness (generalized): Secondary | ICD-10-CM | POA: Diagnosis not present

## 2022-10-03 DIAGNOSIS — M158 Other polyosteoarthritis: Secondary | ICD-10-CM | POA: Diagnosis not present

## 2022-10-03 DIAGNOSIS — R2681 Unsteadiness on feet: Secondary | ICD-10-CM | POA: Diagnosis not present

## 2022-10-05 DIAGNOSIS — R2681 Unsteadiness on feet: Secondary | ICD-10-CM | POA: Diagnosis not present

## 2022-10-05 DIAGNOSIS — R2689 Other abnormalities of gait and mobility: Secondary | ICD-10-CM | POA: Diagnosis not present

## 2022-10-05 DIAGNOSIS — M6281 Muscle weakness (generalized): Secondary | ICD-10-CM | POA: Diagnosis not present

## 2022-10-05 DIAGNOSIS — M158 Other polyosteoarthritis: Secondary | ICD-10-CM | POA: Diagnosis not present

## 2022-10-06 ENCOUNTER — Ambulatory Visit: Payer: Medicare Other | Admitting: Dermatology

## 2022-10-11 DIAGNOSIS — Z961 Presence of intraocular lens: Secondary | ICD-10-CM | POA: Diagnosis not present

## 2022-10-11 DIAGNOSIS — R2681 Unsteadiness on feet: Secondary | ICD-10-CM | POA: Diagnosis not present

## 2022-10-11 DIAGNOSIS — R2689 Other abnormalities of gait and mobility: Secondary | ICD-10-CM | POA: Diagnosis not present

## 2022-10-11 DIAGNOSIS — M158 Other polyosteoarthritis: Secondary | ICD-10-CM | POA: Diagnosis not present

## 2022-10-11 DIAGNOSIS — H353211 Exudative age-related macular degeneration, right eye, with active choroidal neovascularization: Secondary | ICD-10-CM | POA: Diagnosis not present

## 2022-10-11 DIAGNOSIS — M6281 Muscle weakness (generalized): Secondary | ICD-10-CM | POA: Diagnosis not present

## 2022-10-11 DIAGNOSIS — H40003 Preglaucoma, unspecified, bilateral: Secondary | ICD-10-CM | POA: Diagnosis not present

## 2022-10-11 LAB — HM DIABETES EYE EXAM

## 2022-10-13 ENCOUNTER — Ambulatory Visit (INDEPENDENT_AMBULATORY_CARE_PROVIDER_SITE_OTHER): Payer: Medicare Other | Admitting: Dermatology

## 2022-10-13 ENCOUNTER — Encounter: Payer: Self-pay | Admitting: Dermatology

## 2022-10-13 VITALS — BP 131/77 | HR 101

## 2022-10-13 DIAGNOSIS — L821 Other seborrheic keratosis: Secondary | ICD-10-CM

## 2022-10-13 DIAGNOSIS — L578 Other skin changes due to chronic exposure to nonionizing radiation: Secondary | ICD-10-CM

## 2022-10-13 DIAGNOSIS — L820 Inflamed seborrheic keratosis: Secondary | ICD-10-CM | POA: Diagnosis not present

## 2022-10-13 DIAGNOSIS — Z1283 Encounter for screening for malignant neoplasm of skin: Secondary | ICD-10-CM | POA: Diagnosis not present

## 2022-10-13 DIAGNOSIS — D692 Other nonthrombocytopenic purpura: Secondary | ICD-10-CM | POA: Diagnosis not present

## 2022-10-13 DIAGNOSIS — L814 Other melanin hyperpigmentation: Secondary | ICD-10-CM

## 2022-10-13 DIAGNOSIS — L570 Actinic keratosis: Secondary | ICD-10-CM | POA: Diagnosis not present

## 2022-10-13 DIAGNOSIS — L2081 Atopic neurodermatitis: Secondary | ICD-10-CM | POA: Diagnosis not present

## 2022-10-13 DIAGNOSIS — L853 Xerosis cutis: Secondary | ICD-10-CM | POA: Diagnosis not present

## 2022-10-13 DIAGNOSIS — D229 Melanocytic nevi, unspecified: Secondary | ICD-10-CM | POA: Diagnosis not present

## 2022-10-13 DIAGNOSIS — Z79899 Other long term (current) drug therapy: Secondary | ICD-10-CM | POA: Diagnosis not present

## 2022-10-13 DIAGNOSIS — D1801 Hemangioma of skin and subcutaneous tissue: Secondary | ICD-10-CM

## 2022-10-13 MED ORDER — DUPILUMAB 300 MG/2ML ~~LOC~~ SOSY
300.0000 mg | PREFILLED_SYRINGE | Freq: Once | SUBCUTANEOUS | Status: AC
  Administered 2022-10-13: 300 mg via SUBCUTANEOUS

## 2022-10-13 NOTE — Progress Notes (Signed)
Follow-Up Visit   Subjective  Cody Bridges is a 89 y.o. male who presents for the following: Eczema (Trunk, extremities, Dupixent 300mg /85ml sq injections q 2wks) and Upper body skin exam (Hx of AKs). The patient has spots, moles and lesions to be evaluated, some may be new or changing and the patient has concerns that these could be cancer.  The following portions of the chart were reviewed this encounter and updated as appropriate:   Tobacco  Allergies  Meds  Problems  Med Hx  Surg Hx  Fam Hx     Review of Systems:  No other skin or systemic complaints except as noted in HPI or Assessment and Plan.  Objective  Well appearing patient in no apparent distress; mood and affect are within normal limits.  All skin waist up examined.  trunk, extremities xerosis  trunk, extremities Xerosis body  Nose x 1 Pink scaly macules  L neck x 2 (2) Stuck on waxy paps with erythema   Assessment & Plan   Lentigines - Scattered tan macules - Due to sun exposure - Benign-appearing, observe - Recommend daily broad spectrum sunscreen SPF 30+ to sun-exposed areas, reapply every 2 hours as needed. - Call for any changes  Seborrheic Keratoses - Stuck-on, waxy, tan-brown papules and/or plaques  - Benign-appearing - Discussed benign etiology and prognosis. - Observe - Call for any changes  Melanocytic Nevi - Tan-brown and/or pink-flesh-colored symmetric macules and papules - Benign appearing on exam today - Observation - Call clinic for new or changing moles - Recommend daily use of broad spectrum spf 30+ sunscreen to sun-exposed areas.   Hemangiomas - Red papules - Discussed benign nature - Observe - Call for any changes  Actinic Damage - Chronic condition, secondary to cumulative UV/sun exposure - diffuse scaly erythematous macules with underlying dyspigmentation - Recommend daily broad spectrum sunscreen SPF 30+ to sun-exposed areas, reapply every 2 hours as needed.   - Staying in the shade or wearing long sleeves, sun glasses (UVA+UVB protection) and wide brim hats (4-inch brim around the entire circumference of the hat) are also recommended for sun protection.  - Call for new or changing lesions.  Skin cancer screening performed today.   Purpura - Chronic; persistent and recurrent.  Treatable, but not curable. - Violaceous macules and patches - Benign - Related to trauma, age, sun damage and/or use of blood thinners, chronic use of topical and/or oral steroids - Observe - Can use OTC arnica containing moisturizer such as Dermend Bruise Formula if desired - Call for worsening or other concerns   Atopic neurodermatitis trunk, extremities Chronic and persistent condition with duration or expected duration over one year. Condition is bothersome/symptomatic for patient.   Atopic dermatitis - Severe, on Dupixent (biologic medication).  Atopic dermatitis (eczema) is a chronic, relapsing, pruritic condition that can significantly affect quality of life. It is often associated with allergic rhinitis and/or asthma and can require treatment with topical medications, phototherapy, or in severe cases a biologic medication called Dupixent, which requires long term medication management.    Cont Dupixent 300mg /33ml sq injections q 2 wks Dupixent 300mg /55ml sq injection today to L upper arm, Lot [id number] 12/2024 Cont Mometasone cr qd up to 5d/wk prn flares Cont Cerave cr qd  Dupilumab (Dupixent) is a treatment given by injection for adults and children with moderate-to-severe atopic dermatitis. Goal is control of skin condition, not cure. It is given as 2 injections at the first dose followed by 1 injection ever 2  weeks thereafter.  Young children are dosed monthly.  Potential side effects include allergic reaction, herpes infections, injection site reactions and conjunctivitis (inflammation of the eyes).  The use of Dupixent requires long term medication management,  including periodic office visits.   Topical steroids (such as triamcinolone, fluocinolone, fluocinonide, mometasone, clobetasol, halobetasol, betamethasone, hydrocortisone) can cause thinning and lightening of the skin if they are used for too long in the same area. Your physician has selected the right strength medicine for your problem and area affected on the body. Please use your medication only as directed by your physician to prevent side effects.    dupilumab (DUPIXENT) prefilled syringe 300 mg - trunk, extremities  Related Medications mometasone (ELOCON) 0.1 % cream APPLY TWICE DAILY TO AFFECTED AREAS UP TO 2 WEEKS AS NEEDED FOR RASH  Xerosis cutis trunk, extremities Cont Cerave cream qd  AK (actinic keratosis) Nose x 1 Destruction of lesion - Nose x 1 Complexity: simple   Destruction method: cryotherapy   Informed consent: discussed and consent obtained   Timeout:  patient name, date of birth, surgical site, and procedure verified Lesion destroyed using liquid nitrogen: Yes   Region frozen until ice ball extended beyond lesion: Yes   Outcome: patient tolerated procedure well with no complications   Post-procedure details: wound care instructions given    Inflamed seborrheic keratosis (2) L neck x 2 Symptomatic, irritating, patient would like treated. Destruction of lesion - L neck x 2 Complexity: simple   Destruction method: cryotherapy   Informed consent: discussed and consent obtained   Timeout:  patient name, date of birth, surgical site, and procedure verified Lesion destroyed using liquid nitrogen: Yes   Region frozen until ice ball extended beyond lesion: Yes   Outcome: patient tolerated procedure well with no complications   Post-procedure details: wound care instructions given    Return in about 2 weeks (around 10/27/2022) for nurse visit for injection, 32m with Dr. Nehemiah Massed, Atopic Derm.  I, Othelia Pulling, RMA, am acting as scribe for Sarina Ser, MD  . Documentation: I have reviewed the above documentation for accuracy and completeness, and I agree with the above.  Sarina Ser, MD

## 2022-10-13 NOTE — Patient Instructions (Addendum)
Cryotherapy Aftercare  Wash gently with soap and water everyday.   Apply Vaseline and Band-Aid daily until healed.     Due to recent changes in healthcare laws, you may see results of your pathology and/or laboratory studies on MyChart before the doctors have had a chance to review them. We understand that in some cases there may be results that are confusing or concerning to you. Please understand that not all results are received at the same time and often the doctors may need to interpret multiple results in order to provide you with the best plan of care or course of treatment. Therefore, we ask that you please give us 2 business days to thoroughly review all your results before contacting the office for clarification. Should we see a critical lab result, you will be contacted sooner.   If You Need Anything After Your Visit  If you have any questions or concerns for your doctor, please call our main line at [phone] and press option 4 to reach your doctor's medical assistant. If no one answers, please leave a voicemail as directed and we will return your call as soon as possible. Messages left after 4 pm will be answered the following business day.   You may also send us a message via MyChart. We typically respond to MyChart messages within 1-2 business days.  For prescription refills, please ask your pharmacy to contact our office. Our fax number is [phone].  If you have an urgent issue when the clinic is closed that cannot wait until the next business day, you can page your doctor at the number below.    Please note that while we do our best to be available for urgent issues outside of office hours, we are not available 24/7.   If you have an urgent issue and are unable to reach us, you may choose to seek medical care at your doctor's office, retail clinic, urgent care center, or emergency room.  If you have a medical emergency, please immediately call 911 or go to the  emergency department.  Pager Numbers  - Dr. Kowalski: [phone]  - Dr. Moye: [phone]  - Dr. Stewart: [phone]  In the event of inclement weather, please call our main line at [phone] for an update on the status of any delays or closures.  Dermatology Medication Tips: Please keep the boxes that topical medications come in in order to help keep track of the instructions about where and how to use these. Pharmacies typically print the medication instructions only on the boxes and not directly on the medication tubes.   If your medication is too expensive, please contact our office at [phone] option 4 or send us a message through MyChart.   We are unable to tell what your co-pay for medications will be in advance as this is different depending on your insurance coverage. However, we may be able to find a substitute medication at lower cost or fill out paperwork to get insurance to cover a needed medication.   If a prior authorization is required to get your medication covered by your insurance company, please allow us 1-2 business days to complete this process.  Drug prices often vary depending on where the prescription is filled and some pharmacies may offer cheaper prices.  The website www.goodrx.com contains coupons for medications through different pharmacies. The prices here do not account for what the cost may be with help from insurance (it may be cheaper with your insurance), but the website can   give you the price if you did not use any insurance.  - You can print the associated coupon and take it with your prescription to the pharmacy.  - You may also stop by our office during regular business hours and pick up a GoodRx coupon card.  - If you need your prescription sent electronically to a different pharmacy, notify our office through [facility] MyChart or by phone at [phone] option 4.     Si Usted Necesita Algo Despus de Su Visita  Tambin puede  enviarnos un mensaje a travs de MyChart. Por lo general respondemos a los mensajes de MyChart en el transcurso de 1 a 2 das hbiles.  Para renovar recetas, por favor pida a su farmacia que se ponga en contacto con nuestra oficina. Nuestro nmero de fax es el [phone].  Si tiene un asunto urgente cuando la clnica est cerrada y que no puede esperar hasta el siguiente da hbil, puede llamar/localizar a su doctor(a) al nmero que aparece a continuacin.   Por favor, tenga en cuenta que aunque hacemos todo lo posible para estar disponibles para asuntos urgentes fuera del horario de oficina, no estamos disponibles las 24 horas del da, los 7 das de la semana.   Si tiene un problema urgente y no puede comunicarse con nosotros, puede optar por buscar atencin mdica  en el consultorio de su doctor(a), en una clnica privada, en un centro de atencin urgente o en una sala de emergencias.  Si tiene una emergencia mdica, por favor llame inmediatamente al 911 o vaya a la sala de emergencias.  Nmeros de bper  - Dr. Kowalski: [phone]  - Dra. Moye: [phone]  - Dra. Stewart: [phone]  En caso de inclemencias del tiempo, por favor llame a nuestra lnea principal al [phone] para una actualizacin sobre el estado de cualquier retraso o cierre.  Consejos para la medicacin en dermatologa: Por favor, guarde las cajas en las que vienen los medicamentos de uso tpico para ayudarle a seguir las instrucciones sobre dnde y cmo usarlos. Las farmacias generalmente imprimen las instrucciones del medicamento slo en las cajas y no directamente en los tubos del medicamento.   Si su medicamento es muy caro, por favor, pngase en contacto con nuestra oficina llamando al [phone] y presione la opcin 4 o envenos un mensaje a travs de MyChart.   No podemos decirle cul ser su copago por los medicamentos por adelantado ya que esto es diferente dependiendo de la cobertura de su seguro.  Sin embargo, es posible que podamos encontrar un medicamento sustituto a menor costo o llenar un formulario para que el seguro cubra el medicamento que se considera necesario.   Si se requiere una autorizacin previa para que su compaa de seguros cubra su medicamento, por favor permtanos de 1 a 2 das hbiles para completar este proceso.  Los precios de los medicamentos varan con frecuencia dependiendo del lugar de dnde se surte la receta y alguna farmacias pueden ofrecer precios ms baratos.  El sitio web www.goodrx.com tiene cupones para medicamentos de diferentes farmacias. Los precios aqu no tienen en cuenta lo que podra costar con la ayuda del seguro (puede ser ms barato con su seguro), pero el sitio web puede darle el precio si no utiliz ningn seguro.  - Puede imprimir el cupn correspondiente y llevarlo con su receta a la farmacia.  - Tambin puede pasar por nuestra oficina durante el horario de atencin regular y recoger una tarjeta de cupones de GoodRx.  -   Si necesita que su receta se enve electrnicamente a una farmacia diferente, informe a nuestra oficina a travs de MyChart de [place] o por telfono llamando al [phone] y presione la opcin 4.  

## 2022-10-14 DIAGNOSIS — R2681 Unsteadiness on feet: Secondary | ICD-10-CM | POA: Diagnosis not present

## 2022-10-14 DIAGNOSIS — M6281 Muscle weakness (generalized): Secondary | ICD-10-CM | POA: Diagnosis not present

## 2022-10-14 DIAGNOSIS — R2689 Other abnormalities of gait and mobility: Secondary | ICD-10-CM | POA: Diagnosis not present

## 2022-10-14 DIAGNOSIS — M158 Other polyosteoarthritis: Secondary | ICD-10-CM | POA: Diagnosis not present

## 2022-10-15 ENCOUNTER — Encounter: Payer: Self-pay | Admitting: Dermatology

## 2022-10-17 ENCOUNTER — Other Ambulatory Visit: Payer: Self-pay | Admitting: Family Medicine

## 2022-10-17 DIAGNOSIS — J449 Chronic obstructive pulmonary disease, unspecified: Secondary | ICD-10-CM

## 2022-10-19 DIAGNOSIS — R2689 Other abnormalities of gait and mobility: Secondary | ICD-10-CM | POA: Diagnosis not present

## 2022-10-19 DIAGNOSIS — M158 Other polyosteoarthritis: Secondary | ICD-10-CM | POA: Diagnosis not present

## 2022-10-19 DIAGNOSIS — R2681 Unsteadiness on feet: Secondary | ICD-10-CM | POA: Diagnosis not present

## 2022-10-19 DIAGNOSIS — M6281 Muscle weakness (generalized): Secondary | ICD-10-CM | POA: Diagnosis not present

## 2022-10-20 DIAGNOSIS — M6281 Muscle weakness (generalized): Secondary | ICD-10-CM | POA: Diagnosis not present

## 2022-10-20 DIAGNOSIS — R2681 Unsteadiness on feet: Secondary | ICD-10-CM | POA: Diagnosis not present

## 2022-10-20 DIAGNOSIS — M158 Other polyosteoarthritis: Secondary | ICD-10-CM | POA: Diagnosis not present

## 2022-10-20 DIAGNOSIS — R2689 Other abnormalities of gait and mobility: Secondary | ICD-10-CM | POA: Diagnosis not present

## 2022-10-26 DIAGNOSIS — M158 Other polyosteoarthritis: Secondary | ICD-10-CM | POA: Diagnosis not present

## 2022-10-26 DIAGNOSIS — R2689 Other abnormalities of gait and mobility: Secondary | ICD-10-CM | POA: Diagnosis not present

## 2022-10-26 DIAGNOSIS — M6281 Muscle weakness (generalized): Secondary | ICD-10-CM | POA: Diagnosis not present

## 2022-10-26 DIAGNOSIS — R2681 Unsteadiness on feet: Secondary | ICD-10-CM | POA: Diagnosis not present

## 2022-10-27 ENCOUNTER — Ambulatory Visit (INDEPENDENT_AMBULATORY_CARE_PROVIDER_SITE_OTHER): Payer: Medicare Other

## 2022-10-27 ENCOUNTER — Other Ambulatory Visit: Payer: Self-pay

## 2022-10-27 DIAGNOSIS — L209 Atopic dermatitis, unspecified: Secondary | ICD-10-CM

## 2022-10-27 MED ORDER — DUPIXENT 300 MG/2ML ~~LOC~~ SOSY: PREFILLED_SYRINGE | SUBCUTANEOUS | 3 refills | Status: DC

## 2022-10-27 MED ORDER — DUPILUMAB 300 MG/2ML ~~LOC~~ SOSY
300.0000 mg | PREFILLED_SYRINGE | Freq: Once | SUBCUTANEOUS | Status: AC
  Administered 2022-10-27: 300 mg via SUBCUTANEOUS

## 2022-10-27 NOTE — Progress Notes (Signed)
Refill request faxed from [facility]. Escripted

## 2022-10-27 NOTE — Progress Notes (Signed)
Patient here today for two week Dupixent injection for severe atopic dermatitis. Dupixent 300mg /62mL injected into right upper arm. Patient tolerated well.    [id number] EXP: 01/28/2025 [id number]   Dicie Beam RMA

## 2022-11-02 ENCOUNTER — Emergency Department: Payer: Medicare Other

## 2022-11-02 ENCOUNTER — Inpatient Hospital Stay
Admission: EM | Admit: 2022-11-02 | Discharge: 2022-11-08 | DRG: 189 | Disposition: A | Discharge status: Skilled Nursing Facility | Payer: Medicare Other | Source: Skilled Nursing Facility | Attending: Internal Medicine | Admitting: Internal Medicine

## 2022-11-02 ENCOUNTER — Other Ambulatory Visit: Payer: Self-pay

## 2022-11-02 DIAGNOSIS — Z1152 Encounter for screening for COVID-19: Secondary | ICD-10-CM

## 2022-11-02 DIAGNOSIS — I110 Hypertensive heart disease with heart failure: Secondary | ICD-10-CM | POA: Diagnosis present

## 2022-11-02 DIAGNOSIS — E119 Type 2 diabetes mellitus without complications: Secondary | ICD-10-CM | POA: Diagnosis not present

## 2022-11-02 DIAGNOSIS — Z87891 Personal history of nicotine dependence: Secondary | ICD-10-CM

## 2022-11-02 DIAGNOSIS — I10 Essential (primary) hypertension: Secondary | ICD-10-CM | POA: Diagnosis not present

## 2022-11-02 DIAGNOSIS — Z7984 Long term (current) use of oral hypoglycemic drugs: Secondary | ICD-10-CM | POA: Diagnosis not present

## 2022-11-02 DIAGNOSIS — Z880 Allergy status to penicillin: Secondary | ICD-10-CM | POA: Diagnosis not present

## 2022-11-02 DIAGNOSIS — J441 Chronic obstructive pulmonary disease with (acute) exacerbation: Secondary | ICD-10-CM | POA: Diagnosis present

## 2022-11-02 DIAGNOSIS — J9601 Acute respiratory failure with hypoxia: Secondary | ICD-10-CM | POA: Diagnosis not present

## 2022-11-02 DIAGNOSIS — J45901 Unspecified asthma with (acute) exacerbation: Secondary | ICD-10-CM | POA: Diagnosis present

## 2022-11-02 DIAGNOSIS — I5032 Chronic diastolic (congestive) heart failure: Secondary | ICD-10-CM | POA: Diagnosis present

## 2022-11-02 DIAGNOSIS — E1151 Type 2 diabetes mellitus with diabetic peripheral angiopathy without gangrene: Secondary | ICD-10-CM | POA: Diagnosis not present

## 2022-11-02 DIAGNOSIS — Z800 Family history of malignant neoplasm of digestive organs: Secondary | ICD-10-CM | POA: Diagnosis not present

## 2022-11-02 DIAGNOSIS — K219 Gastro-esophageal reflux disease without esophagitis: Secondary | ICD-10-CM | POA: Diagnosis not present

## 2022-11-02 DIAGNOSIS — R059 Cough, unspecified: Secondary | ICD-10-CM | POA: Diagnosis not present

## 2022-11-02 DIAGNOSIS — Z7951 Long term (current) use of inhaled steroids: Secondary | ICD-10-CM | POA: Diagnosis not present

## 2022-11-02 DIAGNOSIS — Z515 Encounter for palliative care: Secondary | ICD-10-CM | POA: Diagnosis not present

## 2022-11-02 DIAGNOSIS — R0602 Shortness of breath: Secondary | ICD-10-CM | POA: Diagnosis not present

## 2022-11-02 DIAGNOSIS — Z79899 Other long term (current) drug therapy: Secondary | ICD-10-CM

## 2022-11-02 DIAGNOSIS — Z7189 Other specified counseling: Secondary | ICD-10-CM | POA: Diagnosis not present

## 2022-11-02 DIAGNOSIS — N400 Enlarged prostate without lower urinary tract symptoms: Secondary | ICD-10-CM | POA: Diagnosis present

## 2022-11-02 DIAGNOSIS — E785 Hyperlipidemia, unspecified: Secondary | ICD-10-CM | POA: Diagnosis not present

## 2022-11-02 DIAGNOSIS — Z66 Do not resuscitate: Secondary | ICD-10-CM | POA: Diagnosis present

## 2022-11-02 DIAGNOSIS — Z7401 Bed confinement status: Secondary | ICD-10-CM | POA: Diagnosis not present

## 2022-11-02 DIAGNOSIS — F419 Anxiety disorder, unspecified: Secondary | ICD-10-CM | POA: Diagnosis present

## 2022-11-02 DIAGNOSIS — R062 Wheezing: Secondary | ICD-10-CM | POA: Diagnosis not present

## 2022-11-02 DIAGNOSIS — E46 Unspecified protein-calorie malnutrition: Secondary | ICD-10-CM | POA: Diagnosis not present

## 2022-11-02 DIAGNOSIS — J439 Emphysema, unspecified: Secondary | ICD-10-CM | POA: Diagnosis present

## 2022-11-02 DIAGNOSIS — Z888 Allergy status to other drugs, medicaments and biological substances status: Secondary | ICD-10-CM | POA: Diagnosis not present

## 2022-11-02 DIAGNOSIS — J449 Chronic obstructive pulmonary disease, unspecified: Secondary | ICD-10-CM

## 2022-11-02 DIAGNOSIS — Z881 Allergy status to other antibiotic agents status: Secondary | ICD-10-CM

## 2022-11-02 DIAGNOSIS — R000 Tachycardia, unspecified: Secondary | ICD-10-CM | POA: Diagnosis not present

## 2022-11-02 DIAGNOSIS — J45909 Unspecified asthma, uncomplicated: Secondary | ICD-10-CM | POA: Diagnosis not present

## 2022-11-02 DIAGNOSIS — E1169 Type 2 diabetes mellitus with other specified complication: Secondary | ICD-10-CM | POA: Diagnosis not present

## 2022-11-02 DIAGNOSIS — I5033 Acute on chronic diastolic (congestive) heart failure: Secondary | ICD-10-CM | POA: Diagnosis not present

## 2022-11-02 DIAGNOSIS — R2681 Unsteadiness on feet: Secondary | ICD-10-CM | POA: Diagnosis not present

## 2022-11-02 DIAGNOSIS — N62 Hypertrophy of breast: Secondary | ICD-10-CM | POA: Diagnosis not present

## 2022-11-02 DIAGNOSIS — R0902 Hypoxemia: Secondary | ICD-10-CM | POA: Diagnosis not present

## 2022-11-02 DIAGNOSIS — Z8616 Personal history of COVID-19: Secondary | ICD-10-CM

## 2022-11-02 LAB — CBC
HCT: 36.3 % — ABNORMAL LOW (ref 39.0–52.0)
Hemoglobin: 11.6 g/dL — ABNORMAL LOW (ref 13.0–17.0)
MCH: 31.1 pg (ref 26.0–34.0)
MCHC: 32 g/dL (ref 30.0–36.0)
MCV: 97.3 fL (ref 80.0–100.0)
Platelets: 331 10*3/uL (ref 150–400)
RBC: 3.73 MIL/uL — ABNORMAL LOW (ref 4.22–5.81)
RDW: 12.8 % (ref 11.5–15.5)
WBC: 13 10*3/uL — ABNORMAL HIGH (ref 4.0–10.5)
nRBC: 0 % (ref 0.0–0.2)

## 2022-11-02 LAB — TROPONIN I (HIGH SENSITIVITY)
Troponin I (High Sensitivity): 15 ng/L (ref <18)
Troponin I (High Sensitivity): 11 ng/L (ref <18)

## 2022-11-02 LAB — LACTIC ACID, PLASMA: Lactic Acid, Venous: 1.6 mmol/L (ref 0.5–1.9)

## 2022-11-02 LAB — BASIC METABOLIC PANEL
Anion gap: 10 (ref 5–15)
BUN: 24 mg/dL — ABNORMAL HIGH (ref 8–23)
CO2: 28 mmol/L (ref 22–32)
Calcium: 9 mg/dL (ref 8.9–10.3)
Chloride: 97 mmol/L — ABNORMAL LOW (ref 98–111)
Creatinine, Ser: 1.1 mg/dL (ref 0.61–1.24)
GFR, Estimated: >60 mL/min (ref >60)
Glucose, Bld: 153 mg/dL — ABNORMAL HIGH (ref 70–99)
Potassium: 3.6 mmol/L (ref 3.5–5.1)
Sodium: 135 mmol/L (ref 135–145)

## 2022-11-02 LAB — RESP PANEL BY RT-PCR (RSV, FLU A&B, COVID)  RVPGX2
Influenza A by PCR: NEGATIVE
Influenza B by PCR: NEGATIVE
Resp Syncytial Virus by PCR: NEGATIVE
SARS Coronavirus 2 by RT PCR: NEGATIVE

## 2022-11-02 LAB — BLOOD GAS, VENOUS
Acid-Base Excess: 7.6 mmol/L — ABNORMAL HIGH (ref 0.0–2.0)
Bicarbonate: 33.7 mmol/L — ABNORMAL HIGH (ref 20.0–28.0)
O2 Content: 4 L/min
O2 Saturation: 35.9 %
Patient temperature: 37
pCO2, Ven: 52 mmHg (ref 44–60)
pH, Ven: 7.42 (ref 7.25–7.43)
pO2, Ven: <31 mmHg — CL (ref 32–45)

## 2022-11-02 LAB — CBG MONITORING, ED
Glucose-Capillary: 197 mg/dL — ABNORMAL HIGH (ref 70–99)
Glucose-Capillary: 162 mg/dL — ABNORMAL HIGH (ref 70–99)

## 2022-11-02 LAB — BRAIN NATRIURETIC PEPTIDE: B Natriuretic Peptide: 21.5 pg/mL (ref 0.0–100.0)

## 2022-11-02 LAB — PROCALCITONIN: Procalcitonin: <0.1 ng/mL

## 2022-11-02 MED ORDER — POLYVINYL ALCOHOL 1.4 % OP SOLN: 1.0000 [drp] | Freq: Three times a day (TID) | OPHTHALMIC | Status: DC | PRN

## 2022-11-02 MED ORDER — FINASTERIDE 5 MG PO TABS
5.0000 mg | ORAL_TABLET | Freq: Every day | ORAL | Status: DC
  Administered 2022-11-02 – 2022-11-08 (×7): 5 mg via ORAL
  Filled 2022-11-02 (×7): qty 1

## 2022-11-02 MED ORDER — IPRATROPIUM-ALBUTEROL 0.5-2.5 (3) MG/3ML IN SOLN
3.0000 mL | Freq: Once | RESPIRATORY_TRACT | Status: AC
  Administered 2022-11-02: 3 mL via RESPIRATORY_TRACT
  Filled 2022-11-02: qty 3

## 2022-11-02 MED ORDER — ENOXAPARIN SODIUM 60 MG/0.6ML IJ SOSY
0.5000 mg/kg | PREFILLED_SYRINGE | INTRAMUSCULAR | Status: DC
  Administered 2022-11-02 – 2022-11-03 (×2): 47.5 mg via SUBCUTANEOUS
  Filled 2022-11-02 (×2): qty 0.6

## 2022-11-02 MED ORDER — AZITHROMYCIN 250 MG PO TABS
250.0000 mg | ORAL_TABLET | Freq: Every day | ORAL | Status: AC
  Administered 2022-11-03 – 2022-11-06 (×4): 250 mg via ORAL
  Filled 2022-11-02 (×4): qty 1

## 2022-11-02 MED ORDER — ACETAMINOPHEN 325 MG PO TABS
650.0000 mg | ORAL_TABLET | Freq: Four times a day (QID) | ORAL | Status: DC | PRN
  Administered 2022-11-05: 650 mg via ORAL
  Filled 2022-11-02: qty 2

## 2022-11-02 MED ORDER — CALCIUM CARBONATE ANTACID 500 MG PO CHEW: 1.0000 | CHEWABLE_TABLET | Freq: Three times a day (TID) | ORAL | Status: DC | PRN

## 2022-11-02 MED ORDER — AZITHROMYCIN 250 MG PO TABS: 250.0000 mg | ORAL_TABLET | Freq: Every day | ORAL | Status: DC

## 2022-11-02 MED ORDER — METHYLPREDNISOLONE SODIUM SUCC 125 MG IJ SOLR
125.0000 mg | Freq: Once | INTRAMUSCULAR | Status: AC
  Administered 2022-11-02: 125 mg via INTRAVENOUS
  Filled 2022-11-02: qty 2

## 2022-11-02 MED ORDER — VITAMIN D 25 MCG (1000 UNIT) PO TABS
5000.0000 [IU] | ORAL_TABLET | Freq: Every day | ORAL | Status: DC
  Administered 2022-11-02 – 2022-11-08 (×7): 5000 [IU] via ORAL
  Filled 2022-11-02 (×7): qty 5

## 2022-11-02 MED ORDER — INSULIN ASPART 100 UNIT/ML IJ SOLN
0.0000 [IU] | Freq: Three times a day (TID) | INTRAMUSCULAR | Status: DC
  Administered 2022-11-02 – 2022-11-03 (×3): 2 [IU] via SUBCUTANEOUS
  Administered 2022-11-03 – 2022-11-04 (×3): 1 [IU] via SUBCUTANEOUS
  Administered 2022-11-04: 3 [IU] via SUBCUTANEOUS
  Administered 2022-11-05: 2 [IU] via SUBCUTANEOUS
  Administered 2022-11-05: 3 [IU] via SUBCUTANEOUS
  Administered 2022-11-05: 2 [IU] via SUBCUTANEOUS
  Administered 2022-11-06: 5 [IU] via SUBCUTANEOUS
  Administered 2022-11-06: 2 [IU] via SUBCUTANEOUS
  Administered 2022-11-07: 1 [IU] via SUBCUTANEOUS
  Administered 2022-11-07: 5 [IU] via SUBCUTANEOUS
  Administered 2022-11-07: 3 [IU] via SUBCUTANEOUS
  Administered 2022-11-08: 2 [IU] via SUBCUTANEOUS
  Administered 2022-11-08: 1 [IU] via SUBCUTANEOUS
  Filled 2022-11-02 (×17): qty 1

## 2022-11-02 MED ORDER — INSULIN ASPART 100 UNIT/ML IJ SOLN
0.0000 [IU] | Freq: Every day | INTRAMUSCULAR | Status: DC
  Administered 2022-11-02: 0 [IU] via SUBCUTANEOUS
  Administered 2022-11-06 – 2022-11-07 (×2): 2 [IU] via SUBCUTANEOUS
  Filled 2022-11-02 (×2): qty 1

## 2022-11-02 MED ORDER — IPRATROPIUM-ALBUTEROL 0.5-2.5 (3) MG/3ML IN SOLN
3.0000 mL | RESPIRATORY_TRACT | Status: DC
  Administered 2022-11-02 – 2022-11-03 (×8): 3 mL via RESPIRATORY_TRACT
  Filled 2022-11-02 (×8): qty 3

## 2022-11-02 MED ORDER — AMLODIPINE BESYLATE 5 MG PO TABS
5.0000 mg | ORAL_TABLET | Freq: Every day | ORAL | Status: DC
  Administered 2022-11-02 – 2022-11-08 (×7): 5 mg via ORAL
  Filled 2022-11-02 (×7): qty 1

## 2022-11-02 MED ORDER — AZITHROMYCIN 500 MG PO TABS: 500.0000 mg | ORAL_TABLET | Freq: Every day | ORAL | Status: DC

## 2022-11-02 MED ORDER — METHYLPREDNISOLONE SODIUM SUCC 40 MG IJ SOLR
40.0000 mg | Freq: Two times a day (BID) | INTRAMUSCULAR | Status: DC
  Administered 2022-11-02 – 2022-11-05 (×6): 40 mg via INTRAVENOUS
  Filled 2022-11-02 (×7): qty 1

## 2022-11-02 MED ORDER — SODIUM CHLORIDE 0.9 % IV SOLN
500.0000 mg | Freq: Once | INTRAVENOUS | Status: AC
  Administered 2022-11-02: 500 mg via INTRAVENOUS
  Filled 2022-11-02: qty 5

## 2022-11-02 MED ORDER — ALBUTEROL SULFATE (2.5 MG/3ML) 0.083% IN NEBU
2.5000 mg | INHALATION_SOLUTION | RESPIRATORY_TRACT | Status: DC | PRN
  Administered 2022-11-03 – 2022-11-04 (×2): 2.5 mg via RESPIRATORY_TRACT
  Filled 2022-11-02 (×3): qty 3

## 2022-11-02 MED ORDER — TAMSULOSIN HCL 0.4 MG PO CAPS
0.4000 mg | ORAL_CAPSULE | Freq: Every day | ORAL | Status: DC
  Administered 2022-11-02 – 2022-11-08 (×7): 0.4 mg via ORAL
  Filled 2022-11-02 (×7): qty 1

## 2022-11-02 MED ORDER — DM-GUAIFENESIN ER 30-600 MG PO TB12
1.0000 | ORAL_TABLET | Freq: Two times a day (BID) | ORAL | Status: DC | PRN
  Administered 2022-11-03: 1 via ORAL
  Filled 2022-11-02: qty 1

## 2022-11-02 MED ORDER — HYDROCHLOROTHIAZIDE 25 MG PO TABS
25.0000 mg | ORAL_TABLET | Freq: Every day | ORAL | Status: DC
  Administered 2022-11-02 – 2022-11-03 (×2): 25 mg via ORAL
  Filled 2022-11-02 (×2): qty 1

## 2022-11-02 MED ORDER — ONDANSETRON HCL 4 MG/2ML IJ SOLN: 4.0000 mg | Freq: Three times a day (TID) | INTRAMUSCULAR | Status: DC | PRN

## 2022-11-02 MED ORDER — HYDRALAZINE HCL 20 MG/ML IJ SOLN: 5.0000 mg | INTRAMUSCULAR | Status: DC | PRN

## 2022-11-02 MED ORDER — ROSUVASTATIN CALCIUM 10 MG PO TABS
20.0000 mg | ORAL_TABLET | Freq: Every day | ORAL | Status: DC
  Administered 2022-11-02 – 2022-11-08 (×7): 20 mg via ORAL
  Filled 2022-11-02: qty 1
  Filled 2022-11-02 (×4): qty 2
  Filled 2022-11-02: qty 1
  Filled 2022-11-02: qty 2

## 2022-11-02 MED ORDER — AZELASTINE HCL 0.1 % NA SOLN
1.0000 | Freq: Every day | NASAL | Status: DC
  Administered 2022-11-04 – 2022-11-07 (×4): 1 via NASAL
  Filled 2022-11-02 (×2): qty 30

## 2022-11-02 MED ORDER — ADULT MULTIVITAMIN W/MINERALS CH
1.0000 | ORAL_TABLET | Freq: Every day | ORAL | Status: DC
  Administered 2022-11-02 – 2022-11-08 (×7): 1 via ORAL
  Filled 2022-11-02 (×7): qty 1

## 2022-11-02 NOTE — H&P (Addendum)
History and Physical    Cody Bridges DOB: 1933-02-09 DOA: 11/02/2022  Referring MD/NP/PA:   PCP: Leone Haven, MD   Patient coming from:  The patient is coming from home.    Chief Complaint: SOB  HPI: Cody Bridges is a 89 y.o. male with medical history significant of COPD/asthma, hypertension, hyperlipidemia, diabetes mellitus, diastolic CHF, GERD, BPH, hard of hearing, who presents with SOB.  Patient that he has been having SOB for more than 4 days, which has been progressively worsening.  Patient has productive cough with yellow-colored sputum production.  No fever or chills.  Denies active chest pain.  Patient states that he does not have abdominal pain at rest, but coughing induces some abdominal discomfort.  No nausea, vomiting or diarrhea.  No symptoms of UTI.  Patient is normally not using oxygen, but was found to have oxygen saturation 89% on room air. Initially started on 4 L oxygen, but pt still has severe respiratory distress, cannot speak in full sentence, using accessory muscle for breathing.  BiPAP is started in ED.   Data reviewed independently and ED Course: pt was found to have WBC 13.0, GFR> 60, trop 15 --> 11, negative PCR for flu, RSV and COVID.  Temperature 99.1, blood pressure 125/75, heart rate 112, RR 22. VBG with pH 7.42, CO2 52, O2 <31.  Chest x-ray negative.  Patient is admitted to PCU as inpatient.  EKG: I have personally reviewed.  Sinus rhythm, QTc 482, old right bundle blockade, frequent PVC.  Review of Systems:   General: no fevers, chills, no body weight gain, has fatigue HEENT: no blurry vision, or sore throat Respiratory: has dyspnea, coughing, wheezing CV: no chest pain, no palpitations GI: no nausea, vomiting, abdominal pain, diarrhea, constipation GU: no dysuria, burning on urination, increased urinary frequency, hematuria  Ext: has trace leg edema Neuro: no unilateral weakness, numbness, or tingling, no vision change or  hearing loss Skin: no rash, no skin tear. MSK: No muscle spasm, no deformity, no limitation of range of movement in spin Heme: No easy bruising.  Travel history: No recent long distant travel.   Allergy:  Allergies  Allergen Reactions   Clindamycin Hives   Lisinopril Cough   Penicillin V Potassium Rash and Other (See Comments)    Has patient had a PCN reaction causing immediate rash, facial/tongue/throat swelling, SOB or lightheadedness with hypotension: Unknown Has patient had a PCN reaction causing severe rash involving mucus membranes or skin necrosis: Unknown Has patient had a PCN reaction that required hospitalization: No Has patient had a PCN reaction occurring within the last 10 years: No If all of the above answers are "NO", then may proceed with Cephalosporin use.     Past Medical History:  Diagnosis Date   Actinic keratosis    Arthritis    Asthma    COPD (chronic obstructive pulmonary disease)    COVID-19 02/13/2021   Diabetes mellitus without complication     123456   Dyspnea    with exertion   Edema    GERD (gastroesophageal reflux disease)    HOH (hard of hearing)    AIDS   Hyperlipidemia    Hypertension    Wheezing     Past Surgical History:  Procedure Laterality Date   BUNIONECTOMY Right    CATARACT EXTRACTION W/PHACO Left 08/22/2017   Procedure: CATARACT EXTRACTION PHACO AND INTRAOCULAR LENS PLACEMENT ([place]);  Surgeon: Birder Robson, MD;  Location: [facility] ORS;  Service: Ophthalmology;  Laterality: Left;  [place] 00:43.7 AP% 12.3 CDE 5.38 Fluid pack lot # [id number]   COLONOSCOPY  2016   ELECTROMAGNETIC NAVIGATION BROCHOSCOPY N/A 02/09/2017   Procedure: ELECTROMAGNETIC NAVIGATION BRONCHOSCOPY;  Surgeon: Flora Lipps, MD;  Location: [facility] ORS;  Service: Cardiopulmonary;  Laterality: N/A;   EYE SURGERY Right    Cataract Extraction with IOL   HAMMER TOE SURGERY Right    TONSILLECTOMY  1938    Social History:  reports that he quit smoking about 37 years ago.  His smoking use included cigarettes. He has a 70.00 pack-year smoking history. He has never used smokeless tobacco. He reports current alcohol use. He reports that he does not use drugs.  Family History:  Family History  Problem Relation Age of Onset   Cancer Mother    Stomach cancer Mother    Stomach cancer Sister      Prior to Admission medications   Medication Sig Start Date End Date Taking? Authorizing Provider  Accu-Chek FastClix Lancets MISC Use up to Once daily as Directed  DX:  E11.9 12/01/21   Leone Haven, MD  ACCU-CHEK GUIDE test strip USE UP TO ONCE DAILY AS DIRECTED DX: E11.9 12/31/21   Leone Haven, MD  acetaminophen (TYLENOL) 500 MG tablet Take 1,000 mg by mouth every 6 (six) hours as needed for mild pain or moderate pain.    [provider]  albuterol (VENTOLIN HFA) 108 (90 Base) MCG/ACT inhaler INHALE 1-2 PUFFS BY MOUTH EVERY 6 HOURS AS NEEDED FOR WHEEZE OR SHORTNESS OF BREATH 10/17/22   Leone Haven, MD  amLODipine (NORVASC) 5 MG tablet Take 5 mg by mouth daily. 05/14/20   [provider]  Azelastine HCl 137 MCG/SPRAY SOLN PLACE 2 SPRAYS INTO BOTH NOSTRILS 2 (TWO) TIMES DAILY. USE IN EACH NOSTRIL AS DIRECTED 08/02/22   Leone Haven, MD  blood glucose meter kit and supplies KIT Dispense based on patient and insurance preference. Use once daily as directed. (FOR ICD-10  E11.9). 05/31/21   Leone Haven, MD  calcium carbonate (TUMS - DOSED IN MG ELEMENTAL CALCIUM) 500 MG chewable tablet Chew 1-2 tablets by mouth 3 (three) times daily as needed (for acid reflux/indigestion.).    [provider]  carboxymethylcellulose (REFRESH PLUS) 0.5 % SOLN Apply 1 drop to eye 3 (three) times daily as needed (dry eyes).    [provider]  Cholecalciferol (VITAMIN D-3) 5000 units TABS Take 5,000 Units by mouth daily.     [provider]  DUPIXENT 300 MG/2ML prefilled syringe INJECT 1 SYRINGE UNDER THE SKIN EVERY OTHER WEEK  10/27/22   Ralene Bathe, MD  Emollient (CERAVE DAILY MOISTURIZING) LOTN Apply 1 application topically daily as needed (dry skin).    [provider]  finasteride (PROSCAR) 5 MG tablet Take 1 tablet (5 mg total) by mouth daily. 06/27/22   Vaillancourt, Aldona Bar, PA-C  fluticasone (FLONASE) 50 MCG/ACT nasal spray Place 2 sprays into both nostrils daily. 02/16/21   Dahal, Marlowe Aschoff, MD  Fluticasone-Umeclidin-Vilant (TRELEGY ELLIPTA) 100-62.5-25 MCG/ACT AEPB Inhale 1 puff into the lungs daily. 09/10/21   Leone Haven, MD  hydrochlorothiazide (HYDRODIURIL) 25 MG tablet TAKE 1 TABLET BY MOUTH EVERY DAY 08/22/22   Leone Haven, MD  metFORMIN (GLUCOPHAGE) 500 MG tablet TAKE 1 TABLET BY MOUTH TWICE A DAY 08/29/22   Leone Haven, MD  mometasone (ELOCON) 0.1 % cream APPLY TWICE DAILY TO AFFECTED AREAS UP TO 2 WEEKS AS NEEDED FOR RASH 11/15/21   Ralene Bathe, MD  Multiple Vitamin (MULTIVITAMIN WITH MINERALS) TABS tablet Take 1 tablet by mouth daily.    [provider]  Multiple Vitamins-Minerals (PRESERVISION AREDS PO) Take 1 capsule by mouth in the morning and at bedtime.    [provider]  predniSONE (DELTASONE) 20 MG tablet Take 2 tablets (40 mg total) by mouth daily with breakfast. 06/13/22   Leone Haven, MD  rosuvastatin (CRESTOR) 20 MG tablet TAKE 1 TABLET BY MOUTH EVERY DAY 07/14/22   Leone Haven, MD  tamsulosin (FLOMAX) 0.4 MG CAPS capsule Take 1 capsule (0.4 mg total) by mouth daily. 06/27/22   Vaillancourt, Samantha, PA-C  Vibegron (GEMTESA) 75 MG TABS Take 75 mg by mouth daily. 06/27/22   Debroah Loop, PA-C    Physical Exam: Vitals:   11/02/22 1224 11/02/22 1225 11/02/22 1226 11/02/22 1227  BP:      Pulse: 95 95 96 97  Resp:      Temp:      TempSrc:      SpO2: 100% 100% 100% 100%   General: Has acute respiratory distress HEENT:       Eyes: PERRL, EOMI, no scleral icterus.       ENT: No discharge from the ears and nose,  no pharynx injection, no tonsillar enlargement.        Neck: No JVD, no bruit, no mass felt. Heme: No neck lymph node enlargement. Cardiac: S1/S2, RRR, No murmurs, No gallops or rubs. Respiratory: Has wheezing bilaterally, has decreased air movement bilaterally. GI: Soft, nondistended, nontender, no rebound pain, no organomegaly, BS present. GU: No hematuria Ext: has trace leg edema bilaterally. 1+DP/PT pulse bilaterally. Musculoskeletal: No joint deformities, No joint redness or warmth, no limitation of ROM in spin. Skin: No rashes.  Neuro: Alert, oriented X3, cranial nerves II-XII grossly intact, moves all extremities normally.  Psych: Patient is not psychotic, no suicidal or hemocidal ideation.  Labs on Admission: I have personally reviewed following labs and imaging studies  CBC: Recent Labs  Lab 11/02/22 0925  WBC 13.0*  HGB 11.6*  HCT 36.3*  MCV 97.3  PLT [id number]   Basic Metabolic Panel: Recent Labs  Lab 11/02/22 0925  NA 135  K 3.6  CL 97*  CO2 28  GLUCOSE 153*  BUN 24*  CREATININE 1.10  CALCIUM 9.0   GFR: CrCl cannot be calculated (Unknown ideal weight.). Liver Function Tests: No results for input(s): "AST", "ALT", "ALKPHOS", "BILITOT", "PROT", "ALBUMIN" in the last 168 hours. No results for input(s): "LIPASE", "AMYLASE" in the last 168 hours. No results for input(s): "AMMONIA" in the last 168 hours. Coagulation Profile: No results for input(s): "INR", "PROTIME" in the last 168 hours. Cardiac Enzymes: No results for input(s): "CKTOTAL", "CKMB", "CKMBINDEX", "TROPONINI" in the last 168 hours. BNP (last 3 results) Recent Labs    06/13/22 1104  PROBNP 12.0   HbA1C: No results for input(s): "HGBA1C" in the last 72 hours. CBG: No results for input(s): "GLUCAP" in the last 168 hours. Lipid Profile: No results for input(s): "CHOL", "HDL", "LDLCALC", "TRIG", "CHOLHDL", "LDLDIRECT" in the last 72 hours. Thyroid Function Tests: No results for input(s): "TSH",  "T4TOTAL", "FREET4", "T3FREE", "THYROIDAB" in the last 72 hours. Anemia Panel: No results for input(s): "VITAMINB12", "FOLATE", "FERRITIN", "TIBC", "IRON", "RETICCTPCT" in the last 72 hours. Urine analysis:    Component Value Date/Time   COLORURINE YELLOW 08/12/2020 1605   APPEARANCEUR Clear 06/27/2022 1059   LABSPEC 1.015 08/12/2020 1605   PHURINE 6.0 08/12/2020 1605   GLUCOSEU Negative 06/27/2022 1059  GLUCOSEU NEGATIVE 08/12/2020 1605   HGBUR NEGATIVE 08/12/2020 1605   BILIRUBINUR Negative 06/27/2022 1059   KETONESUR NEGATIVE 08/12/2020 1605   PROTEINUR Negative 06/27/2022 1059   UROBILINOGEN 0.2 11/10/2020 1208   UROBILINOGEN 0.2 08/12/2020 1605   NITRITE Negative 06/27/2022 1059   NITRITE NEGATIVE 08/12/2020 1605   LEUKOCYTESUR Trace (A) 06/27/2022 1059   LEUKOCYTESUR NEGATIVE 08/12/2020 1605   Sepsis Labs: @LABRCNTIP (procalcitonin:4,lacticidven:4) ) Recent Results (from the past 240 hour(s))  Resp panel by RT-PCR (RSV, Flu A&B, Covid) Anterior Nasal Swab     Status: None   Collection Time: 11/02/22 11:56 AM   Specimen: Anterior Nasal Swab  Result Value Ref Range Status   SARS Coronavirus 2 by RT PCR NEGATIVE NEGATIVE Final    Comment: (NOTE) SARS-CoV-2 target nucleic acids are NOT DETECTED.  The SARS-CoV-2 RNA is generally detectable in upper respiratory specimens during the acute phase of infection. The lowest concentration of SARS-CoV-2 viral copies this assay can detect is 138 copies/mL. A negative result does not preclude SARS-Cov-2 infection and should not be used as the sole basis for treatment or other patient management decisions. A negative result may occur with  improper specimen collection/handling, submission of specimen other than nasopharyngeal swab, presence of viral mutation(s) within the areas targeted by this assay, and inadequate number of viral copies(<138 copies/mL). A negative result must be combined with clinical observations, patient  history, and epidemiological information. The expected result is Negative.  Fact Sheet for Patients:  EntrepreneurPulse.com.au  Fact Sheet for Healthcare Providers:  IncredibleEmployment.be  This test is no t yet approved or cleared by the [place] FDA and  has been authorized for detection and/or diagnosis of SARS-CoV-2 by FDA under an Emergency Use Authorization (EUA). This EUA will remain  in effect (meaning this test can be used) for the duration of the COVID-19 declaration under Section 564(b)(1) of the Act, 21 U.S.C.section 360bbb-3(b)(1), unless the authorization is terminated  or revoked sooner.       Influenza A by PCR NEGATIVE NEGATIVE Final   Influenza B by PCR NEGATIVE NEGATIVE Final    Comment: (NOTE) The Xpert Xpress SARS-CoV-2/FLU/RSV plus assay is intended as an aid in the diagnosis of influenza from Nasopharyngeal swab specimens and should not be used as a sole basis for treatment. Nasal washings and aspirates are unacceptable for Xpert Xpress SARS-CoV-2/FLU/RSV testing.  Fact Sheet for Patients: EntrepreneurPulse.com.au  Fact Sheet for Healthcare Providers: IncredibleEmployment.be  This test is not yet approved or cleared by the [place] FDA and has been authorized for detection and/or diagnosis of SARS-CoV-2 by FDA under an Emergency Use Authorization (EUA). This EUA will remain in effect (meaning this test can be used) for the duration of the COVID-19 declaration under Section 564(b)(1) of the Act, 21 U.S.C. section 360bbb-3(b)(1), unless the authorization is terminated or revoked.     Resp Syncytial Virus by PCR NEGATIVE NEGATIVE Final    Comment: (NOTE) Fact Sheet for Patients: EntrepreneurPulse.com.au  Fact Sheet for Healthcare Providers: IncredibleEmployment.be  This test is not yet approved or cleared by the [place] FDA  and has been authorized for detection and/or diagnosis of SARS-CoV-2 by FDA under an Emergency Use Authorization (EUA). This EUA will remain in effect (meaning this test can be used) for the duration of the COVID-19 declaration under Section 564(b)(1) of the Act, 21 U.S.C. section 360bbb-3(b)(1), unless the authorization is terminated or revoked.  Performed at [facility], [address], [place], [place] [address]  Radiological Exams on Admission: DG Chest 2 View  Result Date: 11/02/2022 CLINICAL DATA:  Shortness of breath EXAM: CHEST - 2 VIEW COMPARISON:  02/13/2021 FINDINGS: Mild cardiomegaly and aortic tortuosity. There is no edema, consolidation, effusion, or pneumothorax. IMPRESSION: No evidence of active disease. Electronically Signed   By: Jorje Guild M.D.   On: 11/02/2022 09:40      Assessment/Plan Principal Problem:   COPD exacerbation Active Problems:   Asthma exacerbation   Acute respiratory failure with hypoxia   Hypertension   Chronic diastolic CHF (congestive heart failure)   HLD (hyperlipidemia)   Diabetes mellitus without complication   BPH (benign prostatic hyperplasia)   Assessment and Plan:   Acute respiratory failure with hypoxia due to COPD/asthma exacerbation: Patient has severe respiratory distress, requiring BiPAP.  Chest x-ray negative. Pt is using anti-IL 4 receptor antibody, Dupilumab at home.  - will admit to PCU as inpatient -BiPAP --> try to wean off -Bronchodilators -Solu-Medrol 40 mg IV bid -Azithromycin (500 mg by IV, then 250 mg orally) -Mucinex for cough  -Incentive spirometry -sputum culture -Nasal cannula oxygen as needed to maintain O2 saturation 93% or greater when pt is off BiPAP  Hypertension -IV hydralazine as needed -Amlodipine, HCTZ  Chronic diastolic CHF (congestive heart failure): 2D echo on 10/18/2021 showed EF of 55% with grade 1 diastolic dysfunction.  Patient has trace leg edema, no JVD.  No  pulmonary edema by chest x-ray.  CHF seem to be compensated. -Check BNP -Patient is on HCTZ  HLD (hyperlipidemia) -Crestor  Diabetes mellitus without complication: Recent 123456 6.9, well-controlled.  Patient taking metformin -SSI  BPH (benign prostatic hyperplasia) -Flomax, Proscar     DVT ppx:  SQ Lovenox  Code Status: Full code per pt  Family Communication:  Yes, patient's lady friend   at bed side.    Disposition Plan:  Anticipate discharge back to previous environment  Consults called:  none  Admission status and Level of care: Progressive:  as inpt     Dispo: The patient is from: Home              Anticipated d/c is to: Home              Anticipated d/c date is: 2 days              Patient currently is not medically stable to d/c.    Severity of Illness:  The appropriate patient status for this patient is INPATIENT. Inpatient status is judged to be reasonable and necessary in order to provide the required intensity of service to ensure the patient's safety. The patient's presenting symptoms, physical exam findings, and initial radiographic and laboratory data in the context of their chronic comorbidities is felt to place them at high risk for further clinical deterioration. Furthermore, it is not anticipated that the patient will be medically stable for discharge from the hospital within 2 midnights of admission.   * I certify that at the point of admission it is my clinical judgment that the patient will require inpatient hospital care spanning beyond 2 midnights from the point of admission due to high intensity of service, high risk for further deterioration and high frequency of surveillance required.*       Date of Service 11/02/2022    Ivor Costa [facility]   If 7PM-7AM, please contact night-coverage [email or web address] 11/02/2022, 1:26 PM

## 2022-11-02 NOTE — ED Provider Notes (Signed)
[facility] Provider Note    Event Date/Time   First MD Initiated Contact with Patient 11/02/22 1135     (approximate)   History   Shortness of Breath   HPI  Willys Wadding Quintero is a 89 y.o. male history of COPD diabetes presents to the ER for worsening shortness of breath.  Also has a history of diastolic heart failure does not wear home oxygen.  Has having productive cough with yellow sputum.  No fevers denies any nausea or vomiting.  No recent antibiotics no recent steroids.     Physical Exam   Triage Vital Signs: ED Triage Vitals [11/02/22 0923]  Enc Vitals Group     BP 125/75     Pulse Rate (!) 112     Resp (!) 22     Temp 99.1 F (37.3 C)     Temp Source Oral     SpO2 93 %     Weight      Height      Head Circumference      Peak Flow      Pain Score 0     Pain Loc      Pain Edu?      Excl. in [place]?     Most recent vital signs: Vitals:   11/02/22 1226 11/02/22 1227  BP:    Pulse: 96 97  Resp:    Temp:    SpO2: 100% 100%     Constitutional: Alert  Eyes: Conjunctivae are normal.  Head: Atraumatic. Nose: No congestion/rhinnorhea. Mouth/Throat: Mucous membranes are moist.   Neck: Painless ROM.  Cardiovascular:   Good peripheral circulation. Respiratory: Significant tachypnea with acute respiratory failure with hypoxia requiring some pulmonal oxygen to maintain sats in the low 90s. Gastrointestinal: Soft and nontender.  Musculoskeletal:  no deformity Neurologic:  MAE spontaneously. No gross focal neurologic deficits are appreciated.  Skin:  Skin is warm, dry and intact. No rash noted. Psychiatric: Mood and affect are normal. Speech and behavior are normal.    ED Results / Procedures / Treatments   Labs (all labs ordered are listed, but only abnormal results are displayed) Labs Reviewed  BASIC METABOLIC PANEL - Abnormal; Notable for the following components:      Result Value   Chloride 97 (*)    Glucose, Bld 153 (*)     BUN 24 (*)    All other components within normal limits  CBC - Abnormal; Notable for the following components:   WBC 13.0 (*)    RBC 3.73 (*)    Hemoglobin 11.6 (*)    HCT 36.3 (*)    All other components within normal limits  BLOOD GAS, VENOUS - Abnormal; Notable for the following components:   pO2, Ven <31 (*)    Bicarbonate 33.7 (*)    Acid-Base Excess 7.6 (*)    All other components within normal limits  RESP PANEL BY RT-PCR (RSV, FLU A&B, COVID)  [id number]  EXPECTORATED SPUTUM ASSESSMENT W GRAM STAIN, RFLX TO RESP C  LACTIC ACID, PLASMA  LACTIC ACID, PLASMA  PROCALCITONIN  BRAIN NATRIURETIC PEPTIDE  TROPONIN I (HIGH SENSITIVITY)  TROPONIN I (HIGH SENSITIVITY)     EKG  ED ECG REPORT I, Merlyn Lot, the attending physician, personally viewed and interpreted this ECG.   Date: 11/02/2022  EKG Time: 9:27  Rate: 110  Rhythm: sinus  Axis: normal  Intervals: normal qt  ST&T Change: no stemi, no depressions    RADIOLOGY Please see ED  Course for my review and interpretation.  I personally reviewed all radiographic images ordered to evaluate for the above acute complaints and reviewed radiology reports and findings.  These findings were personally discussed with the patient.  Please see medical record for radiology report.    PROCEDURES:  Critical Care performed: Yes, see critical care procedure note(s)  .Critical Care  Performed by: Merlyn Lot, MD Authorized by: Merlyn Lot, MD   Critical care provider statement:    Critical care time (minutes):  40   Critical care was necessary to treat or prevent imminent or life-threatening deterioration of the following conditions:  Respiratory failure   Critical care was time spent personally by me on the following activities:  Ordering and performing treatments and interventions, ordering and review of laboratory studies, ordering and review of radiographic studies, pulse oximetry, re-evaluation of patient's  condition, review of old charts, obtaining history from patient or surrogate, examination of patient, evaluation of patient's response to treatment, discussions with primary provider, discussions with consultants and development of treatment plan with patient or surrogate    MEDICATIONS ORDERED IN ED: Medications  ipratropium-albuterol (DUONEB) 0.5-2.5 (3) MG/3ML nebulizer solution 3 mL (has no administration in time range)  dextromethorphan-guaiFENesin (MUCINEX DM) 30-600 MG per 12 hr tablet 1 tablet (has no administration in time range)  albuterol (PROVENTIL) (2.5 MG/3ML) 0.083% nebulizer solution 2.5 mg (has no administration in time range)  methylPREDNISolone sodium succinate (SOLU-MEDROL) 40 mg/mL injection 40 mg (has no administration in time range)  ondansetron (ZOFRAN) injection 4 mg (has no administration in time range)  acetaminophen (TYLENOL) tablet 650 mg (has no administration in time range)  hydrALAZINE (APRESOLINE) injection 5 mg (has no administration in time range)  azithromycin (ZITHROMAX) tablet 500 mg (has no administration in time range)    Followed by  azithromycin (ZITHROMAX) tablet 250 mg (has no administration in time range)  insulin aspart (novoLOG) injection 0-5 Units (has no administration in time range)  insulin aspart (novoLOG) injection 0-9 Units (has no administration in time range)  enoxaparin (LOVENOX) injection 40 mg (has no administration in time range)  ipratropium-albuterol (DUONEB) 0.5-2.5 (3) MG/3ML nebulizer solution 3 mL (3 mLs Nebulization Given 11/02/22 1157)  ipratropium-albuterol (DUONEB) 0.5-2.5 (3) MG/3ML nebulizer solution 3 mL (3 mLs Nebulization Given 11/02/22 1156)  methylPREDNISolone sodium succinate (SOLU-MEDROL) 125 mg/2 mL injection 125 mg (125 mg Intravenous Given 11/02/22 1201)     IMPRESSION / MDM / [place] / ED COURSE  I reviewed the triage vital signs and the nursing notes.                              Differential  diagnosis includes, but is not limited to, Asthma, copd, CHF, pna, ptx, malignancy, Pe, anemia  Patient presenting to the ER for evaluation of symptoms as described above.  Based on symptoms, risk factors and considered above differential, this presenting complaint could reflect a potentially life-threatening illness therefore the patient will be placed on continuous pulse oximetry and telemetry for monitoring.  Laboratory evaluation will be sent to evaluate for the above complaints.  Patient is ill-appearing in moderate respiratory distress with acute respiratory failure and hypoxia chronic supplemental oxygen but given his work of breathing patient will be placed on BiPAP.  Patient will be given DuoNebs as well as steroids.  He does most clinically consistent with COPD exacerbation.  Chest x-ray my review and interpretation does not show any evidence of pneumothorax  or consolidation.   Clinical Course as of 11/02/22 1247  Wed Nov 02, 2022  1215 ABG without hypercapnic respiratory failure.  Symptoms improving on BiPAP. [PR]    Clinical Course User Index [PR] Merlyn Lot, MD   Patient is clinically improving.  I have consulted with hospitalist for admission.  FINAL CLINICAL IMPRESSION(S) / ED DIAGNOSES   Final diagnoses:  COPD exacerbation  Acute respiratory failure with hypoxia     Rx / DC Orders   ED Discharge Orders     None        Note:  This document was prepared using Dragon voice recognition software and may include unintentional dictation errors.    Merlyn Lot, MD 11/02/22 1247

## 2022-11-02 NOTE — ED Triage Notes (Signed)
Pt to ED via POV from home. Pt reports SOB and cough x4-5 days with yellow sputum. Pt RA sats 89% and on 4L . Pt given 2 duonebs PTA. 18g LFA.   EMS VS:  99.6 oral  BP 122/84 HR 112 afib  CBG 150 ET 32 RR30

## 2022-11-02 NOTE — ED Notes (Signed)
Patient found up in his room attempting to use the toilet.  Primafit was disconnected and drained on the floor, and linens and gown were wet.  Bedding was changed, and patient was redirected back to bed.  Primafit was replaced, and reconnected to suction.  Patient was instructed to use call bell if he needed anything, and not to attempt getting out of bed by himself.  Patient indicated that he understood.

## 2022-11-03 DIAGNOSIS — J441 Chronic obstructive pulmonary disease with (acute) exacerbation: Secondary | ICD-10-CM | POA: Diagnosis not present

## 2022-11-03 LAB — CBC
HCT: 37.6 % — ABNORMAL LOW (ref 39.0–52.0)
Hemoglobin: 11.8 g/dL — ABNORMAL LOW (ref 13.0–17.0)
MCH: 31 pg (ref 26.0–34.0)
MCHC: 31.4 g/dL (ref 30.0–36.0)
MCV: 98.7 fL (ref 80.0–100.0)
Platelets: 310 10*3/uL (ref 150–400)
RBC: 3.81 MIL/uL — ABNORMAL LOW (ref 4.22–5.81)
RDW: 13.2 % (ref 11.5–15.5)
WBC: 12.3 10*3/uL — ABNORMAL HIGH (ref 4.0–10.5)
nRBC: 0 % (ref 0.0–0.2)

## 2022-11-03 LAB — CBG MONITORING, ED
Glucose-Capillary: 170 mg/dL — ABNORMAL HIGH (ref 70–99)
Glucose-Capillary: 167 mg/dL — ABNORMAL HIGH (ref 70–99)
Glucose-Capillary: 143 mg/dL — ABNORMAL HIGH (ref 70–99)

## 2022-11-03 LAB — BASIC METABOLIC PANEL
Anion gap: 10 (ref 5–15)
BUN: 33 mg/dL — ABNORMAL HIGH (ref 8–23)
CO2: 30 mmol/L (ref 22–32)
Calcium: 9.1 mg/dL (ref 8.9–10.3)
Chloride: 99 mmol/L (ref 98–111)
Creatinine, Ser: 1.1 mg/dL (ref 0.61–1.24)
GFR, Estimated: >60 mL/min (ref >60)
Glucose, Bld: 186 mg/dL — ABNORMAL HIGH (ref 70–99)
Potassium: 3.6 mmol/L (ref 3.5–5.1)
Sodium: 139 mmol/L (ref 135–145)

## 2022-11-03 LAB — LACTIC ACID, PLASMA
Lactic Acid, Venous: 2 mmol/L (ref 0.5–1.9)
Lactic Acid, Venous: 2.2 mmol/L (ref 0.5–1.9)

## 2022-11-03 LAB — GLUCOSE, CAPILLARY: Glucose-Capillary: 114 mg/dL — ABNORMAL HIGH (ref 70–99)

## 2022-11-03 MED ORDER — IPRATROPIUM-ALBUTEROL 0.5-2.5 (3) MG/3ML IN SOLN
3.0000 mL | Freq: Four times a day (QID) | RESPIRATORY_TRACT | Status: DC
  Administered 2022-11-04 (×2): 3 mL via RESPIRATORY_TRACT
  Filled 2022-11-03 (×2): qty 3

## 2022-11-03 MED ORDER — METOPROLOL TARTRATE 25 MG PO TABS
25.0000 mg | ORAL_TABLET | Freq: Two times a day (BID) | ORAL | Status: DC
  Administered 2022-11-03 – 2022-11-04 (×2): 25 mg via ORAL
  Filled 2022-11-03 (×2): qty 1

## 2022-11-03 NOTE — ED Notes (Signed)
This RN to bedside, p't's [place] is off of his nose again. Pt. Reminded by this RN that his oxygen keeps dropping when he removes oxygen,and he needs to keep [place] on.

## 2022-11-03 NOTE — ED Notes (Signed)
Patient has been steadily decreasing in oxygen saturation on nasal canula.  Nasal canula was increased slowly up to 6 liters, and patient was unable to maintain satisfactory saturation levels.  Patient was placed on a non-rebreather, and saturation levels have maintained in the mid - upper nineties.

## 2022-11-03 NOTE — ED Notes (Signed)
Mary RN to pt's bedside for pulse ox reading 72%, while primary RN Stanton Kidney reports pt. Once again, has his nasal cannula is out of his nose and resting beneath his chin. Mary RN tells pt. That his oxygen level is dangerously low, and he absolutely MUST keep his [place] in place. This RN notified Dr. Posey Pronto a second time of pt's beh.

## 2022-11-03 NOTE — ED Notes (Signed)
This RN to bedside, p't's [place] is off of his nose again. Pt. Reminded by this RN that his oxygen keeps dropping when he removes oxygen,and he needs to keep [place] on.

## 2022-11-03 NOTE — Progress Notes (Signed)
Patient states he does not want a blood transfusion under any circumstances.

## 2022-11-03 NOTE — ED Notes (Signed)
Dr. Patel at bedside 

## 2022-11-03 NOTE — ED Notes (Signed)
Pt. Repositioned in bed, set up, reminded to keep nasal cannula in nose. Pt's lunch set up, pt. Eating lunch.

## 2022-11-03 NOTE — ED Notes (Signed)
This student and RN Elana, came in to do our first shift assessment and found pts legs hanging off the end of the bed and pt had urinated the bed. Pt was not hooked up to cardiac monitoring.    We did a complete bed and gown change, pulled pt up in the bed, put on a new male purewick,  and hooked pt up to cardiac monitoring.   Call bell in reach. No other needs at this time.

## 2022-11-03 NOTE — Progress Notes (Signed)
[facility] [place] NAME: Cody Bridges    MR#:  [id number]  DATE OF BIRTH:  Apr 11, 1933  SUBJECTIVE:  no family at bedside. Patient trying to eat some lunch. Keeps removing his oxygen. Sats drop in the upper 80s. Denies any chest pain. No family at bedside. Poor historian. Apparently came in with increasing shortness of breath and diagnosed with COPD flare.    VITALS:  Blood pressure 129/73, pulse 94, temperature 98.6 F (37 C), temperature source Oral, resp. rate (!) 23, weight 93 kg, SpO2 95 %.  PHYSICAL EXAMINATION:   GENERAL:  89 y.o.-year-old patient with no acute distress. weak LUNGS:decreased breath sounds bilaterally, no wheezing CARDIOVASCULAR: S1, S2 normal. No murmur   ABDOMEN: Soft, nontender, nondistended.  EXTREMITIES: No  edema b/l.    NEUROLOGIC: nonfocal  patient is alert and awake  LABORATORY PANEL:  CBC Recent Labs  Lab 11/03/22 0650  WBC 12.3*  HGB 11.8*  HCT 37.6*  PLT 310    Chemistries  Recent Labs  Lab 11/03/22 0650  NA 139  K 3.6  CL 99  CO2 30  GLUCOSE 186*  BUN 33*  CREATININE 1.10  CALCIUM 9.1   Cardiac Enzymes No results for input(s): "TROPONINI" in the last 168 hours. RADIOLOGY:  DG Chest 2 View  Result Date: 11/02/2022 CLINICAL DATA:  Shortness of breath EXAM: CHEST - 2 VIEW COMPARISON:  02/13/2021 FINDINGS: Mild cardiomegaly and aortic tortuosity. There is no edema, consolidation, effusion, or pneumothorax. IMPRESSION: No evidence of active disease. Electronically Signed   By: Jorje Guild M.D.   On: 11/02/2022 09:40    Assessment and Plan Cody Bridges is a 89 y.o. male with medical history significant of COPD/asthma, hypertension, hyperlipidemia, diabetes mellitus, diastolic CHF, GERD, BPH, hard of hearing, who presents with SOB.  Patient is normally not using oxygen, but was found to have oxygen saturation 89% on room air. Initially started on 4 L oxygen, but pt still has severe  respiratory distress, cannot speak in full sentence, using accessory muscle for breathing.  BiPAP is started in ED.   Currently off bipap  Acute respiratory failure with hypoxia due to COPD/asthma exacerbation: Patient has severe respiratory distress, requiring BiPAP.  Chest x-ray negative. Pt is using anti-IL 4 receptor antibody, Dupilumab at home.   -BiPAP --> weaned  off -Bronchodilators -Solu-Medrol 40 mg IV bid--chang eto po in am if stable -Azithromycin (500 mg by IV, then 250 mg orally) -Mucinex for cough  -Incentive spirometry -sputum culture -Nasal cannula oxygen as needed to maintain O2 saturation 93% or greater when pt is off BiPAP   Hypertension -IV hydralazine as needed -Amlodipine, HCTZ   Chronic diastolic CHF (congestive heart failure): 2D echo on 10/18/2021 showed EF of 55% with grade 1 diastolic dysfunction.  Patient has trace leg edema, no JVD.  No pulmonary edema by chest x-ray.  CHF seem to be compensated. -Patient is on HCTZ   HLD (hyperlipidemia) -Crestor   Diabetes mellitus without complication: Recent 123456 6.9, well-controlled.  Patient taking metformin -SSI   BPH (benign prostatic hyperplasia) -Flomax, Proscar         DVT ppx:  SQ Lovenox   Code Status: Full code per pt   Family Communication:  none today   Disposition Plan:  Anticipate discharge back to previous environment   Consults called:  none Level of care: Progressive Status is: Inpatient Remains inpatient appropriate because: COPD flare PT/OT to seept  TOTAL TIME TAKING CARE OF THIS PATIENT: 35 minutes.  >50% time spent on counselling and coordination of care  Note: This dictation was prepared with Dragon dictation along with smaller phrase technology. Any transcriptional errors that result from this process are unintentional.  Fritzi Mandes M.D    [facility]   CC: Primary care physician; Leone Haven, MD

## 2022-11-03 NOTE — ED Notes (Signed)
This RN to bedside, p't's Cody Bridges is off of his nose again. Pt. Reminded by this RN that his oxygen keeps dropping when he removes oxygen,and he needs to keep [place] on.

## 2022-11-04 DIAGNOSIS — Z7189 Other specified counseling: Secondary | ICD-10-CM

## 2022-11-04 DIAGNOSIS — Z515 Encounter for palliative care: Secondary | ICD-10-CM

## 2022-11-04 DIAGNOSIS — J9601 Acute respiratory failure with hypoxia: Secondary | ICD-10-CM | POA: Diagnosis not present

## 2022-11-04 DIAGNOSIS — J441 Chronic obstructive pulmonary disease with (acute) exacerbation: Secondary | ICD-10-CM | POA: Diagnosis not present

## 2022-11-04 LAB — GLUCOSE, CAPILLARY
Glucose-Capillary: 141 mg/dL — ABNORMAL HIGH (ref 70–99)
Glucose-Capillary: 145 mg/dL — ABNORMAL HIGH (ref 70–99)
Glucose-Capillary: 209 mg/dL — ABNORMAL HIGH (ref 70–99)
Glucose-Capillary: 200 mg/dL — ABNORMAL HIGH (ref 70–99)

## 2022-11-04 LAB — MRSA NEXT GEN BY PCR, NASAL: MRSA by PCR Next Gen: NOT DETECTED

## 2022-11-04 MED ORDER — LATANOPROST 0.005 % OP SOLN
1.0000 [drp] | Freq: Every day | OPHTHALMIC | Status: DC
  Administered 2022-11-04 – 2022-11-07 (×2): 1 [drp] via OPHTHALMIC
  Filled 2022-11-04 (×2): qty 2.5

## 2022-11-04 MED ORDER — IPRATROPIUM-ALBUTEROL 0.5-2.5 (3) MG/3ML IN SOLN
3.0000 mL | RESPIRATORY_TRACT | Status: DC
  Administered 2022-11-04 – 2022-11-05 (×7): 3 mL via RESPIRATORY_TRACT
  Filled 2022-11-04 (×7): qty 3

## 2022-11-04 MED ORDER — DOCUSATE SODIUM 100 MG PO CAPS
100.0000 mg | ORAL_CAPSULE | Freq: Two times a day (BID) | ORAL | Status: DC | PRN
  Filled 2022-11-04: qty 1

## 2022-11-04 MED ORDER — POLYETHYLENE GLYCOL 3350 17 G PO PACK
17.0000 g | PACK | Freq: Every day | ORAL | Status: DC
  Administered 2022-11-04 – 2022-11-08 (×4): 17 g via ORAL
  Filled 2022-11-04 (×5): qty 1

## 2022-11-04 MED ORDER — HYDROCHLOROTHIAZIDE 25 MG PO TABS
25.0000 mg | ORAL_TABLET | Freq: Every day | ORAL | Status: DC
  Administered 2022-11-04 – 2022-11-08 (×5): 25 mg via ORAL
  Filled 2022-11-04 (×5): qty 1

## 2022-11-04 MED ORDER — ENOXAPARIN SODIUM 60 MG/0.6ML IJ SOSY
0.5000 mg/kg | PREFILLED_SYRINGE | INTRAMUSCULAR | Status: DC
  Administered 2022-11-04 – 2022-11-07 (×4): 45 mg via SUBCUTANEOUS
  Filled 2022-11-04 (×4): qty 0.6

## 2022-11-04 NOTE — TOC Initial Note (Signed)
Transition of Care [facility](TOC) - Initial/Assessment Note    Patient Details  Name: Cody Bridges MRN: [id number] Date of Birth: 03/12/1933  Transition of Care [facility](TOC) CM/SW Contact:    Truddie HiddenKeona C Tashya Alberty, RN Phone Number: 11/04/2022, 12:41 PM  Clinical Narrative:                 Patient disoriented. Attempted to reach patient's daughter. No answer. Left a message.          Patient Goals and CMS Choice            Expected Discharge Plan and Services                                              Prior Living Arrangements/Services                       Activities of Daily Living Home Assistive Devices/Equipment: Dan HumphreysWalker (specify type) ADL Screening (condition at time of admission) Patient's cognitive ability adequate to safely complete daily activities?: Yes Is the patient deaf or have difficulty hearing?: Yes Does the patient have difficulty seeing, even when wearing glasses/contacts?: No Does the patient have difficulty concentrating, remembering, or making decisions?: No Patient able to express need for assistance with ADLs?: Yes Does the patient have difficulty dressing or bathing?: Yes Independently performs ADLs?: No Communication: Independent Dressing (OT): Needs assistance Is this a change from baseline?: Pre-admission baseline Grooming: Needs assistance Is this a change from baseline?: Pre-admission baseline Feeding: Independent Bathing: Needs assistance Is this a change from baseline?: Pre-admission baseline Toileting: Needs assistance Is this a change from baseline?: Pre-admission baseline In/Out Bed: Needs assistance Is this a change from baseline?: Pre-admission baseline Walks in Home: Needs assistance Is this a change from baseline?: Pre-admission baseline Does the patient have difficulty walking or climbing stairs?: Yes Weakness of Legs: Both Weakness of Arms/Hands: None  Permission Sought/Granted                  Emotional  Assessment              Admission diagnosis:  COPD exacerbation [J44.1] Acute respiratory failure with hypoxia [J96.01] Patient Active Problem List   Diagnosis Date Noted   COPD exacerbation 11/02/2022   Acute respiratory failure with hypoxia 11/02/2022   HLD (hyperlipidemia) 11/02/2022   Chronic diastolic CHF (congestive heart failure) 11/02/2022   Asthma exacerbation 11/02/2022   BPH (benign prostatic hyperplasia) 06/13/2022   Loose stools 06/13/2022   DOE (dyspnea on exertion) 06/13/2022   Palpitations 06/13/2022   Allergic rhinitis 09/10/2021   Foot callus 05/31/2021   History of migraine 11/10/2020   Cardiac syncope 10/22/2019   Intramuscular lipoma 10/12/2018   Bilateral carotid artery stenosis 10/05/2018   Atherosclerotic peripheral vascular disease with intermittent claudication 09/04/2018   Premature atrial complex 07/04/2018   Urine frequency 03/06/2018   Benign prostatic hyperplasia with urinary frequency 03/06/2018   Weakness of both lower extremities 05/01/2017   Orthostasis 05/01/2017   Lung nodule    Bilateral leg edema 01/26/2017   Rash and nonspecific skin eruption 10/25/2016   Diastasis recti 10/24/2016   Non-recurrent unilateral inguinal hernia without obstruction or gangrene 10/24/2016   Hypertension 10/24/2016   Diabetes mellitus without complication 09/20/2016   COPD (chronic obstructive pulmonary disease) 09/20/2016   Colon polyps 08/29/2014   Hyperlipemia, mixed 02/05/2014   PCP:  Glori Luis, MD Pharmacy:   [facility] [phone] [place], [facility] - [address] [place] [address] Phone: [phone] Fax: [phone]  [facility] [place], [place] - [address] [place] [address] Phone: [phone] Fax: [phone]  [facility], [place], [place] - [address] [place] [address] Phone: [phone] Fax:  [phone]     Social Determinants of Health (SDOH) Social History: SDOH Screenings   Food Insecurity: No Food Insecurity (11/03/2022)  Housing: Low Risk  (11/03/2022)  Transportation Needs: No Transportation Needs (11/03/2022)  Utilities: Not At Risk (11/03/2022)  Depression (PHQ2-9): Low Risk  (06/13/2022)  Tobacco Use: Medium Risk (11/02/2022)   SDOH Interventions:     Readmission Risk Interventions     No data to display

## 2022-11-04 NOTE — NC FL2 (Signed)
[place] MEDICAID FL2 LEVEL OF CARE FORM     IDENTIFICATION  Patient Name: Cody Bridges Birthdate: 09-Apr-1933 Sex: male Admission Date (Current Location): 11/02/2022  [facility] [place] and [place] Number:  Chiropodist and Address:  [facility], [address] [place], [place] [address]      Provider Number: [id number]  Attending Physician Name and Address:  Enedina Finner, MD  Relative Name and Phone Number:  Mayjor, Miyahira (Daughter) [phone] (    Current Level of Care: Hospital Recommended Level of Care: Skilled Nursing Facility Prior Approval Number:    Date Approved/Denied:   PASRR Number: [id number]  Discharge Plan: SNF    Current Diagnoses: Patient Active Problem List   Diagnosis Date Noted   COPD exacerbation 11/02/2022   Acute respiratory failure with hypoxia 11/02/2022   HLD (hyperlipidemia) 11/02/2022   Chronic diastolic CHF (congestive heart failure) 11/02/2022   Asthma exacerbation 11/02/2022   BPH (benign prostatic hyperplasia) 06/13/2022   Loose stools 06/13/2022   DOE (dyspnea on exertion) 06/13/2022   Palpitations 06/13/2022   Allergic rhinitis 09/10/2021   Foot callus 05/31/2021   History of migraine 11/10/2020   Cardiac syncope 10/22/2019   Intramuscular lipoma 10/12/2018   Bilateral carotid artery stenosis 10/05/2018   Atherosclerotic peripheral vascular disease with intermittent claudication 09/04/2018   Premature atrial complex 07/04/2018   Urine frequency 03/06/2018   Benign prostatic hyperplasia with urinary frequency 03/06/2018   Weakness of both lower extremities 05/01/2017   Orthostasis 05/01/2017   Lung nodule    Bilateral leg edema 01/26/2017   Rash and nonspecific skin eruption 10/25/2016   Diastasis recti 10/24/2016   Non-recurrent unilateral inguinal hernia without obstruction or gangrene 10/24/2016   Hypertension 10/24/2016   Diabetes mellitus without complication 09/20/2016   COPD (chronic  obstructive pulmonary disease) 09/20/2016   Colon polyps 08/29/2014   Hyperlipemia, mixed 02/05/2014    Orientation RESPIRATION BLADDER Height & Weight     Self, Place  O2 Incontinent, External catheter Weight: 197 lb 12 oz (89.7 kg) Height:     BEHAVIORAL SYMPTOMS/MOOD NEUROLOGICAL BOWEL NUTRITION STATUS      Continent Diet  AMBULATORY STATUS COMMUNICATION OF NEEDS Skin   Extensive Assist Verbally Normal                       Personal Care Assistance Level of Assistance  Bathing, Feeding, Dressing Bathing Assistance: Maximum assistance Feeding assistance: Limited assistance Dressing Assistance: Maximum assistance     Functional Limitations Info  Hearing, Speech, Sight Sight Info: Adequate Hearing Info: Adequate Speech Info: Adequate    SPECIAL CARE FACTORS FREQUENCY  PT (By licensed PT), OT (By licensed OT)     PT Frequency: 5 times a week OT Frequency: 5 times a week            Contractures Contractures Info: Not present    Additional Factors Info  Allergies, Code Status Code Status Info: FULL Allergies Info: Clindamycin  Lisinopril  Penicillin V Potassium           Current Medications (11/04/2022):  This is the current hospital active medication list Current Facility-Administered Medications  Medication Dose Route Frequency Provider Last Rate Last Admin   acetaminophen (TYLENOL) tablet 650 mg  650 mg Oral Q6H PRN Lorretta Harp, MD       albuterol (PROVENTIL) (2.5 MG/3ML) 0.083% nebulizer solution 2.5 mg  2.5 mg Nebulization Q4H PRN Lorretta Harp, MD   2.5 mg at 11/04/22 1013   amLODipine (  NORVASC) tablet 5 mg  5 mg Oral Daily Lorretta HarpNiu, Xilin, MD   5 mg at 11/04/22 1004   azelastine (ASTELIN) 0.1 % nasal spray 1 spray  1 spray Each Nare Q1400 Lorretta HarpNiu, Xilin, MD       azithromycin [facility](ZITHROMAX) tablet 250 mg  250 mg Oral Daily Ronnald Rampatel, Kishan S, RPH   250 mg at 11/04/22 1004   calcium carbonate (TUMS - dosed in mg elemental calcium) chewable tablet 200-400 mg of elemental  calcium  1-2 tablet Oral TID PRN Lorretta HarpNiu, Xilin, MD       cholecalciferol (VITAMIN D3) 25 MCG (1000 UNIT) tablet 5,000 Units  5,000 Units Oral Daily Lorretta HarpNiu, Xilin, MD   5,000 Units at 11/04/22 1005   dextromethorphan-guaiFENesin (MUCINEX DM) 30-600 MG per 12 hr tablet 1 tablet  1 tablet Oral BID PRN Lorretta HarpNiu, Xilin, MD   1 tablet at 11/03/22 0055   docusate sodium (COLACE) capsule 100 mg  100 mg Oral BID PRN Enedina FinnerPatel, Sona, MD       enoxaparin (LOVENOX) injection 45 mg  0.5 mg/kg Subcutaneous Q24H Patel, Kishan S, RPH       finasteride (PROSCAR) tablet 5 mg  5 mg Oral Daily Lorretta HarpNiu, Xilin, MD   5 mg at 11/04/22 1004   hydrALAZINE (APRESOLINE) injection 5 mg  5 mg Intravenous Q2H PRN Lorretta HarpNiu, Xilin, MD       hydrochlorothiazide (HYDRODIURIL) tablet 25 mg  25 mg Oral Daily Enedina FinnerPatel, Sona, MD   25 mg at 11/04/22 1309   insulin aspart (novoLOG) injection 0-5 Units  0-5 Units Subcutaneous QHS Lorretta HarpNiu, Xilin, MD   0 Units at 11/02/22 2219   insulin aspart (novoLOG) injection 0-9 Units  0-9 Units Subcutaneous TID WC Lorretta HarpNiu, Xilin, MD   1 Units at 11/04/22 1309   ipratropium-albuterol (DUONEB) 0.5-2.5 (3) MG/3ML nebulizer solution 3 mL  3 mL Nebulization QID Enedina FinnerPatel, Sona, MD   3 mL at 11/04/22 1507   latanoprost (XALATAN) 0.005 % ophthalmic solution 1 drop  1 drop Left Eye QHS Enedina FinnerPatel, Sona, MD       methylPREDNISolone sodium succinate (SOLU-MEDROL) 40 mg/mL injection 40 mg  40 mg Intravenous Q12H Lorretta HarpNiu, Xilin, MD   40 mg at 11/04/22 1021   multivitamin with minerals tablet 1 tablet  1 tablet Oral Daily Lorretta HarpNiu, Xilin, MD   1 tablet at 11/04/22 1004   ondansetron (ZOFRAN) injection 4 mg  4 mg Intravenous Q8H PRN Lorretta HarpNiu, Xilin, MD       polyethylene glycol (MIRALAX / GLYCOLAX) packet 17 g  17 g Oral Daily Enedina FinnerPatel, Sona, MD       polyvinyl alcohol (LIQUIFILM TEARS) 1.4 % ophthalmic solution 1 drop  1 drop Both Eyes TID PRN Lorretta HarpNiu, Xilin, MD       rosuvastatin (CRESTOR) tablet 20 mg  20 mg Oral Daily Lorretta HarpNiu, Xilin, MD   20 mg at 11/04/22 1004   tamsulosin  (FLOMAX) capsule 0.4 mg  0.4 mg Oral Daily Lorretta HarpNiu, Xilin, MD   0.4 mg at 11/04/22 1004     Discharge Medications: Please see discharge summary for a list of discharge medications.  Relevant Imaging Results:  Relevant Lab Results:   Additional Information SS# [id number]1630  Raunak Antuna, LCSW

## 2022-11-04 NOTE — Progress Notes (Signed)
[facility]  - [place] at [facility]   PATIENT NAME: Cody Bridges    MR#:  [id number]  DATE OF BIRTH:  08/02/1932  SUBJECTIVE:  no family at bedside.  No family at bedside. Poor historian. Apparently came in with increasing shortness of breath and diagnosed with COPD flare. Quite weekend deconditioned. Sats drop down easily when PT attempted to move patient in bed. Patient is a mouth breather. Does not wear oxygen at home    VITALS:  Blood pressure 131/84, pulse 88, temperature 98.1 F (36.7 C), resp. rate (!) 22, weight 89.7 kg, SpO2 99 %.  PHYSICAL EXAMINATION:   GENERAL:  89 y.o.-year-old patient with no acute distress. weak, deconditioned LUNGS:decreased breath sounds bilaterally, no wheezing CARDIOVASCULAR: S1, S2 normal. No murmur   ABDOMEN: Soft, nontender, nondistended.  EXTREMITIES: No  edema b/l.    NEUROLOGIC: nonfocal  patient is alert and awake  LABORATORY PANEL:  CBC Recent Labs  Lab 11/03/22 0650  WBC 12.3*  HGB 11.8*  HCT 37.6*  PLT 310     Chemistries  Recent Labs  Lab 11/03/22 0650  NA 139  K 3.6  CL 99  CO2 30  GLUCOSE 186*  BUN 33*  CREATININE 1.10  CALCIUM 9.1    Cardiac Enzymes No results for input(s): "TROPONINI" in the last 168 hours. RADIOLOGY:  No results found.  Assessment and Plan Cody Bridges is a 89 y.o. male with medical history significant of COPD/asthma, hypertension, hyperlipidemia, diabetes mellitus, diastolic CHF, GERD, BPH, hard of hearing, who presents with SOB.  Patient is normally not using oxygen, but was found to have oxygen saturation 89% on room air. Initially started on 4 L oxygen, but pt still has severe respiratory distress, cannot speak in full sentence, using accessory muscle for breathing.  BiPAP is started in ED.   Currently off bipap  Acute respiratory failure with hypoxia due to COPD/asthma exacerbation/Emphysema: Patient has severe respiratory distress, requiring BiPAP.  Chest  x-ray negative. Pt is using anti-IL 4 receptor antibody, Dupilumab at home.   -BiPAP --> weaned  off -Bronchodilators -Solu-Medrol 40 mg IV bid--chang eto po in am if stable -Azithromycin (500 mg by IV, then 250 mg orally) -Mucinex for cough  -Incentive spirometry -sputum culture -Nasal cannula oxygen as needed to maintain O2 saturation 93% or greater when pt is off BiPAP -- drops sats easily with minimal exertion. Placed on heated high flow nasal cannula oxygen. -will do Pulmonary consult with Dr Meredeth IdeFleming   Hypertension -IV hydralazine as needed -Amlodipine, HCTZ   Chronic diastolic CHF (congestive heart failure): 2D echo on 10/18/2021 showed EF of 55% with grade 1 diastolic dysfunction.  Patient has trace leg edema, no JVD.  No pulmonary edema by chest x-ray.  CHF seem to be compensated.    HLD (hyperlipidemia) -Crestor   Diabetes mellitus without complication: Recent A1c 6.9, well-controlled.  Patient taking metformin -SSI   BPH (benign prostatic hyperplasia) -Flomax, Proscar   Will have palliative care see pt to discuss GOC  I have left VM for dter Rosalita ChessmanSuzanne Bambach     DVT ppx:  SQ Lovenox   Code Status: Full code per pt   Family Communication: left VM for [employer] CompanySuzanne Postema   Disposition Plan:  Anticipate discharge back to previous environment   Consults called:  Palliaitive Level of care: Progressive Status is: Inpatient Remains inpatient appropriate because: COPD flare PT/OT to see pt    TOTAL TIME TAKING CARE OF THIS PATIENT: 35 minutes.  >  50% time spent on counselling and coordination of care  Note: This dictation was prepared with Dragon dictation along with smaller phrase technology. Any transcriptional errors that result from this process are unintentional.  Enedina Finner M.D    [facility]   CC: Primary care physician; Glori Luis, MD

## 2022-11-04 NOTE — Progress Notes (Addendum)
Left voicemail for Dr Meredeth Ide, sent secure chat and tried to page reagrding this pt's pulmonary consult Spoke with Dr Meredeth Ide

## 2022-11-04 NOTE — Plan of Care (Signed)
  Problem: Education: Goal: Ability to describe self-care measures that may prevent or decrease complications (Diabetes Survival Skills Education) will improve Outcome: Progressing Goal: Individualized Educational Video(s) Outcome: Progressing   Problem: Coping: Goal: Ability to adjust to condition or change in health will improve Outcome: Progressing   Problem: Fluid Volume: Goal: Ability to maintain a balanced intake and output will improve Outcome: Progressing   Problem: Health Behavior/Discharge Planning: Goal: Ability to identify and utilize available resources and services will improve Outcome: Progressing Goal: Ability to manage health-related needs will improve Outcome: Progressing   Problem: Metabolic: Goal: Ability to maintain appropriate glucose levels will improve Outcome: Progressing   Problem: Nutritional: Goal: Maintenance of adequate nutrition will improve Outcome: Progressing Goal: Progress toward achieving an optimal weight will improve Outcome: Progressing   Problem: Skin Integrity: Goal: Risk for impaired skin integrity will decrease Outcome: Progressing   Problem: Tissue Perfusion: Goal: Adequacy of tissue perfusion will improve Outcome: Progressing   Problem: Education: Goal: Knowledge of disease or condition will improve Outcome: Progressing Goal: Knowledge of the prescribed therapeutic regimen will improve Outcome: Progressing Goal: Individualized Educational Video(s) Outcome: Progressing   Problem: Activity: Goal: Ability to tolerate increased activity will improve Outcome: Progressing Goal: Will verbalize the importance of balancing activity with adequate rest periods Outcome: Progressing   Problem: Respiratory: Goal: Ability to maintain a clear airway will improve Outcome: Progressing Goal: Levels of oxygenation will improve Outcome: Progressing Goal: Ability to maintain adequate ventilation will improve Outcome: Progressing    Problem: Education: Goal: Knowledge of General Education information will improve Description: Including pain rating scale, medication(s)/side effects and non-pharmacologic comfort measures Outcome: Progressing   Problem: Health Behavior/Discharge Planning: Goal: Ability to manage health-related needs will improve Outcome: Progressing   Problem: Clinical Measurements: Goal: Ability to maintain clinical measurements within normal limits will improve Outcome: Progressing Goal: Will remain free from infection Outcome: Progressing Goal: Diagnostic test results will improve Outcome: Progressing Goal: Respiratory complications will improve Outcome: Progressing Goal: Cardiovascular complication will be avoided Outcome: Progressing   Problem: Activity: Goal: Risk for activity intolerance will decrease Outcome: Progressing   Problem: Nutrition: Goal: Adequate nutrition will be maintained Outcome: Progressing   Problem: Coping: Goal: Level of anxiety will decrease Outcome: Progressing   Problem: Elimination: Goal: Will not experience complications related to bowel motility Outcome: Progressing Goal: Will not experience complications related to urinary retention Outcome: Progressing   Problem: Pain Managment: Goal: General experience of comfort will improve Outcome: Progressing   Problem: Safety: Goal: Ability to remain free from injury will improve Outcome: Progressing   Problem: Skin Integrity: Goal: Risk for impaired skin integrity will decrease Outcome: Progressing   

## 2022-11-04 NOTE — Progress Notes (Addendum)
OT Cancellation Note  Patient Details Name: Cody Bridges MRN: [id number] DOB: March 27, 1933   Cancelled Treatment:    Reason Eval/Treat Not Completed: Fatigue/lethargy limiting ability to participate;Medical issues which prohibited therapy. Pt with increased oxygen requirement; stating he does not feel like he can mobilize right now. Provided prior history information:  Lives in apartment with girlfriend at [facility], no steps to enter, 1 level. Pt has SPC and rollator; usually uses rollator. Spends most of his time in his recliner. On room air at baseline; denies falls; states he was participating with PT prior to admission. States his girlfriend assists with all IADLs; states he is (I) with most ADLs, although girlfriend assists with transfer into walk-in shower (with shower seat); states there is one grab bar by the toilet. Girlfriend assists with medications. Pt is oriented to person, place, and date (early April, 2024).  [name] 11/04/2022, 3:02 PM

## 2022-11-04 NOTE — Care Management Important Message (Signed)
Important Message  Patient Details  Name: Cody Bridges MRN: [id number] Date of Birth: 1933/01/29   Medicare Important Message Given:  Yes     Johnell Comings 11/04/2022, 12:47 PM

## 2022-11-04 NOTE — Evaluation (Signed)
Physical Therapy Evaluation Patient Details Name: Cody Bridges MRN: [id number] DOB: [birth year] Today's Date: 11/04/2022  History of Present Illness  Pt is an 24 male that presented to ED for SOB, placed on bipap. PMH of COPD/asthma, hypertension, hyperlipidemia, diabetes mellitus, diastolic CHF, GERD, BPH, hard of hearing.   Clinical Impression  Patient was alert, oriented to self, place, situation. Reported at baseline he lives at [facility] with his girlfriend, does not normally need assistance for ADLs or ambulation. While speaking to PT pt noted to be SOB and needed to take breaks. Followed all commands and able to move limbs against gravity. totalAx2 to scoot towards HOB. Mobility assessment held this session due to pt respiratory status; on 6L HFNC and spO2 ranging in mid 90s, however two instances of pt significantly challenged to catch is breath, charge RN notified and in room to assess patient.  Overall the patient demonstrated deficits (see "PT Problem List") that impede the patient's functional abilities, safety, and mobility and would benefit from skilled PT intervention. Recommendation is continued skilled PT intervention and ongoing assessment of mobility to maximize function and safety.        Recommendations for follow up therapy are one component of a multi-disciplinary discharge planning process, led by the attending physician.  Recommendations may be updated based on patient status, additional functional criteria and insurance authorization.  Follow Up Recommendations Can patient physically be transported by private vehicle: No     Assistance Recommended at Discharge Frequent or constant Supervision/Assistance  Patient can return home with the following  A lot of help with walking and/or transfers;A lot of help with bathing/dressing/bathroom;Assistance with cooking/housework;Direct supervision/assist for medications management;Assist for transportation;Direct  supervision/assist for financial management;Help with stairs or ramp for entrance    Equipment Recommendations Other (comment) (TBD at next venue of care)  Recommendations for Other Services       Functional Status Assessment Patient has had a recent decline in their functional status and demonstrates the ability to make significant improvements in function in a reasonable and predictable amount of time.     Precautions / Restrictions Precautions Precautions: Fall Restrictions Weight Bearing Restrictions: No      Mobility  Bed Mobility               General bed mobility comments: totalAx2 to scoot up towards [facility]    Transfers                        Ambulation/Gait                  Stairs            Wheelchair Mobility    Modified Rankin (Stroke Patients Only)       Balance                                             Pertinent Vitals/Pain Pain Assessment Pain Assessment: No/denies pain    Home Living Family/patient expects to be discharged to:: Private residence Living Arrangements: Spouse/significant other Available Help at Discharge: Family Type of Home: Apartment Home Access: Level entry       Home Layout: One level Home Equipment: Agricultural consultant (2 wheels);Wheelchair - manual Additional Comments: per pt he lives [facility]    Prior Function Prior Level of Function : Independent/Modified Independent  Hand Dominance        Extremity/Trunk Assessment   Upper Extremity Assessment Upper Extremity Assessment: Generalized weakness (able to move against gravity)    Lower Extremity Assessment Lower Extremity Assessment: Generalized weakness (able to move against gravity)       Communication   Communication: HOH  Cognition Arousal/Alertness: Awake/alert Behavior During Therapy: WFL for tasks assessed/performed Overall Cognitive Status: Within Functional Limits for  tasks assessed                                          General Comments      Exercises     Assessment/Plan    PT Assessment Patient needs continued PT services  PT Problem List Decreased strength;Decreased mobility;Decreased range of motion;Decreased coordination;Decreased activity tolerance;Cardiopulmonary status limiting activity;Decreased balance;Decreased knowledge of use of DME       PT Treatment Interventions DME instruction;Therapeutic activities;Gait training;Stair training;Balance training;Functional mobility training;Therapeutic exercise;Patient/family education;Neuromuscular re-education    PT Goals (Current goals can be found in the Care Plan section)  Acute Rehab PT Goals Patient Stated Goal: to feel better PT Goal Formulation: With patient Time For Goal Achievement: 11/18/22 Potential to Achieve Goals: Fair    Frequency Min 2X/week     Co-evaluation               AM-PAC PT "6 Clicks" Mobility  Outcome Measure Help needed turning from your back to your side while in a flat bed without using bedrails?: A Lot Help needed moving from lying on your back to sitting on the side of a flat bed without using bedrails?: A Lot Help needed moving to and from a bed to a chair (including a wheelchair)?: A Lot Help needed standing up from a chair using your arms (e.g., wheelchair or bedside chair)?: A Lot Help needed to walk in hospital room?: Total Help needed climbing 3-5 steps with a railing? : Total 6 Click Score: 10    End of Session Equipment Utilized During Treatment: Oxygen;Other (comment) (6L) Activity Tolerance: Other (comment) (limited by SOB) Patient left: in bed;with call bell/phone within reach;with bed alarm set Nurse Communication: Mobility status;Other (comment) (oxygen status) PT Visit Diagnosis: Other abnormalities of gait and mobility (R26.89)    Time: [id number] PT Time Calculation (min) (ACUTE ONLY): 13 min   Charges:    PT Evaluation $PT Eval Moderate Complexity: 1 Mod          Olga Coasteriana Wanna Gully PT, DPT 11:22 AM,11/04/22

## 2022-11-04 NOTE — Consult Note (Signed)
Consultation Note Date: 11/04/2022   Patient Name: Cody MedinRobert Lee Bridges  DOB: 05/16/1933  MRN: [id number]  Age / Sex: 89 y.o., male  PCP: Glori LuisSonnenberg, Eric G, MD Referring Physician: Enedina FinnerPatel, Sona, MD  Reason for Consultation: Establishing goals of care   HPI/Brief Hospital Course: 89 y.o. male  with past medical history of COPD, HTN, HLD, T2DM, diastolic CHF, BPH and HOH admitted from [facility] (assuming independent living facility) on 11/02/2022 with increased shortness of breath.  Found to be hypoxic with ongoing increased work of breathing-intermittently placed on bi-pap in ED-successfully weaned off  Admitted for acute respiratory failure with hypoxia due to COPD exacerbation-remains on high flow nasal cannula   Palliative medicine was consulted for assisting with goals of care conversations.  Subjective:  Extensive chart review has been completed prior to meeting patient including labs, vital signs, imaging, progress notes, orders, and available advanced directive documents from current and previous encounters.  Introduced myself as a Publishing rights managernurse practitioner as a member of the palliative care team. Explained palliative medicine is specialized medical care for people living with serious illness. It focuses on providing relief from the symptoms and stress of a serious illness. The goal is to improve quality of life for both the patient and the family.   Visited with Cody Bridges at his bedside. Alert and oriented, able to answer orientation questions appropriately including recalling days of week backwards starting from Wednesday. Denies acute pain or discomfort. Able to share he was given a "Arts administratorbaby sitter" because he did not want to stay in bed or keep his oxygen on. Shares his understanding of severity of illness and importance of keeping oxygen on and risks of falling if getting out of bed.  Shares a brief life review. 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 He enjoyed playing pool for fun-has not been able to play in a few years. Widowed x2, divorced x1, currently with Sharma CovertValerie-long term girlfriend who is originally [place]. Has one daughter-Cody Bridges. Has not completed Advanced Directives, wishes to appoint Cody ChessmanSuzanne as his HCPOA if he is unable to speak for himself.  Lives in an independent setting with Valeri-long time girlfriend. He is able to independently perform ADL's at the best of his ability. Shares he is not as active as he should or wants to be. Spends most of his time sitting.  Discussed goals of care-at this time he would only want mechanical intubation as a last resort option, would not want tracheostomy. Discussed risks associated with mechanical ventilation and with underlying COPD and advanced age risk of unsuccessful wean from ventilator.  Encouraged Mr. Chestnut to consider DNR/DNI status understanding evidenced based poor outcomes in similar hospitalized patients, as the cause of the arrest is likely associated with chronic/terminal disease rather than a reversible acute cardio-pulmonary event.    During conversations, Cody Bridges becomes tearful. Requests to continue this conversations with his daughter and/or Cody Bridges present.  Cody Bridges is left with decisions surrounding ongoing plan of care given severity of COPD exacerbation with ongoing need for increased levels of oxygen support.  I discussed importance of continued conversations with family/support persons and all members of their medical team regarding overall plan of care and treatment options ensuring decisions are in alignment with patients goals of care.  Called and spoke with Cody Bridges-daughter, plan to meet at bedside around 11AM tomorrow morning.  All questions/concerns addressed. Emotional support provided to patient/family/support persons. PMT will continue to follow and support patient  as needed.  Objective: Primary Diagnoses: Present on  Admission:  COPD exacerbation  Hypertension  BPH (benign prostatic hyperplasia)  Acute respiratory failure with hypoxia  HLD (hyperlipidemia)  Chronic diastolic CHF (congestive heart failure)  Asthma exacerbation   Physical Exam Constitutional:      General: He is not in acute distress.    Appearance: He is ill-appearing.  Pulmonary:     Effort: Tachypnea and accessory muscle usage present.  Skin:    General: Skin is warm and dry.  Neurological:     Mental Status: He is alert.     Motor: Weakness present.     Vital Signs: BP 129/73 (BP Location: Left Arm)   Pulse 74   Temp 98.4 F (36.9 C) (Oral)   Resp (!) 23   Wt 89.7 kg   SpO2 97%   BMI 33.94 kg/m  Pain Scale: 0-10   Pain Score: 0-No pain   LBM: Last BM Date : 11/01/22 Baseline Weight: Weight: 93 kg Most recent weight: Weight: 89.7 kg      Assessment and Plan  SUMMARY OF RECOMMENDATIONS   Full Code-Full Scope Ongoing GOC conversations needed Planned meeting with Cody Bridges-daughter 4/6 at 11AM PMT to continue to follow for ongoing needs and support  Thank you for this consult and allowing Palliative Medicine to participate in the care of Cody Bridges. Palliative medicine will continue to follow and assist as needed.   Time Total: 75 minutes  Time spent includes: Detailed review of medical records (labs, imaging, vital signs), medically appropriate exam (mental status, respiratory, cardiac, skin), discussed with treatment team, counseling and educating patient, family and staff, documenting clinical information, medication management and coordination of care.   Signed by: Leeanne Deed, DNP, AGNP-C Palliative Medicine    Please contact Palliative Medicine Team phone at [phone] for questions and concerns.  For individual provider: See Loretha Stapler

## 2022-11-05 DIAGNOSIS — J441 Chronic obstructive pulmonary disease with (acute) exacerbation: Secondary | ICD-10-CM | POA: Diagnosis not present

## 2022-11-05 DIAGNOSIS — Z7189 Other specified counseling: Secondary | ICD-10-CM | POA: Diagnosis not present

## 2022-11-05 DIAGNOSIS — J9601 Acute respiratory failure with hypoxia: Secondary | ICD-10-CM | POA: Diagnosis not present

## 2022-11-05 DIAGNOSIS — Z515 Encounter for palliative care: Secondary | ICD-10-CM | POA: Diagnosis not present

## 2022-11-05 LAB — GLUCOSE, CAPILLARY
Glucose-Capillary: 195 mg/dL — ABNORMAL HIGH (ref 70–99)
Glucose-Capillary: 184 mg/dL — ABNORMAL HIGH (ref 70–99)
Glucose-Capillary: 223 mg/dL — ABNORMAL HIGH (ref 70–99)
Glucose-Capillary: 192 mg/dL — ABNORMAL HIGH (ref 70–99)

## 2022-11-05 MED ORDER — MONTELUKAST SODIUM 10 MG PO TABS
10.0000 mg | ORAL_TABLET | Freq: Every day | ORAL | Status: DC
  Administered 2022-11-05 – 2022-11-07 (×3): 10 mg via ORAL
  Filled 2022-11-05 (×3): qty 1

## 2022-11-05 MED ORDER — METHYLPREDNISOLONE SODIUM SUCC 40 MG IJ SOLR
40.0000 mg | Freq: Every day | INTRAMUSCULAR | Status: DC
  Administered 2022-11-06: 40 mg via INTRAVENOUS
  Filled 2022-11-05: qty 1

## 2022-11-05 MED ORDER — IPRATROPIUM-ALBUTEROL 0.5-2.5 (3) MG/3ML IN SOLN
3.0000 mL | Freq: Three times a day (TID) | RESPIRATORY_TRACT | Status: DC
  Administered 2022-11-06 – 2022-11-08 (×7): 3 mL via RESPIRATORY_TRACT
  Filled 2022-11-05 (×7): qty 3

## 2022-11-05 MED ORDER — MOMETASONE FURO-FORMOTEROL FUM 100-5 MCG/ACT IN AERO
2.0000 | INHALATION_SPRAY | Freq: Two times a day (BID) | RESPIRATORY_TRACT | Status: DC
  Administered 2022-11-05 – 2022-11-08 (×6): 2 via RESPIRATORY_TRACT
  Filled 2022-11-05: qty 8.8

## 2022-11-05 NOTE — Progress Notes (Signed)
Daily Progress Note   Patient Name: Cody MedinRobert Lee Bridges       Date: 11/05/2022 DOB: 03/24/1933  Age: 89 y.o. MRN#: [id number] Attending Physician: Enedina FinnerPatel, Sona, MD Primary Care Physician: Glori LuisSonnenberg, Eric G, MD Admit Date: 11/02/2022  Reason for Consultation/Follow-up: Establishing goals of care  HPI/Brief Hospital Review:  89 y.o. male  with past medical history of COPD, HTN, HLD, T2DM, diastolic CHF, BPH and HOH admitted from [facility] (assuming independent living facility) on 11/02/2022 with increased shortness of breath.   Found to be hypoxic with ongoing increased work of breathing-intermittently placed on bi-pap in ED-successfully weaned off   Admitted for acute respiratory failure with hypoxia due to COPD exacerbation-remains on high flow nasal cannula    Palliative medicine was consulted for assisting with goals of care conversations.  Subjective: Extensive chart review has been completed prior to meeting patient including labs, vital signs, imaging, progress notes, orders, and available advanced directive documents from current and previous encounters.    Visited with Cody Bridges at his bedside. Awake and alert, reports feeling well today and sleeping well last night. Daughter-Cody Bridges present at bedside for previously scheduled meeting, Cody Bridges also present for meeting.  Provided medical updates and reviewed current medical condition. Discussed expected diease trajectory associated with both COPD as well as CHF with each acute exacerbation unlikely to recover back to baseline prior to acute event. Cody ChessmanSuzanne expressed understanding.  We discussed Code Status-previous conversations had with Cody Bridges 4/5 but his request was to continue these conversations with Cody ChessmanSuzanne present.  Encouraged  patient/family to consider DNR/DNI status understanding evidenced based poor outcomes in similar hospitalized patients, as the cause of the arrest is likely associated with chronic/terminal disease rather than a reversible acute cardio-pulmonary event.  Cody Bridges verbalizes his understanding and wishes to transition to DNR.  We also discussed possible interventions in the event of further respiratory decompensation (not related to Code event). Cody Bridges agrees to short-term bipap use but declines intubation/mechanical ventilation. Cody ChessmanSuzanne shares her understanding and agreement.  Cody ChessmanSuzanne voices her concerns about her dad [facility] an [facility] living facility. Explained Cody Bridges will be evaluated by therapy services who will help guide discharge disposition.  I returned later in the day at the request of Cody Bridges when Cody PortsValerie was visiting-long time girlfriend.  Provided [facility] medical updates. OT working with Cody Bridges during this time. Cody Bridges able to sit on side of bed and stand briefly to adjust himself in bed. SpO2 remained stable, noted tachycardia and severe increased WOB. Coached deep breathing exercises and after resting respiratory status returned to baseline prior to working with OT.  PMT will continue to follow for ongoing needs and support.  Objective:  Physical Exam Constitutional:      General: He is not in acute distress.    Appearance: He is ill-appearing.  Cardiovascular:     Rate and Rhythm: Tachycardia present.  Pulmonary:     Effort: Respiratory distress present.  Skin:    General: Skin is warm and dry.  Neurological:     Mental Status: He is alert.     Motor: Weakness present.             Vital Signs: BP (!) 147/75 (BP Location: Left Arm)   Pulse 86   Temp 97.7 F (36.5 C)   Resp (!) 28   Wt 91.4 kg   SpO2 96%   BMI 34.59 kg/m  SpO2: SpO2: 96 % O2 Device: O2 Device: High Flow Nasal Cannula O2 Flow Rate: O2 Flow Rate (L/min): (S) 8  L/min   Palliative Care Assessment & Plan   Assessment/Recommendation/Plan  DNR/DNI-order placed, original document placed in chart, copy sent to be uploaded in EMR No intubation in the event of respiratory decompensation, agrees to short trial of bipap PMT to continue to follow for ongoing needs and support  Care plan was discussed with primary team and nursing staff.  Thank you for allowing the Palliative Medicine Team to assist in the care of this patient.  Total time:  50 minutes  Time spent includes: Detailed review of medical records (labs, imaging, vital signs), medically appropriate exam (mental status, respiratory, cardiac, skin), discussed with treatment team, counseling and educating patient, family and staff, documenting clinical information, medication management and coordination of care.  Cody Deed, DNP, AGNP-C Palliative Medicine   Please contact Palliative Medicine Team phone at [phone] for questions and concerns.

## 2022-11-05 NOTE — Progress Notes (Signed)
OT Cancellation Note  Patient Details Name: Cody Bridges MRN: [id number] DOB: October 28, 1932   Cancelled Treatment:    Reason Eval/Treat Not Completed: Fatigue/lethargy limiting ability to participate. Pt stating there is a goals of care meeting at 11am (which medical record corroborates), and desires for OT to come back later. However, pt is very non-committal about whether he will attempt to mobilize with OT at that point.  Will attempt later today or tomorrow.   [name] 11/05/2022, 10:44 AM

## 2022-11-05 NOTE — Plan of Care (Signed)
  Problem: Coping: Goal: Ability to adjust to condition or change in health will improve Outcome: Progressing   Problem: Fluid Volume: Goal: Ability to maintain a balanced intake and output will improve Outcome: Progressing   Problem: Metabolic: Goal: Ability to maintain appropriate glucose levels will improve Outcome: Progressing   Problem: Nutritional: Goal: Maintenance of adequate nutrition will improve Outcome: Progressing Goal: Progress toward achieving an optimal weight will improve Outcome: Progressing   Problem: Skin Integrity: Goal: Risk for impaired skin integrity will decrease Outcome: Progressing   Problem: Tissue Perfusion: Goal: Adequacy of tissue perfusion will improve Outcome: Progressing   Problem: Activity: Goal: Ability to tolerate increased activity will improve Outcome: Progressing Goal: Will verbalize the importance of balancing activity with adequate rest periods Outcome: Progressing   Problem: Respiratory: Goal: Ability to maintain a clear airway will improve Outcome: Progressing Goal: Levels of oxygenation will improve Outcome: Progressing Goal: Ability to maintain adequate ventilation will improve Outcome: Progressing   Problem: Clinical Measurements: Goal: Ability to maintain clinical measurements within normal limits will improve Outcome: Progressing Goal: Will remain free from infection Outcome: Progressing Goal: Diagnostic test results will improve Outcome: Progressing Goal: Respiratory complications will improve Outcome: Progressing Goal: Cardiovascular complication will be avoided Outcome: Progressing   Problem: Activity: Goal: Risk for activity intolerance will decrease Outcome: Progressing   Problem: Nutrition: Goal: Adequate nutrition will be maintained Outcome: Progressing   Problem: Coping: Goal: Level of anxiety will decrease Outcome: Progressing   Problem: Elimination: Goal: Will not experience complications  related to bowel motility Outcome: Progressing Goal: Will not experience complications related to urinary retention Outcome: Progressing   Problem: Pain Managment: Goal: General experience of comfort will improve Outcome: Progressing   Problem: Safety: Goal: Ability to remain free from injury will improve Outcome: Progressing   Problem: Skin Integrity: Goal: Risk for impaired skin integrity will decrease Outcome: Progressing   Problem: Education: Goal: Ability to describe self-care measures that may prevent or decrease complications (Diabetes Survival Skills Education) will improve Outcome: Not Progressing Note: Patient experiencing confusion. Will continue to educate and reorient as needed.   Problem: Health Behavior/Discharge Planning: Goal: Ability to identify and utilize available resources and services will improve Outcome: Not Progressing Note: Patient experiencing confusion. Will continue to educate and reorient as needed. Goal: Ability to manage health-related needs will improve Outcome: Not Progressing Note: Patient experiencing confusion. Will continue to educate and reorient as needed.   Problem: Education: Goal: Knowledge of disease or condition will improve Outcome: Not Progressing Note: Patient experiencing confusion. Will continue to educate and reorient as needed. Goal: Knowledge of the prescribed therapeutic regimen will improve Outcome: Not Progressing Note: Patient experiencing confusion. Will continue to educate and reorient as needed. Goal: Individualized Educational Video(s) Outcome: Not Progressing Note: Patient experiencing confusion. Will continue to educate and reorient as needed.   Problem: Education: Goal: Knowledge of General Education information will improve Description: Including pain rating scale, medication(s)/side effects and non-pharmacologic comfort measures Outcome: Not Progressing Note: Patient experiencing confusion. Will continue  to educate and reorient as needed.   Problem: Health Behavior/Discharge Planning: Goal: Ability to manage health-related needs will improve Outcome: Not Progressing Note: Patient experiencing confusion. Will continue to educate and reorient as needed.

## 2022-11-05 NOTE — Progress Notes (Signed)
Patient tachypneic and wheezy; just completed neb tx. New orders just placed by pulmonology. Will continue to monitor and treat as appropriate. See MAR and order history.    11/05/22 2031  Assess: MEWS Score  Temp 97.7 F (36.5 C)  BP (!) 146/94  MAP (mmHg) 109  Pulse Rate 99  ECG Heart Rate (!) 107  Resp (!) 28  SpO2 100 %  O2 Device HFNC  Patient Activity (if Appropriate) In bed  Assess: MEWS Score  MEWS Temp 0  MEWS Systolic 0  MEWS Pulse 1  MEWS RR 2  MEWS LOC 0  MEWS Score 3  MEWS Score Color Yellow  Assess: if the MEWS score is Yellow or Red  Were vital signs taken at a resting state? Yes  Focused Assessment No change from prior assessment  Does the patient meet 2 or more of the SIRS criteria? Yes  Does the patient have a confirmed or suspected source of infection? Yes  Provider and Rapid Response Notified? No  MEWS guidelines implemented  No, previously yellow, continue vital signs every 4 hours  Notify: Charge Nurse/RN  Name of Charge Nurse/RN Notified Animator  Assess: SIRS CRITERIA  SIRS Temperature  0  SIRS Pulse 1  SIRS Respirations  1  SIRS WBC 0  SIRS Score Sum  2

## 2022-11-05 NOTE — Progress Notes (Signed)
[facility]  - [place] [facility]   PATIENT NAME: Cody Bridges    MR#:  [id number]  DATE OF BIRTH:  1933-01-26  SUBJECTIVE:  Cody Bridges and Cody Bridges at bedside.   Pt came in with increasing shortness of breath and diagnosed with COPD flare. Quite week and deconditioned. Currently looks and feels better on  HFNC--sats96-100% on 40 liters of oxygen   VITALS:  Blood pressure (!) 140/70, pulse 95, temperature 97.6 F (36.4 C), resp. rate 18, weight 91.4 kg, SpO2 97 %.  PHYSICAL EXAMINATION:   GENERAL:  89 y.o.-year-old patient with no acute distress. weak, deconditioned LUNGS:decreased breath sounds bilaterally, no wheezing CARDIOVASCULAR: S1, S2 normal. No murmur   ABDOMEN: Soft, nontender, nondistended.  EXTREMITIES: No  edema b/l.    NEUROLOGIC: nonfocal  patient is alert and awake  LABORATORY PANEL:  CBC Recent Labs  Lab 11/03/22 0650  WBC 12.3*  HGB 11.8*  HCT 37.6*  PLT 310     Chemistries  Recent Labs  Lab 11/03/22 0650  NA 139  K 3.6  CL 99  CO2 30  GLUCOSE 186*  BUN 33*  CREATININE 1.10  CALCIUM 9.1    Cardiac Enzymes No results for input(s): "TROPONINI" in the last 168 hours. RADIOLOGY:  No results found.  Assessment and Plan Piere Sumsion Belitz is a 89 y.o. male with medical history significant of COPD/asthma, hypertension, hyperlipidemia, diabetes mellitus, diastolic CHF, GERD, BPH, hard of hearing, who presents with SOB.  Patient is normally not using oxygen, but was found to have oxygen saturation 89% on room air. Initially started on 4 L oxygen, but pt still has severe respiratory distress, cannot speak in full sentence, using accessory muscle for breathing.  BiPAP is started in ED.   Currently off bipap  Acute respiratory failure with hypoxia due to COPD/asthma exacerbation/Emphysema: Patient has severe respiratory distress, requiring BiPAP.  Chest x-ray negative. Pt is using anti-IL 4 receptor antibody, Dupilumab at home.   -BiPAP prn -Bronchodilators -Solu-Medrol 40 mg IV qd -Azithromycin (500 mg by IV, then 250 mg orally) -Mucinex for cough  -Incentive spirometry -Nasal cannula oxygen as needed to maintain O2 saturation 93% or greater when  -- drops sats easily with minimal exertion. Placed on/ high flow nasal cannula oxygen--wean down when able to -Pulmonary consult with Dr Meredeth Ide   Hypertension -IV hydralazine as needed -Amlodipine, HCTZ   Chronic diastolic CHF (congestive heart failure): 2D echo on 10/18/2021 showed EF of 55% with grade 1 diastolic dysfunction.  Patient has trace leg edema, no JVD.  No pulmonary edema by chest x-ray.  CHF seem to be compensated.  HLD (hyperlipidemia) -Crestor   Diabetes mellitus without complication: Recent A1c 6.9, well-controlled.  Patient taking metformin -SSI   BPH (benign prostatic hyperplasia) -Flomax, Proscar   Appreciate palliative care see pt to discuss GOC--pt is DNR/DNI Spoke with Cody Bridges at bedside     DVT ppx:  SQ Lovenox   Code Status:DNR   Family Communication: Cody Bridges at bedside   Disposition Plan:  TBD  Consults called:  Palliative ,pulmonary Level of care: Progressive Status is: Inpatient Remains inpatient appropriate because: COPD flare PT/OT to see pt  Weaning HFNC  TOTAL TIME TAKING CARE OF THIS PATIENT: 35 minutes.  >50% time spent on counselling and coordination of care  Note: This dictation was prepared with Dragon dictation along with smaller phrase technology. Any transcriptional errors that result from this process are unintentional.  Enedina Finner M.D    [facility]   CC: Primary care physician; Glori Luis, MD

## 2022-11-05 NOTE — TOC Progression Note (Addendum)
Transition of Care [facility]) - Progression Note    Patient Details  Name: Cody Bridges MRN: [id number] Date of Birth: 05/12/1933  Transition of [facility]) CM/SW Contact  Bing Quarry, RN Phone Number: 11/05/2022, 3:58 PM  Clinical Narrative: 4/6: Spoke with daughter at length regarding situation after palliative consult and speaking with provider. Patient was a [facility] [place] with long term significant other, but not married. Daughter will obtain HCPOA per discussion with palliative and patient. Is interested in exploring STR/SNF transition to LTC most likely as significant other is unable to understand medical issues/differences between the two. Patient very sensitive to what will happen to significant other if he enters a SNF or LTC. [facility] is first choice but was directed to Medicare.gov website to explore local options, definitely wants to stay  close to [place], [place]. Will explore other choices as well. CSW referral to [facility] with updated PT notes. Gabriel Cirri RN CM          Expected Discharge Plan and Services                                               Social Determinants of Health (SDOH) Interventions SDOH Screenings   Food Insecurity: No Food Insecurity (11/03/2022)  Housing: Low Risk  (11/03/2022)  Transportation Needs: No Transportation Needs (11/03/2022)  Utilities: Not At Risk (11/03/2022)  Depression (PHQ2-9): Low Risk  (06/13/2022)  Tobacco Use: Medium Risk (11/02/2022)    Readmission Risk Interventions     No data to display

## 2022-11-05 NOTE — Consult Note (Signed)
Pulmonary Medicine          Date: 11/05/2022,   MRN# [id number] Cody Bridges [birth year]     AdmissionWeight: 93 kg                 CurrentWeight: 91.4 kg      CHIEF COMPLAINT:   Resp failure   HISTORY OF PRESENT ILLNESS   This is an 89 y.o. male  with past medical history of COPD/asthma, HTN, HLD, T2DM, diastolic CHF, BPH and HOH, resident at [facility], came in with hypoxia,  diff breathing.  He was placed on bipap initially. Presently on high flow ( 40 %). On talking with him, h is awre of poor lung function, quality of life is going to poor. He does not want to be intubated, short trial of bipap yes.   He is on azithro, solumedrol 40 mg q day, mucinex, incentive spiro and bronchodilators   ricuspid: TRIVIAL TR                 No TS                         241.0 cm/sec peak TR vel   26.2 mmHg peak RV pressure              Pulmonary: TRIVIAL PR                 No PS                         111.0 cm/sec peak vel      4.9 mmHg peak grad  _________________________________________________________________________________________  INTERPRETATION  NORMAL LEFT VENTRICULAR SYSTOLIC FUNCTION   WITH MILD LVH  NORMAL RIGHT VENTRICULAR SYSTOLIC FUNCTION  TRIVIAL REGURGITATION NOTED (See above)  NO VALVULAR STENOSIS  TRIVIAL MR, TR, PR  EF >55%  _________________________________________________________________________________________  Electronically signed by      MD Arnoldo Hooker on 10/19/2021 09: 59 AM           Performed By: Merla Riches, RDCS     Ordering Physician: Arnoldo Hooker   TID Ratio: 1.08  LVEF= 51%  FINDINGS: Regional wall motion:  reveals normal myocardial thickening and wall motion. The overall quality of the study is good.   Artifacts noted: no Left ventricular cavity: normal.  Perfusion Analysis:  SPECT images demonstrate homogeneous tracer distribution throughout the myocardium. Defect type : Normal   Exam End: 10/05/21 15:15    4/21: FVC was 2.64 liters, 78% of predicted/Post 2.77, 82%, 5% Change FEV1 was 1.53, 59% of predicted/Post 1.71, 66%, 12% Change FEV1 ratio was 58/Post 62 FEF 25-75% liters per second was 22% of predicted/Post 28%, 26% Change *Patient took Personal Albuterol Inhaler for Post Spirometry  LUNG VOLUMES: TLC was 87% of predicted RV was 94% of predicted  DIFFUSION CAPACITY: DLCO was 99% of predicted DLCO/VA was 126% of predicted  FLOW VOLUME LOOP: C/w obstruction  Impression Spirometry is c/w moderate obstruction, positive for bronchodilator effect Lung volumes are in normal range Diffusion capacity is in normal range   PAST MEDICAL HISTORY   Past Medical History:  Diagnosis Date   Actinic keratosis    Arthritis    Asthma    COPD (chronic obstructive pulmonary disease)    COVID-19 02/13/2021   Diabetes mellitus without complication     2015   Dyspnea    with exertion   Edema  GERD (gastroesophageal reflux disease)    HOH (hard of hearing)    AIDS   Hyperlipidemia    Hypertension    Wheezing      SURGICAL HISTORY   Past Surgical History:  Procedure Laterality Date   BUNIONECTOMY Right    CATARACT EXTRACTION W/PHACO Left 08/22/2017   Procedure: CATARACT EXTRACTION PHACO AND INTRAOCULAR LENS PLACEMENT (IOC);  Surgeon: Galen Manila, MD;  Location: [facility] ORS;  Service: Ophthalmology;  Laterality: Left;  [place] 00:43.7 AP% 12.3 CDE 5.38 Fluid pack lot # [id number]   COLONOSCOPY  2016   ELECTROMAGNETIC NAVIGATION BROCHOSCOPY N/A 02/09/2017   Procedure: ELECTROMAGNETIC NAVIGATION BRONCHOSCOPY;  Surgeon: Erin Fulling, MD;  Location: [facility] ORS;  Service: Cardiopulmonary;  Laterality: N/A;   EYE SURGERY Right    Cataract Extraction with IOL   HAMMER TOE SURGERY Right    TONSILLECTOMY  1938     FAMILY HISTORY   Family History  Problem Relation Age of Onset   Cancer Mother    Stomach cancer Mother    Stomach cancer Sister      SOCIAL HISTORY   Social  History   Tobacco Use   Smoking status: Former    Packs/day: 2.00    Years: 35.00    Additional pack years: 0.00    Total pack years: 70.00    Types: Cigarettes    Quit date: 10/30/1985    Years since quitting: 37.0   Smokeless tobacco: Never  Vaping Use   Vaping Use: Never used  Substance Use Topics   Alcohol use: Yes    Comment: occasional   Drug use: No     MEDICATIONS    Home Medication:    Current Medication:  Current Facility-Administered Medications:    acetaminophen (TYLENOL) tablet 650 mg, 650 mg, Oral, Q6H PRN, Lorretta Harp, MD, 650 mg at 11/05/22 1448   albuterol (PROVENTIL) (2.5 MG/3ML) 0.083% nebulizer solution 2.5 mg, 2.5 mg, Nebulization, Q4H PRN, Lorretta Harp, MD, 2.5 mg at 11/04/22 1013   amLODipine (NORVASC) tablet 5 mg, 5 mg, Oral, Daily, Lorretta Harp, MD, 5 mg at 11/05/22 0828   azelastine (ASTELIN) 0.1 % nasal spray 1 spray, 1 spray, Each Nare, Q1400, Lorretta Harp, MD, 1 spray at 11/05/22 1258   azithromycin (ZITHROMAX) tablet 250 mg, 250 mg, Oral, Daily, Ronnald Ramp, RPH, 250 mg at 11/05/22 4540   calcium carbonate (TUMS - dosed in mg elemental calcium) chewable tablet 200-400 mg of elemental calcium, 1-2 tablet, Oral, TID PRN, Lorretta Harp, MD   cholecalciferol (VITAMIN D3) 25 MCG (1000 UNIT) tablet 5,000 Units, 5,000 Units, Oral, Daily, Lorretta Harp, MD, 5,000 Units at 11/05/22 9811   dextromethorphan-guaiFENesin (MUCINEX DM) 30-600 MG per 12 hr tablet 1 tablet, 1 tablet, Oral, BID PRN, Lorretta Harp, MD, 1 tablet at 11/03/22 0055   docusate sodium (COLACE) capsule 100 mg, 100 mg, Oral, BID PRN, Enedina Finner, MD   enoxaparin (LOVENOX) injection 45 mg, 0.5 mg/kg, Subcutaneous, Q24H, Ronnald Ramp, RPH, 45 mg at 11/04/22 2143   finasteride (PROSCAR) tablet 5 mg, 5 mg, Oral, Daily, Lorretta Harp, MD, 5 mg at 11/05/22 9147   hydrALAZINE (APRESOLINE) injection 5 mg, 5 mg, Intravenous, Q2H PRN, Lorretta Harp, MD   hydrochlorothiazide (HYDRODIURIL) tablet 25 mg, 25 mg, Oral,  Daily, Enedina Finner, MD, 25 mg at 11/05/22 0828   insulin aspart (novoLOG) injection 0-5 Units, 0-5 Units, Subcutaneous, QHS, Lorretta Harp, MD, 0 Units at 11/02/22 2219   insulin aspart (novoLOG) injection 0-9 Units, 0-9 Units, Subcutaneous,  TID WC, Lorretta HarpNiu, Xilin, MD, 3 Units at 11/05/22 1638   ipratropium-albuterol (DUONEB) 0.5-2.5 (3) MG/3ML nebulizer solution 3 mL, 3 mL, Nebulization, Q4H, Enedina FinnerPatel, Sona, MD, 3 mL at 11/05/22 1610   latanoprost (XALATAN) 0.005 % ophthalmic solution 1 drop, 1 drop, Left Eye, QHS, Enedina FinnerPatel, Sona, MD, 1 drop at 11/04/22 2143   [START ON 11/06/2022] methylPREDNISolone sodium succinate (SOLU-MEDROL) 40 mg/mL injection 40 mg, 40 mg, Intravenous, Daily, Enedina FinnerPatel, Sona, MD   multivitamin with minerals tablet 1 tablet, 1 tablet, Oral, Daily, Lorretta HarpNiu, Xilin, MD, 1 tablet at 11/05/22 0829   ondansetron (ZOFRAN) injection 4 mg, 4 mg, Intravenous, Q8H PRN, Lorretta HarpNiu, Xilin, MD   polyethylene glycol (MIRALAX / GLYCOLAX) packet 17 g, 17 g, Oral, Daily, Enedina FinnerPatel, Sona, MD, 17 g at 11/05/22 0827   polyvinyl alcohol (LIQUIFILM TEARS) 1.4 % ophthalmic solution 1 drop, 1 drop, Both Eyes, TID PRN, Lorretta HarpNiu, Xilin, MD   rosuvastatin (CRESTOR) tablet 20 mg, 20 mg, Oral, Daily, Lorretta HarpNiu, Xilin, MD, 20 mg at 11/05/22 [id number]   tamsulosin (FLOMAX) capsule 0.4 mg, 0.4 mg, Oral, Daily, Lorretta HarpNiu, Xilin, MD, 0.4 mg at 11/05/22 [id number]    ALLERGIES   Clindamycin, Lisinopril, and Penicillin v potassium     REVIEW OF SYSTEMS    Review of Systems:  Gen:  Denies  fever, sweats, chills weigh loss  HEENT: Denies blurred vision, double vision, ear pain, eye pain, hearing loss, nose bleeds, sore throat Cardiac:  No dizziness, chest pain or heaviness, chest tightness,edema Resp:   Denies cough or sputum porduction, ++ shortness of breath, +wheezing,- hemoptysis,  Gi: Denies swallowing difficulty, stomach pain, nausea or vomiting, diarrhea, constipation, bowel incontinence Gu:  Denies bladder incontinence, burning urine Ext:   Denies  Joint pain, stiffness or swelling Skin: Denies  skin rash, easy bruising or bleeding or hives Endoc:  Denies polyuria, polydipsia , polyphagia or weight change Psych:   Denies depression, insomnia or hallucinations   Other:  All other systems negative   VS: BP (!) 147/75 (BP Location: Left Arm)   Pulse 86   Temp 97.7 F (36.5 C)   Resp (!) 28   Wt 91.4 kg   SpO2 96%   BMI 34.59 kg/m    SpO2: SpO2: 96 % O2 Device: O2 Device: High Flow Nasal Cannula O2 Flow Rate: O2 Flow Rate (L/min): (S) 8 L/min  PHYSICAL EXAM    GENERAL:sitting semi erect, no fevers, chills, no weakness no fatigue HEAD: Normocephalic, atraumatic.  EYES: Pupils equal, round, reactive to light. Extraocular muscles intact. No scleral icterus.  MOUTH: Moist mucosal membrane. Dentition intact. No abscess noted.  EAR, NOSE, THROAT: Clear without exudates. No external lesions.  NECK: Supple. No thyromegaly. No nodules. No JVD. No stridor PULMONARY: mild use of accessory muscle during speech/ visit. Rare wheeze. No consolidation CARDIOVASCULAR: S1 and S2. Regular rate and rhythm. No murmurs, rubs, or gallops. No edema. Pedal pulses 2+ bilaterally.  GASTROINTESTINAL: Soft, nontender, nondistended. No masses. Positive bowel sounds. No hepatosplenomegaly.  MUSCULOSKELETAL: No swelling, clubbing, or edema. Range of motion full in all extremities.  NEUROLOGIC: Cranial nerves II through XII are intact. No gross focal neurological deficits. Sensation intact. Reflexes intact.  SKIN: No ulceration, lesions, rashes, or cyanosis. Skin warm and dry. Turgor intact.  PSYCHIATRIC: Mood, affect within normal limits. The patient is awake, alert and oriented x 3. Insight, judgment intact.       IMAGING    DG Chest 2 View  Result Date: 11/02/2022 CLINICAL DATA:  Shortness of breath EXAM:  CHEST - 2 VIEW COMPARISON:  02/13/2021 FINDINGS: Mild cardiomegaly and aortic tortuosity. There is no edema, consolidation, effusion, or  pneumothorax. IMPRESSION: No evidence of active disease. Electronically Signed   By: Tiburcio PeaJonathan  Watts M.D.   On: 11/02/2022 09:40      ASSESSMENT/PLAN   Asthma/copd, flare, moderate obstruction on last pfts. Had been on trelegy , albuterol and dupixent ( primarily for his skin condition). He was lost to follow up since 2022. Present in resp failure. He seem to think he is better today compared to yesterday..Slowly responding on broad coverage. Overall guarded -poor prognosis  -will stay on present regimen -add singulair 10 mg qhs -add laba/ics -Incentive spiro -weaning fio2 as tolerate -continue ot, pt -following palliative recs -       Thank you for allowing me to participate in the care of this patient.   Patient/Family are satisfied with care plan and all questions have been answered.  This document was prepared using Dragon voice recognition software and may include unintentional dictation errors.     Ned ClinesHerbon Shashana Fullington, M.D.  Division of Pulmonary & Critical Care Medicine  [facility]

## 2022-11-05 NOTE — Evaluation (Signed)
Occupational Therapy Evaluation Patient Details Name: Cody Bridges MRN: [id number] DOB: 1933/06/26 Today's Date: 11/05/2022   History of Present Illness Pt is an 13 male that presented to ED for SOB, placed on bipap. PMH of COPD/asthma, hypertension, hyperlipidemia, diabetes mellitus, diastolic CHF, GERD, BPH, hard of hearing.   Clinical Impression   Patient received for OT evaluation. See flowsheet below for details of function. Generally, patient requiring MOD A for bed mobility, HHA MOD A x2 for functional mobility, and set up-MAX A for ADLs. Pt on 31L O2 today (38% FiO2). Increased work of breathing with any activity, although O2 sats remaining stable. Patient will benefit from continued OT while in acute care.       Recommendations for follow up therapy are one component of a multi-disciplinary discharge planning process, led by the attending physician.  Recommendations may be updated based on patient status, additional functional criteria and insurance authorization.   Assistance Recommended at Discharge Frequent or constant Supervision/Assistance  Patient can return home with the following Two people to help with walking and/or transfers;A lot of help with bathing/dressing/bathroom;Assistance with cooking/housework;Direct supervision/assist for medications management;Direct supervision/assist for financial management;Assist for transportation;Help with stairs or ramp for entrance    Functional Status Assessment  Patient has had a recent decline in their functional status and demonstrates the ability to make significant improvements in function in a reasonable and predictable amount of time.  Equipment Recommendations  Other (comment) (defer to next venue of care)    Recommendations for Other Services       Precautions / Restrictions Precautions Precautions: Fall Restrictions Weight Bearing Restrictions: No      Mobility Bed Mobility Overal bed mobility: Needs  Assistance Bed Mobility: Supine to Sit     Supine to sit: HOB elevated, Min assist     General bed mobility comments: Decreased activity tolerance    Transfers Overall transfer level: Needs assistance Equipment used: 2 person hand held assist Transfers: Sit to/from Stand Sit to Stand: Mod assist, +2 physical assistance           General transfer comment: Pt able to stand with handheld assist of OT and NP. Pt stood for a few moments and took some time to understand and follow OT instructions fo rsidestepping; tried to reach out for cabinet with L hand and OT instructed pt to take NP's hand instead. With HHAx2 pt sidestepped to the R approx 3 feet towards [facility] with great effort. HR in 150's with that activity. O2 remained stable 95-97%.      Balance Overall balance assessment: Needs assistance Sitting-balance support: Feet supported Sitting balance-Leahy Scale: Good     Standing balance support: Bilateral upper extremity supported Standing balance-Leahy Scale: Poor                             ADL either performed or assessed with clinical judgement   ADL Overall ADL's : Needs assistance/impaired                                       General ADL Comments: Pt unable to participate in ADLs today given his decreased activity tolerance. Did have some food at bedside and was able to use BIL UE functionally during session, so anticipate that pt able to adequately participate in bed-level ADLs such as grooming and eating with set up, but anticipate  pt to need MAX to total assistance with LB dressing, UB dressing, bathing, and toileting, as unable to tolerate out of bed activity for more than a few minutes today.     Vision         Perception     Praxis      Pertinent Vitals/Pain Pain Assessment Pain Assessment: No/denies pain     Hand Dominance     Extremity/Trunk Assessment Upper Extremity Assessment Upper Extremity Assessment: Generalized  weakness   Lower Extremity Assessment Lower Extremity Assessment: Generalized weakness   Cervical / Trunk Assessment Cervical / Trunk Assessment: Kyphotic (unclear if this was structural or because of pt's poor breathing today)   Communication Communication Communication: HOH   Cognition Arousal/Alertness: Awake/alert Behavior During Therapy: WFL for tasks assessed/performed Overall Cognitive Status: Within Functional Limits for tasks assessed                                 General Comments: Pt requiring encouragement to participate; pt declined OT twice, and was about to decline again except that palliative NP was in the room and reminded pt of his goal to get back home and that he needs to work with therapy and at least see what he can do; pt then agreeable.     General Comments  Pt on 38% FiO2 at 31L oxygen throughout session today; O2 97% at rest, although pt appears to have increased work of breathing. O2 remained stable with activity. HR 115 at rest, 150's with standing/sidesteps.  RN aware.    Exercises     Shoulder Instructions      Home Living Family/patient expects to be discharged to:: Private residence [facility]) Living Arrangements: Spouse/significant other Available Help at Discharge: Family Type of Home: Apartment Home Access: Level entry     Home Layout: One level     Bathroom Shower/Tub: Chief Strategy Officer: Standard Bathroom Accessibility: Yes How Accessible: Accessible via walker Home Equipment: Rollator (4 wheels);Cane - single point;Wheelchair - manual   Additional Comments: Lives at [facility]      Prior Functioning/Environment Prior Level of Function : Needs assist       Physical Assist : Mobility (physical);ADLs (physical)   ADLs (physical): IADLs;Bathing Mobility Comments: Pt uses rollator typically; admits to not mobilizing much; spends most of the day in recliner; denies falls. ADLs Comments: Pt  states that he does ADLs independently although girlfriend assists with shower transfer; however, corroboration with palliative care NP reveals that pt admits to not having showered "in a while" and sponge bathing more recently.  States that his girlfriend manages medications. States that he is not on O2 at home. Is retired [employer] park manager.        OT Problem List: Decreased activity tolerance;Impaired balance (sitting and/or standing)      OT Treatment/Interventions: Self-care/ADL training;Therapeutic exercise;Therapeutic activities    OT Goals(Current goals can be found in the care plan section) Acute Rehab OT Goals Patient Stated Goal: Get better OT Goal Formulation: With patient Time For Goal Achievement: 11/19/22 Potential to Achieve Goals: Fair ADL Goals Pt Will Perform Grooming: with modified independence;standing Pt Will Perform Lower Body Dressing: with modified independence;sit to/from stand Pt Will Transfer to Toilet: with modified independence;bedside commode Pt Will Perform Toileting - Clothing Manipulation and hygiene: with modified independence;sit to/from stand  OT Frequency: Min 1X/week    Co-evaluation  AM-PAC OT "6 Clicks" Daily Activity     Outcome Measure Help from another person eating meals?: None Help from another person taking care of personal grooming?: A Little Help from another person toileting, which includes using toliet, bedpan, or urinal?: Total Help from another person bathing (including washing, rinsing, drying)?: A Lot Help from another person to put on and taking off regular upper body clothing?: A Little Help from another person to put on and taking off regular lower body clothing?: Total 6 Click Score: 14   End of Session Equipment Utilized During Treatment: Oxygen Nurse Communication: Mobility status  Activity Tolerance: Patient limited by fatigue;Patient limited by lethargy Patient left: in bed;with bed alarm set;with call  bell/phone within reach  OT Visit Diagnosis: Unsteadiness on feet (R26.81)                Time: [id number] OT Time Calculation (min): 20 min Charges:  OT General Charges $OT Visit: 1 Visit OT Evaluation $OT Eval Moderate Complexity: 1 Mod OT Treatments $Therapeutic Activity: 8-22 mins  Linward FosterLoy Anne Mayah Urquidi, MS, OTR/L  Alvester MorinLoy  Brinna Divelbiss 11/05/2022, 4:35 PM

## 2022-11-06 DIAGNOSIS — E1151 Type 2 diabetes mellitus with diabetic peripheral angiopathy without gangrene: Secondary | ICD-10-CM | POA: Diagnosis not present

## 2022-11-06 DIAGNOSIS — Z7189 Other specified counseling: Secondary | ICD-10-CM | POA: Diagnosis not present

## 2022-11-06 DIAGNOSIS — J9601 Acute respiratory failure with hypoxia: Secondary | ICD-10-CM | POA: Diagnosis not present

## 2022-11-06 DIAGNOSIS — J45909 Unspecified asthma, uncomplicated: Secondary | ICD-10-CM | POA: Diagnosis not present

## 2022-11-06 DIAGNOSIS — I5033 Acute on chronic diastolic (congestive) heart failure: Secondary | ICD-10-CM | POA: Diagnosis not present

## 2022-11-06 DIAGNOSIS — Z515 Encounter for palliative care: Secondary | ICD-10-CM | POA: Diagnosis not present

## 2022-11-06 DIAGNOSIS — R2681 Unsteadiness on feet: Secondary | ICD-10-CM | POA: Diagnosis not present

## 2022-11-06 DIAGNOSIS — J441 Chronic obstructive pulmonary disease with (acute) exacerbation: Secondary | ICD-10-CM | POA: Diagnosis not present

## 2022-11-06 DIAGNOSIS — E46 Unspecified protein-calorie malnutrition: Secondary | ICD-10-CM | POA: Diagnosis not present

## 2022-11-06 DIAGNOSIS — N62 Hypertrophy of breast: Secondary | ICD-10-CM | POA: Diagnosis not present

## 2022-11-06 LAB — GLUCOSE, CAPILLARY
Glucose-Capillary: 118 mg/dL — ABNORMAL HIGH (ref 70–99)
Glucose-Capillary: 173 mg/dL — ABNORMAL HIGH (ref 70–99)
Glucose-Capillary: 261 mg/dL — ABNORMAL HIGH (ref 70–99)
Glucose-Capillary: 219 mg/dL — ABNORMAL HIGH (ref 70–99)

## 2022-11-06 MED ORDER — ENSURE ENLIVE PO LIQD
237.0000 mL | Freq: Two times a day (BID) | ORAL | Status: DC
  Administered 2022-11-06 – 2022-11-08 (×4): 237 mL via ORAL

## 2022-11-06 MED ORDER — PREDNISONE 20 MG PO TABS
40.0000 mg | ORAL_TABLET | Freq: Every day | ORAL | Status: DC
  Administered 2022-11-07: 40 mg via ORAL
  Filled 2022-11-06: qty 2

## 2022-11-06 MED ORDER — SALINE SPRAY 0.65 % NA SOLN
1.0000 | NASAL | Status: DC | PRN
  Administered 2022-11-06 – 2022-11-07 (×2): 1 via NASAL
  Filled 2022-11-06: qty 44

## 2022-11-06 NOTE — Progress Notes (Signed)
[facility]  - [place] at [facility]   PATIENT NAME: Cody Bridges    MR#:  [id number]  DATE OF BIRTH:  [birth year]  SUBJECTIVE:  No family at bedside Pt came in with increasing shortness of breath and diagnosed with COPD flare. Quite week and deconditioned. Pt on 8 liter [place]   VITALS:  Blood pressure (!) 157/97, pulse (!) 107, temperature 98 F (36.7 C), resp. rate 16, height 5\' 4"  (1.626 m), weight 91.6 kg, SpO2 (!) 89 %.  PHYSICAL EXAMINATION:   GENERAL:  89 y.o.-year-old patient with no acute distress. weak, deconditioned LUNGS:decreased breath sounds bilaterally, no wheezing CARDIOVASCULAR: S1, S2 normal. No murmur   ABDOMEN: Soft, nontender, nondistended.  EXTREMITIES: No  edema b/l.    NEUROLOGIC: nonfocal  patient is alert and awake  LABORATORY PANEL:  CBC Recent Labs  Lab 11/03/22 0650  WBC 12.3*  HGB 11.8*  HCT 37.6*  PLT 310     Chemistries  Recent Labs  Lab 11/03/22 0650  NA 139  K 3.6  CL 99  CO2 30  GLUCOSE 186*  BUN 33*  CREATININE 1.10  CALCIUM 9.1    Cardiac Enzymes No results for input(s): "TROPONINI" in the last 168 hours. RADIOLOGY:  No results found.  Assessment and Plan Cody Bridges is a 89 y.o. male with medical history significant of COPD/asthma, hypertension, hyperlipidemia, diabetes mellitus, diastolic CHF, GERD, BPH, hard of hearing, who presents with SOB.  Patient is normally not using oxygen, but was found to have oxygen saturation 89% on room air. Initially started on 4 L oxygen, but pt still has severe respiratory distress, cannot speak in full sentence, using accessory muscle for breathing.  BiPAP is started in ED.   Currently off bipap  Acute respiratory failure with hypoxia due to COPD/asthma exacerbation/Emphysema: Patient has severe respiratory distress, requiring BiPAP.  Chest x-ray negative. Pt is using anti-IL 4 receptor antibody, Dupilumab at home.  -BiPAP prn -Bronchodilators -Solu-Medrol 40 mg  IV qd--change to po prednisone -Azithromycin (500 mg by IV, then 250 mg orally) -Mucinex for cough  -Incentive spirometry -Nasal cannula oxygen as needed to maintain O2 saturation 93% or greater when  -- drops sats easily with minimal exertion. Placed on/ high flow nasal cannula oxygen--wean down when able to -Pulmonary consult with Dr Meredeth Ide   Hypertension -IV hydralazine as needed -Amlodipine, HCTZ   Chronic diastolic CHF (congestive heart failure): 2D echo on 10/18/2021 showed EF of 55% with grade 1 diastolic dysfunction.  Patient has trace leg edema, no JVD.  No pulmonary edema by chest x-ray.  CHF seem to be compensated.  HLD (hyperlipidemia) -Crestor   Diabetes mellitus without complication: Recent A1c 6.9, well-controlled.  Patient taking metformin -SSI   BPH (benign prostatic hyperplasia) -Flomax, Proscar   Appreciate palliative care see pt to discuss GOC--pt is DNR/DNI Spoke with dter at bedside Poor prognosis    DVT ppx:  SQ Lovenox   Code Status:DNR   Family Communication: Rosalita Chessman [place] at bedside   Disposition Plan:  TBD  Consults called:  Palliative ,pulmonary Level of care: Progressive Status is: Inpatient Remains inpatient appropriate because: COPD flare PT/OT to see pt  Weaning HFNC  TOTAL TIME TAKING CARE OF THIS PATIENT: 35 minutes.  >50% time spent on counselling and coordination of care  Note: This dictation was prepared with Dragon dictation along with smaller phrase technology. Any transcriptional errors that result from this process are unintentional.  Enedina Finner M.D    [facility]  CC: Primary care physician; Leone Haven, MD

## 2022-11-06 NOTE — TOC Progression Note (Signed)
Transition of Care [facility]) - Progression Note    Patient Details  Name: Cody Bridges MRN: [id number] Date of Birth: [birth year]  Transition of [facility]) CM/SW Contact  Bing Quarry, RN Phone Number: 11/06/2022, 1:21 PM  Clinical Narrative:  4/7: Shona Simpson is still pending a bed offer in the electronic hub. Gabriel Cirri RN CM           Expected Discharge Plan and Services                                               Social Determinants of Health (SDOH) Interventions SDOH Screenings   Food Insecurity: No Food Insecurity (11/03/2022)  Housing: Low Risk  (11/03/2022)  Transportation Needs: No Transportation Needs (11/03/2022)  Utilities: Not At Risk (11/03/2022)  Depression (PHQ2-9): Low Risk  (06/13/2022)  Tobacco Use: Medium Risk (11/02/2022)    Readmission Risk Interventions     No data to display

## 2022-11-06 NOTE — Progress Notes (Signed)
Daily Progress Note   Patient Name: Cody Bridges       Date: 11/06/2022 DOB: 09-Jun-1933  Age: 89 y.o. MRN#: [id number] Attending Physician: Enedina Finner, MD Primary Care Physician: Glori Luis, MD Admit Date: 11/02/2022  Reason for Consultation/Follow-up: Establishing goals of care  HPI/Brief Hospital Review:  89 y.o. male  with past medical history of COPD, HTN, HLD, T2DM, diastolic CHF, BPH and HOH admitted from [facility] (assuming independent living facility) on 11/02/2022 with increased shortness of breath.   Found to be hypoxic with ongoing increased work of breathing-intermittently placed on bi-pap in ED-successfully weaned off   Admitted for acute respiratory failure with hypoxia due to COPD exacerbation-remains on high flow nasal cannula    Palliative medicine was consulted for assisting with goals of care conversations.    Subjective: Extensive chart review has been completed prior to meeting patient including labs, vital signs, imaging, progress notes, orders, and available advanced directive documents from current and previous encounters.    Review of chart, episode of acute respiratory distress overnight-relieved with interventions.  Visited with Cody Bridges earlier this AM. Shares his anxiety as he feels his condition is worsening, becomes tearful. Empathetic silence and emotional support provided. Breakfast tray delivered during visit, Cody Bridges requested assistance with preparing his food. Shared I would follow-up later in the day.  Visited at bedside later in the day. Cody Bridges with improved spirits. Sitting up in chair and feeling well. Noted increased WOB stable compared to earlier visit. Shares he was able to visit with Vikki Ports and Rosalita Chessman again.  He shares that he has  thoughts about "giving up and hanging up my hat" but other times wants to continue to gain strength and be strong. Acknowledged his feelings and offered assurance. Shares his appreciation of the emotional support.  Shared I will not be on service again until Wednesday. Will follow-up then if remains in hospital. Will have provider from PMT check in tomorrow.  Objective:  Physical Exam Constitutional:      General: He is not in acute distress.    Appearance: He is ill-appearing.  Pulmonary:     Effort: Tachypnea and accessory muscle usage present.  Skin:    General: Skin is warm and dry.  Neurological:     Mental Status: He is alert.  Motor: Weakness present.             Vital Signs: BP (!) 157/97 (BP Location: Right Arm)   Pulse (!) 107   Temp 98 F (36.7 C)   Resp 16   Ht 5\' 4"  (1.626 m)   Wt 91.6 kg   SpO2 92%   BMI 34.66 kg/m  SpO2: SpO2: 92 % O2 Device: O2 Device: High Flow Nasal Cannula O2 Flow Rate: O2 Flow Rate (L/min): 8 L/min   Palliative Care Assessment & Plan   Assessment/Recommendation/Plan  DNR/DNI Ongoing GOC needed if respiratory status continues to decline-would recommend introducing comfort care/hospice, will need emotional support PMT to continue to follow for ongoing needs and support  Thank you for allowing the Palliative Medicine Team to assist in the care of this patient.  Total time:  35 minutes  Time spent includes: Detailed review of medical records (labs, imaging, vital signs), medically appropriate exam (mental status, respiratory, cardiac, skin), discussed with treatment team, counseling and educating patient, family and staff, documenting clinical information, medication management and coordination of care.  Leeanne Deed, DNP, AGNP-C Palliative Medicine   Please contact Palliative Medicine Team phone at [phone] for questions and concerns.

## 2022-11-06 NOTE — Plan of Care (Signed)
  Problem: Education: Goal: Ability to describe self-care measures that may prevent or decrease complications (Diabetes Survival Skills Education) will improve Outcome: Progressing Goal: Individualized Educational Video(s) Outcome: Progressing   Problem: Coping: Goal: Ability to adjust to condition or change in health will improve Outcome: Progressing   Problem: Fluid Volume: Goal: Ability to maintain a balanced intake and output will improve Outcome: Progressing   Problem: Health Behavior/Discharge Planning: Goal: Ability to identify and utilize available resources and services will improve Outcome: Progressing Goal: Ability to manage health-related needs will improve Outcome: Progressing   Problem: Metabolic: Goal: Ability to maintain appropriate glucose levels will improve Outcome: Progressing   Problem: Nutritional: Goal: Maintenance of adequate nutrition will improve Outcome: Progressing Goal: Progress toward achieving an optimal weight will improve Outcome: Progressing   Problem: Skin Integrity: Goal: Risk for impaired skin integrity will decrease Outcome: Progressing   Problem: Tissue Perfusion: Goal: Adequacy of tissue perfusion will improve Outcome: Progressing   Problem: Education: Goal: Knowledge of disease or condition will improve Outcome: Progressing Goal: Knowledge of the prescribed therapeutic regimen will improve Outcome: Progressing Goal: Individualized Educational Video(s) Outcome: Progressing   Problem: Activity: Goal: Ability to tolerate increased activity will improve Outcome: Progressing Goal: Will verbalize the importance of balancing activity with adequate rest periods Outcome: Progressing   Problem: Respiratory: Goal: Ability to maintain a clear airway will improve Outcome: Progressing Goal: Levels of oxygenation will improve Outcome: Progressing Goal: Ability to maintain adequate ventilation will improve Outcome: Progressing    Problem: Education: Goal: Knowledge of General Education information will improve Description: Including pain rating scale, medication(s)/side effects and non-pharmacologic comfort measures Outcome: Progressing   Problem: Health Behavior/Discharge Planning: Goal: Ability to manage health-related needs will improve Outcome: Progressing   Problem: Clinical Measurements: Goal: Ability to maintain clinical measurements within normal limits will improve Outcome: Progressing Goal: Will remain free from infection Outcome: Progressing Goal: Diagnostic test results will improve Outcome: Progressing Goal: Respiratory complications will improve Outcome: Progressing Goal: Cardiovascular complication will be avoided Outcome: Progressing   Problem: Activity: Goal: Risk for activity intolerance will decrease Outcome: Progressing   Problem: Nutrition: Goal: Adequate nutrition will be maintained Outcome: Progressing   Problem: Coping: Goal: Level of anxiety will decrease Outcome: Progressing   Problem: Elimination: Goal: Will not experience complications related to bowel motility Outcome: Progressing Goal: Will not experience complications related to urinary retention Outcome: Progressing   Problem: Pain Managment: Goal: General experience of comfort will improve Outcome: Progressing   Problem: Safety: Goal: Ability to remain free from injury will improve Outcome: Progressing   Problem: Skin Integrity: Goal: Risk for impaired skin integrity will decrease Outcome: Progressing   

## 2022-11-07 DIAGNOSIS — Z515 Encounter for palliative care: Secondary | ICD-10-CM | POA: Diagnosis not present

## 2022-11-07 DIAGNOSIS — J441 Chronic obstructive pulmonary disease with (acute) exacerbation: Secondary | ICD-10-CM | POA: Diagnosis not present

## 2022-11-07 DIAGNOSIS — Z7189 Other specified counseling: Secondary | ICD-10-CM | POA: Diagnosis not present

## 2022-11-07 DIAGNOSIS — J9601 Acute respiratory failure with hypoxia: Secondary | ICD-10-CM | POA: Diagnosis not present

## 2022-11-07 LAB — GLUCOSE, CAPILLARY
Glucose-Capillary: 126 mg/dL — ABNORMAL HIGH (ref 70–99)
Glucose-Capillary: 272 mg/dL — ABNORMAL HIGH (ref 70–99)
Glucose-Capillary: 234 mg/dL — ABNORMAL HIGH (ref 70–99)
Glucose-Capillary: 203 mg/dL — ABNORMAL HIGH (ref 70–99)

## 2022-11-07 NOTE — Progress Notes (Signed)
Pulmonary Medicine          Date: 11/07/2022,   MRN# [id number] Cody Bridges 14-Jun-1933      HISTORY OF PRESENT ILLNESS   Here with copd exacerbation, in te setting of moderate-severe copd. Sitting in chair, daughter in room. Spoke with her at length. Off high flow, on 4 liters. Speaking in full sentences. He seem to think he is doing better. Looking forward going to rehab. He is DNR.  Expand All Collapse All         Pulmonary Medicine                  Date: 11/05/2022,   MRN# [id number] Cody Bridges 1933/04/08                  AdmissionWeight: 93 kg                 CurrentWeight: 91.4 kg           CHIEF COMPLAINT:    Resp failure     HISTORY OF PRESENT ILLNESS    This is an 89 y.o. male  with past medical history of COPD/asthma, HTN, HLD, T2DM, diastolic CHF, BPH and HOH, resident at [facility], came in with hypoxia,  diff breathing.  He was placed on bipap initially. Presently on high flow ( 40 %). On talking with him, h is awre of poor lung function, quality of life is going to poor. He does not want to be intubated, short trial of bipap yes.    He is on azithro, solumedrol 40 mg q day, mucinex, incentive spiro and bronchodilators    ricuspid: TRIVIAL TR                 No TS                         241.0 cm/sec peak TR vel   26.2 mmHg peak RV pressure              Pulmonary: TRIVIAL PR                 No PS                         111.0 cm/sec peak vel      4.9 mmHg peak grad  _________________________________________________________________________________________  INTERPRETATION  NORMAL LEFT VENTRICULAR SYSTOLIC FUNCTION   WITH MILD LVH  NORMAL RIGHT VENTRICULAR SYSTOLIC FUNCTION  TRIVIAL REGURGITATION NOTED (See above)  NO VALVULAR STENOSIS  TRIVIAL MR, TR, PR  EF >55%  _________________________________________________________________________________________  Electronically signed by      MD Arnoldo Hooker on 10/19/2021 09: 59 AM            Performed By: Merla Riches, RDCS     Ordering Physician: Arnoldo Hooker    TID Ratio: 1.08  LVEF= 51%  FINDINGS: Regional wall motion:  reveals normal myocardial thickening and wall motion. The overall quality of the study is good.   Artifacts noted: no Left ventricular cavity: normal.  Perfusion Analysis:  SPECT images demonstrate homogeneous tracer distribution throughout the myocardium. Defect type : Normal   Exam End: 10/05/21 15:15    4/21: FVC was 2.64 liters, 78% of predicted/Post 2.77, 82%, 5% Change FEV1 was 1.53, 59% of predicted/Post 1.71, 66%, 12% Change FEV1 ratio was 58/Post 62 FEF 25-75% liters per second was 22% of predicted/Post  28%, 26% Change *Patient took Personal Albuterol Inhaler for Post Spirometry  LUNG VOLUMES: TLC was 87% of predicted RV was 94% of predicted  DIFFUSION CAPACITY: DLCO was 99% of predicted DLCO/VA was 126% of predicted  FLOW VOLUME LOOP: C/w obstruction  Impression Spirometry is c/w moderate obstruction, positive for bronchodilator effect Lung volumes are in normal range Diffusion capacity is in normal range     PAST MEDICAL HISTORY   Past Medical History:  Diagnosis Date   Actinic keratosis    Arthritis    Asthma    COPD (chronic obstructive pulmonary disease)    COVID-19 02/13/2021   Diabetes mellitus without complication     2015   Dyspnea    with exertion   Edema    GERD (gastroesophageal reflux disease)    HOH (hard of hearing)    AIDS   Hyperlipidemia    Hypertension    Wheezing      SURGICAL HISTORY   Past Surgical History:  Procedure Laterality Date   BUNIONECTOMY Right    CATARACT EXTRACTION W/PHACO Left 08/22/2017   Procedure: CATARACT EXTRACTION PHACO AND INTRAOCULAR LENS PLACEMENT (IOC);  Surgeon: Galen Manila, MD;  Location: [facility] ORS;  Service: Ophthalmology;  Laterality: Left;  [place] 00:43.7 AP% 12.3 CDE 5.38 Fluid pack lot # [id number]   COLONOSCOPY  2016   ELECTROMAGNETIC  NAVIGATION BROCHOSCOPY N/A 02/09/2017   Procedure: ELECTROMAGNETIC NAVIGATION BRONCHOSCOPY;  Surgeon: Erin Fulling, MD;  Location: [facility] ORS;  Service: Cardiopulmonary;  Laterality: N/A;   EYE SURGERY Right    Cataract Extraction with IOL   HAMMER TOE SURGERY Right    TONSILLECTOMY  1938     FAMILY HISTORY   Family History  Problem Relation Age of Onset   Cancer Mother    Stomach cancer Mother    Stomach cancer Sister      SOCIAL HISTORY   Social History   Tobacco Use   Smoking status: Former    Packs/day: 2.00    Years: 35.00    Additional pack years: 0.00    Total pack years: 70.00    Types: Cigarettes    Quit date: 10/30/1985    Years since quitting: 37.0   Smokeless tobacco: Never  Vaping Use   Vaping Use: Never used  Substance Use Topics   Alcohol use: Yes    Comment: occasional   Drug use: No     MEDICATIONS    Home Medication:    Current Medication:  Current Facility-Administered Medications:    acetaminophen (TYLENOL) tablet 650 mg, 650 mg, Oral, Q6H PRN, Lorretta Harp, MD, 650 mg at 11/05/22 1448   albuterol (PROVENTIL) (2.5 MG/3ML) 0.083% nebulizer solution 2.5 mg, 2.5 mg, Nebulization, Q4H PRN, Lorretta Harp, MD, 2.5 mg at 11/04/22 1013   amLODipine (NORVASC) tablet 5 mg, 5 mg, Oral, Daily, Lorretta Harp, MD, 5 mg at 11/07/22 0826   azelastine (ASTELIN) 0.1 % nasal spray 1 spray, 1 spray, Each Nare, Q1400, Lorretta Harp, MD, 1 spray at 11/07/22 1434   calcium carbonate (TUMS - dosed in mg elemental calcium) chewable tablet 200-400 mg of elemental calcium, 1-2 tablet, Oral, TID PRN, Lorretta Harp, MD   cholecalciferol (VITAMIN D3) 25 MCG (1000 UNIT) tablet 5,000 Units, 5,000 Units, Oral, Daily, Lorretta Harp, MD, 5,000 Units at 11/07/22 0826   dextromethorphan-guaiFENesin (MUCINEX DM) 30-600 MG per 12 hr tablet 1 tablet, 1 tablet, Oral, BID PRN, Lorretta Harp, MD, 1 tablet at 11/03/22 0055   docusate sodium (COLACE) capsule 100 mg, 100 mg,  Oral, BID PRN, Enedina Finner, MD    enoxaparin (LOVENOX) injection 45 mg, 0.5 mg/kg, Subcutaneous, Q24H, Ronnald Ramp, RPH, 45 mg at 11/06/22 2054   feeding supplement (ENSURE ENLIVE / ENSURE PLUS) liquid 237 mL, 237 mL, Oral, BID BM, Enedina Finner, MD, 237 mL at 11/07/22 1434   finasteride (PROSCAR) tablet 5 mg, 5 mg, Oral, Daily, Lorretta Harp, MD, 5 mg at 11/07/22 1610   hydrALAZINE (APRESOLINE) injection 5 mg, 5 mg, Intravenous, Q2H PRN, Lorretta Harp, MD   hydrochlorothiazide (HYDRODIURIL) tablet 25 mg, 25 mg, Oral, Daily, Enedina Finner, MD, 25 mg at 11/07/22 0826   insulin aspart (novoLOG) injection 0-5 Units, 0-5 Units, Subcutaneous, QHS, Lorretta Harp, MD, 2 Units at 11/06/22 2224   insulin aspart (novoLOG) injection 0-9 Units, 0-9 Units, Subcutaneous, TID WC, Lorretta Harp, MD, 5 Units at 11/07/22 1220   ipratropium-albuterol (DUONEB) 0.5-2.5 (3) MG/3ML nebulizer solution 3 mL, 3 mL, Nebulization, TID, Enedina Finner, MD, 3 mL at 11/07/22 1345   latanoprost (XALATAN) 0.005 % ophthalmic solution 1 drop, 1 drop, Left Eye, QHS, Enedina Finner, MD, 1 drop at 11/04/22 2143   mometasone-formoterol (DULERA) 100-5 MCG/ACT inhaler 2 puff, 2 puff, Inhalation, BID, Mertie Moores, MD, 2 puff at 11/07/22 0827   montelukast (SINGULAIR) tablet 10 mg, 10 mg, Oral, QHS, Meredeth Ide, Corde Antonini E, MD, 10 mg at 11/06/22 2055   multivitamin with minerals tablet 1 tablet, 1 tablet, Oral, Daily, Lorretta Harp, MD, 1 tablet at 11/07/22 0826   ondansetron (ZOFRAN) injection 4 mg, 4 mg, Intravenous, Q8H PRN, Lorretta Harp, MD   polyethylene glycol (MIRALAX / GLYCOLAX) packet 17 g, 17 g, Oral, Daily, Enedina Finner, MD, 17 g at 11/06/22 9604   polyvinyl alcohol (LIQUIFILM TEARS) 1.4 % ophthalmic solution 1 drop, 1 drop, Both Eyes, TID PRN, Lorretta Harp, MD   predniSONE (DELTASONE) tablet 40 mg, 40 mg, Oral, Q breakfast, Enedina Finner, MD, 40 mg at 11/07/22 5409   rosuvastatin (CRESTOR) tablet 20 mg, 20 mg, Oral, Daily, Lorretta Harp, MD, 20 mg at 11/07/22 8119   sodium chloride (OCEAN)  0.65 % nasal spray 1 spray, 1 spray, Each Nare, PRN, Enedina Finner, MD, 1 spray at 11/07/22 0826   tamsulosin (FLOMAX) capsule 0.4 mg, 0.4 mg, Oral, Daily, Lorretta Harp, MD, 0.4 mg at 11/07/22 1478    ALLERGIES   Clindamycin, Lisinopril, and Penicillin v potassium     REVIEW OF SYSTEMS    Review of Systems:  Gen:  Denies  fever, sweats, chills weigh loss  HEENT: Denies blurred vision, double vision, ear pain, eye pain, hearing loss, nose bleeds, sore throat Cardiac:  No dizziness, chest pain or heaviness, chest tightness,edema Resp:   less  cough or sputum porduction,less  shortness of breath, less wheezing, no hemoptysis,  Gi: Denies swallowing difficulty, stomach pain, nausea or vomiting, diarrhea, constipation, bowel incontinence Gu:  Denies bladder incontinence, burning urine Ext:   Denies Joint pain, stiffness or swelling Skin: Denies  skin rash, easy bruising or bleeding or hives Endoc:  Denies polyuria, polydipsia , polyphagia or weight change Psych:   Denies depression, insomnia or hallucinations   Other:  All other systems negative   VS: BP (!) 154/76   Pulse 92   Temp 97.6 F (36.4 C) (Oral)   Resp 20   Ht 5\' 4"  (1.626 m)   Wt 90.4 kg   SpO2 95%   BMI 34.21 kg/m      PHYSICAL EXAM    GENERAL: no fevers, chills, no weakness  no fatigue, sitting in chair at bed side, [place] o2 on HEAD: Normocephalic, atraumatic.  EYES: Pupils equal, round, reactive to light. Extraocular muscles intact. No scleral icterus.  MOUTH: Moist mucosal membrane. Dentition intact. No abscess noted.  EAR, NOSE, THROAT: Clear without exudates. No external lesions.  NECK: Supple. No thyromegaly. No nodules. No JVD.  PULMONARY: rare wheezes CARDIOVASCULAR: S1 and S2. Regular rate and rhythm. No murmurs, rubs, or gallops. No edema. Pedal pulses 2+ bilaterally.  GASTROINTESTINAL: Soft, nontender, nondistended. No masses. Positive bowel sounds. No hepatosplenomegaly.  MUSCULOSKELETAL: No  swelling, clubbing, or edema. Range of motion full in all extremities.  NEUROLOGIC: Cranial nerves II through XII are intact. No gross focal neurological deficits. Sensation intact. Reflexes intact.  SKIN: No ulceration, lesions, rashes, or cyanosis. Skin warm and dry. Turgor intact.  PSYCHIATRIC: Mood, affect within normal limits. The patient is awake, alert and oriented x 3. Insight, judgment intact.       IMAGING    DG Chest 2 View  Result Date: 11/02/2022 CLINICAL DATA:  Shortness of breath EXAM: CHEST - 2 VIEW COMPARISON:  02/13/2021 FINDINGS: Mild cardiomegaly and aortic tortuosity. There is no edema, consolidation, effusion, or pneumothorax. IMPRESSION: No evidence of active disease. Electronically Signed   By: Tiburcio PeaJonathan  Watts M.D.   On: 11/02/2022 09:40      ASSESSMENT/PLAN   Asthma/copd, flare, moderate obstruction on last pfts. Had been on trelegy , albuterol and dupixent ( primarily for his skin condition). He was lost to follow up since 2022. Present in resp failure. He seem to think he is better today compared to yesterday..Slowly responding on broad coverage. -will stay on present regimen - singulair 10 mg qhs -laba/ics -switching to po steroid and wean over one week or so -wean fio2 a tolerated -Incentive spiro -continue ot, pt -ok for d/c when bed is available ( rehab)  Thank you for allowing me to participate in the care of this patient.   Patient/Family are satisfied with care plan and all questions have been answered.  This document was prepared using Dragon voice recognition software and may include unintentional dictation errors.     Ned ClinesHerbon Johan Creveling, M.D.  Division of Pulmonary & Critical Care Medicine  [facility]

## 2022-11-07 NOTE — Progress Notes (Signed)
Physical Therapy Treatment Patient Details Name: Cody Bridges MRN: [id number] DOB: 1933-01-31 Today's Date: 11/07/2022   History of Present Illness Pt is an 40 male that presented to ED for SOB, placed on bipap. PMH of COPD/asthma, hypertension, hyperlipidemia, diabetes mellitus, diastolic CHF, GERD, BPH, hard of hearing.    PT Comments    Patient alert, agreeable to PT with encouragement, denied pain. He was able to perform sit <> stand with RW and CGA but very effortful and slow. Declined ambulation due to significant fatigue with step pivot to bed. Sit to supine with modA for BLE assistance and totalAx2 to scoot up in bed. The patient would benefit from further skilled PT intervention to continue to progress towards goals.     Recommendations for follow up therapy are one component of a multi-disciplinary discharge planning process, led by the attending physician.  Recommendations may be updated based on patient status, additional functional criteria and insurance authorization.  Follow Up Recommendations  Can patient physically be transported by private vehicle: No    Assistance Recommended at Discharge Frequent or constant Supervision/Assistance  Patient can return home with the following A lot of help with walking and/or transfers;A lot of help with bathing/dressing/bathroom;Assistance with cooking/housework;Direct supervision/assist for medications management;Assist for transportation;Direct supervision/assist for financial management;Help with stairs or ramp for entrance   Equipment Recommendations  Other (comment) (TBD at next venue of care)    Recommendations for Other Services       Precautions / Restrictions Precautions Precautions: Fall Restrictions Weight Bearing Restrictions: No     Mobility  Bed Mobility Overal bed mobility: Needs Assistance Bed Mobility: Sit to Supine       Sit to supine: Mod assist   General bed mobility comments: BLE assist     Transfers Overall transfer level: Needs assistance Equipment used: Rolling walker (2 wheels) Transfers: Sit to/from Stand Sit to Stand: Min guard           General transfer comment: from recliner BUE support, very effortful and slow    Ambulation/Gait               General Gait Details: pt declined and also noticably fatigued/SOB with transfer to sit EOB   Stairs             Wheelchair Mobility    Modified Rankin (Stroke Patients Only)       Balance Overall balance assessment: Needs assistance Sitting-balance support: Feet supported Sitting balance-Leahy Scale: Good     Standing balance support: Bilateral upper extremity supported, Reliant on assistive device for balance Standing balance-Leahy Scale: Poor                              Cognition Arousal/Alertness: Awake/alert Behavior During Therapy: WFL for tasks assessed/performed Overall Cognitive Status: Within Functional Limits for tasks assessed                                 General Comments: some delay in processing but answered all questions and followed all commands        Exercises      General Comments        Pertinent Vitals/Pain Pain Assessment Pain Assessment: No/denies pain    Home Living                          Prior  Function            PT Goals (current goals can now be found in the care plan section) Progress towards PT goals: Progressing toward goals    Frequency    Min 2X/week      PT Plan Current plan remains appropriate    Co-evaluation              AM-PAC PT "6 Clicks" Mobility   Outcome Measure  Help needed turning from your back to your side while in a flat bed without using bedrails?: A Little Help needed moving from lying on your back to sitting on the side of a flat bed without using bedrails?: A Lot Help needed moving to and from a bed to a chair (including a wheelchair)?: A Lot Help needed  standing up from a chair using your arms (e.g., wheelchair or bedside chair)?: A Lot Help needed to walk in hospital room?: A Lot Help needed climbing 3-5 steps with a railing? : Total 6 Click Score: 12    End of Session Equipment Utilized During Treatment: Oxygen (4L) Activity Tolerance: Patient limited by fatigue Patient left: in bed;with call bell/phone within reach;with bed alarm set Nurse Communication: Mobility status PT Visit Diagnosis: Other abnormalities of gait and mobility (R26.89)     Time: 3833-3832 PT Time Calculation (min) (ACUTE ONLY): 21 min  Charges:  $Therapeutic Activity: 8-22 mins                     Olga Coaster PT, DPT 1:56 PM,11/07/22

## 2022-11-07 NOTE — Progress Notes (Signed)
Daily Progress Note   Patient Name: Cody Bridges       Date: 11/07/2022 DOB: 1932-08-21  Age: 89 y.o. MRN#: [id number] Attending Physician: Enedina Finner, MD Primary Care Physician: Glori Luis, MD Admit Date: 11/02/2022  Reason for Consultation/Follow-up: Establishing goals of care  Subjective: Notes and labs reviewed.  Patient currently sitting in bedside chair on oxygen via standard nasal cannula and tolerating.  Visitor at bedside at this time and patient is actively engaging in speaking with them.  PMT will shadow for decline.  Length of Stay: 5  Current Medications: Scheduled Meds:   amLODipine  5 mg Oral Daily   azelastine  1 spray Each Nare Q1400   cholecalciferol  5,000 Units Oral Daily   enoxaparin (LOVENOX) injection  0.5 mg/kg Subcutaneous Q24H   feeding supplement  237 mL Oral BID BM   finasteride  5 mg Oral Daily   hydrochlorothiazide  25 mg Oral Daily   insulin aspart  0-5 Units Subcutaneous QHS   insulin aspart  0-9 Units Subcutaneous TID WC   ipratropium-albuterol  3 mL Nebulization TID   latanoprost  1 drop Left Eye QHS   mometasone-formoterol  2 puff Inhalation BID   montelukast  10 mg Oral QHS   multivitamin with minerals  1 tablet Oral Daily   polyethylene glycol  17 g Oral Daily   predniSONE  40 mg Oral Q breakfast   rosuvastatin  20 mg Oral Daily   tamsulosin  0.4 mg Oral Daily    Continuous Infusions:   PRN Meds: acetaminophen, albuterol, calcium carbonate, dextromethorphan-guaiFENesin, docusate sodium, hydrALAZINE, ondansetron (ZOFRAN) IV, polyvinyl alcohol, sodium chloride  Physical Exam Pulmonary:     Effort: Pulmonary effort is normal.     Comments: O2 cannula in place Neurological:     Mental Status: He is alert.             Vital  Signs: BP (!) 147/82 (BP Location: Left Arm)   Pulse 87   Temp 97.8 F (36.6 C)   Resp (!) 22   Ht 5\' 4"  (1.626 m)   Wt 90.4 kg   SpO2 95%   BMI 34.21 kg/m  SpO2: SpO2: 95 % O2 Device: O2 Device: (S) Nasal Cannula O2 Flow Rate: O2 Flow Rate (L/min): (S) 4 L/min  Intake/output summary:  Intake/Output Summary (Last 24 hours) at 11/07/2022 1205 Last data filed at 11/07/2022 0330 Gross per 24 hour  Intake --  Output 1700 ml  Net -1700 ml   LBM: Last BM Date : 11/05/22 Baseline Weight: Weight: 93 kg Most recent weight: Weight: 90.4 kg        Patient Active Problem List   Diagnosis Date Noted   COPD exacerbation 11/02/2022   Acute respiratory failure with hypoxia 11/02/2022   HLD (hyperlipidemia) 11/02/2022   Chronic diastolic CHF (congestive heart failure) 11/02/2022   Asthma exacerbation 11/02/2022   BPH (benign prostatic hyperplasia) 06/13/2022   Loose stools 06/13/2022   DOE (dyspnea on exertion) 06/13/2022   Palpitations 06/13/2022   Allergic rhinitis 09/10/2021   Foot callus 05/31/2021   History of migraine 11/10/2020   Cardiac syncope 10/22/2019   Intramuscular lipoma 10/12/2018   Bilateral carotid artery stenosis 10/05/2018   Atherosclerotic peripheral vascular disease with intermittent claudication 09/04/2018   Premature atrial complex 07/04/2018   Urine frequency 03/06/2018   Benign prostatic hyperplasia with urinary frequency 03/06/2018   Weakness of both lower extremities 05/01/2017   Orthostasis 05/01/2017   Lung nodule    Bilateral leg edema 01/26/2017   Rash and nonspecific skin eruption 10/25/2016   Diastasis recti 10/24/2016   Non-recurrent unilateral inguinal hernia without obstruction or gangrene 10/24/2016   Hypertension 10/24/2016   Diabetes mellitus without complication 09/20/2016   COPD (chronic obstructive pulmonary disease) 09/20/2016   Colon polyps 08/29/2014   Hyperlipemia, mixed 02/05/2014    Palliative Care Assessment & Plan    Recommendations/Plan: Continue current care.  Code Status:    Code Status Orders  (From admission, onward)           Start     Ordered   11/05/22 1205  Do not attempt resuscitation (DNR)  Continuous       Question Answer Comment  If patient has no pulse and is not breathing Do Not Attempt Resuscitation   If patient has a pulse and/or is breathing: Medical Treatment Goals LIMITED ADDITIONAL INTERVENTIONS: Use medication/IV fluids and cardiac monitoring as indicated; Do not use intubation or mechanical ventilation (DNI), also provide comfort medications.  Transfer to Progressive/Stepdown as indicated, avoid Intensive Care.   Consent: Discussion documented in EHR or advanced directives reviewed      11/05/22 1204           Code Status History     Date Active Date Inactive Code Status Order ID Comments User Context   11/02/2022 1242 11/05/2022 1204 Full Code [id number]  Lorretta Harp, MD ED   02/13/2021 1323 02/16/2021 1837 Full Code [id number]  Cox, Nadyne Coombes, DO ED        Thank you for allowing the Palliative Medicine Team to assist in the care of this patient.  Morton Stall, NP  Please contact Palliative Medicine Team phone at [phone] for questions and concerns.

## 2022-11-07 NOTE — TOC Progression Note (Addendum)
Transition of Care [facility]) - Progression Note    Patient Details  Name: Cody Bridges MRN: [id number] Date of Birth: 1933/06/01  [facility] CM/SW Contact  Margarito Liner, LCSW Phone Number: 11/07/2022, 10:18 AM  Clinical Narrative:  Cody Bridges is reviewing referral. MD confirmed he will not need bipap at discharge.   10:37 am: [facility] is unable to offer a bed. Checking with other local facility to see if they can accept for short-term rehab with potential need to transition to LTC.  11:41 am: Unable to reach daughter by phone. Asked RN to notify if she arrives or have her call CSW if she calls.  12:14 pm: Met with patient and daughter at bedside. Provided update. Daughter requested that [facility] review referral because she lives about 5 minutes from the facility. Left voicemail for admissions coordinator asking her to review. Daughter just found out that patient has a Scientist, water quality. She will get that information to CSW today.  1:27 pm: Daughter called with LTC insurance policy information. Company is called [employer]. Policy # [id number]. Phone number [phone]. No updates from [facility] yet.  2:07 pm: [facility] has offered a bed. They said daughter will have to facilitate payment through his LTC insurance policy. Daughter is aware. Will plan for discharge tomorrow if stable. [facility] service has been scheduled for 4:00 in case [facility] or [facility] cannot transport.  Expected Discharge Plan and Services                                               Social Determinants of Health (SDOH) Interventions SDOH Screenings   Food Insecurity: No Food Insecurity (11/03/2022)  Housing: Low Risk  (11/03/2022)  Transportation Needs: No Transportation Needs (11/03/2022)  Utilities: Not At Risk (11/03/2022)  Depression (PHQ2-9): Low Risk  (06/13/2022)  Tobacco Use: Medium Risk (11/02/2022)    Readmission Risk Interventions      No data to display

## 2022-11-07 NOTE — Progress Notes (Signed)
[facility]  - [place] at [facility]   PATIENT NAME: Cody Bridges    MR#:  [id number]  DATE OF BIRTH:  November 14, 1932  SUBJECTIVE:  No family at bedside Pt came in with increasing shortness of breath and diagnosed with COPD flare. Quite week and deconditioned. Pt on 4 liter [place]   VITALS:  Blood pressure 128/76, pulse 84, temperature 98.2 F (36.8 C), resp. rate 18, height 5\' 4"  (1.626 m), weight 90.4 kg, SpO2 92 %.  PHYSICAL EXAMINATION:   GENERAL:  89 y.o.-year-old patient with no acute distress. weak, deconditioned LUNGS:decreased breath sounds bilaterally, no wheezing CARDIOVASCULAR: S1, S2 normal. No murmur   ABDOMEN: Soft, nontender, nondistended.  EXTREMITIES: No  edema b/l.    NEUROLOGIC: nonfocal  patient is alert and awake  LABORATORY PANEL:  CBC Recent Labs  Lab 11/03/22 0650  WBC 12.3*  HGB 11.8*  HCT 37.6*  PLT 310     Chemistries  Recent Labs  Lab 11/03/22 0650  NA 139  K 3.6  CL 99  CO2 30  GLUCOSE 186*  BUN 33*  CREATININE 1.10  CALCIUM 9.1    Cardiac Enzymes No results for input(s): "TROPONINI" in the last 168 hours. RADIOLOGY:  No results found.  Assessment and Plan Cody Bridges is a 89 y.o. male with medical history significant of COPD/asthma, hypertension, hyperlipidemia, diabetes mellitus, diastolic CHF, GERD, BPH, hard of hearing, who presents with SOB.  Patient is normally not using oxygen, but was found to have oxygen saturation 89% on room air. Initially started on 4 L oxygen, but pt still has severe respiratory distress, cannot speak in full sentence, using accessory muscle for breathing.  BiPAP is started in ED.   Currently off bipap  Acute respiratory failure with hypoxia due to COPD/asthma exacerbation/Severe Emphysema: Patient has severe respiratory distress, requiring BiPAP.  Chest x-ray negative. Pt is using anti-IL 4 receptor antibody, Dupilumab at home.  -BiPAP prn -Bronchodilators -Solu-Medrol 40 mg IV  qd--change to po prednisone -Azithromycin (500 mg by IV, then 250 mg orally)--completed -Mucinex for cough  -Incentive spirometry -Nasal cannula oxygen as needed to maintain O2 saturation 93% or greater when  -- drops sats easily with minimal exertion. Placed on/ high flow nasal cannula oxygen--wean down when able to -Pulmonary consult with Dr Meredeth Ide --pt down to 4 liter [place]   Hypertension -IV hydralazine as needed -Amlodipine, HCTZ   Chronic diastolic CHF (congestive heart failure): 2D echo on 10/18/2021 showed EF of 55% with grade 1 diastolic dysfunction.  Patient has trace leg edema, no JVD.  No pulmonary edema by chest x-ray.  CHF seem to be compensated.  HLD (hyperlipidemia) -Crestor   Diabetes mellitus without complication: Recent A1c 6.9, well-controlled.  Patient taking metformin -SSI   BPH (benign prostatic hyperplasia) -Flomax, Proscar   Appreciate palliative care see pt to discuss GOC--pt is DNR/DNI Poor prognosis    DVT ppx:  SQ Lovenox   Code Status:DNR   Family Communication: Rosalita Chessman [place]    Disposition Plan:  TBD  Consults called:  Palliative ,pulmonary Level of care: Progressive Status is: Inpatient Remains inpatient appropriate because: COPD flare Weaning down oxygen  D/c to rehab in am if stable   TOTAL TIME TAKING CARE OF THIS PATIENT: 35 minutes.  >50% time spent on counselling and coordination of care  Note: This dictation was prepared with Dragon dictation along with smaller phrase technology. Any transcriptional errors that result from this process are unintentional.  Nani Skillern.D  [facility]   CC: Primary care physician; Glori Luis, MD

## 2022-11-08 DIAGNOSIS — E1169 Type 2 diabetes mellitus with other specified complication: Secondary | ICD-10-CM | POA: Diagnosis not present

## 2022-11-08 DIAGNOSIS — K219 Gastro-esophageal reflux disease without esophagitis: Secondary | ICD-10-CM | POA: Diagnosis not present

## 2022-11-08 DIAGNOSIS — E785 Hyperlipidemia, unspecified: Secondary | ICD-10-CM | POA: Diagnosis not present

## 2022-11-08 DIAGNOSIS — R0902 Hypoxemia: Secondary | ICD-10-CM | POA: Diagnosis not present

## 2022-11-08 DIAGNOSIS — I5032 Chronic diastolic (congestive) heart failure: Secondary | ICD-10-CM | POA: Diagnosis not present

## 2022-11-08 DIAGNOSIS — N400 Enlarged prostate without lower urinary tract symptoms: Secondary | ICD-10-CM | POA: Diagnosis not present

## 2022-11-08 DIAGNOSIS — L209 Atopic dermatitis, unspecified: Secondary | ICD-10-CM | POA: Diagnosis not present

## 2022-11-08 DIAGNOSIS — J9601 Acute respiratory failure with hypoxia: Secondary | ICD-10-CM | POA: Diagnosis not present

## 2022-11-08 DIAGNOSIS — J45901 Unspecified asthma with (acute) exacerbation: Secondary | ICD-10-CM | POA: Diagnosis not present

## 2022-11-08 DIAGNOSIS — I10 Essential (primary) hypertension: Secondary | ICD-10-CM | POA: Diagnosis not present

## 2022-11-08 DIAGNOSIS — J441 Chronic obstructive pulmonary disease with (acute) exacerbation: Secondary | ICD-10-CM | POA: Diagnosis not present

## 2022-11-08 DIAGNOSIS — Z7401 Bed confinement status: Secondary | ICD-10-CM | POA: Diagnosis not present

## 2022-11-08 LAB — GLUCOSE, CAPILLARY
Glucose-Capillary: 149 mg/dL — ABNORMAL HIGH (ref 70–99)
Glucose-Capillary: 170 mg/dL — ABNORMAL HIGH (ref 70–99)
Glucose-Capillary: 150 mg/dL — ABNORMAL HIGH (ref 70–99)

## 2022-11-08 LAB — CBC
HCT: 43.7 % (ref 39.0–52.0)
Hemoglobin: 13.9 g/dL (ref 13.0–17.0)
MCH: 30.5 pg (ref 26.0–34.0)
MCHC: 31.8 g/dL (ref 30.0–36.0)
MCV: 96 fL (ref 80.0–100.0)
Platelets: 387 10*3/uL (ref 150–400)
RBC: 4.55 MIL/uL (ref 4.22–5.81)
RDW: 12.9 % (ref 11.5–15.5)
WBC: 13.5 10*3/uL — ABNORMAL HIGH (ref 4.0–10.5)
nRBC: 0 % (ref 0.0–0.2)

## 2022-11-08 MED ORDER — ENSURE ENLIVE PO LIQD: 237.0000 mL | Freq: Two times a day (BID) | ORAL | 12 refills | Status: AC

## 2022-11-08 MED ORDER — MONTELUKAST SODIUM 10 MG PO TABS: 10.0000 mg | ORAL_TABLET | Freq: Every day | ORAL | 1 refills | Status: AC

## 2022-11-08 MED ORDER — POLYETHYLENE GLYCOL 3350 17 G PO PACK: 17.0000 g | PACK | Freq: Every day | ORAL | 0 refills | Status: AC

## 2022-11-08 MED ORDER — PREDNISONE 20 MG PO TABS: 20.0000 mg | ORAL_TABLET | Freq: Every day | ORAL | 0 refills | Status: AC

## 2022-11-08 MED ORDER — METFORMIN HCL 500 MG PO TABS
500.0000 mg | ORAL_TABLET | Freq: Two times a day (BID) | ORAL | Status: DC
  Administered 2022-11-08: 500 mg via ORAL
  Filled 2022-11-08: qty 1

## 2022-11-08 MED ORDER — IPRATROPIUM-ALBUTEROL 0.5-2.5 (3) MG/3ML IN SOLN: 3.0000 mL | RESPIRATORY_TRACT | 1 refills | Status: AC | PRN

## 2022-11-08 MED ORDER — IPRATROPIUM-ALBUTEROL 0.5-2.5 (3) MG/3ML IN SOLN: 3.0000 mL | Freq: Two times a day (BID) | RESPIRATORY_TRACT | Status: DC

## 2022-11-08 MED ORDER — DM-GUAIFENESIN ER 30-600 MG PO TB12: 1.0000 | ORAL_TABLET | Freq: Two times a day (BID) | ORAL | 0 refills | Status: AC | PRN

## 2022-11-08 MED ORDER — TRELEGY ELLIPTA 100-62.5-25 MCG/ACT IN AEPB: 1.0000 | INHALATION_SPRAY | Freq: Every day | RESPIRATORY_TRACT | 11 refills | Status: AC

## 2022-11-08 MED ORDER — MOMETASONE FURO-FORMOTEROL FUM 100-5 MCG/ACT IN AERO: 2.0000 | INHALATION_SPRAY | Freq: Two times a day (BID) | RESPIRATORY_TRACT | 2 refills | Status: DC

## 2022-11-08 MED ORDER — SALINE SPRAY 0.65 % NA SOLN: 1.0000 | NASAL | 0 refills | Status: AC | PRN

## 2022-11-08 MED ORDER — PREDNISONE 20 MG PO TABS: 20.0000 mg | ORAL_TABLET | Freq: Every day | ORAL | Status: DC

## 2022-11-08 NOTE — Progress Notes (Signed)
Physical Therapy Treatment Patient Details Name: Jacory Mcgrane Milbrath MRN: [id number] DOB: 1933/03/12 Today's Date: 11/08/2022   History of Present Illness Pt is an 63 male that presented to ED for SOB, placed on bipap. PMH of COPD/asthma, hypertension, hyperlipidemia, diabetes mellitus, diastolic CHF, GERD, BPH, hard of hearing.    PT Comments    Treatment session focused on addressing pt and family needs, discussing benefits of short term Rehab, and answering questions and concerns of pt and pt's family. Pt able to demonstrate improved ability to transfer to edge of bed with MinA and less SOB. Good sitting balance with slight initial dizziness resolving after ~2 minutes. Pt able to stand from bed to RW and advance a few steps to bedside chair without LOB and fair safety awareness. Pt slightly tachy with minimal SOB remaining in lower 90's on 2L O2.  Awaiting transition to SNF this pm.   Recommendations for follow up therapy are one component of a multi-disciplinary discharge planning process, led by the attending physician.  Recommendations may be updated based on patient status, additional functional criteria and insurance authorization.  Follow Up Recommendations  Can patient physically be transported by private vehicle: No    Assistance Recommended at Discharge Frequent or constant Supervision/Assistance  Patient can return home with the following A lot of help with walking and/or transfers;A lot of help with bathing/dressing/bathroom;Assistance with cooking/housework;Direct supervision/assist for medications management;Assist for transportation;Direct supervision/assist for financial management;Help with stairs or ramp for entrance   Equipment Recommendations  Other (comment) (TBD at next facility)    Recommendations for Other Services       Precautions / Restrictions Precautions Precautions: Fall Restrictions Weight Bearing Restrictions: No     Mobility  Bed Mobility Overal bed  mobility: Needs Assistance Bed Mobility: Supine to Sit     Supine to sit: HOB elevated, Min assist     General bed mobility comments: BLE assist    Transfers Overall transfer level: Needs assistance Equipment used: Rolling walker (2 wheels) Transfers: Sit to/from Stand Sit to Stand: Min guard, From elevated surface           General transfer comment: Able to stand on second attempt    Ambulation/Gait Ambulation/Gait assistance: Min assist Gait Distance (Feet): 2 Feet Assistive device: Rolling walker (2 wheels) Gait Pattern/deviations: Step-to pattern, Decreased step length - left, Decreased stance time - right, Trunk flexed       General Gait Details: 2 L O2, SpO2 dropping slightly to 88% with HR between 124-134bpm   Stairs             Wheelchair Mobility    Modified Rankin (Stroke Patients Only)       Balance                                            Cognition Arousal/Alertness: Awake/alert Behavior During Therapy: WFL for tasks assessed/performed Overall Cognitive Status: Within Functional Limits for tasks assessed                                 General Comments:  (cognition improving)        Exercises      General Comments General comments (skin integrity, edema, etc.): Long discussion with pt and pt's daughter regarding current LOF, d/c recommendations for SNF, and benefits of continued  Rehab prior to returning to independent living. Questions and concerns addressed at length      Pertinent Vitals/Pain Pain Assessment Pain Assessment: No/denies pain    Home Living                          Prior Function            PT Goals (current goals can now be found in the care plan section) Acute Rehab PT Goals Patient Stated Goal: to feel better Progress towards PT goals: Progressing toward goals    Frequency    Min 2X/week      PT Plan Current plan remains appropriate     Co-evaluation              AM-PAC PT "6 Clicks" Mobility   Outcome Measure  Help needed turning from your back to your side while in a flat bed without using bedrails?: A Little Help needed moving from lying on your back to sitting on the side of a flat bed without using bedrails?: A Lot Help needed moving to and from a bed to a chair (including a wheelchair)?: A Lot Help needed standing up from a chair using your arms (e.g., wheelchair or bedside chair)?: A Lot Help needed to walk in hospital room?: A Lot Help needed climbing 3-5 steps with a railing? : Total 6 Click Score: 12    End of Session Equipment Utilized During Treatment: Gait belt;Oxygen (2L [place]) Activity Tolerance: Patient tolerated treatment well Patient left: in chair;with call bell/phone within reach;with chair alarm set;with family/visitor present Nurse Communication: Mobility status PT Visit Diagnosis: Other abnormalities of gait and mobility (R26.89)     Time: 3013-1438 PT Time Calculation (min) (ACUTE ONLY): 41 min  Charges:  $Gait Training: 8-22 mins $Therapeutic Activity: 23-37 mins                    Zadie Cleverly, PTA  DEJAUN CHROMY 11/08/2022, 1:21 PM

## 2022-11-08 NOTE — TOC Transition Note (Addendum)
Transition of Care [facility]) - CM/SW Discharge Note   Patient Details  Name: Cody Bridges MRN: [id number] Date of Birth: [birth year]  Transition of [facility]) CM/SW Contact:  Margarito Liner, LCSW Phone Number: 11/08/2022, 1:35 PM   Clinical Narrative: Patient has orders to discharge to [facility] today. RN will call report to [phone] (Room 701A). [facility] will be here around 4:00 to transport. Made Care Patrol referral to assist with setting up PCS services if patient returns to [facility] after rehab. No further concerns. CSW signing off.  Final next level of care: Skilled Nursing Facility Barriers to Discharge: Barriers Resolved   Patient Goals and CMS Choice   Choice offered to / list presented to : Patient, Adult Children  Discharge Placement PASRR number recieved: 11/04/22 PASRR number recieved: 11/04/22            [facility] bed at: [facility] Patient to be transferred to facility by: [facility] Name of family member notified: Rosalita Chessman [place] Patient and family notified of of transfer: 11/08/22  Discharge Plan and Services Additional resources added to the After Visit Summary for                                       Social Determinants of Health (SDOH) Interventions SDOH Screenings   Food Insecurity: No Food Insecurity (11/03/2022)  Housing: Low Risk  (11/03/2022)  Transportation Needs: No Transportation Needs (11/03/2022)  Utilities: Not At Risk (11/03/2022)  Depression (PHQ2-9): Low Risk  (06/13/2022)  Tobacco Use: Medium Risk (11/02/2022)     Readmission Risk Interventions     No data to display

## 2022-11-08 NOTE — Plan of Care (Signed)
Per chart review, patient is discharging to SNF.  PMT will sign off at this time.  Please reconsult for any needs.

## 2022-11-08 NOTE — Discharge Summary (Signed)
Physician Discharge Summary   Patient: Cody Bridges MRN: [id number] DOB: 10/08/1932  Admit date:     11/02/2022  Discharge date: 11/08/22  Discharge Physician: Enedina Finner   PCP: Glori Luis, MD   Recommendations at discharge:    Use your oxygen,inhaler as per instructions F/u Dr Meredeth Ide in 1-2 weeks F/u PCP in 1-2 weeks  Discharge Diagnoses: Principal Problem:   COPD exacerbation Active Problems:   Asthma exacerbation   Acute respiratory failure with hypoxia   Hypertension   Chronic diastolic CHF (congestive heart failure)   HLD (hyperlipidemia)   Diabetes mellitus without complication   BPH (benign prostatic hyperplasia)  Assessment and Plan Cody Bridges is a 89 y.o. male with medical history significant of COPD/asthma, hypertension, hyperlipidemia, diabetes mellitus, diastolic CHF, GERD, BPH, hard of hearing, who presents with SOB.  Patient is normally not using oxygen, but was found to have oxygen saturation 89% on room air. Initially started on 4 L oxygen, but pt still has severe respiratory distress, cannot speak in full sentence, using accessory muscle for breathing.  BiPAP is started in ED.    Currently off bipap   Acute respiratory failure with hypoxia due to COPD/asthma exacerbation/Severe Emphysema: Patient has severe respiratory distress, requiring BiPAP.  Chest x-ray negative. Pt is using anti-IL 4 receptor antibody, Dupilumab at home. -Bronchodilators -Solu-Medrol 40 mg IV qd--change to po prednisone -Azithromycin (500 mg by IV, then 250 mg orally)--completed -Mucinex for cough  -Incentive spirometry -Nasal cannula oxygen as needed to maintain O2 saturation 93% or greater when  -pt was on high flow nasal cannula oxygen--weaned down to 2 L [place] -Pulmonary consult with Dr Meredeth Ide   Hypertension -IV hydralazine as needed -Amlodipine, HCTZ   Chronic diastolic CHF (congestive heart failure): 2D echo on 10/18/2021 showed EF of 55% with grade 1 diastolic  dysfunction.  Patient has trace leg edema, no JVD.  No pulmonary edema by chest x-ray.  CHF seem to be compensated.   HLD (hyperlipidemia) -Crestor   Diabetes mellitus without complication: Recent A1c 6.9, well-controlled.  Patient taking metformin -SSI --resumed metformin   BPH (benign prostatic hyperplasia) -Flomax, proscar   Appreciate palliative care see pt to discuss GOC--pt is DNR/DNI Poor prognosis    DVT ppx:  SQ Lovenox  Code Status:DNR  Family Communication: Rosalita Chessman [place]   Disposition Plan:  [facility]  Consults called:  Palliative ,pulmonary      Diet recommendation:  Discharge Diet Orders (From admission, onward)     Start     Ordered   11/08/22 0000  Diet - low sodium heart healthy        11/08/22 0857   11/08/22 0000  Diet Carb Modified        11/08/22 0857            DISCHARGE MEDICATION: Allergies as of 11/08/2022       Reactions   Clindamycin Hives   Lisinopril Cough   Penicillin V Potassium Rash, Other (See Comments)   Has patient had a PCN reaction causing immediate rash, facial/tongue/throat swelling, SOB or lightheadedness with hypotension: Unknown Has patient had a PCN reaction causing severe rash involving mucus membranes or skin necrosis: Unknown Has patient had a PCN reaction that required hospitalization: No Has patient had a PCN reaction occurring within the last 10 years: No If all of the above answers are "NO", then may proceed with Cephalosporin use.        Medication List  TAKE these medications    Accu-Chek FastClix Lancets Misc Use up to Once daily as Directed  DX:  E11.9   Accu-Chek Guide test strip Generic drug: glucose blood USE UP TO ONCE DAILY AS DIRECTED DX: E11.9   acetaminophen 500 MG tablet Commonly known as: TYLENOL Take 1,000 mg by mouth every 6 (six) hours as needed for mild pain or moderate pain.   albuterol 108 (90 Base) MCG/ACT inhaler Commonly known as: VENTOLIN HFA INHALE 1-2  PUFFS BY MOUTH EVERY 6 HOURS AS NEEDED FOR WHEEZE OR SHORTNESS OF BREATH   amLODipine 5 MG tablet Commonly known as: NORVASC Take 5 mg by mouth daily.   Azelastine HCl 137 MCG/SPRAY Soln PLACE 2 SPRAYS INTO BOTH NOSTRILS 2 (TWO) TIMES DAILY. USE IN EACH NOSTRIL AS DIRECTED   blood glucose meter kit and supplies Kit Dispense based on patient and insurance preference. Use once daily as directed. (FOR ICD-10  E11.9).   calcium carbonate 500 MG chewable tablet Commonly known as: TUMS - dosed in mg elemental calcium Chew 1-2 tablets by mouth 3 (three) times daily as needed (for acid reflux/indigestion.).   carboxymethylcellulose 0.5 % Soln Commonly known as: REFRESH PLUS Apply 1 drop to eye 3 (three) times daily as needed (dry eyes).   CeraVe Daily Moisturizing Lotn Apply 1 application topically daily as needed (dry skin).   dextromethorphan-guaiFENesin 30-600 MG 12hr tablet Commonly known as: MUCINEX DM Take 1 tablet by mouth 2 (two) times daily as needed for cough.   Dupixent 300 MG/2ML prefilled syringe Generic drug: dupilumab INJECT 1 SYRINGE UNDER THE SKIN EVERY OTHER WEEK   feeding supplement Liqd Take 237 mLs by mouth 2 (two) times daily between meals.   finasteride 5 MG tablet Commonly known as: PROSCAR Take 1 tablet (5 mg total) by mouth daily.   hydrochlorothiazide 25 MG tablet Commonly known as: HYDRODIURIL TAKE 1 TABLET BY MOUTH EVERY DAY   ipratropium-albuterol 0.5-2.5 (3) MG/3ML Soln Commonly known as: DUONEB Take 3 mLs by nebulization every 4 (four) hours as needed.   latanoprost 0.005 % ophthalmic solution Commonly known as: XALATAN Place 1 drop into the left eye at bedtime.   metFORMIN 500 MG tablet Commonly known as: GLUCOPHAGE TAKE 1 TABLET BY MOUTH TWICE A DAY   mometasone-formoterol 100-5 MCG/ACT Aero Commonly known as: DULERA Inhale 2 puffs into the lungs 2 (two) times daily.   montelukast 10 MG tablet Commonly known as: SINGULAIR Take 1  tablet (10 mg total) by mouth at bedtime.   multivitamin with minerals Tabs tablet Take 1 tablet by mouth daily.   polyethylene glycol 17 g packet Commonly known as: MIRALAX / GLYCOLAX Take 17 g by mouth daily. Start taking on: November 09, 2022   predniSONE 20 MG tablet Commonly known as: DELTASONE Take 1 tablet (20 mg total) by mouth daily with breakfast for 3 days. Start taking on: November 09, 2022   PRESERVISION AREDS PO Take 1 capsule by mouth in the morning and at bedtime.   rosuvastatin 20 MG tablet Commonly known as: CRESTOR TAKE 1 TABLET BY MOUTH EVERY DAY   sodium chloride 0.65 % Soln nasal spray Commonly known as: OCEAN Place 1 spray into both nostrils as needed for congestion.   tamsulosin 0.4 MG Caps capsule Commonly known as: FLOMAX Take 1 capsule (0.4 mg total) by mouth daily.   Trelegy Ellipta 100-62.5-25 MCG/ACT Aepb Generic drug: Fluticasone-Umeclidin-Vilant Inhale 1 puff into the lungs daily.   Vitamin D-3 125 MCG (5000 UT) Tabs Take  5,000 Units by mouth daily.        Contact information for follow-up providers     Glori Luis, MD. Schedule an appointment as soon as possible for a visit.   Specialty: Family Medicine Why: hospital f/u Contact information: [address] [place] [address] [phone]         Mertie Moores, MD. Schedule an appointment as soon as possible for a visit.   Specialty: Specialist Why: End stageCOPD Contact information: [address] [facility] - PULMONOLOGY [place] [address] [phone]              Contact information for after-discharge care     Destination     [facility] Preferred SNF .   Service: Skilled Nursing Contact information: [address] [place] [address] [phone]                     Filed Weights   11/06/22 0455 11/07/22 0543 11/08/22 0500  Weight: 91.6 kg 90.4 kg 93 kg      Condition at discharge: fair  The results of significant diagnostics from this hospitalization (including imaging, microbiology, ancillary and laboratory) are listed below for reference.   Imaging Studies: DG Chest 2 View  Result Date: 11/02/2022 CLINICAL DATA:  Shortness of breath EXAM: CHEST - 2 VIEW COMPARISON:  02/13/2021 FINDINGS: Mild cardiomegaly and aortic tortuosity. There is no edema, consolidation, effusion, or pneumothorax. IMPRESSION: No evidence of active disease. Electronically Signed   By: Tiburcio Pea M.D.   On: 11/02/2022 09:40    Microbiology: Results for orders placed or performed during the hospital encounter of 11/02/22  Resp panel by RT-PCR (RSV, Flu A&B, Covid) Anterior Nasal Swab     Status: None   Collection Time: 11/02/22 11:56 AM   Specimen: Anterior Nasal Swab  Result Value Ref Range Status   SARS Coronavirus 2 by RT PCR NEGATIVE NEGATIVE Final    Comment: (NOTE) SARS-CoV-2 target nucleic acids are NOT DETECTED.  The SARS-CoV-2 RNA is generally detectable in upper respiratory specimens during the acute phase of infection. The lowest concentration of SARS-CoV-2 viral copies this assay can detect is 138 copies/mL. A negative result does not preclude SARS-Cov-2 infection and should not be used as the sole basis for treatment or other patient management decisions. A negative result may occur with  improper specimen collection/handling, submission of specimen other than nasopharyngeal swab, presence of viral mutation(s) within the areas targeted by this assay, and inadequate number of viral copies(<138 copies/mL). A negative result must be combined with clinical observations, patient history, and epidemiological information. The expected result is Negative.  Fact Sheet for Patients:  BloggerCourse.com  Fact Sheet for Healthcare Providers:  SeriousBroker.it  This test is no t yet approved or  cleared by the [place] FDA and  has been authorized for detection and/or diagnosis of SARS-CoV-2 by FDA under an Emergency Use Authorization (EUA). This EUA will remain  in effect (meaning this test can be used) for the duration of the COVID-19 declaration under Section 564(b)(1) of the Act, 21 U.S.C.section 360bbb-3(b)(1), unless the authorization is terminated  or revoked sooner.       Influenza A by PCR NEGATIVE NEGATIVE Final   Influenza B by PCR NEGATIVE NEGATIVE Final    Comment: (NOTE) The Xpert Xpress SARS-CoV-2/FLU/RSV plus assay is intended as an aid in the diagnosis of influenza from Nasopharyngeal swab specimens and should not be used  as a sole basis for treatment. Nasal washings and aspirates are unacceptable for Xpert Xpress SARS-CoV-2/FLU/RSV testing.  Fact Sheet for Patients: BloggerCourse.com  Fact Sheet for Healthcare Providers: SeriousBroker.it  This test is not yet approved or cleared by the [place] FDA and has been authorized for detection and/or diagnosis of SARS-CoV-2 by FDA under an Emergency Use Authorization (EUA). This EUA will remain in effect (meaning this test can be used) for the duration of the COVID-19 declaration under Section 564(b)(1) of the Act, 21 U.S.C. section 360bbb-3(b)(1), unless the authorization is terminated or revoked.     Resp Syncytial Virus by PCR NEGATIVE NEGATIVE Final    Comment: (NOTE) Fact Sheet for Patients: BloggerCourse.com  Fact Sheet for Healthcare Providers: SeriousBroker.it  This test is not yet approved or cleared by the [place] FDA and has been authorized for detection and/or diagnosis of SARS-CoV-2 by FDA under an Emergency Use Authorization (EUA). This EUA will remain in effect (meaning this test can be used) for the duration of the COVID-19 declaration under Section 564(b)(1) of the Act, 21  U.S.C. section 360bbb-3(b)(1), unless the authorization is terminated or revoked.  Performed at [facility], [address] [place], [place] [address]   MRSA Next Gen by PCR, Nasal     Status: None   Collection Time: 11/03/22 10:50 PM   Specimen: Nasal Mucosa; Nasal Swab  Result Value Ref Range Status   MRSA by PCR Next Gen NOT DETECTED NOT DETECTED Final    Comment: (NOTE) The GeneXpert MRSA Assay (FDA approved for NASAL specimens only), is one component of a comprehensive MRSA colonization surveillance program. It is not intended to diagnose MRSA infection nor to guide or monitor treatment for MRSA infections. Test performance is not FDA approved in patients less than 10 years old. Performed at [facility], [address] [place], [place] [address]     Labs: CBC: Recent Labs  Lab 11/02/22 [id number] 11/03/22 0650 11/08/22 0434  WBC 13.0* 12.3* 13.5*  HGB 11.6* 11.8* 13.9  HCT 36.3* 37.6* 43.7  MCV 97.3 98.7 96.0  PLT 331 310 387   Basic Metabolic Panel: Recent Labs  Lab 11/02/22 0925 11/03/22 0650  NA 135 139  K 3.6 3.6  CL 97* 99  CO2 28 30  GLUCOSE 153* 186*  BUN 24* 33*  CREATININE 1.10 1.10  CALCIUM 9.0 9.1   CBG: Recent Labs  Lab 11/07/22 0740 11/07/22 1204 11/07/22 1518 11/07/22 2034 11/08/22 0825  GLUCAP 126* 272* 234* 203* 149*    Discharge time spent: greater than 30 minutes.  Signed: Enedina Finner, MD [facility] 11/08/2022

## 2022-11-08 NOTE — Care Management Important Message (Signed)
Important Message  Patient Details  Name: Cody Bridges MRN: [id number] Date of Birth: [birth year]   Medicare Important Message Given:  Yes     Johnell Comings 11/08/2022, 11:26 AM

## 2022-11-08 NOTE — Discharge Instructions (Signed)
[place] your oxygen ,inhalers as per instructions

## 2022-11-09 ENCOUNTER — Telehealth: Payer: Self-pay | Admitting: Family Medicine

## 2022-11-09 NOTE — Telephone Encounter (Signed)
Contacted Rodena Medin [facility]all to schedule their annual wellness visit. Patient declined to schedule AWV at this time.  I spoke to someone at patient's home number. Patient went from the Hospital and is now in Rehab at [facility].  Thank you,  [facility] Support [facility] Direct dial  [phone]

## 2022-11-10 ENCOUNTER — Ambulatory Visit: Payer: Medicare Other

## 2022-11-14 ENCOUNTER — Ambulatory Visit (INDEPENDENT_AMBULATORY_CARE_PROVIDER_SITE_OTHER): Payer: Medicare Other

## 2022-11-14 DIAGNOSIS — L209 Atopic dermatitis, unspecified: Secondary | ICD-10-CM

## 2022-11-14 MED ORDER — DUPILUMAB 300 MG/2ML ~~LOC~~ SOSY
300.0000 mg | PREFILLED_SYRINGE | Freq: Once | SUBCUTANEOUS | Status: AC
Start: 2022-11-14 — End: 2022-11-14
  Administered 2022-11-14: 300 mg via SUBCUTANEOUS

## 2022-11-14 NOTE — Progress Notes (Signed)
Patient here today for two week Dupixent injection for severe atopic dermatitis. Dupixent 300mg /53mL injected into left upper arm. Patient tolerated well.    LOT: [id number] EXP: 11/2024 CNO:[id number]   Evorn Gong RMA

## 2022-11-15 ENCOUNTER — Other Ambulatory Visit: Payer: Self-pay

## 2022-11-15 NOTE — Progress Notes (Signed)
Error

## 2022-12-01 DIAGNOSIS — K219 Gastro-esophageal reflux disease without esophagitis: Secondary | ICD-10-CM | POA: Diagnosis not present

## 2022-12-01 DIAGNOSIS — L03116 Cellulitis of left lower limb: Secondary | ICD-10-CM | POA: Diagnosis not present

## 2022-12-01 DIAGNOSIS — I110 Hypertensive heart disease with heart failure: Secondary | ICD-10-CM | POA: Diagnosis not present

## 2022-12-01 DIAGNOSIS — H409 Unspecified glaucoma: Secondary | ICD-10-CM | POA: Diagnosis not present

## 2022-12-01 DIAGNOSIS — K5900 Constipation, unspecified: Secondary | ICD-10-CM | POA: Diagnosis not present

## 2022-12-01 DIAGNOSIS — J4551 Severe persistent asthma with (acute) exacerbation: Secondary | ICD-10-CM | POA: Diagnosis not present

## 2022-12-01 DIAGNOSIS — H3530 Unspecified macular degeneration: Secondary | ICD-10-CM | POA: Diagnosis not present

## 2022-12-01 DIAGNOSIS — N400 Enlarged prostate without lower urinary tract symptoms: Secondary | ICD-10-CM | POA: Diagnosis not present

## 2022-12-01 DIAGNOSIS — E46 Unspecified protein-calorie malnutrition: Secondary | ICD-10-CM | POA: Diagnosis not present

## 2022-12-01 DIAGNOSIS — Z7952 Long term (current) use of systemic steroids: Secondary | ICD-10-CM | POA: Diagnosis not present

## 2022-12-01 DIAGNOSIS — E559 Vitamin D deficiency, unspecified: Secondary | ICD-10-CM | POA: Diagnosis not present

## 2022-12-01 DIAGNOSIS — H9190 Unspecified hearing loss, unspecified ear: Secondary | ICD-10-CM | POA: Diagnosis not present

## 2022-12-01 DIAGNOSIS — J9601 Acute respiratory failure with hypoxia: Secondary | ICD-10-CM | POA: Diagnosis not present

## 2022-12-01 DIAGNOSIS — Z7984 Long term (current) use of oral hypoglycemic drugs: Secondary | ICD-10-CM | POA: Diagnosis not present

## 2022-12-01 DIAGNOSIS — L309 Dermatitis, unspecified: Secondary | ICD-10-CM | POA: Diagnosis not present

## 2022-12-01 DIAGNOSIS — H04123 Dry eye syndrome of bilateral lacrimal glands: Secondary | ICD-10-CM | POA: Diagnosis not present

## 2022-12-01 DIAGNOSIS — Z7951 Long term (current) use of inhaled steroids: Secondary | ICD-10-CM | POA: Diagnosis not present

## 2022-12-01 DIAGNOSIS — Z9181 History of falling: Secondary | ICD-10-CM | POA: Diagnosis not present

## 2022-12-01 DIAGNOSIS — E1151 Type 2 diabetes mellitus with diabetic peripheral angiopathy without gangrene: Secondary | ICD-10-CM | POA: Diagnosis not present

## 2022-12-01 DIAGNOSIS — I5032 Chronic diastolic (congestive) heart failure: Secondary | ICD-10-CM | POA: Diagnosis not present

## 2022-12-01 DIAGNOSIS — E785 Hyperlipidemia, unspecified: Secondary | ICD-10-CM | POA: Diagnosis not present

## 2022-12-01 DIAGNOSIS — J441 Chronic obstructive pulmonary disease with (acute) exacerbation: Secondary | ICD-10-CM | POA: Diagnosis not present

## 2022-12-06 ENCOUNTER — Other Ambulatory Visit: Payer: Self-pay | Admitting: *Deleted

## 2022-12-06 DIAGNOSIS — J9601 Acute respiratory failure with hypoxia: Secondary | ICD-10-CM | POA: Diagnosis not present

## 2022-12-06 DIAGNOSIS — J441 Chronic obstructive pulmonary disease with (acute) exacerbation: Secondary | ICD-10-CM | POA: Diagnosis not present

## 2022-12-06 DIAGNOSIS — I5032 Chronic diastolic (congestive) heart failure: Secondary | ICD-10-CM | POA: Diagnosis not present

## 2022-12-06 DIAGNOSIS — J4551 Severe persistent asthma with (acute) exacerbation: Secondary | ICD-10-CM | POA: Diagnosis not present

## 2022-12-06 DIAGNOSIS — L03116 Cellulitis of left lower limb: Secondary | ICD-10-CM | POA: Diagnosis not present

## 2022-12-06 DIAGNOSIS — I110 Hypertensive heart disease with heart failure: Secondary | ICD-10-CM | POA: Diagnosis not present

## 2022-12-06 NOTE — Patient Outreach (Signed)
Per [facility] Mr. Soter discharged from [facility] skilled nursing facility on 11/30/22. Screening for potential [facility] care coordination services as benefit of health plan and  Primary Care Provider.  Per Stasia Cavalier discharge planner, Mr. Dunbar transitioned to [facility] with [facility] home health.   No identifiable [facility] care coordination services.   Raiford Noble, MSN, RN,BSN [facility] Post Acute Care Coordinator [phone] (Direct dial)

## 2022-12-07 DIAGNOSIS — J9690 Respiratory failure, unspecified, unspecified whether with hypoxia or hypercapnia: Secondary | ICD-10-CM | POA: Diagnosis not present

## 2022-12-07 DIAGNOSIS — M6281 Muscle weakness (generalized): Secondary | ICD-10-CM | POA: Diagnosis not present

## 2022-12-07 DIAGNOSIS — L03116 Cellulitis of left lower limb: Secondary | ICD-10-CM | POA: Diagnosis not present

## 2022-12-07 DIAGNOSIS — J441 Chronic obstructive pulmonary disease with (acute) exacerbation: Secondary | ICD-10-CM | POA: Diagnosis not present

## 2022-12-07 DIAGNOSIS — E1159 Type 2 diabetes mellitus with other circulatory complications: Secondary | ICD-10-CM | POA: Diagnosis not present

## 2022-12-07 DIAGNOSIS — J449 Chronic obstructive pulmonary disease, unspecified: Secondary | ICD-10-CM | POA: Diagnosis not present

## 2022-12-07 DIAGNOSIS — J4551 Severe persistent asthma with (acute) exacerbation: Secondary | ICD-10-CM | POA: Diagnosis not present

## 2022-12-07 DIAGNOSIS — R1310 Dysphagia, unspecified: Secondary | ICD-10-CM | POA: Diagnosis not present

## 2022-12-07 DIAGNOSIS — I5032 Chronic diastolic (congestive) heart failure: Secondary | ICD-10-CM | POA: Diagnosis not present

## 2022-12-07 DIAGNOSIS — K219 Gastro-esophageal reflux disease without esophagitis: Secondary | ICD-10-CM | POA: Diagnosis not present

## 2022-12-07 DIAGNOSIS — I110 Hypertensive heart disease with heart failure: Secondary | ICD-10-CM | POA: Diagnosis not present

## 2022-12-07 DIAGNOSIS — L03115 Cellulitis of right lower limb: Secondary | ICD-10-CM | POA: Diagnosis not present

## 2022-12-07 DIAGNOSIS — R600 Localized edema: Secondary | ICD-10-CM | POA: Diagnosis not present

## 2022-12-07 DIAGNOSIS — F339 Major depressive disorder, recurrent, unspecified: Secondary | ICD-10-CM | POA: Diagnosis not present

## 2022-12-07 DIAGNOSIS — I509 Heart failure, unspecified: Secondary | ICD-10-CM | POA: Diagnosis not present

## 2022-12-07 DIAGNOSIS — J9601 Acute respiratory failure with hypoxia: Secondary | ICD-10-CM | POA: Diagnosis not present

## 2022-12-07 DIAGNOSIS — N400 Enlarged prostate without lower urinary tract symptoms: Secondary | ICD-10-CM | POA: Diagnosis not present

## 2022-12-12 DIAGNOSIS — J441 Chronic obstructive pulmonary disease with (acute) exacerbation: Secondary | ICD-10-CM | POA: Diagnosis not present

## 2022-12-12 DIAGNOSIS — L03116 Cellulitis of left lower limb: Secondary | ICD-10-CM | POA: Diagnosis not present

## 2022-12-12 DIAGNOSIS — J4551 Severe persistent asthma with (acute) exacerbation: Secondary | ICD-10-CM | POA: Diagnosis not present

## 2022-12-12 DIAGNOSIS — J9601 Acute respiratory failure with hypoxia: Secondary | ICD-10-CM | POA: Diagnosis not present

## 2022-12-12 DIAGNOSIS — I5032 Chronic diastolic (congestive) heart failure: Secondary | ICD-10-CM | POA: Diagnosis not present

## 2022-12-12 DIAGNOSIS — I110 Hypertensive heart disease with heart failure: Secondary | ICD-10-CM | POA: Diagnosis not present

## 2022-12-13 DIAGNOSIS — L03116 Cellulitis of left lower limb: Secondary | ICD-10-CM | POA: Diagnosis not present

## 2022-12-13 DIAGNOSIS — J4551 Severe persistent asthma with (acute) exacerbation: Secondary | ICD-10-CM | POA: Diagnosis not present

## 2022-12-13 DIAGNOSIS — I110 Hypertensive heart disease with heart failure: Secondary | ICD-10-CM | POA: Diagnosis not present

## 2022-12-13 DIAGNOSIS — I5032 Chronic diastolic (congestive) heart failure: Secondary | ICD-10-CM | POA: Diagnosis not present

## 2022-12-13 DIAGNOSIS — J441 Chronic obstructive pulmonary disease with (acute) exacerbation: Secondary | ICD-10-CM | POA: Diagnosis not present

## 2022-12-13 DIAGNOSIS — J9601 Acute respiratory failure with hypoxia: Secondary | ICD-10-CM | POA: Diagnosis not present

## 2022-12-14 DIAGNOSIS — R609 Edema, unspecified: Secondary | ICD-10-CM | POA: Diagnosis not present

## 2022-12-14 DIAGNOSIS — M25571 Pain in right ankle and joints of right foot: Secondary | ICD-10-CM | POA: Diagnosis not present

## 2022-12-14 DIAGNOSIS — L03115 Cellulitis of right lower limb: Secondary | ICD-10-CM | POA: Diagnosis not present

## 2022-12-14 DIAGNOSIS — M25572 Pain in left ankle and joints of left foot: Secondary | ICD-10-CM | POA: Diagnosis not present

## 2022-12-15 DIAGNOSIS — J441 Chronic obstructive pulmonary disease with (acute) exacerbation: Secondary | ICD-10-CM | POA: Diagnosis not present

## 2022-12-15 DIAGNOSIS — I5032 Chronic diastolic (congestive) heart failure: Secondary | ICD-10-CM | POA: Diagnosis not present

## 2022-12-15 DIAGNOSIS — J9601 Acute respiratory failure with hypoxia: Secondary | ICD-10-CM | POA: Diagnosis not present

## 2022-12-15 DIAGNOSIS — J4551 Severe persistent asthma with (acute) exacerbation: Secondary | ICD-10-CM | POA: Diagnosis not present

## 2022-12-15 DIAGNOSIS — L03116 Cellulitis of left lower limb: Secondary | ICD-10-CM | POA: Diagnosis not present

## 2022-12-15 DIAGNOSIS — I110 Hypertensive heart disease with heart failure: Secondary | ICD-10-CM | POA: Diagnosis not present

## 2022-12-19 ENCOUNTER — Telehealth: Payer: Self-pay | Admitting: Family Medicine

## 2022-12-19 DIAGNOSIS — J4551 Severe persistent asthma with (acute) exacerbation: Secondary | ICD-10-CM | POA: Diagnosis not present

## 2022-12-19 DIAGNOSIS — L03116 Cellulitis of left lower limb: Secondary | ICD-10-CM | POA: Diagnosis not present

## 2022-12-19 DIAGNOSIS — J9601 Acute respiratory failure with hypoxia: Secondary | ICD-10-CM | POA: Diagnosis not present

## 2022-12-19 DIAGNOSIS — I110 Hypertensive heart disease with heart failure: Secondary | ICD-10-CM | POA: Diagnosis not present

## 2022-12-19 DIAGNOSIS — J441 Chronic obstructive pulmonary disease with (acute) exacerbation: Secondary | ICD-10-CM | POA: Diagnosis not present

## 2022-12-19 DIAGNOSIS — I5032 Chronic diastolic (congestive) heart failure: Secondary | ICD-10-CM | POA: Diagnosis not present

## 2022-12-19 NOTE — Telephone Encounter (Signed)
Cody Bridges from [facility] called stating pt is complaining of pain which is 8 out of 10 and he did not want to take tylenol prn. Cody Bridges stated both of his ankles are swollen. Cody Bridges stated pt need compression wraps for right lower extremity and jennifer want to see if the provider can oder bilateral lower extremities wrap both legs

## 2022-12-20 NOTE — Telephone Encounter (Signed)
I have not seen him since November of last year. He needs an appointment for evaluation if he is having that much pain and that much swelling. If this has been going on for a while he can be scheduled to see me or another provider in the office this week. If this is new he needs to be seen today and if there are no available appointments he should go to [facility] for evaluation.

## 2022-12-20 NOTE — Telephone Encounter (Signed)
Spoke with Victorino Dike at [facility] regarding Dr. Purvis Sheffield recommendations for Cody Bridges. Victorino Dike understands and is agreeable.

## 2022-12-21 ENCOUNTER — Ambulatory Visit (INDEPENDENT_AMBULATORY_CARE_PROVIDER_SITE_OTHER): Payer: Medicare Other

## 2022-12-21 DIAGNOSIS — L209 Atopic dermatitis, unspecified: Secondary | ICD-10-CM

## 2022-12-21 MED ORDER — DUPILUMAB 300 MG/2ML ~~LOC~~ SOSY
300.0000 mg | PREFILLED_SYRINGE | Freq: Once | SUBCUTANEOUS | Status: AC
Start: 2022-12-21 — End: 2022-12-21
  Administered 2022-12-21: 300 mg via SUBCUTANEOUS

## 2022-12-21 NOTE — Telephone Encounter (Signed)
Called Patient to find out if he has been evaluated like I advised Victorino Dike from [facility].

## 2022-12-21 NOTE — Progress Notes (Signed)
Patient here today for two week Dupixent injection for severe atopic dermatitis. Dupixent 300mg /81mL injected into left upper arm. Patient tolerated well.    LOT: [id number] EXP: 12/2024 NGE:[id number]   Cody Bridges, RMA

## 2022-12-22 DIAGNOSIS — J4551 Severe persistent asthma with (acute) exacerbation: Secondary | ICD-10-CM | POA: Diagnosis not present

## 2022-12-22 DIAGNOSIS — J441 Chronic obstructive pulmonary disease with (acute) exacerbation: Secondary | ICD-10-CM | POA: Diagnosis not present

## 2022-12-22 DIAGNOSIS — L03116 Cellulitis of left lower limb: Secondary | ICD-10-CM | POA: Diagnosis not present

## 2022-12-22 DIAGNOSIS — I5032 Chronic diastolic (congestive) heart failure: Secondary | ICD-10-CM | POA: Diagnosis not present

## 2022-12-22 DIAGNOSIS — J9601 Acute respiratory failure with hypoxia: Secondary | ICD-10-CM | POA: Diagnosis not present

## 2022-12-22 DIAGNOSIS — I110 Hypertensive heart disease with heart failure: Secondary | ICD-10-CM | POA: Diagnosis not present

## 2022-12-22 NOTE — Telephone Encounter (Signed)
Spoke to Patient he states his ankles are swollen and both legs are hurting. Spoke to [place] at [facility] she states the Patient has been placed in [place] and that Smiley Houseman, NP saw the Patient yesterday and ordered leg wraps 2-3 x a week and that she will be the home health nurse that will wrap his legs. Then I spoke to [place] at [facility] and the Patient was placed there after coming out of [facility] in [place] on 12/01/22. Misty Stanley states the family did sign for Smiley Houseman to provide care to him if need be.

## 2022-12-22 NOTE — Telephone Encounter (Signed)
Was the patient evaluated?

## 2022-12-23 NOTE — Telephone Encounter (Signed)
Noted  

## 2022-12-27 DIAGNOSIS — J441 Chronic obstructive pulmonary disease with (acute) exacerbation: Secondary | ICD-10-CM | POA: Diagnosis not present

## 2022-12-27 DIAGNOSIS — J4551 Severe persistent asthma with (acute) exacerbation: Secondary | ICD-10-CM | POA: Diagnosis not present

## 2022-12-27 DIAGNOSIS — J9601 Acute respiratory failure with hypoxia: Secondary | ICD-10-CM | POA: Diagnosis not present

## 2022-12-27 DIAGNOSIS — I5032 Chronic diastolic (congestive) heart failure: Secondary | ICD-10-CM | POA: Diagnosis not present

## 2022-12-27 DIAGNOSIS — L03116 Cellulitis of left lower limb: Secondary | ICD-10-CM | POA: Diagnosis not present

## 2022-12-27 DIAGNOSIS — I110 Hypertensive heart disease with heart failure: Secondary | ICD-10-CM | POA: Diagnosis not present

## 2022-12-28 DIAGNOSIS — I5032 Chronic diastolic (congestive) heart failure: Secondary | ICD-10-CM | POA: Diagnosis not present

## 2022-12-28 DIAGNOSIS — L03116 Cellulitis of left lower limb: Secondary | ICD-10-CM | POA: Diagnosis not present

## 2022-12-28 DIAGNOSIS — I110 Hypertensive heart disease with heart failure: Secondary | ICD-10-CM | POA: Diagnosis not present

## 2022-12-28 DIAGNOSIS — J4551 Severe persistent asthma with (acute) exacerbation: Secondary | ICD-10-CM | POA: Diagnosis not present

## 2022-12-28 DIAGNOSIS — J9601 Acute respiratory failure with hypoxia: Secondary | ICD-10-CM | POA: Diagnosis not present

## 2022-12-28 DIAGNOSIS — J441 Chronic obstructive pulmonary disease with (acute) exacerbation: Secondary | ICD-10-CM | POA: Diagnosis not present

## 2022-12-31 DIAGNOSIS — I110 Hypertensive heart disease with heart failure: Secondary | ICD-10-CM | POA: Diagnosis not present

## 2022-12-31 DIAGNOSIS — H409 Unspecified glaucoma: Secondary | ICD-10-CM | POA: Diagnosis not present

## 2022-12-31 DIAGNOSIS — K219 Gastro-esophageal reflux disease without esophagitis: Secondary | ICD-10-CM | POA: Diagnosis not present

## 2022-12-31 DIAGNOSIS — J441 Chronic obstructive pulmonary disease with (acute) exacerbation: Secondary | ICD-10-CM | POA: Diagnosis not present

## 2022-12-31 DIAGNOSIS — E46 Unspecified protein-calorie malnutrition: Secondary | ICD-10-CM | POA: Diagnosis not present

## 2022-12-31 DIAGNOSIS — I5032 Chronic diastolic (congestive) heart failure: Secondary | ICD-10-CM | POA: Diagnosis not present

## 2022-12-31 DIAGNOSIS — H04123 Dry eye syndrome of bilateral lacrimal glands: Secondary | ICD-10-CM | POA: Diagnosis not present

## 2022-12-31 DIAGNOSIS — Z9181 History of falling: Secondary | ICD-10-CM | POA: Diagnosis not present

## 2022-12-31 DIAGNOSIS — E1151 Type 2 diabetes mellitus with diabetic peripheral angiopathy without gangrene: Secondary | ICD-10-CM | POA: Diagnosis not present

## 2022-12-31 DIAGNOSIS — Z7952 Long term (current) use of systemic steroids: Secondary | ICD-10-CM | POA: Diagnosis not present

## 2022-12-31 DIAGNOSIS — E785 Hyperlipidemia, unspecified: Secondary | ICD-10-CM | POA: Diagnosis not present

## 2022-12-31 DIAGNOSIS — H9190 Unspecified hearing loss, unspecified ear: Secondary | ICD-10-CM | POA: Diagnosis not present

## 2022-12-31 DIAGNOSIS — L03116 Cellulitis of left lower limb: Secondary | ICD-10-CM | POA: Diagnosis not present

## 2022-12-31 DIAGNOSIS — K5900 Constipation, unspecified: Secondary | ICD-10-CM | POA: Diagnosis not present

## 2022-12-31 DIAGNOSIS — N400 Enlarged prostate without lower urinary tract symptoms: Secondary | ICD-10-CM | POA: Diagnosis not present

## 2022-12-31 DIAGNOSIS — J4551 Severe persistent asthma with (acute) exacerbation: Secondary | ICD-10-CM | POA: Diagnosis not present

## 2022-12-31 DIAGNOSIS — Z7951 Long term (current) use of inhaled steroids: Secondary | ICD-10-CM | POA: Diagnosis not present

## 2022-12-31 DIAGNOSIS — E559 Vitamin D deficiency, unspecified: Secondary | ICD-10-CM | POA: Diagnosis not present

## 2022-12-31 DIAGNOSIS — L309 Dermatitis, unspecified: Secondary | ICD-10-CM | POA: Diagnosis not present

## 2022-12-31 DIAGNOSIS — Z7984 Long term (current) use of oral hypoglycemic drugs: Secondary | ICD-10-CM | POA: Diagnosis not present

## 2022-12-31 DIAGNOSIS — J9601 Acute respiratory failure with hypoxia: Secondary | ICD-10-CM | POA: Diagnosis not present

## 2022-12-31 DIAGNOSIS — H3530 Unspecified macular degeneration: Secondary | ICD-10-CM | POA: Diagnosis not present

## 2023-01-02 DIAGNOSIS — L03116 Cellulitis of left lower limb: Secondary | ICD-10-CM | POA: Diagnosis not present

## 2023-01-02 DIAGNOSIS — J449 Chronic obstructive pulmonary disease, unspecified: Secondary | ICD-10-CM | POA: Diagnosis not present

## 2023-01-02 DIAGNOSIS — R918 Other nonspecific abnormal finding of lung field: Secondary | ICD-10-CM | POA: Diagnosis not present

## 2023-01-02 DIAGNOSIS — J441 Chronic obstructive pulmonary disease with (acute) exacerbation: Secondary | ICD-10-CM | POA: Diagnosis not present

## 2023-01-02 DIAGNOSIS — J301 Allergic rhinitis due to pollen: Secondary | ICD-10-CM | POA: Diagnosis not present

## 2023-01-02 DIAGNOSIS — I110 Hypertensive heart disease with heart failure: Secondary | ICD-10-CM | POA: Diagnosis not present

## 2023-01-02 DIAGNOSIS — I5032 Chronic diastolic (congestive) heart failure: Secondary | ICD-10-CM | POA: Diagnosis not present

## 2023-01-02 DIAGNOSIS — R0602 Shortness of breath: Secondary | ICD-10-CM | POA: Diagnosis not present

## 2023-01-02 DIAGNOSIS — J4551 Severe persistent asthma with (acute) exacerbation: Secondary | ICD-10-CM | POA: Diagnosis not present

## 2023-01-02 DIAGNOSIS — J9601 Acute respiratory failure with hypoxia: Secondary | ICD-10-CM | POA: Diagnosis not present

## 2023-01-04 ENCOUNTER — Ambulatory Visit (INDEPENDENT_AMBULATORY_CARE_PROVIDER_SITE_OTHER): Payer: Medicare Other

## 2023-01-04 DIAGNOSIS — R269 Unspecified abnormalities of gait and mobility: Secondary | ICD-10-CM | POA: Diagnosis not present

## 2023-01-04 DIAGNOSIS — I5032 Chronic diastolic (congestive) heart failure: Secondary | ICD-10-CM | POA: Diagnosis not present

## 2023-01-04 DIAGNOSIS — E1122 Type 2 diabetes mellitus with diabetic chronic kidney disease: Secondary | ICD-10-CM | POA: Diagnosis not present

## 2023-01-04 DIAGNOSIS — J4551 Severe persistent asthma with (acute) exacerbation: Secondary | ICD-10-CM | POA: Diagnosis not present

## 2023-01-04 DIAGNOSIS — E559 Vitamin D deficiency, unspecified: Secondary | ICD-10-CM | POA: Diagnosis not present

## 2023-01-04 DIAGNOSIS — I110 Hypertensive heart disease with heart failure: Secondary | ICD-10-CM | POA: Diagnosis not present

## 2023-01-04 DIAGNOSIS — J449 Chronic obstructive pulmonary disease, unspecified: Secondary | ICD-10-CM | POA: Diagnosis not present

## 2023-01-04 DIAGNOSIS — E781 Pure hyperglyceridemia: Secondary | ICD-10-CM | POA: Diagnosis not present

## 2023-01-04 DIAGNOSIS — I509 Heart failure, unspecified: Secondary | ICD-10-CM | POA: Diagnosis not present

## 2023-01-04 DIAGNOSIS — L209 Atopic dermatitis, unspecified: Secondary | ICD-10-CM

## 2023-01-04 DIAGNOSIS — I130 Hypertensive heart and chronic kidney disease with heart failure and stage 1 through stage 4 chronic kidney disease, or unspecified chronic kidney disease: Secondary | ICD-10-CM | POA: Diagnosis not present

## 2023-01-04 DIAGNOSIS — N182 Chronic kidney disease, stage 2 (mild): Secondary | ICD-10-CM | POA: Diagnosis not present

## 2023-01-04 DIAGNOSIS — L03116 Cellulitis of left lower limb: Secondary | ICD-10-CM | POA: Diagnosis not present

## 2023-01-04 DIAGNOSIS — F331 Major depressive disorder, recurrent, moderate: Secondary | ICD-10-CM | POA: Diagnosis not present

## 2023-01-04 DIAGNOSIS — J441 Chronic obstructive pulmonary disease with (acute) exacerbation: Secondary | ICD-10-CM | POA: Diagnosis not present

## 2023-01-04 DIAGNOSIS — D649 Anemia, unspecified: Secondary | ICD-10-CM | POA: Diagnosis not present

## 2023-01-04 DIAGNOSIS — J9601 Acute respiratory failure with hypoxia: Secondary | ICD-10-CM | POA: Diagnosis not present

## 2023-01-04 MED ORDER — DUPILUMAB 300 MG/2ML ~~LOC~~ SOSY
300.0000 mg | PREFILLED_SYRINGE | Freq: Once | SUBCUTANEOUS | Status: AC
Start: 2023-01-04 — End: 2023-01-04
  Administered 2023-01-04: 300 mg via SUBCUTANEOUS

## 2023-01-04 NOTE — Progress Notes (Signed)
Patient here today for two week Dupixent injection for severe atopic dermatitis.  Dupixent 300mg /59mL injected into right upper arm. Patient tolerated well.    LOT: [id number] EXP: 03/2025 EAV:[id number]   Dorathy Daft, RMA

## 2023-01-05 DIAGNOSIS — J9601 Acute respiratory failure with hypoxia: Secondary | ICD-10-CM | POA: Diagnosis not present

## 2023-01-05 DIAGNOSIS — J441 Chronic obstructive pulmonary disease with (acute) exacerbation: Secondary | ICD-10-CM | POA: Diagnosis not present

## 2023-01-05 DIAGNOSIS — J4551 Severe persistent asthma with (acute) exacerbation: Secondary | ICD-10-CM | POA: Diagnosis not present

## 2023-01-05 DIAGNOSIS — I110 Hypertensive heart disease with heart failure: Secondary | ICD-10-CM | POA: Diagnosis not present

## 2023-01-05 DIAGNOSIS — I5032 Chronic diastolic (congestive) heart failure: Secondary | ICD-10-CM | POA: Diagnosis not present

## 2023-01-05 DIAGNOSIS — L03116 Cellulitis of left lower limb: Secondary | ICD-10-CM | POA: Diagnosis not present

## 2023-01-06 DIAGNOSIS — J449 Chronic obstructive pulmonary disease, unspecified: Secondary | ICD-10-CM | POA: Diagnosis not present

## 2023-01-06 DIAGNOSIS — I509 Heart failure, unspecified: Secondary | ICD-10-CM | POA: Diagnosis not present

## 2023-01-06 DIAGNOSIS — E1122 Type 2 diabetes mellitus with diabetic chronic kidney disease: Secondary | ICD-10-CM | POA: Diagnosis not present

## 2023-01-06 DIAGNOSIS — I10 Essential (primary) hypertension: Secondary | ICD-10-CM | POA: Diagnosis not present

## 2023-01-09 DIAGNOSIS — Z961 Presence of intraocular lens: Secondary | ICD-10-CM | POA: Diagnosis not present

## 2023-01-09 DIAGNOSIS — H40003 Preglaucoma, unspecified, bilateral: Secondary | ICD-10-CM | POA: Diagnosis not present

## 2023-01-09 DIAGNOSIS — H353211 Exudative age-related macular degeneration, right eye, with active choroidal neovascularization: Secondary | ICD-10-CM | POA: Diagnosis not present

## 2023-01-10 DIAGNOSIS — L03116 Cellulitis of left lower limb: Secondary | ICD-10-CM | POA: Diagnosis not present

## 2023-01-10 DIAGNOSIS — J441 Chronic obstructive pulmonary disease with (acute) exacerbation: Secondary | ICD-10-CM | POA: Diagnosis not present

## 2023-01-10 DIAGNOSIS — J4551 Severe persistent asthma with (acute) exacerbation: Secondary | ICD-10-CM | POA: Diagnosis not present

## 2023-01-10 DIAGNOSIS — J9601 Acute respiratory failure with hypoxia: Secondary | ICD-10-CM | POA: Diagnosis not present

## 2023-01-10 DIAGNOSIS — I110 Hypertensive heart disease with heart failure: Secondary | ICD-10-CM | POA: Diagnosis not present

## 2023-01-10 DIAGNOSIS — I5032 Chronic diastolic (congestive) heart failure: Secondary | ICD-10-CM | POA: Diagnosis not present

## 2023-01-11 ENCOUNTER — Other Ambulatory Visit: Payer: Self-pay | Admitting: Family Medicine

## 2023-01-11 DIAGNOSIS — J9601 Acute respiratory failure with hypoxia: Secondary | ICD-10-CM | POA: Diagnosis not present

## 2023-01-11 DIAGNOSIS — J4551 Severe persistent asthma with (acute) exacerbation: Secondary | ICD-10-CM | POA: Diagnosis not present

## 2023-01-11 DIAGNOSIS — I110 Hypertensive heart disease with heart failure: Secondary | ICD-10-CM | POA: Diagnosis not present

## 2023-01-11 DIAGNOSIS — J441 Chronic obstructive pulmonary disease with (acute) exacerbation: Secondary | ICD-10-CM | POA: Diagnosis not present

## 2023-01-11 DIAGNOSIS — I5032 Chronic diastolic (congestive) heart failure: Secondary | ICD-10-CM | POA: Diagnosis not present

## 2023-01-11 DIAGNOSIS — L03116 Cellulitis of left lower limb: Secondary | ICD-10-CM | POA: Diagnosis not present

## 2023-01-16 DIAGNOSIS — I110 Hypertensive heart disease with heart failure: Secondary | ICD-10-CM | POA: Diagnosis not present

## 2023-01-16 DIAGNOSIS — L03116 Cellulitis of left lower limb: Secondary | ICD-10-CM | POA: Diagnosis not present

## 2023-01-16 DIAGNOSIS — J441 Chronic obstructive pulmonary disease with (acute) exacerbation: Secondary | ICD-10-CM | POA: Diagnosis not present

## 2023-01-16 DIAGNOSIS — J9601 Acute respiratory failure with hypoxia: Secondary | ICD-10-CM | POA: Diagnosis not present

## 2023-01-16 DIAGNOSIS — J4551 Severe persistent asthma with (acute) exacerbation: Secondary | ICD-10-CM | POA: Diagnosis not present

## 2023-01-16 DIAGNOSIS — I5032 Chronic diastolic (congestive) heart failure: Secondary | ICD-10-CM | POA: Diagnosis not present

## 2023-01-17 DIAGNOSIS — J9601 Acute respiratory failure with hypoxia: Secondary | ICD-10-CM | POA: Diagnosis not present

## 2023-01-17 DIAGNOSIS — L03116 Cellulitis of left lower limb: Secondary | ICD-10-CM | POA: Diagnosis not present

## 2023-01-17 DIAGNOSIS — I5032 Chronic diastolic (congestive) heart failure: Secondary | ICD-10-CM | POA: Diagnosis not present

## 2023-01-17 DIAGNOSIS — J441 Chronic obstructive pulmonary disease with (acute) exacerbation: Secondary | ICD-10-CM | POA: Diagnosis not present

## 2023-01-17 DIAGNOSIS — J4551 Severe persistent asthma with (acute) exacerbation: Secondary | ICD-10-CM | POA: Diagnosis not present

## 2023-01-17 DIAGNOSIS — I110 Hypertensive heart disease with heart failure: Secondary | ICD-10-CM | POA: Diagnosis not present

## 2023-01-18 ENCOUNTER — Ambulatory Visit (INDEPENDENT_AMBULATORY_CARE_PROVIDER_SITE_OTHER): Payer: Medicare Other

## 2023-01-18 DIAGNOSIS — L209 Atopic dermatitis, unspecified: Secondary | ICD-10-CM | POA: Diagnosis not present

## 2023-01-18 MED ORDER — DUPILUMAB 300 MG/2ML ~~LOC~~ SOSY
300.0000 mg | PREFILLED_SYRINGE | Freq: Once | SUBCUTANEOUS | Status: AC
Start: 2023-01-18 — End: 2023-01-18
  Administered 2023-01-18: 300 mg via SUBCUTANEOUS

## 2023-01-18 NOTE — Progress Notes (Signed)
Patient here today for two week Dupixent injection for severe atopic dermatitis.  Dupixent 300mg /5mL injected into left upper arm. Patient tolerated well.    LOT: [id number] EXP: 10/2024 EAV:[id number]   Dorathy Daft, RMA

## 2023-01-24 DIAGNOSIS — J9601 Acute respiratory failure with hypoxia: Secondary | ICD-10-CM | POA: Diagnosis not present

## 2023-01-24 DIAGNOSIS — I5032 Chronic diastolic (congestive) heart failure: Secondary | ICD-10-CM | POA: Diagnosis not present

## 2023-01-24 DIAGNOSIS — J4551 Severe persistent asthma with (acute) exacerbation: Secondary | ICD-10-CM | POA: Diagnosis not present

## 2023-01-24 DIAGNOSIS — J441 Chronic obstructive pulmonary disease with (acute) exacerbation: Secondary | ICD-10-CM | POA: Diagnosis not present

## 2023-01-24 DIAGNOSIS — L03116 Cellulitis of left lower limb: Secondary | ICD-10-CM | POA: Diagnosis not present

## 2023-01-24 DIAGNOSIS — I110 Hypertensive heart disease with heart failure: Secondary | ICD-10-CM | POA: Diagnosis not present

## 2023-01-25 DIAGNOSIS — E785 Hyperlipidemia, unspecified: Secondary | ICD-10-CM | POA: Diagnosis not present

## 2023-01-25 DIAGNOSIS — N400 Enlarged prostate without lower urinary tract symptoms: Secondary | ICD-10-CM | POA: Diagnosis not present

## 2023-01-25 DIAGNOSIS — I10 Essential (primary) hypertension: Secondary | ICD-10-CM | POA: Diagnosis not present

## 2023-01-25 DIAGNOSIS — R609 Edema, unspecified: Secondary | ICD-10-CM | POA: Diagnosis not present

## 2023-01-25 DIAGNOSIS — I130 Hypertensive heart and chronic kidney disease with heart failure and stage 1 through stage 4 chronic kidney disease, or unspecified chronic kidney disease: Secondary | ICD-10-CM | POA: Diagnosis not present

## 2023-01-25 DIAGNOSIS — Z9181 History of falling: Secondary | ICD-10-CM | POA: Diagnosis not present

## 2023-01-27 DIAGNOSIS — I10 Essential (primary) hypertension: Secondary | ICD-10-CM | POA: Diagnosis not present

## 2023-01-27 DIAGNOSIS — E118 Type 2 diabetes mellitus with unspecified complications: Secondary | ICD-10-CM | POA: Diagnosis not present

## 2023-01-30 DIAGNOSIS — I5032 Chronic diastolic (congestive) heart failure: Secondary | ICD-10-CM | POA: Diagnosis not present

## 2023-01-30 DIAGNOSIS — K5900 Constipation, unspecified: Secondary | ICD-10-CM | POA: Diagnosis not present

## 2023-01-30 DIAGNOSIS — N400 Enlarged prostate without lower urinary tract symptoms: Secondary | ICD-10-CM | POA: Diagnosis not present

## 2023-01-30 DIAGNOSIS — H409 Unspecified glaucoma: Secondary | ICD-10-CM | POA: Diagnosis not present

## 2023-01-30 DIAGNOSIS — Z7951 Long term (current) use of inhaled steroids: Secondary | ICD-10-CM | POA: Diagnosis not present

## 2023-01-30 DIAGNOSIS — Z9181 History of falling: Secondary | ICD-10-CM | POA: Diagnosis not present

## 2023-01-30 DIAGNOSIS — K219 Gastro-esophageal reflux disease without esophagitis: Secondary | ICD-10-CM | POA: Diagnosis not present

## 2023-01-30 DIAGNOSIS — H9190 Unspecified hearing loss, unspecified ear: Secondary | ICD-10-CM | POA: Diagnosis not present

## 2023-01-30 DIAGNOSIS — E46 Unspecified protein-calorie malnutrition: Secondary | ICD-10-CM | POA: Diagnosis not present

## 2023-01-30 DIAGNOSIS — J9601 Acute respiratory failure with hypoxia: Secondary | ICD-10-CM | POA: Diagnosis not present

## 2023-01-30 DIAGNOSIS — J4551 Severe persistent asthma with (acute) exacerbation: Secondary | ICD-10-CM | POA: Diagnosis not present

## 2023-01-30 DIAGNOSIS — L309 Dermatitis, unspecified: Secondary | ICD-10-CM | POA: Diagnosis not present

## 2023-01-30 DIAGNOSIS — E559 Vitamin D deficiency, unspecified: Secondary | ICD-10-CM | POA: Diagnosis not present

## 2023-01-30 DIAGNOSIS — Z7984 Long term (current) use of oral hypoglycemic drugs: Secondary | ICD-10-CM | POA: Diagnosis not present

## 2023-01-30 DIAGNOSIS — H04123 Dry eye syndrome of bilateral lacrimal glands: Secondary | ICD-10-CM | POA: Diagnosis not present

## 2023-01-30 DIAGNOSIS — J441 Chronic obstructive pulmonary disease with (acute) exacerbation: Secondary | ICD-10-CM | POA: Diagnosis not present

## 2023-01-30 DIAGNOSIS — H3530 Unspecified macular degeneration: Secondary | ICD-10-CM | POA: Diagnosis not present

## 2023-01-30 DIAGNOSIS — Z7952 Long term (current) use of systemic steroids: Secondary | ICD-10-CM | POA: Diagnosis not present

## 2023-01-30 DIAGNOSIS — E785 Hyperlipidemia, unspecified: Secondary | ICD-10-CM | POA: Diagnosis not present

## 2023-01-30 DIAGNOSIS — I110 Hypertensive heart disease with heart failure: Secondary | ICD-10-CM | POA: Diagnosis not present

## 2023-01-30 DIAGNOSIS — E1151 Type 2 diabetes mellitus with diabetic peripheral angiopathy without gangrene: Secondary | ICD-10-CM | POA: Diagnosis not present

## 2023-01-31 DIAGNOSIS — I509 Heart failure, unspecified: Secondary | ICD-10-CM | POA: Diagnosis not present

## 2023-01-31 DIAGNOSIS — Z719 Counseling, unspecified: Secondary | ICD-10-CM | POA: Diagnosis not present

## 2023-01-31 DIAGNOSIS — N182 Chronic kidney disease, stage 2 (mild): Secondary | ICD-10-CM | POA: Diagnosis not present

## 2023-01-31 DIAGNOSIS — R269 Unspecified abnormalities of gait and mobility: Secondary | ICD-10-CM | POA: Diagnosis not present

## 2023-01-31 DIAGNOSIS — J449 Chronic obstructive pulmonary disease, unspecified: Secondary | ICD-10-CM | POA: Diagnosis not present

## 2023-01-31 DIAGNOSIS — I130 Hypertensive heart and chronic kidney disease with heart failure and stage 1 through stage 4 chronic kidney disease, or unspecified chronic kidney disease: Secondary | ICD-10-CM | POA: Diagnosis not present

## 2023-02-01 ENCOUNTER — Ambulatory Visit (INDEPENDENT_AMBULATORY_CARE_PROVIDER_SITE_OTHER): Payer: Medicare Other

## 2023-02-01 DIAGNOSIS — L209 Atopic dermatitis, unspecified: Secondary | ICD-10-CM | POA: Diagnosis not present

## 2023-02-01 MED ORDER — DUPILUMAB 300 MG/2ML ~~LOC~~ SOSY
300.0000 mg | PREFILLED_SYRINGE | Freq: Once | SUBCUTANEOUS | Status: AC
Start: 2023-02-01 — End: 2023-02-01
  Administered 2023-02-01: 300 mg via SUBCUTANEOUS

## 2023-02-01 NOTE — Progress Notes (Signed)
Patient here today for two week Dupixent injection for severe atopic dermatitis.  Dupixent 300mg /61mL injected into right upper arm. Patient tolerated well.    LOT: [id number] EXP: 10/2024 EAV:[id number]   Dorathy Daft, RMA

## 2023-02-08 DIAGNOSIS — I5032 Chronic diastolic (congestive) heart failure: Secondary | ICD-10-CM | POA: Diagnosis not present

## 2023-02-08 DIAGNOSIS — E1151 Type 2 diabetes mellitus with diabetic peripheral angiopathy without gangrene: Secondary | ICD-10-CM | POA: Diagnosis not present

## 2023-02-08 DIAGNOSIS — J441 Chronic obstructive pulmonary disease with (acute) exacerbation: Secondary | ICD-10-CM | POA: Diagnosis not present

## 2023-02-08 DIAGNOSIS — J9601 Acute respiratory failure with hypoxia: Secondary | ICD-10-CM | POA: Diagnosis not present

## 2023-02-08 DIAGNOSIS — J4551 Severe persistent asthma with (acute) exacerbation: Secondary | ICD-10-CM | POA: Diagnosis not present

## 2023-02-08 DIAGNOSIS — I110 Hypertensive heart disease with heart failure: Secondary | ICD-10-CM | POA: Diagnosis not present

## 2023-02-13 DIAGNOSIS — I110 Hypertensive heart disease with heart failure: Secondary | ICD-10-CM | POA: Diagnosis not present

## 2023-02-13 DIAGNOSIS — J441 Chronic obstructive pulmonary disease with (acute) exacerbation: Secondary | ICD-10-CM | POA: Diagnosis not present

## 2023-02-13 DIAGNOSIS — J4551 Severe persistent asthma with (acute) exacerbation: Secondary | ICD-10-CM | POA: Diagnosis not present

## 2023-02-13 DIAGNOSIS — J9601 Acute respiratory failure with hypoxia: Secondary | ICD-10-CM | POA: Diagnosis not present

## 2023-02-13 DIAGNOSIS — E1151 Type 2 diabetes mellitus with diabetic peripheral angiopathy without gangrene: Secondary | ICD-10-CM | POA: Diagnosis not present

## 2023-02-13 DIAGNOSIS — I5032 Chronic diastolic (congestive) heart failure: Secondary | ICD-10-CM | POA: Diagnosis not present

## 2023-02-15 ENCOUNTER — Ambulatory Visit (INDEPENDENT_AMBULATORY_CARE_PROVIDER_SITE_OTHER): Payer: Medicare Other

## 2023-02-15 DIAGNOSIS — L209 Atopic dermatitis, unspecified: Secondary | ICD-10-CM | POA: Diagnosis not present

## 2023-02-15 MED ORDER — DUPILUMAB 300 MG/2ML ~~LOC~~ SOSY
300.0000 mg | PREFILLED_SYRINGE | Freq: Once | SUBCUTANEOUS | Status: AC
Start: 2023-02-15 — End: 2023-02-15
  Administered 2023-02-15: 300 mg via SUBCUTANEOUS

## 2023-02-15 NOTE — Progress Notes (Signed)
Patient here today for two week Dupixent injection for severe atopic dermatitis.  Dupixent 300mg /33mL injected into left upper arm. Patient tolerated well.    LOT: [id number] EXP: 10/2024 MVH:[id number]   Evorn Gong RMA

## 2023-02-17 DIAGNOSIS — Z7984 Long term (current) use of oral hypoglycemic drugs: Secondary | ICD-10-CM

## 2023-02-17 DIAGNOSIS — Z7951 Long term (current) use of inhaled steroids: Secondary | ICD-10-CM

## 2023-02-17 DIAGNOSIS — E46 Unspecified protein-calorie malnutrition: Secondary | ICD-10-CM | POA: Diagnosis not present

## 2023-02-17 DIAGNOSIS — J9601 Acute respiratory failure with hypoxia: Secondary | ICD-10-CM | POA: Diagnosis not present

## 2023-02-17 DIAGNOSIS — E785 Hyperlipidemia, unspecified: Secondary | ICD-10-CM

## 2023-02-17 DIAGNOSIS — H9190 Unspecified hearing loss, unspecified ear: Secondary | ICD-10-CM

## 2023-02-17 DIAGNOSIS — I5032 Chronic diastolic (congestive) heart failure: Secondary | ICD-10-CM | POA: Diagnosis not present

## 2023-02-17 DIAGNOSIS — L309 Dermatitis, unspecified: Secondary | ICD-10-CM

## 2023-02-17 DIAGNOSIS — K219 Gastro-esophageal reflux disease without esophagitis: Secondary | ICD-10-CM

## 2023-02-17 DIAGNOSIS — E1151 Type 2 diabetes mellitus with diabetic peripheral angiopathy without gangrene: Secondary | ICD-10-CM | POA: Diagnosis not present

## 2023-02-17 DIAGNOSIS — H3530 Unspecified macular degeneration: Secondary | ICD-10-CM | POA: Diagnosis not present

## 2023-02-17 DIAGNOSIS — J441 Chronic obstructive pulmonary disease with (acute) exacerbation: Secondary | ICD-10-CM | POA: Diagnosis not present

## 2023-02-17 DIAGNOSIS — H04123 Dry eye syndrome of bilateral lacrimal glands: Secondary | ICD-10-CM | POA: Diagnosis not present

## 2023-02-17 DIAGNOSIS — K5900 Constipation, unspecified: Secondary | ICD-10-CM | POA: Diagnosis not present

## 2023-02-17 DIAGNOSIS — E559 Vitamin D deficiency, unspecified: Secondary | ICD-10-CM | POA: Diagnosis not present

## 2023-02-17 DIAGNOSIS — H409 Unspecified glaucoma: Secondary | ICD-10-CM | POA: Diagnosis not present

## 2023-02-17 DIAGNOSIS — Z7952 Long term (current) use of systemic steroids: Secondary | ICD-10-CM

## 2023-02-17 DIAGNOSIS — Z9181 History of falling: Secondary | ICD-10-CM

## 2023-02-17 DIAGNOSIS — I110 Hypertensive heart disease with heart failure: Secondary | ICD-10-CM | POA: Diagnosis not present

## 2023-02-17 DIAGNOSIS — J4551 Severe persistent asthma with (acute) exacerbation: Secondary | ICD-10-CM | POA: Diagnosis not present

## 2023-02-17 DIAGNOSIS — N400 Enlarged prostate without lower urinary tract symptoms: Secondary | ICD-10-CM

## 2023-02-20 DIAGNOSIS — I5032 Chronic diastolic (congestive) heart failure: Secondary | ICD-10-CM | POA: Diagnosis not present

## 2023-02-20 DIAGNOSIS — J9601 Acute respiratory failure with hypoxia: Secondary | ICD-10-CM | POA: Diagnosis not present

## 2023-02-20 DIAGNOSIS — I110 Hypertensive heart disease with heart failure: Secondary | ICD-10-CM | POA: Diagnosis not present

## 2023-02-20 DIAGNOSIS — J4551 Severe persistent asthma with (acute) exacerbation: Secondary | ICD-10-CM | POA: Diagnosis not present

## 2023-02-20 DIAGNOSIS — J441 Chronic obstructive pulmonary disease with (acute) exacerbation: Secondary | ICD-10-CM | POA: Diagnosis not present

## 2023-02-20 DIAGNOSIS — E1151 Type 2 diabetes mellitus with diabetic peripheral angiopathy without gangrene: Secondary | ICD-10-CM | POA: Diagnosis not present

## 2023-02-24 DIAGNOSIS — I110 Hypertensive heart disease with heart failure: Secondary | ICD-10-CM | POA: Diagnosis not present

## 2023-02-24 DIAGNOSIS — J449 Chronic obstructive pulmonary disease, unspecified: Secondary | ICD-10-CM | POA: Diagnosis not present

## 2023-02-28 ENCOUNTER — Telehealth: Payer: Self-pay

## 2023-02-28 DIAGNOSIS — L209 Atopic dermatitis, unspecified: Secondary | ICD-10-CM

## 2023-02-28 NOTE — Telephone Encounter (Signed)
Orders needed for patient to continue Dupixent every two weeks until follow up in October. Okay to enter?

## 2023-03-01 ENCOUNTER — Ambulatory Visit (INDEPENDENT_AMBULATORY_CARE_PROVIDER_SITE_OTHER): Payer: Medicare Other

## 2023-03-01 DIAGNOSIS — E46 Unspecified protein-calorie malnutrition: Secondary | ICD-10-CM | POA: Diagnosis not present

## 2023-03-01 DIAGNOSIS — Z7984 Long term (current) use of oral hypoglycemic drugs: Secondary | ICD-10-CM | POA: Diagnosis not present

## 2023-03-01 DIAGNOSIS — E1151 Type 2 diabetes mellitus with diabetic peripheral angiopathy without gangrene: Secondary | ICD-10-CM | POA: Diagnosis not present

## 2023-03-01 DIAGNOSIS — K219 Gastro-esophageal reflux disease without esophagitis: Secondary | ICD-10-CM | POA: Diagnosis not present

## 2023-03-01 DIAGNOSIS — R269 Unspecified abnormalities of gait and mobility: Secondary | ICD-10-CM | POA: Diagnosis not present

## 2023-03-01 DIAGNOSIS — K5900 Constipation, unspecified: Secondary | ICD-10-CM | POA: Diagnosis not present

## 2023-03-01 DIAGNOSIS — J441 Chronic obstructive pulmonary disease with (acute) exacerbation: Secondary | ICD-10-CM | POA: Diagnosis not present

## 2023-03-01 DIAGNOSIS — I110 Hypertensive heart disease with heart failure: Secondary | ICD-10-CM | POA: Diagnosis not present

## 2023-03-01 DIAGNOSIS — H04123 Dry eye syndrome of bilateral lacrimal glands: Secondary | ICD-10-CM | POA: Diagnosis not present

## 2023-03-01 DIAGNOSIS — L209 Atopic dermatitis, unspecified: Secondary | ICD-10-CM

## 2023-03-01 DIAGNOSIS — E559 Vitamin D deficiency, unspecified: Secondary | ICD-10-CM | POA: Diagnosis not present

## 2023-03-01 DIAGNOSIS — Z9181 History of falling: Secondary | ICD-10-CM | POA: Diagnosis not present

## 2023-03-01 DIAGNOSIS — H3530 Unspecified macular degeneration: Secondary | ICD-10-CM | POA: Diagnosis not present

## 2023-03-01 DIAGNOSIS — F331 Major depressive disorder, recurrent, moderate: Secondary | ICD-10-CM | POA: Diagnosis not present

## 2023-03-01 DIAGNOSIS — N182 Chronic kidney disease, stage 2 (mild): Secondary | ICD-10-CM | POA: Diagnosis not present

## 2023-03-01 DIAGNOSIS — H409 Unspecified glaucoma: Secondary | ICD-10-CM | POA: Diagnosis not present

## 2023-03-01 DIAGNOSIS — I509 Heart failure, unspecified: Secondary | ICD-10-CM | POA: Diagnosis not present

## 2023-03-01 DIAGNOSIS — E785 Hyperlipidemia, unspecified: Secondary | ICD-10-CM | POA: Diagnosis not present

## 2023-03-01 DIAGNOSIS — J449 Chronic obstructive pulmonary disease, unspecified: Secondary | ICD-10-CM | POA: Diagnosis not present

## 2023-03-01 DIAGNOSIS — I130 Hypertensive heart and chronic kidney disease with heart failure and stage 1 through stage 4 chronic kidney disease, or unspecified chronic kidney disease: Secondary | ICD-10-CM | POA: Diagnosis not present

## 2023-03-01 DIAGNOSIS — J4551 Severe persistent asthma with (acute) exacerbation: Secondary | ICD-10-CM | POA: Diagnosis not present

## 2023-03-01 DIAGNOSIS — Z7952 Long term (current) use of systemic steroids: Secondary | ICD-10-CM | POA: Diagnosis not present

## 2023-03-01 DIAGNOSIS — N400 Enlarged prostate without lower urinary tract symptoms: Secondary | ICD-10-CM | POA: Diagnosis not present

## 2023-03-01 DIAGNOSIS — L309 Dermatitis, unspecified: Secondary | ICD-10-CM | POA: Diagnosis not present

## 2023-03-01 DIAGNOSIS — Z7951 Long term (current) use of inhaled steroids: Secondary | ICD-10-CM | POA: Diagnosis not present

## 2023-03-01 DIAGNOSIS — I5032 Chronic diastolic (congestive) heart failure: Secondary | ICD-10-CM | POA: Diagnosis not present

## 2023-03-01 DIAGNOSIS — H9190 Unspecified hearing loss, unspecified ear: Secondary | ICD-10-CM | POA: Diagnosis not present

## 2023-03-01 DIAGNOSIS — J9601 Acute respiratory failure with hypoxia: Secondary | ICD-10-CM | POA: Diagnosis not present

## 2023-03-01 MED ORDER — DUPILUMAB 300 MG/2ML ~~LOC~~ SOSY
300.0000 mg | PREFILLED_SYRINGE | Freq: Once | SUBCUTANEOUS | Status: AC
Start: 2023-03-01 — End: 2023-03-01
  Administered 2023-03-01: 300 mg via SUBCUTANEOUS

## 2023-03-01 MED ORDER — DUPILUMAB 300 MG/2ML ~~LOC~~ SOSY
300.0000 mg | PREFILLED_SYRINGE | SUBCUTANEOUS | Status: AC
Start: 2023-03-01 — End: 2023-05-24
  Administered 2023-03-29: 300 mg via SUBCUTANEOUS

## 2023-03-01 NOTE — Progress Notes (Signed)
Patient here today for two week Dupixent injection for severe atopic dermatitis.  Dupixent 300mg /48mL injected into right upper arm. Patient tolerated well.    LOT: [id number] EXP: 10/2024 GUY:[id number]   Evorn Gong RMA

## 2023-03-02 DIAGNOSIS — J441 Chronic obstructive pulmonary disease with (acute) exacerbation: Secondary | ICD-10-CM | POA: Diagnosis not present

## 2023-03-02 DIAGNOSIS — I110 Hypertensive heart disease with heart failure: Secondary | ICD-10-CM | POA: Diagnosis not present

## 2023-03-02 DIAGNOSIS — E1151 Type 2 diabetes mellitus with diabetic peripheral angiopathy without gangrene: Secondary | ICD-10-CM | POA: Diagnosis not present

## 2023-03-02 DIAGNOSIS — J4551 Severe persistent asthma with (acute) exacerbation: Secondary | ICD-10-CM | POA: Diagnosis not present

## 2023-03-02 DIAGNOSIS — I5032 Chronic diastolic (congestive) heart failure: Secondary | ICD-10-CM | POA: Diagnosis not present

## 2023-03-02 DIAGNOSIS — J9601 Acute respiratory failure with hypoxia: Secondary | ICD-10-CM | POA: Diagnosis not present

## 2023-03-08 DIAGNOSIS — I5032 Chronic diastolic (congestive) heart failure: Secondary | ICD-10-CM | POA: Diagnosis not present

## 2023-03-08 DIAGNOSIS — E1151 Type 2 diabetes mellitus with diabetic peripheral angiopathy without gangrene: Secondary | ICD-10-CM | POA: Diagnosis not present

## 2023-03-08 DIAGNOSIS — J9601 Acute respiratory failure with hypoxia: Secondary | ICD-10-CM | POA: Diagnosis not present

## 2023-03-08 DIAGNOSIS — J441 Chronic obstructive pulmonary disease with (acute) exacerbation: Secondary | ICD-10-CM | POA: Diagnosis not present

## 2023-03-08 DIAGNOSIS — I110 Hypertensive heart disease with heart failure: Secondary | ICD-10-CM | POA: Diagnosis not present

## 2023-03-08 DIAGNOSIS — J4551 Severe persistent asthma with (acute) exacerbation: Secondary | ICD-10-CM | POA: Diagnosis not present

## 2023-03-15 DIAGNOSIS — J441 Chronic obstructive pulmonary disease with (acute) exacerbation: Secondary | ICD-10-CM | POA: Diagnosis not present

## 2023-03-15 DIAGNOSIS — I5032 Chronic diastolic (congestive) heart failure: Secondary | ICD-10-CM | POA: Diagnosis not present

## 2023-03-15 DIAGNOSIS — E1151 Type 2 diabetes mellitus with diabetic peripheral angiopathy without gangrene: Secondary | ICD-10-CM | POA: Diagnosis not present

## 2023-03-15 DIAGNOSIS — J4551 Severe persistent asthma with (acute) exacerbation: Secondary | ICD-10-CM | POA: Diagnosis not present

## 2023-03-15 DIAGNOSIS — I110 Hypertensive heart disease with heart failure: Secondary | ICD-10-CM | POA: Diagnosis not present

## 2023-03-15 DIAGNOSIS — J9601 Acute respiratory failure with hypoxia: Secondary | ICD-10-CM | POA: Diagnosis not present

## 2023-03-17 ENCOUNTER — Encounter: Payer: Self-pay | Admitting: Family Medicine

## 2023-03-21 DIAGNOSIS — J9601 Acute respiratory failure with hypoxia: Secondary | ICD-10-CM | POA: Diagnosis not present

## 2023-03-21 DIAGNOSIS — E1151 Type 2 diabetes mellitus with diabetic peripheral angiopathy without gangrene: Secondary | ICD-10-CM | POA: Diagnosis not present

## 2023-03-21 DIAGNOSIS — I110 Hypertensive heart disease with heart failure: Secondary | ICD-10-CM | POA: Diagnosis not present

## 2023-03-21 DIAGNOSIS — J4551 Severe persistent asthma with (acute) exacerbation: Secondary | ICD-10-CM | POA: Diagnosis not present

## 2023-03-21 DIAGNOSIS — I5032 Chronic diastolic (congestive) heart failure: Secondary | ICD-10-CM | POA: Diagnosis not present

## 2023-03-21 DIAGNOSIS — J441 Chronic obstructive pulmonary disease with (acute) exacerbation: Secondary | ICD-10-CM | POA: Diagnosis not present

## 2023-03-23 ENCOUNTER — Telehealth: Payer: Self-pay | Admitting: Family Medicine

## 2023-03-23 NOTE — Telephone Encounter (Signed)
Pt daughter Rosalita Chessman called in stating that pt been at [facility] assisting leaving and has a new PCP over there. Pt stated he's no longer a Dr. Birdie Sons pt, and today she called to let us know that they received a letter showing a now show fee, and she would like to speak to someone in regards of this. She stated pt memory its not up there. Her contact # is [phone].

## 2023-03-24 NOTE — Telephone Encounter (Signed)
Left message for patient's daughter to return my call.

## 2023-03-28 DIAGNOSIS — J441 Chronic obstructive pulmonary disease with (acute) exacerbation: Secondary | ICD-10-CM | POA: Diagnosis not present

## 2023-03-28 DIAGNOSIS — J4551 Severe persistent asthma with (acute) exacerbation: Secondary | ICD-10-CM | POA: Diagnosis not present

## 2023-03-28 DIAGNOSIS — E1151 Type 2 diabetes mellitus with diabetic peripheral angiopathy without gangrene: Secondary | ICD-10-CM | POA: Diagnosis not present

## 2023-03-28 DIAGNOSIS — J9601 Acute respiratory failure with hypoxia: Secondary | ICD-10-CM | POA: Diagnosis not present

## 2023-03-28 DIAGNOSIS — I110 Hypertensive heart disease with heart failure: Secondary | ICD-10-CM | POA: Diagnosis not present

## 2023-03-28 DIAGNOSIS — I5032 Chronic diastolic (congestive) heart failure: Secondary | ICD-10-CM | POA: Diagnosis not present

## 2023-03-29 ENCOUNTER — Ambulatory Visit (INDEPENDENT_AMBULATORY_CARE_PROVIDER_SITE_OTHER): Payer: Medicare Other

## 2023-03-29 DIAGNOSIS — L209 Atopic dermatitis, unspecified: Secondary | ICD-10-CM

## 2023-03-29 DIAGNOSIS — E785 Hyperlipidemia, unspecified: Secondary | ICD-10-CM | POA: Diagnosis not present

## 2023-03-29 DIAGNOSIS — I130 Hypertensive heart and chronic kidney disease with heart failure and stage 1 through stage 4 chronic kidney disease, or unspecified chronic kidney disease: Secondary | ICD-10-CM | POA: Diagnosis not present

## 2023-03-29 DIAGNOSIS — I739 Peripheral vascular disease, unspecified: Secondary | ICD-10-CM | POA: Diagnosis not present

## 2023-03-29 DIAGNOSIS — I509 Heart failure, unspecified: Secondary | ICD-10-CM | POA: Diagnosis not present

## 2023-03-29 DIAGNOSIS — J449 Chronic obstructive pulmonary disease, unspecified: Secondary | ICD-10-CM | POA: Diagnosis not present

## 2023-03-29 DIAGNOSIS — N182 Chronic kidney disease, stage 2 (mild): Secondary | ICD-10-CM | POA: Diagnosis not present

## 2023-03-29 DIAGNOSIS — R2681 Unsteadiness on feet: Secondary | ICD-10-CM | POA: Diagnosis not present

## 2023-03-29 NOTE — Progress Notes (Signed)
Patient here today for two week Dupixent injection for severe atopic dermatitis. Dupixent 300mg /81mL injected into left upper arm. Patient tolerated well.    LOT: [id number] EXP: 12/2024 NGE:[id number]   Dorathy Daft, RMA

## 2023-03-31 DIAGNOSIS — Z7951 Long term (current) use of inhaled steroids: Secondary | ICD-10-CM | POA: Diagnosis not present

## 2023-03-31 DIAGNOSIS — K5900 Constipation, unspecified: Secondary | ICD-10-CM | POA: Diagnosis not present

## 2023-03-31 DIAGNOSIS — H9190 Unspecified hearing loss, unspecified ear: Secondary | ICD-10-CM | POA: Diagnosis not present

## 2023-03-31 DIAGNOSIS — Z7984 Long term (current) use of oral hypoglycemic drugs: Secondary | ICD-10-CM | POA: Diagnosis not present

## 2023-03-31 DIAGNOSIS — L309 Dermatitis, unspecified: Secondary | ICD-10-CM | POA: Diagnosis not present

## 2023-03-31 DIAGNOSIS — I110 Hypertensive heart disease with heart failure: Secondary | ICD-10-CM | POA: Diagnosis not present

## 2023-03-31 DIAGNOSIS — E559 Vitamin D deficiency, unspecified: Secondary | ICD-10-CM | POA: Diagnosis not present

## 2023-03-31 DIAGNOSIS — Z9181 History of falling: Secondary | ICD-10-CM | POA: Diagnosis not present

## 2023-03-31 DIAGNOSIS — E46 Unspecified protein-calorie malnutrition: Secondary | ICD-10-CM | POA: Diagnosis not present

## 2023-03-31 DIAGNOSIS — E785 Hyperlipidemia, unspecified: Secondary | ICD-10-CM | POA: Diagnosis not present

## 2023-03-31 DIAGNOSIS — I5032 Chronic diastolic (congestive) heart failure: Secondary | ICD-10-CM | POA: Diagnosis not present

## 2023-03-31 DIAGNOSIS — Z7952 Long term (current) use of systemic steroids: Secondary | ICD-10-CM | POA: Diagnosis not present

## 2023-03-31 DIAGNOSIS — J4551 Severe persistent asthma with (acute) exacerbation: Secondary | ICD-10-CM | POA: Diagnosis not present

## 2023-03-31 DIAGNOSIS — J449 Chronic obstructive pulmonary disease, unspecified: Secondary | ICD-10-CM | POA: Diagnosis not present

## 2023-03-31 DIAGNOSIS — I10 Essential (primary) hypertension: Secondary | ICD-10-CM | POA: Diagnosis not present

## 2023-03-31 DIAGNOSIS — H3530 Unspecified macular degeneration: Secondary | ICD-10-CM | POA: Diagnosis not present

## 2023-03-31 DIAGNOSIS — K219 Gastro-esophageal reflux disease without esophagitis: Secondary | ICD-10-CM | POA: Diagnosis not present

## 2023-03-31 DIAGNOSIS — H409 Unspecified glaucoma: Secondary | ICD-10-CM | POA: Diagnosis not present

## 2023-03-31 DIAGNOSIS — J441 Chronic obstructive pulmonary disease with (acute) exacerbation: Secondary | ICD-10-CM | POA: Diagnosis not present

## 2023-03-31 DIAGNOSIS — E1122 Type 2 diabetes mellitus with diabetic chronic kidney disease: Secondary | ICD-10-CM | POA: Diagnosis not present

## 2023-03-31 DIAGNOSIS — I509 Heart failure, unspecified: Secondary | ICD-10-CM | POA: Diagnosis not present

## 2023-03-31 DIAGNOSIS — J9601 Acute respiratory failure with hypoxia: Secondary | ICD-10-CM | POA: Diagnosis not present

## 2023-03-31 DIAGNOSIS — I130 Hypertensive heart and chronic kidney disease with heart failure and stage 1 through stage 4 chronic kidney disease, or unspecified chronic kidney disease: Secondary | ICD-10-CM | POA: Diagnosis not present

## 2023-03-31 DIAGNOSIS — H04123 Dry eye syndrome of bilateral lacrimal glands: Secondary | ICD-10-CM | POA: Diagnosis not present

## 2023-03-31 DIAGNOSIS — E1151 Type 2 diabetes mellitus with diabetic peripheral angiopathy without gangrene: Secondary | ICD-10-CM | POA: Diagnosis not present

## 2023-03-31 DIAGNOSIS — N400 Enlarged prostate without lower urinary tract symptoms: Secondary | ICD-10-CM | POA: Diagnosis not present

## 2023-04-05 DIAGNOSIS — J9601 Acute respiratory failure with hypoxia: Secondary | ICD-10-CM | POA: Diagnosis not present

## 2023-04-05 DIAGNOSIS — I5032 Chronic diastolic (congestive) heart failure: Secondary | ICD-10-CM | POA: Diagnosis not present

## 2023-04-05 DIAGNOSIS — J4551 Severe persistent asthma with (acute) exacerbation: Secondary | ICD-10-CM | POA: Diagnosis not present

## 2023-04-05 DIAGNOSIS — J441 Chronic obstructive pulmonary disease with (acute) exacerbation: Secondary | ICD-10-CM | POA: Diagnosis not present

## 2023-04-05 DIAGNOSIS — I110 Hypertensive heart disease with heart failure: Secondary | ICD-10-CM | POA: Diagnosis not present

## 2023-04-05 DIAGNOSIS — E1151 Type 2 diabetes mellitus with diabetic peripheral angiopathy without gangrene: Secondary | ICD-10-CM | POA: Diagnosis not present

## 2023-04-10 DIAGNOSIS — E1151 Type 2 diabetes mellitus with diabetic peripheral angiopathy without gangrene: Secondary | ICD-10-CM | POA: Diagnosis not present

## 2023-04-10 DIAGNOSIS — I5032 Chronic diastolic (congestive) heart failure: Secondary | ICD-10-CM | POA: Diagnosis not present

## 2023-04-10 DIAGNOSIS — J4551 Severe persistent asthma with (acute) exacerbation: Secondary | ICD-10-CM | POA: Diagnosis not present

## 2023-04-10 DIAGNOSIS — J9601 Acute respiratory failure with hypoxia: Secondary | ICD-10-CM | POA: Diagnosis not present

## 2023-04-10 DIAGNOSIS — I110 Hypertensive heart disease with heart failure: Secondary | ICD-10-CM | POA: Diagnosis not present

## 2023-04-10 DIAGNOSIS — J441 Chronic obstructive pulmonary disease with (acute) exacerbation: Secondary | ICD-10-CM | POA: Diagnosis not present

## 2023-04-12 ENCOUNTER — Ambulatory Visit (INDEPENDENT_AMBULATORY_CARE_PROVIDER_SITE_OTHER): Payer: Medicare Other

## 2023-04-12 DIAGNOSIS — L209 Atopic dermatitis, unspecified: Secondary | ICD-10-CM | POA: Diagnosis not present

## 2023-04-12 MED ORDER — DUPILUMAB 300 MG/2ML ~~LOC~~ SOSY
300.0000 mg | PREFILLED_SYRINGE | Freq: Once | SUBCUTANEOUS | Status: AC
Start: 2023-04-12 — End: 2023-04-12
  Administered 2023-04-12: 300 mg via SUBCUTANEOUS

## 2023-04-12 NOTE — Progress Notes (Signed)
Patient here today for two week Dupixent injection for severe atopic dermatitis.  Dupixent 300mg /14mL injected into right upper arm. Patient tolerated well.    LOT: [id number] EXP: 05/2024 NDC: 9629-5284-13   Evorn Gong, RMA

## 2023-04-26 ENCOUNTER — Ambulatory Visit: Payer: Medicare Other

## 2023-04-26 DIAGNOSIS — I739 Peripheral vascular disease, unspecified: Secondary | ICD-10-CM | POA: Diagnosis not present

## 2023-04-26 DIAGNOSIS — I110 Hypertensive heart disease with heart failure: Secondary | ICD-10-CM | POA: Diagnosis not present

## 2023-04-26 DIAGNOSIS — J441 Chronic obstructive pulmonary disease with (acute) exacerbation: Secondary | ICD-10-CM | POA: Diagnosis not present

## 2023-04-26 DIAGNOSIS — J9601 Acute respiratory failure with hypoxia: Secondary | ICD-10-CM | POA: Diagnosis not present

## 2023-04-26 DIAGNOSIS — I509 Heart failure, unspecified: Secondary | ICD-10-CM | POA: Diagnosis not present

## 2023-04-26 DIAGNOSIS — E1151 Type 2 diabetes mellitus with diabetic peripheral angiopathy without gangrene: Secondary | ICD-10-CM | POA: Diagnosis not present

## 2023-04-26 DIAGNOSIS — R2681 Unsteadiness on feet: Secondary | ICD-10-CM | POA: Diagnosis not present

## 2023-04-26 DIAGNOSIS — J4551 Severe persistent asthma with (acute) exacerbation: Secondary | ICD-10-CM | POA: Diagnosis not present

## 2023-04-26 DIAGNOSIS — I130 Hypertensive heart and chronic kidney disease with heart failure and stage 1 through stage 4 chronic kidney disease, or unspecified chronic kidney disease: Secondary | ICD-10-CM | POA: Diagnosis not present

## 2023-04-26 DIAGNOSIS — J449 Chronic obstructive pulmonary disease, unspecified: Secondary | ICD-10-CM | POA: Diagnosis not present

## 2023-04-26 DIAGNOSIS — E1159 Type 2 diabetes mellitus with other circulatory complications: Secondary | ICD-10-CM | POA: Diagnosis not present

## 2023-04-26 DIAGNOSIS — I5032 Chronic diastolic (congestive) heart failure: Secondary | ICD-10-CM | POA: Diagnosis not present

## 2023-04-26 DIAGNOSIS — N182 Chronic kidney disease, stage 2 (mild): Secondary | ICD-10-CM | POA: Diagnosis not present

## 2023-04-26 DIAGNOSIS — E1122 Type 2 diabetes mellitus with diabetic chronic kidney disease: Secondary | ICD-10-CM | POA: Diagnosis not present

## 2023-04-28 ENCOUNTER — Ambulatory Visit (INDEPENDENT_AMBULATORY_CARE_PROVIDER_SITE_OTHER): Payer: Medicare Other | Admitting: Pulmonary Disease

## 2023-04-28 ENCOUNTER — Encounter: Payer: Self-pay | Admitting: Pulmonary Disease

## 2023-04-28 VITALS — BP 120/62 | HR 75 | Temp 98.1°F | Ht 64.0 in | Wt 205.0 lb

## 2023-04-28 DIAGNOSIS — J449 Chronic obstructive pulmonary disease, unspecified: Secondary | ICD-10-CM | POA: Diagnosis not present

## 2023-04-28 DIAGNOSIS — I110 Hypertensive heart disease with heart failure: Secondary | ICD-10-CM | POA: Diagnosis not present

## 2023-04-28 MED ORDER — ALBUTEROL SULFATE HFA 108 (90 BASE) MCG/ACT IN AERS
2.0000 | INHALATION_SPRAY | Freq: Four times a day (QID) | RESPIRATORY_TRACT | 2 refills | Status: AC | PRN
Start: 2023-04-28 — End: ?

## 2023-04-28 NOTE — Progress Notes (Signed)
Synopsis: Referred in by No ref. provider found   Subjective:   PATIENT ID: Cody Bridges GENDER: male DOB: 28-Mar-1933, MRN: [id number]  Chief Complaint  Patient presents with   Follow-up    Shortness of breath and wheezing.     HPI Cody Bridges is an 89 year old male patient with a past medical history of COPD Gold stage II gp B presenting today to the pulmonary clinic for follow up regarding his COPD.   He follows with Dr. Mayo Ao and was last seen in June 2024. He was doing well at the time and was continued on his triple inhaler therapy. He reports today some shortness of breath which is his baseline, cough with minimal sputum production. He scored 18 on the CAT in office. No recent hospitalizations.   Family history - No family history of pulmonary disease   Social History - Quit smoking in 1987, does not remember how much he smoked.   ROS All systems were reviewed and are negative except for the above.  Objective:   Vitals:   04/28/23 0827  BP: 120/62  Pulse: 75  Temp: 98.1 F (36.7 C)  TempSrc: Oral  SpO2: 95%  Weight: 205 lb (93 kg)  Height: 5\' 4"  (1.626 m)   95% on RA BMI Readings from Last 3 Encounters:  04/28/23 35.19 kg/m  11/08/22 35.19 kg/m  06/27/22 35.19 kg/m   Wt Readings from Last 3 Encounters:  04/28/23 205 lb (93 kg)  11/08/22 205 lb 0.4 oz (93 kg)  06/27/22 205 lb (93 kg)    Physical Exam GEN: NAD, Elderly HEENT: Supple Neck, Reactive Pupils, EOMI  CVS: Normal S1, Normal S2, RRR, No murmurs or ES appreciated  Lungs: bibasilar crackles noted and poor air movement.  Abdomen: Soft, non tender, non distended, + BS  Extremities: Warm and well perfused, No edema  Skin: No suspicious lesions appreciated  Psych: Normal Affect  Ancillary Information   CBC    Component Value Date/Time   WBC 13.5 (H) 11/08/2022 0434   RBC 4.55 11/08/2022 0434   HGB 13.9 11/08/2022 0434   HCT 43.7 11/08/2022 0434   PLT 387 11/08/2022 0434   MCV 96.0  11/08/2022 0434   MCH 30.5 11/08/2022 0434   MCHC 31.8 11/08/2022 0434   RDW 12.9 11/08/2022 0434   LYMPHSABS 1.8 06/13/2022 1104   MONOABS 1.6 (H) 06/13/2022 1104   EOSABS 0.5 06/13/2022 1104   BASOSABS 0.0 06/13/2022 1104   Labs and imaging were reviewed.     No data to display           Assessment & Plan:  Cody Bridges is an 89 year old male patient with a past medical history of COPD Gold stage II gp B presenting today to the pulmonary clinic for follow up regarding his COPD.   #COPD Stage II gp B   CAT 18   []  C/w Fluticasone-Umeclidinium-Vilanterol [Trelegy Elipta] 100 1 puff daily.  []  Albuterol 2puffs Q6H as needed  []  Follow up with Dr. Mayo Ao at [facility].  Return if symptoms worsen or fail to improve.  I spent 45 minutes caring for this patient today, including preparing to see the patient, obtaining a medical history , reviewing a separately obtained history, performing a medically appropriate examination and/or evaluation, ordering medications, tests, or procedures, documenting clinical information in the electronic health record, and independently interpreting results (not separately reported/billed) and communicating results to the patient/family/caregiver  Janann Colonel, MD [facility] 04/28/2023  9:23 AM

## 2023-04-30 DIAGNOSIS — Z7984 Long term (current) use of oral hypoglycemic drugs: Secondary | ICD-10-CM | POA: Diagnosis not present

## 2023-04-30 DIAGNOSIS — J9601 Acute respiratory failure with hypoxia: Secondary | ICD-10-CM | POA: Diagnosis not present

## 2023-04-30 DIAGNOSIS — L309 Dermatitis, unspecified: Secondary | ICD-10-CM | POA: Diagnosis not present

## 2023-04-30 DIAGNOSIS — I110 Hypertensive heart disease with heart failure: Secondary | ICD-10-CM | POA: Diagnosis not present

## 2023-04-30 DIAGNOSIS — I5032 Chronic diastolic (congestive) heart failure: Secondary | ICD-10-CM | POA: Diagnosis not present

## 2023-04-30 DIAGNOSIS — H04123 Dry eye syndrome of bilateral lacrimal glands: Secondary | ICD-10-CM | POA: Diagnosis not present

## 2023-04-30 DIAGNOSIS — H9190 Unspecified hearing loss, unspecified ear: Secondary | ICD-10-CM | POA: Diagnosis not present

## 2023-04-30 DIAGNOSIS — H3530 Unspecified macular degeneration: Secondary | ICD-10-CM | POA: Diagnosis not present

## 2023-04-30 DIAGNOSIS — E785 Hyperlipidemia, unspecified: Secondary | ICD-10-CM | POA: Diagnosis not present

## 2023-04-30 DIAGNOSIS — E1151 Type 2 diabetes mellitus with diabetic peripheral angiopathy without gangrene: Secondary | ICD-10-CM | POA: Diagnosis not present

## 2023-04-30 DIAGNOSIS — H409 Unspecified glaucoma: Secondary | ICD-10-CM | POA: Diagnosis not present

## 2023-04-30 DIAGNOSIS — J4551 Severe persistent asthma with (acute) exacerbation: Secondary | ICD-10-CM | POA: Diagnosis not present

## 2023-04-30 DIAGNOSIS — N400 Enlarged prostate without lower urinary tract symptoms: Secondary | ICD-10-CM | POA: Diagnosis not present

## 2023-04-30 DIAGNOSIS — Z7951 Long term (current) use of inhaled steroids: Secondary | ICD-10-CM | POA: Diagnosis not present

## 2023-04-30 DIAGNOSIS — E46 Unspecified protein-calorie malnutrition: Secondary | ICD-10-CM | POA: Diagnosis not present

## 2023-04-30 DIAGNOSIS — Z9181 History of falling: Secondary | ICD-10-CM | POA: Diagnosis not present

## 2023-04-30 DIAGNOSIS — K5900 Constipation, unspecified: Secondary | ICD-10-CM | POA: Diagnosis not present

## 2023-04-30 DIAGNOSIS — K219 Gastro-esophageal reflux disease without esophagitis: Secondary | ICD-10-CM | POA: Diagnosis not present

## 2023-04-30 DIAGNOSIS — J441 Chronic obstructive pulmonary disease with (acute) exacerbation: Secondary | ICD-10-CM | POA: Diagnosis not present

## 2023-04-30 DIAGNOSIS — E559 Vitamin D deficiency, unspecified: Secondary | ICD-10-CM | POA: Diagnosis not present

## 2023-04-30 DIAGNOSIS — Z7952 Long term (current) use of systemic steroids: Secondary | ICD-10-CM | POA: Diagnosis not present

## 2023-05-01 DIAGNOSIS — J9601 Acute respiratory failure with hypoxia: Secondary | ICD-10-CM | POA: Diagnosis not present

## 2023-05-01 DIAGNOSIS — E1151 Type 2 diabetes mellitus with diabetic peripheral angiopathy without gangrene: Secondary | ICD-10-CM | POA: Diagnosis not present

## 2023-05-01 DIAGNOSIS — J441 Chronic obstructive pulmonary disease with (acute) exacerbation: Secondary | ICD-10-CM | POA: Diagnosis not present

## 2023-05-01 DIAGNOSIS — J4551 Severe persistent asthma with (acute) exacerbation: Secondary | ICD-10-CM | POA: Diagnosis not present

## 2023-05-01 DIAGNOSIS — I5032 Chronic diastolic (congestive) heart failure: Secondary | ICD-10-CM | POA: Diagnosis not present

## 2023-05-01 DIAGNOSIS — I110 Hypertensive heart disease with heart failure: Secondary | ICD-10-CM | POA: Diagnosis not present

## 2023-05-04 ENCOUNTER — Ambulatory Visit: Payer: Medicare Other | Admitting: Dermatology

## 2023-05-04 DIAGNOSIS — E1159 Type 2 diabetes mellitus with other circulatory complications: Secondary | ICD-10-CM | POA: Diagnosis not present

## 2023-05-04 DIAGNOSIS — B351 Tinea unguium: Secondary | ICD-10-CM | POA: Diagnosis not present

## 2023-05-08 DIAGNOSIS — I110 Hypertensive heart disease with heart failure: Secondary | ICD-10-CM | POA: Diagnosis not present

## 2023-05-08 DIAGNOSIS — E1151 Type 2 diabetes mellitus with diabetic peripheral angiopathy without gangrene: Secondary | ICD-10-CM | POA: Diagnosis not present

## 2023-05-08 DIAGNOSIS — J441 Chronic obstructive pulmonary disease with (acute) exacerbation: Secondary | ICD-10-CM | POA: Diagnosis not present

## 2023-05-08 DIAGNOSIS — J9601 Acute respiratory failure with hypoxia: Secondary | ICD-10-CM | POA: Diagnosis not present

## 2023-05-08 DIAGNOSIS — J4551 Severe persistent asthma with (acute) exacerbation: Secondary | ICD-10-CM | POA: Diagnosis not present

## 2023-05-08 DIAGNOSIS — I5032 Chronic diastolic (congestive) heart failure: Secondary | ICD-10-CM | POA: Diagnosis not present

## 2023-05-10 ENCOUNTER — Ambulatory Visit: Payer: Medicare Other | Admitting: Dermatology

## 2023-05-10 ENCOUNTER — Encounter: Payer: Self-pay | Admitting: Dermatology

## 2023-05-10 DIAGNOSIS — Z79899 Other long term (current) drug therapy: Secondary | ICD-10-CM

## 2023-05-10 DIAGNOSIS — L853 Xerosis cutis: Secondary | ICD-10-CM

## 2023-05-10 DIAGNOSIS — L209 Atopic dermatitis, unspecified: Secondary | ICD-10-CM

## 2023-05-10 DIAGNOSIS — L2081 Atopic neurodermatitis: Secondary | ICD-10-CM | POA: Diagnosis not present

## 2023-05-10 DIAGNOSIS — Z7189 Other specified counseling: Secondary | ICD-10-CM

## 2023-05-10 MED ORDER — DUPILUMAB 300 MG/2ML ~~LOC~~ SOSY
300.0000 mg | PREFILLED_SYRINGE | SUBCUTANEOUS | Status: DC
Start: 2023-05-10 — End: 2023-12-25
  Administered 2023-05-10 – 2023-09-13 (×9): 300 mg via SUBCUTANEOUS

## 2023-05-10 NOTE — Progress Notes (Signed)
   Follow-Up Visit   Subjective  Cody Bridges is a 89 y.o. male who presents for the following: Atopic Neurodermatitis, severe, 6 month follow up. On Dupixent every 2 weeks. Tolerating well. Denies adverse reactions or side effects.  Reports he is no longer itching while on Dupixent.   The following portions of the chart were reviewed this encounter and updated as appropriate: medications, allergies, medical history  Review of Systems:  No other skin or systemic complaints except as noted in HPI or Assessment and Plan.  Objective  Well appearing patient in no apparent distress; mood and affect are within normal limits.  Areas Examined: Face, arms, torso, legs  Relevant physical exam findings are noted in the Assessment and Plan.   Assessment & Plan   Atopic neurodermatitis  Related Medications dupilumab (DUPIXENT) prefilled syringe 300 mg   Atopic dermatitis, unspecified type  Related Medications DUPIXENT 300 MG/2ML prefilled syringe INJECT 1 SYRINGE UNDER THE SKIN EVERY OTHER WEEK  dupilumab (DUPIXENT) prefilled syringe 300 mg   dupilumab (DUPIXENT) prefilled syringe 300 mg    ATOPIC DERMATITIS Exam: marked xerosis at torso, arms, legs 30% BSA Chronic and persistent condition with duration or expected duration over one year. Condition is improving with treatment but not currently at goal.  Atopic dermatitis - Severe, on Dupixent (biologic medication).  Atopic dermatitis (eczema) is a chronic, relapsing, pruritic condition that can significantly affect quality of life. It is often associated with allergic rhinitis and/or asthma and can require treatment with topical medications, phototherapy, or in severe cases biologic medications, which require long term medication management.    Treatment Plan: Continue Dupixent 300 mg/49mL SQ QOW. Patient denies side effects. Cont Mometasone cr qd up to 5d/wk prn flares Cont Cerave cr daily  Dupixent 300mg /64mL injected SQ  into the left upper arm. Patient tolerated injection well.   NDC: 1610-9604-54 Lot: [id number] Exp: 10/2024   Potential side effects include allergic reaction, herpes infections, injection site reactions and conjunctivitis (inflammation of the eyes).  The use of Dupixent requires long term medication management, including periodic office visits.    Long term medication management.  Patient is using long term (months to years) prescription medication  to control their dermatologic condition.  These medications require periodic monitoring to evaluate for efficacy and side effects and may require periodic laboratory monitoring.   Xerosis - diffuse xerotic patches - recommend gentle, hydrating skin care - gentle skin care handout given Recommend gentle skin care.   Return in about 3 months (around 08/10/2023) for Atopic Dermatitis Follow Up, Dupixent injection on nurse schedule in 2 weeks.  I, Lawson Radar, CMA, am acting as scribe for Armida Sans, MD.   Documentation: I have reviewed the above documentation for accuracy and completeness, and I agree with the above.  Armida Sans, MD

## 2023-05-10 NOTE — Patient Instructions (Signed)
Continue Dupixent 300 mg /2 ml every 2 weeks.   Continue Mometasone cream daily up to 5 days a week as needed.   Dupilumab (Dupixent) is a treatment given by injection for adults and children with moderate-to-severe atopic dermatitis. Goal is control of skin condition, not cure. It is given as 2 injections at the first dose followed by 1 injection ever 2 weeks thereafter.  Young children are dosed monthly.  Potential side effects include allergic reaction, herpes infections, injection site reactions and conjunctivitis (inflammation of the eyes).  The use of Dupixent requires long term medication management, including periodic office visits.   Topical steroids (such as triamcinolone, fluocinolone, fluocinonide, mometasone, clobetasol, halobetasol, betamethasone, hydrocortisone) can cause thinning and lightening of the skin if they are used for too long in the same area. Your physician has selected the right strength medicine for your problem and area affected on the body. Please use your medication only as directed by your physician to prevent side effects.    Moisturize daily with CeraVe cream.    Due to recent changes in healthcare laws, you may see results of your pathology and/or laboratory studies on MyChart before the doctors have had a chance to review them. We understand that in some cases there may be results that are confusing or concerning to you. Please understand that not all results are received at the same time and often the doctors may need to interpret multiple results in order to provide you with the best plan of care or course of treatment. Therefore, we ask that you please give [place] 2 business days to thoroughly review all your results before contacting the office for clarification. Should we see a critical lab result, you will be contacted sooner.   If You Need Anything After Your Visit  If you have any questions or concerns for your doctor, please call our main line at  [phone] and press option 4 to reach your doctor's medical assistant. If no one answers, please leave a voicemail as directed and we will return your call as soon as possible. Messages left after 4 pm will be answered the following business day.   You may also send [place] a message via MyChart. We typically respond to MyChart messages within 1-2 business days.  For prescription refills, please ask your pharmacy to contact our office. Our fax number is [phone].  If you have an urgent issue when the clinic is closed that cannot wait until the next business day, you can page your doctor at the number below.    Please note that while we do our best to be available for urgent issues outside of office hours, we are not available 24/7.   If you have an urgent issue and are unable to reach [place], you may choose to seek medical care at your doctor's office, retail clinic, urgent care center, or emergency room.  If you have a medical emergency, please immediately call 911 or go to the emergency department.  Pager Numbers  - Dr. Gwen Pounds: [phone]  - Dr. Roseanne Reno: [phone]  - Dr. Katrinka Blazing: [phone]   In the event of inclement weather, please call our main line at [phone] for an update on the status of any delays or closures.  Dermatology Medication Tips: Please keep the boxes that topical medications come in in order to help keep track of the instructions about where and how to use these. Pharmacies typically print the medication instructions only on the boxes and not directly on the medication  tubes.   If your medication is too expensive, please contact our office at [phone] option 4 or send [place] a message through MyChart.   We are unable to tell what your co-pay for medications will be in advance as this is different depending on your insurance coverage. However, we may be able to find a substitute medication at lower cost or fill out paperwork to get insurance to cover a needed  medication.   If a prior authorization is required to get your medication covered by your insurance company, please allow [place] 1-2 business days to complete this process.  Drug prices often vary depending on where the prescription is filled and some pharmacies may offer cheaper prices.  The website www.goodrx.com contains coupons for medications through different pharmacies. The prices here do not account for what the cost may be with help from insurance (it may be cheaper with your insurance), but the website can give you the price if you did not use any insurance.  - You can print the associated coupon and take it with your prescription to the pharmacy.  - You may also stop by our office during regular business hours and pick up a GoodRx coupon card.  - If you need your prescription sent electronically to a different pharmacy, notify our office through [facility] or by phone at [phone] option 4.     Si Usted Necesita Algo Despus de Su Visita  Tambin puede enviarnos un mensaje a travs de Clinical cytogeneticist. Por lo general respondemos a los mensajes de MyChart en el transcurso de 1 a 2 das hbiles.  Para renovar recetas, por favor pida a su farmacia que se ponga en contacto con nuestra oficina. Annie Sable de fax es [place] [phone].  Si tiene un asunto urgente cuando la clnica est cerrada y que no puede esperar hasta el siguiente da hbil, puede llamar/localizar a su doctor(a) al nmero que aparece a continuacin.   Por favor, tenga en cuenta que aunque hacemos todo lo posible para estar disponibles para asuntos urgentes fuera del horario de [place], no estamos disponibles las 24 horas del da, los 7 [address]  [address] de la [place].   Si tiene un problema urgente y no puede comunicarse con nosotros, puede optar por buscar atencin mdica  en el consultorio de su doctor(a), en una clnica privada, en un centro de atencin urgente o en una sala de emergencias.  Si tiene Engineer, drilling,  por favor llame inmediatamente al 911 o vaya a la sala de emergencias.  Nmeros de bper  - Dr. Gwen Pounds: [phone]  - Dra. Roseanne Reno: [phone]  - Dr. Katrinka Blazing: [phone]   En caso de inclemencias del tiempo, por favor llame a Lacy Duverney principal al [phone] para una actualizacin sobre el [place] de cualquier retraso o cierre.  Consejos para la medicacin en dermatologa: Por favor, guarde las cajas en las que vienen los medicamentos de uso tpico para ayudarle a seguir las instrucciones sobre dnde y cmo usarlos. Las farmacias generalmente imprimen las instrucciones del medicamento slo en las cajas y no directamente en los tubos del [place].   Si su medicamento es muy caro, por favor, pngase en contacto con Rolm Gala llamando al [phone] y presione la opcin 4 o envenos un mensaje a travs de Clinical cytogeneticist.   No podemos decirle cul ser su copago por los medicamentos por adelantado ya que esto es diferente dependiendo de la cobertura de su seguro. Sin embargo, es posible que podamos encontrar un medicamento sustituto a Adult nurse  costo o llenar un formulario para que el seguro cubra el medicamento que se considera necesario.   Si se requiere una autorizacin previa para que su compaa de seguros [place] su medicamento, por favor permtanos de 1 a 2 das hbiles para completar [address]  Los precios de los medicamentos varan con frecuencia dependiendo del Environmental consultant de dnde se surte la receta y alguna farmacias pueden ofrecer precios ms baratos.  El sitio web www.goodrx.com tiene cupones para medicamentos de Health and safety inspector. Los precios aqu no tienen en cuenta lo que podra costar con la ayuda del seguro (puede ser ms barato con su seguro), pero el sitio web puede darle el precio si no utiliz Tourist information centre manager.  - Puede imprimir el cupn correspondiente y llevarlo con su receta a la farmacia.  - Tambin puede pasar por nuestra oficina durante el horario de atencin  regular y Education officer, museum una tarjeta de cupones de GoodRx.  - Si necesita que su receta se enve electrnicamente a una farmacia diferente, informe a nuestra oficina a travs de MyChart de [place] o por telfono llamando al [phone] y presione la opcin 4.

## 2023-05-12 DIAGNOSIS — H409 Unspecified glaucoma: Secondary | ICD-10-CM | POA: Diagnosis not present

## 2023-05-12 DIAGNOSIS — J4551 Severe persistent asthma with (acute) exacerbation: Secondary | ICD-10-CM | POA: Diagnosis not present

## 2023-05-12 DIAGNOSIS — K5900 Constipation, unspecified: Secondary | ICD-10-CM | POA: Diagnosis not present

## 2023-05-12 DIAGNOSIS — J9601 Acute respiratory failure with hypoxia: Secondary | ICD-10-CM | POA: Diagnosis not present

## 2023-05-12 DIAGNOSIS — I5032 Chronic diastolic (congestive) heart failure: Secondary | ICD-10-CM | POA: Diagnosis not present

## 2023-05-12 DIAGNOSIS — J441 Chronic obstructive pulmonary disease with (acute) exacerbation: Secondary | ICD-10-CM | POA: Diagnosis not present

## 2023-05-12 DIAGNOSIS — E1151 Type 2 diabetes mellitus with diabetic peripheral angiopathy without gangrene: Secondary | ICD-10-CM | POA: Diagnosis not present

## 2023-05-12 DIAGNOSIS — E46 Unspecified protein-calorie malnutrition: Secondary | ICD-10-CM | POA: Diagnosis not present

## 2023-05-12 DIAGNOSIS — I110 Hypertensive heart disease with heart failure: Secondary | ICD-10-CM | POA: Diagnosis not present

## 2023-05-12 DIAGNOSIS — H04123 Dry eye syndrome of bilateral lacrimal glands: Secondary | ICD-10-CM | POA: Diagnosis not present

## 2023-05-12 DIAGNOSIS — H3530 Unspecified macular degeneration: Secondary | ICD-10-CM | POA: Diagnosis not present

## 2023-05-12 DIAGNOSIS — E559 Vitamin D deficiency, unspecified: Secondary | ICD-10-CM | POA: Diagnosis not present

## 2023-05-15 DIAGNOSIS — J4551 Severe persistent asthma with (acute) exacerbation: Secondary | ICD-10-CM | POA: Diagnosis not present

## 2023-05-15 DIAGNOSIS — J441 Chronic obstructive pulmonary disease with (acute) exacerbation: Secondary | ICD-10-CM | POA: Diagnosis not present

## 2023-05-15 DIAGNOSIS — E1151 Type 2 diabetes mellitus with diabetic peripheral angiopathy without gangrene: Secondary | ICD-10-CM | POA: Diagnosis not present

## 2023-05-15 DIAGNOSIS — I5032 Chronic diastolic (congestive) heart failure: Secondary | ICD-10-CM | POA: Diagnosis not present

## 2023-05-15 DIAGNOSIS — I110 Hypertensive heart disease with heart failure: Secondary | ICD-10-CM | POA: Diagnosis not present

## 2023-05-15 DIAGNOSIS — J9601 Acute respiratory failure with hypoxia: Secondary | ICD-10-CM | POA: Diagnosis not present

## 2023-05-18 DIAGNOSIS — R32 Unspecified urinary incontinence: Secondary | ICD-10-CM | POA: Diagnosis not present

## 2023-05-18 DIAGNOSIS — U071 COVID-19: Secondary | ICD-10-CM | POA: Diagnosis not present

## 2023-05-18 DIAGNOSIS — J449 Chronic obstructive pulmonary disease, unspecified: Secondary | ICD-10-CM | POA: Diagnosis not present

## 2023-05-23 DIAGNOSIS — I110 Hypertensive heart disease with heart failure: Secondary | ICD-10-CM | POA: Diagnosis not present

## 2023-05-23 DIAGNOSIS — J449 Chronic obstructive pulmonary disease, unspecified: Secondary | ICD-10-CM | POA: Diagnosis not present

## 2023-05-24 ENCOUNTER — Ambulatory Visit: Payer: Medicare Other

## 2023-05-24 DIAGNOSIS — I509 Heart failure, unspecified: Secondary | ICD-10-CM | POA: Diagnosis not present

## 2023-05-24 DIAGNOSIS — I739 Peripheral vascular disease, unspecified: Secondary | ICD-10-CM | POA: Diagnosis not present

## 2023-05-24 DIAGNOSIS — R269 Unspecified abnormalities of gait and mobility: Secondary | ICD-10-CM | POA: Diagnosis not present

## 2023-05-24 DIAGNOSIS — S81802A Unspecified open wound, left lower leg, initial encounter: Secondary | ICD-10-CM | POA: Diagnosis not present

## 2023-05-24 DIAGNOSIS — N182 Chronic kidney disease, stage 2 (mild): Secondary | ICD-10-CM | POA: Diagnosis not present

## 2023-05-24 DIAGNOSIS — I130 Hypertensive heart and chronic kidney disease with heart failure and stage 1 through stage 4 chronic kidney disease, or unspecified chronic kidney disease: Secondary | ICD-10-CM | POA: Diagnosis not present

## 2023-05-24 DIAGNOSIS — E1122 Type 2 diabetes mellitus with diabetic chronic kidney disease: Secondary | ICD-10-CM | POA: Diagnosis not present

## 2023-05-24 DIAGNOSIS — J449 Chronic obstructive pulmonary disease, unspecified: Secondary | ICD-10-CM | POA: Diagnosis not present

## 2023-05-24 DIAGNOSIS — U071 COVID-19: Secondary | ICD-10-CM | POA: Diagnosis not present

## 2023-05-26 DIAGNOSIS — J9601 Acute respiratory failure with hypoxia: Secondary | ICD-10-CM | POA: Diagnosis not present

## 2023-05-26 DIAGNOSIS — J4551 Severe persistent asthma with (acute) exacerbation: Secondary | ICD-10-CM | POA: Diagnosis not present

## 2023-05-26 DIAGNOSIS — I5032 Chronic diastolic (congestive) heart failure: Secondary | ICD-10-CM | POA: Diagnosis not present

## 2023-05-26 DIAGNOSIS — E1151 Type 2 diabetes mellitus with diabetic peripheral angiopathy without gangrene: Secondary | ICD-10-CM | POA: Diagnosis not present

## 2023-05-26 DIAGNOSIS — J441 Chronic obstructive pulmonary disease with (acute) exacerbation: Secondary | ICD-10-CM | POA: Diagnosis not present

## 2023-05-26 DIAGNOSIS — I110 Hypertensive heart disease with heart failure: Secondary | ICD-10-CM | POA: Diagnosis not present

## 2023-05-31 ENCOUNTER — Ambulatory Visit: Payer: Medicare Other

## 2023-05-31 DIAGNOSIS — L209 Atopic dermatitis, unspecified: Secondary | ICD-10-CM | POA: Diagnosis not present

## 2023-05-31 NOTE — Progress Notes (Signed)
Patient here today for two week Dupixent injection for severe atopic dermatitis.  Dupixent 300mg /34mL injected into right upper arm. Patient tolerated well.    LOT: [id number] EXP: 06/2025 NDC: 1478-2956-21   Dorathy Daft, RMA

## 2023-06-07 DIAGNOSIS — J449 Chronic obstructive pulmonary disease, unspecified: Secondary | ICD-10-CM | POA: Diagnosis not present

## 2023-06-07 DIAGNOSIS — R0600 Dyspnea, unspecified: Secondary | ICD-10-CM | POA: Diagnosis not present

## 2023-06-07 DIAGNOSIS — R918 Other nonspecific abnormal finding of lung field: Secondary | ICD-10-CM | POA: Diagnosis not present

## 2023-06-14 ENCOUNTER — Ambulatory Visit: Payer: Medicare Other

## 2023-06-14 DIAGNOSIS — I509 Heart failure, unspecified: Secondary | ICD-10-CM | POA: Diagnosis not present

## 2023-06-14 DIAGNOSIS — D649 Anemia, unspecified: Secondary | ICD-10-CM | POA: Diagnosis not present

## 2023-06-14 DIAGNOSIS — R269 Unspecified abnormalities of gait and mobility: Secondary | ICD-10-CM | POA: Diagnosis not present

## 2023-06-14 DIAGNOSIS — I130 Hypertensive heart and chronic kidney disease with heart failure and stage 1 through stage 4 chronic kidney disease, or unspecified chronic kidney disease: Secondary | ICD-10-CM | POA: Diagnosis not present

## 2023-06-14 DIAGNOSIS — I739 Peripheral vascular disease, unspecified: Secondary | ICD-10-CM | POA: Diagnosis not present

## 2023-06-14 DIAGNOSIS — N182 Chronic kidney disease, stage 2 (mild): Secondary | ICD-10-CM | POA: Diagnosis not present

## 2023-06-14 DIAGNOSIS — E1122 Type 2 diabetes mellitus with diabetic chronic kidney disease: Secondary | ICD-10-CM | POA: Diagnosis not present

## 2023-06-14 DIAGNOSIS — S81802A Unspecified open wound, left lower leg, initial encounter: Secondary | ICD-10-CM | POA: Diagnosis not present

## 2023-06-19 DIAGNOSIS — Z79899 Other long term (current) drug therapy: Secondary | ICD-10-CM | POA: Diagnosis not present

## 2023-06-19 DIAGNOSIS — D509 Iron deficiency anemia, unspecified: Secondary | ICD-10-CM | POA: Diagnosis not present

## 2023-06-21 ENCOUNTER — Ambulatory Visit: Payer: Medicare Other

## 2023-06-21 DIAGNOSIS — L209 Atopic dermatitis, unspecified: Secondary | ICD-10-CM

## 2023-06-21 NOTE — Progress Notes (Signed)
Patient here today for two week Dupixent injection for severe atopic dermatitis.  Dupixent 300mg /36mL injected into right upper arm. Patient tolerated well.    LOT: [id number] EXP: 03/2025 NDC: 9147-8295-62   Dorathy Daft, RMA

## 2023-06-23 DIAGNOSIS — E1122 Type 2 diabetes mellitus with diabetic chronic kidney disease: Secondary | ICD-10-CM | POA: Diagnosis not present

## 2023-06-23 DIAGNOSIS — I739 Peripheral vascular disease, unspecified: Secondary | ICD-10-CM | POA: Diagnosis not present

## 2023-06-23 DIAGNOSIS — D649 Anemia, unspecified: Secondary | ICD-10-CM | POA: Diagnosis not present

## 2023-06-23 DIAGNOSIS — I130 Hypertensive heart and chronic kidney disease with heart failure and stage 1 through stage 4 chronic kidney disease, or unspecified chronic kidney disease: Secondary | ICD-10-CM | POA: Diagnosis not present

## 2023-06-26 DIAGNOSIS — Z7951 Long term (current) use of inhaled steroids: Secondary | ICD-10-CM | POA: Diagnosis not present

## 2023-06-26 DIAGNOSIS — J9600 Acute respiratory failure, unspecified whether with hypoxia or hypercapnia: Secondary | ICD-10-CM | POA: Diagnosis not present

## 2023-06-26 DIAGNOSIS — G3184 Mild cognitive impairment, so stated: Secondary | ICD-10-CM | POA: Diagnosis not present

## 2023-06-26 DIAGNOSIS — I5032 Chronic diastolic (congestive) heart failure: Secondary | ICD-10-CM | POA: Diagnosis not present

## 2023-06-26 DIAGNOSIS — E1151 Type 2 diabetes mellitus with diabetic peripheral angiopathy without gangrene: Secondary | ICD-10-CM | POA: Diagnosis not present

## 2023-06-26 DIAGNOSIS — K219 Gastro-esophageal reflux disease without esophagitis: Secondary | ICD-10-CM | POA: Diagnosis not present

## 2023-06-26 DIAGNOSIS — D631 Anemia in chronic kidney disease: Secondary | ICD-10-CM | POA: Diagnosis not present

## 2023-06-26 DIAGNOSIS — Z7984 Long term (current) use of oral hypoglycemic drugs: Secondary | ICD-10-CM | POA: Diagnosis not present

## 2023-06-26 DIAGNOSIS — I130 Hypertensive heart and chronic kidney disease with heart failure and stage 1 through stage 4 chronic kidney disease, or unspecified chronic kidney disease: Secondary | ICD-10-CM | POA: Diagnosis not present

## 2023-06-26 DIAGNOSIS — J449 Chronic obstructive pulmonary disease, unspecified: Secondary | ICD-10-CM | POA: Diagnosis not present

## 2023-06-26 DIAGNOSIS — N182 Chronic kidney disease, stage 2 (mild): Secondary | ICD-10-CM | POA: Diagnosis not present

## 2023-06-26 DIAGNOSIS — N400 Enlarged prostate without lower urinary tract symptoms: Secondary | ICD-10-CM | POA: Diagnosis not present

## 2023-06-26 DIAGNOSIS — S81802D Unspecified open wound, left lower leg, subsequent encounter: Secondary | ICD-10-CM | POA: Diagnosis not present

## 2023-06-26 DIAGNOSIS — E785 Hyperlipidemia, unspecified: Secondary | ICD-10-CM | POA: Diagnosis not present

## 2023-06-26 DIAGNOSIS — L03115 Cellulitis of right lower limb: Secondary | ICD-10-CM | POA: Diagnosis not present

## 2023-06-26 DIAGNOSIS — Z87891 Personal history of nicotine dependence: Secondary | ICD-10-CM | POA: Diagnosis not present

## 2023-06-26 DIAGNOSIS — F32A Depression, unspecified: Secondary | ICD-10-CM | POA: Diagnosis not present

## 2023-06-26 DIAGNOSIS — E1122 Type 2 diabetes mellitus with diabetic chronic kidney disease: Secondary | ICD-10-CM | POA: Diagnosis not present

## 2023-06-27 DIAGNOSIS — I110 Hypertensive heart disease with heart failure: Secondary | ICD-10-CM | POA: Diagnosis not present

## 2023-06-27 DIAGNOSIS — J449 Chronic obstructive pulmonary disease, unspecified: Secondary | ICD-10-CM | POA: Diagnosis not present

## 2023-07-03 DIAGNOSIS — E1122 Type 2 diabetes mellitus with diabetic chronic kidney disease: Secondary | ICD-10-CM | POA: Diagnosis not present

## 2023-07-03 DIAGNOSIS — I5032 Chronic diastolic (congestive) heart failure: Secondary | ICD-10-CM | POA: Diagnosis not present

## 2023-07-03 DIAGNOSIS — D631 Anemia in chronic kidney disease: Secondary | ICD-10-CM | POA: Diagnosis not present

## 2023-07-03 DIAGNOSIS — I130 Hypertensive heart and chronic kidney disease with heart failure and stage 1 through stage 4 chronic kidney disease, or unspecified chronic kidney disease: Secondary | ICD-10-CM | POA: Diagnosis not present

## 2023-07-03 DIAGNOSIS — E1151 Type 2 diabetes mellitus with diabetic peripheral angiopathy without gangrene: Secondary | ICD-10-CM | POA: Diagnosis not present

## 2023-07-03 DIAGNOSIS — N182 Chronic kidney disease, stage 2 (mild): Secondary | ICD-10-CM | POA: Diagnosis not present

## 2023-07-05 ENCOUNTER — Ambulatory Visit: Payer: Medicare Other

## 2023-07-05 DIAGNOSIS — L209 Atopic dermatitis, unspecified: Secondary | ICD-10-CM

## 2023-07-05 NOTE — Progress Notes (Signed)
Patient here today for two week Dupixent injection for severe atopic dermatitis.  Dupixent 300mg /37mL injected into left upper arm. Patient tolerated well.    LOT: [id number] EXP: 03/2025 NDC: 8315-1761-60   Dorathy Daft, RMA

## 2023-07-10 DIAGNOSIS — E1151 Type 2 diabetes mellitus with diabetic peripheral angiopathy without gangrene: Secondary | ICD-10-CM | POA: Diagnosis not present

## 2023-07-10 DIAGNOSIS — I5032 Chronic diastolic (congestive) heart failure: Secondary | ICD-10-CM | POA: Diagnosis not present

## 2023-07-10 DIAGNOSIS — I130 Hypertensive heart and chronic kidney disease with heart failure and stage 1 through stage 4 chronic kidney disease, or unspecified chronic kidney disease: Secondary | ICD-10-CM | POA: Diagnosis not present

## 2023-07-10 DIAGNOSIS — D631 Anemia in chronic kidney disease: Secondary | ICD-10-CM | POA: Diagnosis not present

## 2023-07-10 DIAGNOSIS — N182 Chronic kidney disease, stage 2 (mild): Secondary | ICD-10-CM | POA: Diagnosis not present

## 2023-07-10 DIAGNOSIS — E1122 Type 2 diabetes mellitus with diabetic chronic kidney disease: Secondary | ICD-10-CM | POA: Diagnosis not present

## 2023-07-12 DIAGNOSIS — I130 Hypertensive heart and chronic kidney disease with heart failure and stage 1 through stage 4 chronic kidney disease, or unspecified chronic kidney disease: Secondary | ICD-10-CM | POA: Diagnosis not present

## 2023-07-12 DIAGNOSIS — N182 Chronic kidney disease, stage 2 (mild): Secondary | ICD-10-CM | POA: Diagnosis not present

## 2023-07-12 DIAGNOSIS — D631 Anemia in chronic kidney disease: Secondary | ICD-10-CM | POA: Diagnosis not present

## 2023-07-12 DIAGNOSIS — I5032 Chronic diastolic (congestive) heart failure: Secondary | ICD-10-CM | POA: Diagnosis not present

## 2023-07-12 DIAGNOSIS — E1122 Type 2 diabetes mellitus with diabetic chronic kidney disease: Secondary | ICD-10-CM | POA: Diagnosis not present

## 2023-07-12 DIAGNOSIS — E1151 Type 2 diabetes mellitus with diabetic peripheral angiopathy without gangrene: Secondary | ICD-10-CM | POA: Diagnosis not present

## 2023-07-17 DIAGNOSIS — I5032 Chronic diastolic (congestive) heart failure: Secondary | ICD-10-CM | POA: Diagnosis not present

## 2023-07-17 DIAGNOSIS — I130 Hypertensive heart and chronic kidney disease with heart failure and stage 1 through stage 4 chronic kidney disease, or unspecified chronic kidney disease: Secondary | ICD-10-CM | POA: Diagnosis not present

## 2023-07-17 DIAGNOSIS — E1151 Type 2 diabetes mellitus with diabetic peripheral angiopathy without gangrene: Secondary | ICD-10-CM | POA: Diagnosis not present

## 2023-07-17 DIAGNOSIS — N182 Chronic kidney disease, stage 2 (mild): Secondary | ICD-10-CM | POA: Diagnosis not present

## 2023-07-17 DIAGNOSIS — D631 Anemia in chronic kidney disease: Secondary | ICD-10-CM | POA: Diagnosis not present

## 2023-07-17 DIAGNOSIS — E1122 Type 2 diabetes mellitus with diabetic chronic kidney disease: Secondary | ICD-10-CM | POA: Diagnosis not present

## 2023-07-19 ENCOUNTER — Ambulatory Visit: Payer: Medicare Other

## 2023-07-19 DIAGNOSIS — E1159 Type 2 diabetes mellitus with other circulatory complications: Secondary | ICD-10-CM | POA: Diagnosis not present

## 2023-07-19 DIAGNOSIS — E1122 Type 2 diabetes mellitus with diabetic chronic kidney disease: Secondary | ICD-10-CM | POA: Diagnosis not present

## 2023-07-19 DIAGNOSIS — B351 Tinea unguium: Secondary | ICD-10-CM | POA: Diagnosis not present

## 2023-07-19 DIAGNOSIS — J449 Chronic obstructive pulmonary disease, unspecified: Secondary | ICD-10-CM | POA: Diagnosis not present

## 2023-07-19 DIAGNOSIS — F331 Major depressive disorder, recurrent, moderate: Secondary | ICD-10-CM | POA: Diagnosis not present

## 2023-07-19 DIAGNOSIS — S80812D Abrasion, left lower leg, subsequent encounter: Secondary | ICD-10-CM | POA: Diagnosis not present

## 2023-07-19 DIAGNOSIS — L209 Atopic dermatitis, unspecified: Secondary | ICD-10-CM

## 2023-07-19 DIAGNOSIS — R2681 Unsteadiness on feet: Secondary | ICD-10-CM | POA: Diagnosis not present

## 2023-07-19 DIAGNOSIS — N182 Chronic kidney disease, stage 2 (mild): Secondary | ICD-10-CM | POA: Diagnosis not present

## 2023-07-19 DIAGNOSIS — I129 Hypertensive chronic kidney disease with stage 1 through stage 4 chronic kidney disease, or unspecified chronic kidney disease: Secondary | ICD-10-CM | POA: Diagnosis not present

## 2023-07-19 DIAGNOSIS — E559 Vitamin D deficiency, unspecified: Secondary | ICD-10-CM | POA: Diagnosis not present

## 2023-07-19 NOTE — Progress Notes (Signed)
Patient here today for two week Dupixent injection for severe atopic dermatitis.  Dupixent 300mg /34mL injected into right upper arm. Patient tolerated well.    LOT: [id number] EXP: 06/2025 NDC: 1478-2956-21   Dorathy Daft, RMA

## 2023-07-24 DIAGNOSIS — I5032 Chronic diastolic (congestive) heart failure: Secondary | ICD-10-CM | POA: Diagnosis not present

## 2023-07-24 DIAGNOSIS — I130 Hypertensive heart and chronic kidney disease with heart failure and stage 1 through stage 4 chronic kidney disease, or unspecified chronic kidney disease: Secondary | ICD-10-CM | POA: Diagnosis not present

## 2023-07-24 DIAGNOSIS — D631 Anemia in chronic kidney disease: Secondary | ICD-10-CM | POA: Diagnosis not present

## 2023-07-24 DIAGNOSIS — N182 Chronic kidney disease, stage 2 (mild): Secondary | ICD-10-CM | POA: Diagnosis not present

## 2023-07-24 DIAGNOSIS — E1122 Type 2 diabetes mellitus with diabetic chronic kidney disease: Secondary | ICD-10-CM | POA: Diagnosis not present

## 2023-07-24 DIAGNOSIS — E1151 Type 2 diabetes mellitus with diabetic peripheral angiopathy without gangrene: Secondary | ICD-10-CM | POA: Diagnosis not present

## 2023-07-31 DIAGNOSIS — I110 Hypertensive heart disease with heart failure: Secondary | ICD-10-CM | POA: Diagnosis not present

## 2023-07-31 DIAGNOSIS — J449 Chronic obstructive pulmonary disease, unspecified: Secondary | ICD-10-CM | POA: Diagnosis not present

## 2023-08-01 DIAGNOSIS — I130 Hypertensive heart and chronic kidney disease with heart failure and stage 1 through stage 4 chronic kidney disease, or unspecified chronic kidney disease: Secondary | ICD-10-CM | POA: Diagnosis not present

## 2023-08-01 DIAGNOSIS — E1122 Type 2 diabetes mellitus with diabetic chronic kidney disease: Secondary | ICD-10-CM | POA: Diagnosis not present

## 2023-08-01 DIAGNOSIS — I5032 Chronic diastolic (congestive) heart failure: Secondary | ICD-10-CM | POA: Diagnosis not present

## 2023-08-03 ENCOUNTER — Ambulatory Visit: Payer: Medicare Other

## 2023-08-03 DIAGNOSIS — L209 Atopic dermatitis, unspecified: Secondary | ICD-10-CM

## 2023-08-03 NOTE — Progress Notes (Signed)
 Patient here today for two week Dupixent injection for severe atopic dermatitis.  Dupixent 300mg /37mL injected into left upper arm. Patient tolerated well.    LOT: [id number] EXP: 03/2025 NDC: 8315-1761-60   Dorathy Daft, RMA

## 2023-08-16 ENCOUNTER — Ambulatory Visit: Payer: Medicare Other

## 2023-08-16 DIAGNOSIS — L209 Atopic dermatitis, unspecified: Secondary | ICD-10-CM | POA: Diagnosis not present

## 2023-08-16 NOTE — Progress Notes (Signed)
 Patient here today for two week Dupixent  injection for severe atopic dermatitis.  Dupixent  300mg /56mL injected into right upper arm. Patient tolerated well.    LOT: [id number] EXP: 06/2025 NDC: 0454-0981-19   Cooper [place], RMA

## 2023-08-30 ENCOUNTER — Other Ambulatory Visit: Payer: Self-pay | Admitting: Specialist

## 2023-08-30 ENCOUNTER — Ambulatory Visit: Payer: Medicare Other

## 2023-08-30 DIAGNOSIS — R0600 Dyspnea, unspecified: Secondary | ICD-10-CM

## 2023-08-30 DIAGNOSIS — R918 Other nonspecific abnormal finding of lung field: Secondary | ICD-10-CM

## 2023-08-31 ENCOUNTER — Ambulatory Visit (INDEPENDENT_AMBULATORY_CARE_PROVIDER_SITE_OTHER): Payer: Medicare Other

## 2023-08-31 DIAGNOSIS — L209 Atopic dermatitis, unspecified: Secondary | ICD-10-CM

## 2023-08-31 NOTE — Progress Notes (Signed)
Patient here today for two week Dupixent injection for severe atopic dermatitis.  Dupixent 300mg /66mL injected into left upper arm. Patient tolerated well.    LOT: [id number] EXP: 03/2025 NDC: 0454-0981-19   Dorathy Daft, RMA

## 2023-09-13 ENCOUNTER — Ambulatory Visit (INDEPENDENT_AMBULATORY_CARE_PROVIDER_SITE_OTHER): Payer: Medicare Other

## 2023-09-13 DIAGNOSIS — L209 Atopic dermatitis, unspecified: Secondary | ICD-10-CM | POA: Diagnosis not present

## 2023-09-13 NOTE — Progress Notes (Signed)
Patient here today for two week Dupixent injection for severe atopic dermatitis.  Dupixent 300mg /84mL injected into right upper arm. Patient tolerated well.    LOT: [id number] EXP: 03/2025 NDC: 6578-4696-29   Cari Caraway CMA, AAMA

## 2023-09-18 ENCOUNTER — Other Ambulatory Visit: Payer: Medicare Other

## 2023-09-19 ENCOUNTER — Ambulatory Visit: Payer: Medicare Other

## 2023-09-27 ENCOUNTER — Encounter: Payer: Self-pay | Admitting: Dermatology

## 2023-09-27 ENCOUNTER — Ambulatory Visit (INDEPENDENT_AMBULATORY_CARE_PROVIDER_SITE_OTHER): Payer: Medicare Other | Admitting: Dermatology

## 2023-09-27 DIAGNOSIS — L570 Actinic keratosis: Secondary | ICD-10-CM

## 2023-09-27 DIAGNOSIS — L853 Xerosis cutis: Secondary | ICD-10-CM

## 2023-09-27 DIAGNOSIS — Z79899 Other long term (current) drug therapy: Secondary | ICD-10-CM

## 2023-09-27 DIAGNOSIS — L209 Atopic dermatitis, unspecified: Secondary | ICD-10-CM

## 2023-09-27 DIAGNOSIS — Z7189 Other specified counseling: Secondary | ICD-10-CM

## 2023-09-27 DIAGNOSIS — W908XXA Exposure to other nonionizing radiation, initial encounter: Secondary | ICD-10-CM

## 2023-09-27 DIAGNOSIS — L578 Other skin changes due to chronic exposure to nonionizing radiation: Secondary | ICD-10-CM | POA: Diagnosis not present

## 2023-09-27 MED ORDER — DUPILUMAB 300 MG/2ML ~~LOC~~ SOSY
300.0000 mg | PREFILLED_SYRINGE | Freq: Once | SUBCUTANEOUS | Status: DC
Start: 2023-09-27 — End: 2023-12-25

## 2023-09-27 MED ORDER — DUPILUMAB 300 MG/2ML ~~LOC~~ SOSY
300.0000 mg | PREFILLED_SYRINGE | SUBCUTANEOUS | Status: AC
Start: 2023-09-27 — End: 2024-03-13
  Administered 2023-09-27 – 2024-03-07 (×10): 300 mg via SUBCUTANEOUS

## 2023-09-27 NOTE — Progress Notes (Signed)
 Follow-Up Visit   Subjective  Cody Bridges is a 89 y.o. male who presents for the following: Atopic Dermatitis 6 month follow up on atopic derm, currently on dupixent and mometasone cream as needed for flares. Patient reports doing well with no adverse reactions and no recent flares.  The patient has spots, moles and lesions to be evaluated, some may be new or changing and the patient may have concern these could be cancer.  The following portions of the chart were reviewed this encounter and updated as appropriate: medications, allergies, medical history  Review of Systems:  No other skin or systemic complaints except as noted in HPI or Assessment and Plan.  Objective  Well appearing patient in no apparent distress; mood and affect are within normal limits.  Areas Examined: Arms, torso, face,   Relevant physical exam findings are noted in the Assessment and Plan.  right temple x 1 Erythematous thin papules/macules with gritty scale.   Assessment & Plan   ATOPIC DERMATITIS, UNSPECIFIED TYPE   Related Medications DUPIXENT 300 MG/2ML prefilled syringe INJECT 1 SYRINGE UNDER THE SKIN EVERY OTHER WEEK dupilumab (DUPIXENT) prefilled syringe 300 mg  dupilumab (DUPIXENT) prefilled syringe 300 mg  dupilumab (DUPIXENT) prefilled syringe 300 mg  ACTINIC KERATOSIS right temple x 1 Actinic keratoses are precancerous spots that appear secondary to cumulative UV radiation exposure/sun exposure over time. They are chronic with expected duration over 1 year. A portion of actinic keratoses will progress to squamous cell carcinoma of the skin. It is not possible to reliably predict which spots will progress to skin cancer and so treatment is recommended to prevent development of skin cancer.  Recommend daily broad spectrum sunscreen SPF 30+ to sun-exposed areas, reapply every 2 hours as needed.  Recommend staying in the shade or wearing long sleeves, sun glasses (UVA+UVB protection) and  wide brim hats (4-inch brim around the entire circumference of the hat). Call for new or changing lesions. Destruction of lesion - right temple x 1 Complexity: simple   Destruction method: cryotherapy   Informed consent: discussed and consent obtained   Timeout:  patient name, date of birth, surgical site, and procedure verified Lesion destroyed using liquid nitrogen: Yes   Region frozen until ice ball extended beyond lesion: Yes   Outcome: patient tolerated procedure well with no complications   Post-procedure details: wound care instructions given   ACTINIC SKIN DAMAGE   XEROSIS CUTIS   COUNSELING AND COORDINATION OF CARE   MEDICATION MANAGEMENT     ATOPIC DERMATITIS Exam: marked xerosis at torso, arms, legs 20% BSA Chronic and persistent condition with duration or expected duration over one year. Condition is improving with treatment but not currently at goal. Atopic dermatitis - Severe, on Dupixent (biologic medication).  Atopic dermatitis (eczema) is a chronic, relapsing, pruritic condition that can significantly affect quality of life. It is often associated with allergic rhinitis and/or asthma and can require treatment with topical medications, phototherapy, or in severe cases biologic medications, which require long term medication management.   Treatment Plan: Continue Dupixent 300 mg/80mL SQ QOW. Patient denies side effects. Cont Mometasone cr qd up to 5d/wk prn flares Cont Cerave cr daily with mild soap  Dupixent 300 mg/2 ml injected SQ into the left upper arm. Patient tolerated well with no adverse reactions. Cody Bridges CMA  NDC: 4098-1191-47 Lot: [id number] Exp: 03/30/2025  Potential side effects include allergic reaction, herpes infections, injection site reactions and conjunctivitis (inflammation of the eyes).  The use of Dupixent  requires long term medication management, including periodic office visits.     Long term medication management.  Patient is using  long term (months to years) prescription medication  to control their dermatologic condition.  These medications require periodic monitoring to evaluate for efficacy and side effects and may require periodic laboratory monitoring.   Recommend gentle skin care.  Xerosis severe with peeling and flaking  Slightly worse Encourage Cerave moisturizerand mild soap  - diffuse xerotic patches - recommend gentle, hydrating skin care - gentle skin care handout given Recommend gentle skin care.  ACTINIC DAMAGE - chronic, secondary to cumulative UV radiation exposure/sun exposure over time - diffuse scaly erythematous macules with underlying dyspigmentation - Recommend daily broad spectrum sunscreen SPF 30+ to sun-exposed areas, reapply every 2 hours as needed.  - Recommend staying in the shade or wearing long sleeves, sun glasses (UVA+UVB protection) and wide brim hats (4-inch brim around the entire circumference of the hat). - Call for new or changing lesions.   Return for 4 - 6 month atopic derm follow up.  IAsher Bridges, CMA, am acting as scribe for Armida Sans, MD.   Documentation: I have reviewed the above documentation for accuracy and completeness, and I agree with the above.  Armida Sans, MD

## 2023-09-27 NOTE — Patient Instructions (Addendum)
 Actinic keratoses are precancerous spots that appear secondary to cumulative UV radiation exposure/sun exposure over time. They are chronic with expected duration over 1 year. A portion of actinic keratoses will progress to squamous cell carcinoma of the skin. It is not possible to reliably predict which spots will progress to skin cancer and so treatment is recommended to prevent development of skin cancer.  Recommend daily broad spectrum sunscreen SPF 30+ to sun-exposed areas, reapply every 2 hours as needed.  Recommend staying in the shade or wearing long sleeves, sun glasses (UVA+UVB protection) and wide brim hats (4-inch brim around the entire circumference of the hat). Call for new or changing lesions.   Cryotherapy Aftercare  Wash gently with soap and water everyday.   Apply Vaseline and Band-Aid daily until healed.    Continue Cerave moisturizer and Mild soap daily   Gentle Skin Care Guide  1. Bathe no more than once a day.  2. Avoid bathing in hot water  3. Use a mild soap like Dove, Vanicream, Cetaphil, CeraVe. Can use Lever 2000 or Cetaphil antibacterial soap  4. Use soap only where you need it. On most days, use it under your arms, between your legs, and on your feet. Let the water rinse other areas unless visibly dirty.  5. When you get out of the bath/shower, use a towel to gently blot your skin dry, don't rub it.  6. While your skin is still a little damp, apply a moisturizing cream such as Vanicream, CeraVe, Cetaphil, Eucerin, Sarna lotion or plain Vaseline Jelly. For hands apply Neutrogena [place] Hand Cream or Excipial Hand Cream.  7. Reapply moisturizer any time you start to itch or feel dry.  8. Sometimes using free and clear laundry detergents can be helpful. Fabric softener sheets should be avoided. Downy Free & Gentle liquid, or any liquid fabric softener that is free of dyes and perfumes, it acceptable to use  9. If your doctor has given you prescription  creams you may apply moisturizers over them      Due to recent changes in healthcare laws, you may see results of your pathology and/or laboratory studies on MyChart before the doctors have had a chance to review them. We understand that in some cases there may be results that are confusing or concerning to you. Please understand that not all results are received at the same time and often the doctors may need to interpret multiple results in order to provide you with the best plan of care or course of treatment. Therefore, we ask that you please give [place] 2 business days to thoroughly review all your results before contacting the office for clarification. Should we see a critical lab result, you will be contacted sooner.   If You Need Anything After Your Visit  If you have any questions or concerns for your doctor, please call our main line at [phone] and press option 4 to reach your doctor's medical assistant. If no one answers, please leave a voicemail as directed and we will return your call as soon as possible. Messages left after 4 pm will be answered the following business day.   You may also send [place] a message via MyChart. We typically respond to MyChart messages within 1-2 business days.  For prescription refills, please ask your pharmacy to contact our office. Our fax number is [phone].  If you have an urgent issue when the clinic is closed that cannot wait until the next business day, you can page your  doctor at the number below.    Please note that while we do our best to be available for urgent issues outside of office hours, we are not available 24/7.   If you have an urgent issue and are unable to reach [place], you may choose to seek medical care at your doctor's office, retail clinic, urgent care center, or emergency room.  If you have a medical emergency, please immediately call 911 or go to the emergency department.  Pager Numbers  - Dr. Gwen Pounds: [phone]  - Dr.  Roseanne Reno: [phone]  - Dr. Katrinka Blazing: [phone]   In the event of inclement weather, please call our main line at [phone] for an update on the status of any delays or closures.  Dermatology Medication Tips: Please keep the boxes that topical medications come in in order to help keep track of the instructions about where and how to use these. Pharmacies typically print the medication instructions only on the boxes and not directly on the medication tubes.   If your medication is too expensive, please contact our office at [phone] option 4 or send [place] a message through MyChart.   We are unable to tell what your co-pay for medications will be in advance as this is different depending on your insurance coverage. However, we may be able to find a substitute medication at lower cost or fill out paperwork to get insurance to cover a needed medication.   If a prior authorization is required to get your medication covered by your insurance company, please allow [place] 1-2 business days to complete this process.  Drug prices often vary depending on where the prescription is filled and some pharmacies may offer cheaper prices.  The website www.goodrx.com contains coupons for medications through different pharmacies. The prices here do not account for what the cost may be with help from insurance (it may be cheaper with your insurance), but the website can give you the price if you did not use any insurance.  - You can print the associated coupon and take it with your prescription to the pharmacy.  - You may also stop by our office during regular business hours and pick up a GoodRx coupon card.  - If you need your prescription sent electronically to a different pharmacy, notify our office through [facility] or by phone at [phone] option 4.     Si Usted Necesita Algo Despus de Su Visita  Tambin puede enviarnos un mensaje a travs de Clinical cytogeneticist. Por lo general respondemos a los  mensajes de MyChart en el transcurso de 1 a 2 das hbiles.  Para renovar recetas, por favor pida a su farmacia que se ponga en contacto con nuestra oficina. Annie Sable de fax es [place] [phone].  Si tiene un asunto urgente cuando la clnica est cerrada y que no puede esperar hasta el siguiente da hbil, puede llamar/localizar a su doctor(a) al nmero que aparece a continuacin.   Por favor, tenga en cuenta que aunque hacemos todo lo posible para estar disponibles para asuntos urgentes fuera del horario de [place], no estamos disponibles las 24 horas del da, los 7 [address]  [address] de la [place].   Si tiene un problema urgente y no puede comunicarse con nosotros, puede optar por buscar atencin mdica  en el consultorio de su doctor(a), en una clnica privada, en un centro de atencin urgente o en una sala de emergencias.  Si tiene Engineer, drilling, por favor llame inmediatamente al 911 o vaya a la sala de  emergencias.  Nmeros de bper  - Dr. Gwen Pounds: [phone]  - Dra. Roseanne Reno: [phone]  - Dr. Katrinka Blazing: [phone]   En caso de inclemencias del tiempo, por favor llame a Lacy Duverney principal al [phone] para una actualizacin sobre el [place] de cualquier retraso o cierre.  Consejos para la medicacin en dermatologa: Por favor, guarde las cajas en las que vienen los medicamentos de uso tpico para ayudarle a seguir las instrucciones sobre dnde y cmo usarlos. Las farmacias generalmente imprimen las instrucciones del medicamento slo en las cajas y no directamente en los tubos del [place].   Si su medicamento es muy caro, por favor, pngase en contacto con Rolm Gala llamando al [phone] y presione la opcin 4 o envenos un mensaje a travs de Clinical cytogeneticist.   No podemos decirle cul ser su copago por los medicamentos por adelantado ya que esto es diferente dependiendo de la cobertura de su seguro. Sin embargo, es posible que podamos encontrar un medicamento sustituto a  Audiological scientist un formulario para que el seguro cubra el medicamento que se considera necesario.   Si se requiere una autorizacin previa para que su compaa de seguros [place] su medicamento, por favor permtanos de 1 a 2 das hbiles para completar [address]  Los precios de los medicamentos varan con frecuencia dependiendo del Environmental consultant de dnde se surte la receta y alguna farmacias pueden ofrecer precios ms baratos.  El sitio web www.goodrx.com tiene cupones para medicamentos de Health and safety inspector. Los precios aqu no tienen en cuenta lo que podra costar con la ayuda del seguro (puede ser ms barato con su seguro), pero el sitio web puede darle el precio si no utiliz Tourist information centre manager.  - Puede imprimir el cupn correspondiente y llevarlo con su receta a la farmacia.  - Tambin puede pasar por nuestra oficina durante el horario de atencin regular y Education officer, museum una tarjeta de cupones de GoodRx.  - Si necesita que su receta se enve electrnicamente a una farmacia diferente, informe a nuestra oficina a travs de MyChart de [place] o por telfono llamando al [phone] y presione la opcin 4.

## 2023-10-11 ENCOUNTER — Ambulatory Visit: Payer: Medicare Other

## 2023-10-11 DIAGNOSIS — L209 Atopic dermatitis, unspecified: Secondary | ICD-10-CM | POA: Diagnosis not present

## 2023-10-11 NOTE — Progress Notes (Signed)
 Patient here today for two week Dupixent injection for severe atopic dermatitis.  Dupixent 300mg /55mL injected into right upper arm. Patient tolerated well.    LOT: [id number] EXP: 03/2025 NDC: 9147-8295-62   Evorn Gong RMA

## 2023-10-12 ENCOUNTER — Telehealth: Payer: Self-pay | Admitting: *Deleted

## 2023-10-12 NOTE — Telephone Encounter (Signed)
 Daughter informed

## 2023-10-12 NOTE — Telephone Encounter (Signed)
 Here you go

## 2023-10-25 ENCOUNTER — Ambulatory Visit

## 2023-10-26 ENCOUNTER — Ambulatory Visit (INDEPENDENT_AMBULATORY_CARE_PROVIDER_SITE_OTHER)

## 2023-10-26 DIAGNOSIS — L209 Atopic dermatitis, unspecified: Secondary | ICD-10-CM

## 2023-10-26 NOTE — Progress Notes (Signed)
 Patient here today for two week Dupixent injection for severe atopic dermatitis.  Dupixent 300mg /66mL injected into left upper arm. Patient tolerated well.    LOT: [id number] EXP: 03/2025 NDC: 0454-0981-19   Dorathy Daft, RMA

## 2023-11-06 IMAGING — CT CT CHEST W/O CM
2 of 4 series · 15 of 36 positions shown, 18 images · non-contrast
Comparison: Previous studies including chest radiograph done on
02/13/2021 and CT done on 10/05/2017.

CLINICAL DATA: Lung nodule



[Series 2: chest 2.00 · axial · 0.70mm/px · z∈[-1176,-902]mm · 12 of 163 slices shown, 15 images]
[im 13/163  mediastinal]
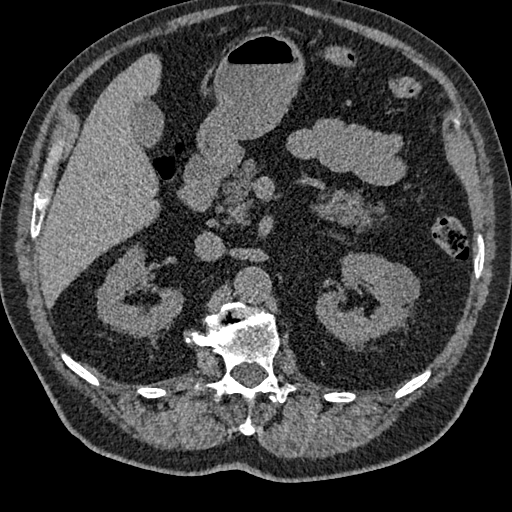
[im 13/163  lung]
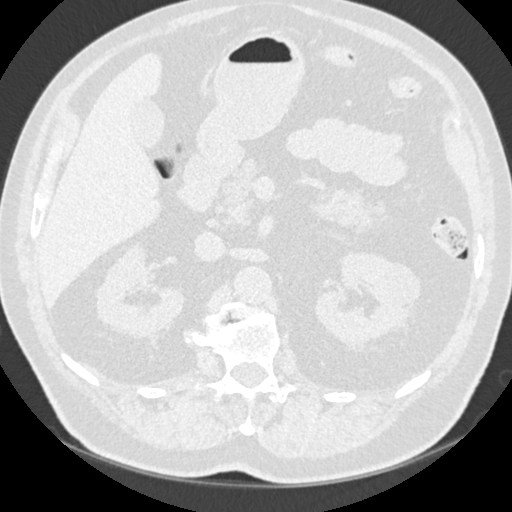
[im 25/163  lung]
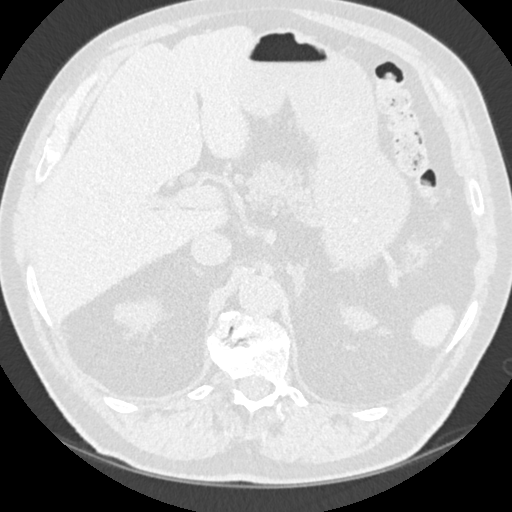
[im 38/163  lung]
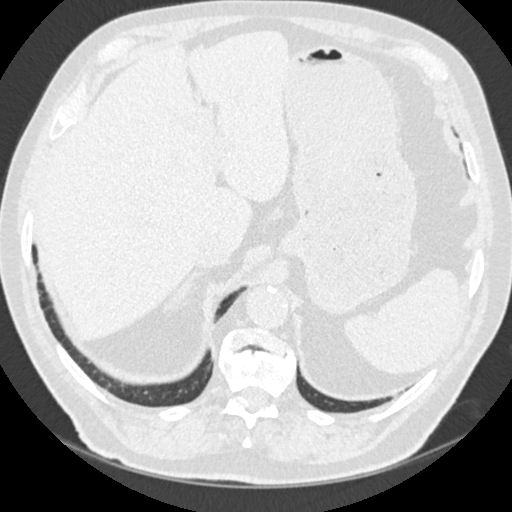
[im 50/163  lung]
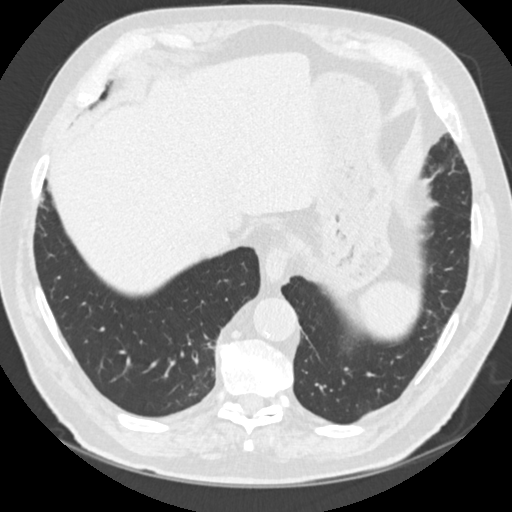
[im 63/163  mediastinal]
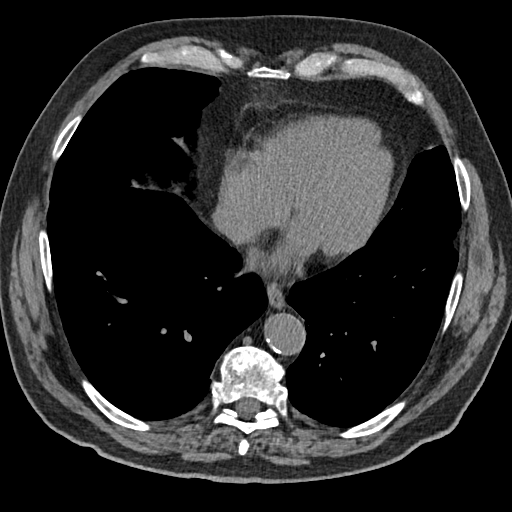
[im 63/163  lung]
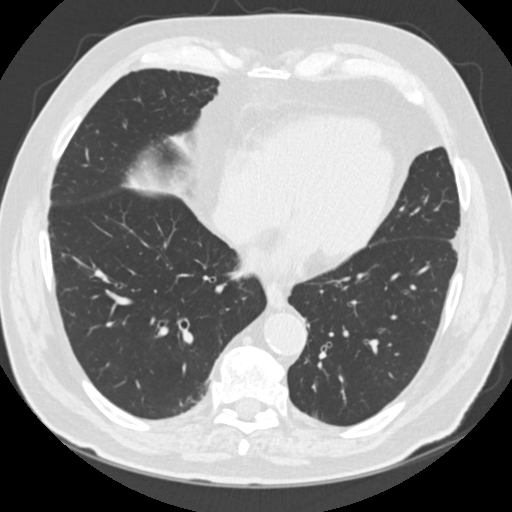
[im 75/163  lung]
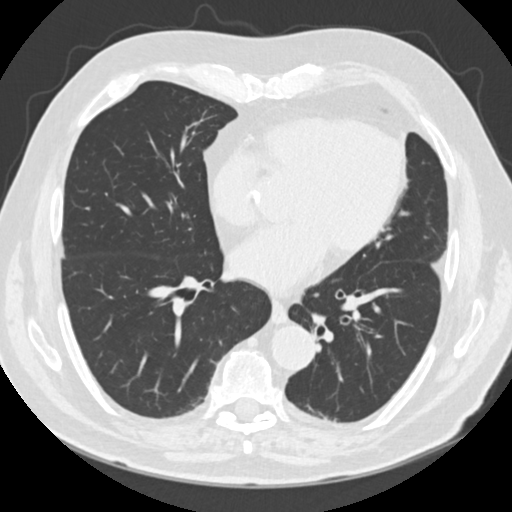
[im 88/163  lung]
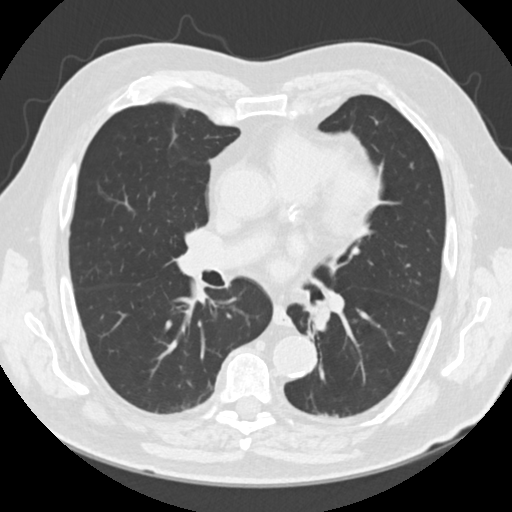
[im 100/163  lung]
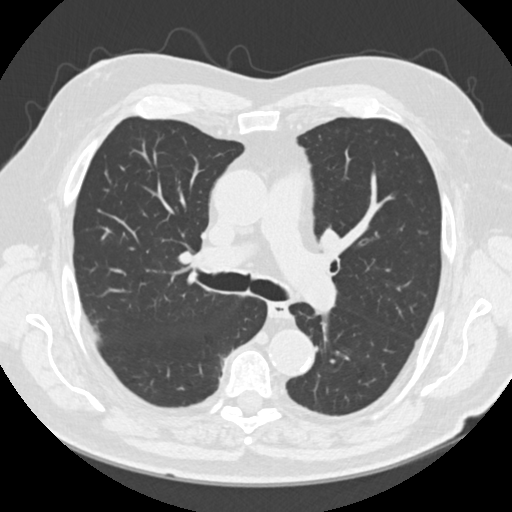
[im 113/163  mediastinal]
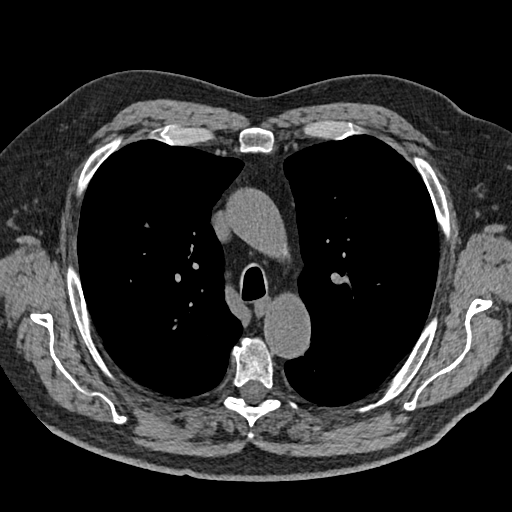
[im 113/163  lung]
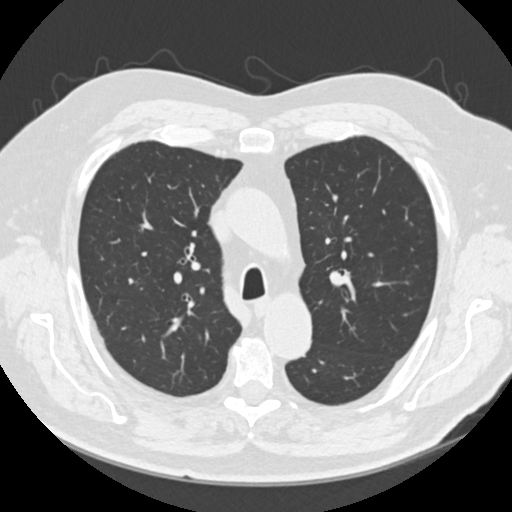
[im 125/163  lung]
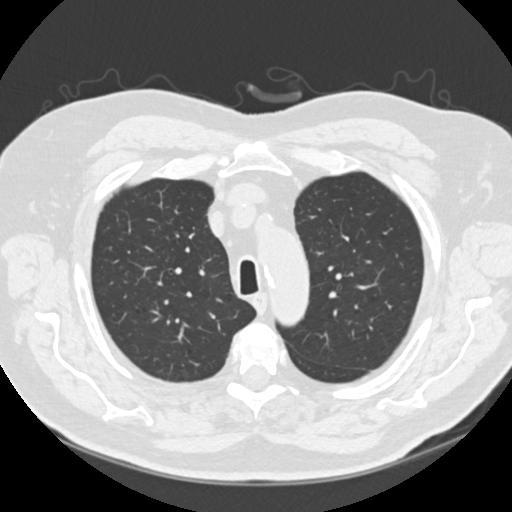
[im 138/163  lung]
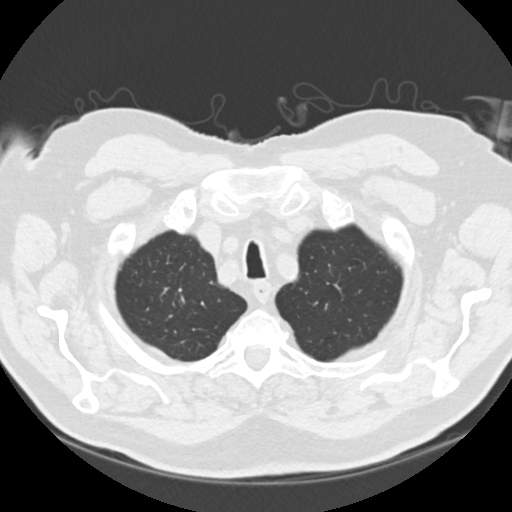
[im 150/163  lung]
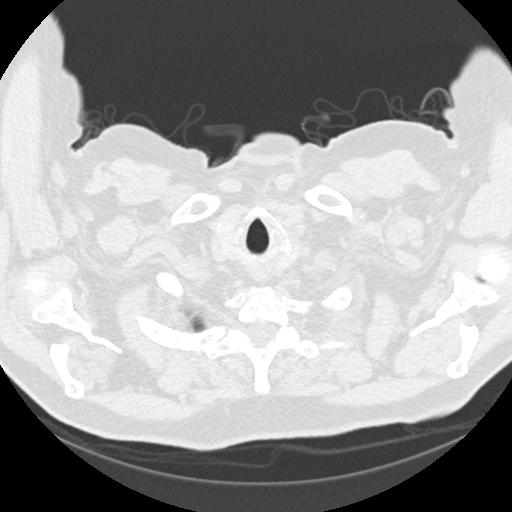

[Series 5: coronals chest 2.00 cor · coronal · 0.64mm/px · 3 of 178 slices shown]
[im 36/178  lung]
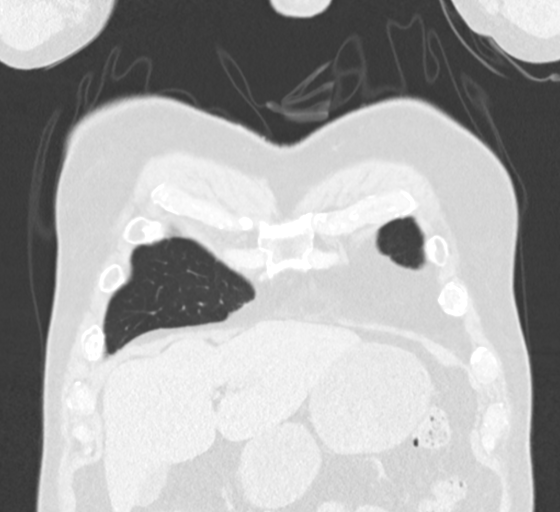
[im 71/178  lung]
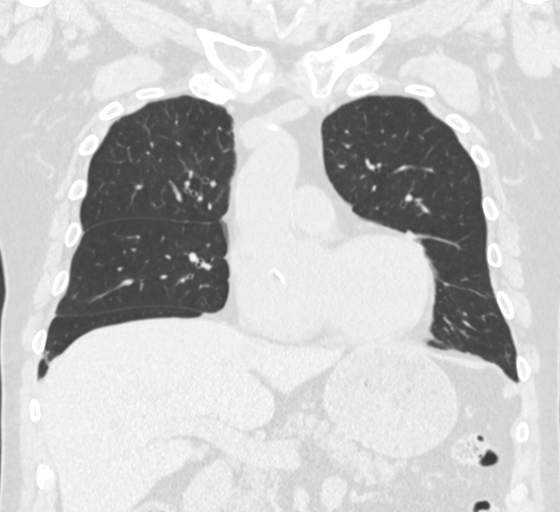
[im 107/178  lung]
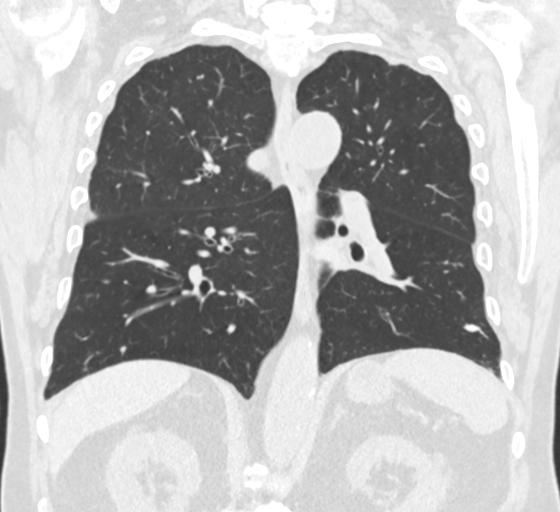

[15 of 36 positions shown; findings below may reference images not displayed]

FINDINGS: Cardiovascular: There are scattered coronary artery calcifications.
There are scattered calcifications in the thoracic aorta. There is
ectasia of ascending thoracic aorta measuring 3.9 cm.

Mediastinum/Nodes: No new significant lymphadenopathy seen.

Lungs/Pleura: There is interval resolution of 11 x 7 mm nodule seen
in the anterior left mid lung fields. There is minimal residual
scarring in the same region. In image 105 of series 3, there is 9 x
6 mm nodule in the left lower lobe which measured approximately the
same size in the previous study. In image 104, is 3 mm nodule in
left lower lobe which has not changed. There are no new nodules or
new infiltrates in the lung fields. There is no new focal pulmonary
infiltrate. Minimal scarring is seen in the medial aspects of both
lower lung fields with no significant interval change. There is no
pleural effusion or pneumothorax.

Upper Abdomen: Unremarkable.

Musculoskeletal: Degenerative changes are noted in the lower
thoracic spine with posterior bony spurs causing extrinsic pressure
over the ventral margin of thecal sac from T11-L2 levels.
IMPRESSION: There is interval resolution of 11 x 7 mm nodule in the left upper
lobe. There are 2 small nodular densities in the left lower lobe
which appear stable. There are no new nodules or new infiltrates in
the lung fields.

Other findings as described in the body of the report.

## 2023-11-08 ENCOUNTER — Ambulatory Visit

## 2023-11-09 ENCOUNTER — Ambulatory Visit (INDEPENDENT_AMBULATORY_CARE_PROVIDER_SITE_OTHER)

## 2023-11-09 ENCOUNTER — Ambulatory Visit

## 2023-11-09 DIAGNOSIS — L209 Atopic dermatitis, unspecified: Secondary | ICD-10-CM | POA: Diagnosis not present

## 2023-11-09 NOTE — Progress Notes (Signed)
 Patient here today for two week Dupixent injection for severe atopic dermatitis.  Dupixent 300mg /45mL injected into right upper arm. Patient tolerated well.    LOT: [id number] EXP: 03/2025 NDC: 4259-5638-75   Dorathy Daft, RMA

## 2023-11-22 ENCOUNTER — Ambulatory Visit

## 2023-11-22 DIAGNOSIS — L209 Atopic dermatitis, unspecified: Secondary | ICD-10-CM | POA: Diagnosis not present

## 2023-11-22 NOTE — Progress Notes (Signed)
 Patient here today for two week Dupixent  injection for severe atopic dermatitis.   Dupixent  300mg /13mL injected into left upper arm. Patient tolerated well.    LOT: [id number] EXP: 11/2025 NDC: 2536-6440-34   Cooper Denver, RMA

## 2023-11-23 ENCOUNTER — Ambulatory Visit

## 2023-12-06 ENCOUNTER — Ambulatory Visit

## 2023-12-06 DIAGNOSIS — L209 Atopic dermatitis, unspecified: Secondary | ICD-10-CM

## 2023-12-06 NOTE — Progress Notes (Signed)
 Patient here today for two week Dupixent  injection for severe atopic dermatitis.    Dupixent  300mg /48mL injected into RIGHT upper arm. Patient tolerated well.    LOT: [id number] EXP: 11/2025 NDC: 0454-0981-19   Cooper Denver, RMA

## 2023-12-19 ENCOUNTER — Emergency Department

## 2023-12-19 ENCOUNTER — Other Ambulatory Visit: Payer: Self-pay

## 2023-12-19 ENCOUNTER — Inpatient Hospital Stay
Admission: EM | Admit: 2023-12-19 | Discharge: 2023-12-25 | DRG: 552 | Disposition: A | Discharge status: Skilled Nursing Facility | Source: Skilled Nursing Facility | Attending: Obstetrics and Gynecology | Admitting: Obstetrics and Gynecology

## 2023-12-19 DIAGNOSIS — R0602 Shortness of breath: Secondary | ICD-10-CM | POA: Diagnosis not present

## 2023-12-19 DIAGNOSIS — Z9841 Cataract extraction status, right eye: Secondary | ICD-10-CM

## 2023-12-19 DIAGNOSIS — N179 Acute kidney failure, unspecified: Secondary | ICD-10-CM | POA: Diagnosis present

## 2023-12-19 DIAGNOSIS — J189 Pneumonia, unspecified organism: Secondary | ICD-10-CM | POA: Diagnosis not present

## 2023-12-19 DIAGNOSIS — J449 Chronic obstructive pulmonary disease, unspecified: Secondary | ICD-10-CM | POA: Diagnosis present

## 2023-12-19 DIAGNOSIS — N400 Enlarged prostate without lower urinary tract symptoms: Secondary | ICD-10-CM | POA: Diagnosis present

## 2023-12-19 DIAGNOSIS — I5032 Chronic diastolic (congestive) heart failure: Secondary | ICD-10-CM | POA: Diagnosis present

## 2023-12-19 DIAGNOSIS — E119 Type 2 diabetes mellitus without complications: Secondary | ICD-10-CM

## 2023-12-19 DIAGNOSIS — E785 Hyperlipidemia, unspecified: Secondary | ICD-10-CM | POA: Diagnosis present

## 2023-12-19 DIAGNOSIS — R4189 Other symptoms and signs involving cognitive functions and awareness: Secondary | ICD-10-CM | POA: Diagnosis present

## 2023-12-19 DIAGNOSIS — Z888 Allergy status to other drugs, medicaments and biological substances status: Secondary | ICD-10-CM

## 2023-12-19 DIAGNOSIS — E669 Obesity, unspecified: Secondary | ICD-10-CM | POA: Diagnosis present

## 2023-12-19 DIAGNOSIS — Z9842 Cataract extraction status, left eye: Secondary | ICD-10-CM

## 2023-12-19 DIAGNOSIS — I110 Hypertensive heart disease with heart failure: Secondary | ICD-10-CM | POA: Diagnosis present

## 2023-12-19 DIAGNOSIS — Z8616 Personal history of COVID-19: Secondary | ICD-10-CM

## 2023-12-19 DIAGNOSIS — Z79899 Other long term (current) drug therapy: Secondary | ICD-10-CM

## 2023-12-19 DIAGNOSIS — M48062 Spinal stenosis, lumbar region with neurogenic claudication: Secondary | ICD-10-CM | POA: Diagnosis not present

## 2023-12-19 DIAGNOSIS — Z961 Presence of intraocular lens: Secondary | ICD-10-CM | POA: Diagnosis present

## 2023-12-19 DIAGNOSIS — I10 Essential (primary) hypertension: Secondary | ICD-10-CM | POA: Diagnosis present

## 2023-12-19 DIAGNOSIS — F0390 Unspecified dementia without behavioral disturbance: Secondary | ICD-10-CM | POA: Diagnosis present

## 2023-12-19 DIAGNOSIS — K219 Gastro-esophageal reflux disease without esophagitis: Secondary | ICD-10-CM | POA: Diagnosis present

## 2023-12-19 DIAGNOSIS — Z8673 Personal history of transient ischemic attack (TIA), and cerebral infarction without residual deficits: Secondary | ICD-10-CM

## 2023-12-19 DIAGNOSIS — Z7951 Long term (current) use of inhaled steroids: Secondary | ICD-10-CM

## 2023-12-19 DIAGNOSIS — Z7984 Long term (current) use of oral hypoglycemic drugs: Secondary | ICD-10-CM

## 2023-12-19 DIAGNOSIS — Z881 Allergy status to other antibiotic agents status: Secondary | ICD-10-CM

## 2023-12-19 DIAGNOSIS — R5381 Other malaise: Secondary | ICD-10-CM | POA: Diagnosis present

## 2023-12-19 DIAGNOSIS — Z880 Allergy status to penicillin: Secondary | ICD-10-CM

## 2023-12-19 DIAGNOSIS — R29898 Other symptoms and signs involving the musculoskeletal system: Secondary | ICD-10-CM | POA: Diagnosis present

## 2023-12-19 DIAGNOSIS — Z87891 Personal history of nicotine dependence: Secondary | ICD-10-CM

## 2023-12-19 DIAGNOSIS — Z6830 Body mass index (BMI) 30.0-30.9, adult: Secondary | ICD-10-CM

## 2023-12-19 LAB — URINALYSIS, ROUTINE W REFLEX MICROSCOPIC
Bilirubin Urine: NEGATIVE
Glucose, UA: NEGATIVE mg/dL
Hgb urine dipstick: NEGATIVE
Ketones, ur: NEGATIVE mg/dL
Leukocytes,Ua: NEGATIVE
Nitrite: NEGATIVE
Protein, ur: 100 mg/dL — AB
Specific Gravity, Urine: 1.019 (ref 1.005–1.030)
pH: 5 (ref 5.0–8.0)

## 2023-12-19 LAB — BASIC METABOLIC PANEL WITH GFR
Anion gap: 15 (ref 5–15)
BUN: 41 mg/dL — ABNORMAL HIGH (ref 8–23)
CO2: 26 mmol/L (ref 22–32)
Calcium: 9.7 mg/dL (ref 8.9–10.3)
Chloride: 98 mmol/L (ref 98–111)
Creatinine, Ser: 1.39 mg/dL — ABNORMAL HIGH (ref 0.61–1.24)
GFR, Estimated: 49 mL/min — ABNORMAL LOW (ref >60)
Glucose, Bld: 157 mg/dL — ABNORMAL HIGH (ref 70–99)
Potassium: 3.9 mmol/L (ref 3.5–5.1)
Sodium: 139 mmol/L (ref 135–145)

## 2023-12-19 LAB — CBC
HCT: 40.9 % (ref 39.0–52.0)
Hemoglobin: 13 g/dL (ref 13.0–17.0)
MCH: 30.7 pg (ref 26.0–34.0)
MCHC: 31.8 g/dL (ref 30.0–36.0)
MCV: 96.5 fL (ref 80.0–100.0)
Platelets: 351 10*3/uL (ref 150–400)
RBC: 4.24 MIL/uL (ref 4.22–5.81)
RDW: 13.3 % (ref 11.5–15.5)
WBC: 11.3 10*3/uL — ABNORMAL HIGH (ref 4.0–10.5)
nRBC: 0 % (ref 0.0–0.2)

## 2023-12-19 LAB — GLUCOSE, CAPILLARY: Glucose-Capillary: 179 mg/dL — ABNORMAL HIGH (ref 70–99)

## 2023-12-19 LAB — TROPONIN I (HIGH SENSITIVITY): Troponin I (High Sensitivity): 9 ng/L (ref <18)

## 2023-12-19 MED ORDER — INSULIN ASPART 100 UNIT/ML IJ SOLN
0.0000 [IU] | Freq: Every day | INTRAMUSCULAR | Status: DC
  Administered 2023-12-20: 3 [IU] via SUBCUTANEOUS
  Administered 2023-12-21 – 2023-12-23 (×3): 2 [IU] via SUBCUTANEOUS
  Filled 2023-12-19 (×4): qty 1

## 2023-12-19 MED ORDER — ONDANSETRON HCL 4 MG PO TABS: 4.0000 mg | ORAL_TABLET | Freq: Four times a day (QID) | ORAL | Status: DC | PRN

## 2023-12-19 MED ORDER — ONDANSETRON HCL 4 MG/2ML IJ SOLN: 4.0000 mg | Freq: Four times a day (QID) | INTRAMUSCULAR | Status: DC | PRN

## 2023-12-19 MED ORDER — ACETAMINOPHEN 500 MG PO TABS: 1000.0000 mg | ORAL_TABLET | Freq: Four times a day (QID) | ORAL | Status: DC | PRN

## 2023-12-19 MED ORDER — SALINE SPRAY 0.65 % NA SOLN: 1.0000 | NASAL | Status: DC | PRN

## 2023-12-19 MED ORDER — ACETAMINOPHEN 650 MG RE SUPP: 650.0000 mg | Freq: Four times a day (QID) | RECTAL | Status: DC | PRN

## 2023-12-19 MED ORDER — CARBOXYMETHYLCELLULOSE SODIUM 0.5 % OP SOLN: 1.0000 [drp] | Freq: Three times a day (TID) | OPHTHALMIC | Status: DC | PRN

## 2023-12-19 MED ORDER — SODIUM CHLORIDE 0.9 % IV SOLN
1.0000 g | INTRAVENOUS | Status: DC
  Administered 2023-12-19 – 2023-12-20 (×2): 1 g via INTRAVENOUS
  Filled 2023-12-19 (×3): qty 10

## 2023-12-19 MED ORDER — METHYLPREDNISOLONE SODIUM SUCC 40 MG IJ SOLR
40.0000 mg | Freq: Every day | INTRAMUSCULAR | Status: DC
  Administered 2023-12-19 – 2023-12-21 (×3): 40 mg via INTRAVENOUS
  Filled 2023-12-19 (×3): qty 1

## 2023-12-19 MED ORDER — IPRATROPIUM-ALBUTEROL 0.5-2.5 (3) MG/3ML IN SOLN
3.0000 mL | Freq: Four times a day (QID) | RESPIRATORY_TRACT | Status: DC
  Filled 2023-12-19: qty 3

## 2023-12-19 MED ORDER — SODIUM CHLORIDE 0.9 % IV SOLN
500.0000 mg | INTRAVENOUS | Status: DC
  Administered 2023-12-20: 500 mg via INTRAVENOUS
  Filled 2023-12-19 (×2): qty 5

## 2023-12-19 MED ORDER — HYDROCHLOROTHIAZIDE 25 MG PO TABS: 25.0000 mg | ORAL_TABLET | Freq: Every day | ORAL | Status: DC

## 2023-12-19 MED ORDER — SODIUM CHLORIDE 0.9 % IV SOLN
1.0000 g | Freq: Once | INTRAVENOUS | Status: AC
  Administered 2023-12-19: 1 g via INTRAVENOUS
  Filled 2023-12-19: qty 10

## 2023-12-19 MED ORDER — ENOXAPARIN SODIUM 60 MG/0.6ML IJ SOSY
50.0000 mg | PREFILLED_SYRINGE | INTRAMUSCULAR | Status: DC
  Administered 2023-12-19 – 2023-12-24 (×6): 50 mg via SUBCUTANEOUS
  Filled 2023-12-19 (×6): qty 0.6

## 2023-12-19 MED ORDER — IPRATROPIUM-ALBUTEROL 0.5-2.5 (3) MG/3ML IN SOLN
3.0000 mL | Freq: Once | RESPIRATORY_TRACT | Status: AC
  Administered 2023-12-19: 3 mL via RESPIRATORY_TRACT
  Filled 2023-12-19: qty 3

## 2023-12-19 MED ORDER — INSULIN ASPART 100 UNIT/ML IJ SOLN
0.0000 [IU] | Freq: Three times a day (TID) | INTRAMUSCULAR | Status: DC
  Administered 2023-12-20: 3 [IU] via SUBCUTANEOUS
  Administered 2023-12-20: 2 [IU] via SUBCUTANEOUS
  Administered 2023-12-20: 1 [IU] via SUBCUTANEOUS
  Administered 2023-12-21: 5 [IU] via SUBCUTANEOUS
  Administered 2023-12-21 – 2023-12-23 (×4): 1 [IU] via SUBCUTANEOUS
  Administered 2023-12-23 (×2): 2 [IU] via SUBCUTANEOUS
  Administered 2023-12-24: 3 [IU] via SUBCUTANEOUS
  Administered 2023-12-24: 1 [IU] via SUBCUTANEOUS
  Administered 2023-12-24 – 2023-12-25 (×3): 2 [IU] via SUBCUTANEOUS
  Filled 2023-12-19 (×15): qty 1

## 2023-12-19 MED ORDER — ROSUVASTATIN CALCIUM 20 MG PO TABS
20.0000 mg | ORAL_TABLET | Freq: Every day | ORAL | Status: DC
  Administered 2023-12-20 – 2023-12-25 (×6): 20 mg via ORAL
  Filled 2023-12-19 (×5): qty 1
  Filled 2023-12-19: qty 2

## 2023-12-19 MED ORDER — POLYVINYL ALCOHOL 1.4 % OP SOLN
1.0000 [drp] | Freq: Three times a day (TID) | OPHTHALMIC | Status: DC | PRN
Start: 2023-12-19 — End: 2023-12-25

## 2023-12-19 MED ORDER — IPRATROPIUM-ALBUTEROL 0.5-2.5 (3) MG/3ML IN SOLN
3.0000 mL | Freq: Four times a day (QID) | RESPIRATORY_TRACT | Status: DC
  Administered 2023-12-19 – 2023-12-20 (×3): 3 mL via RESPIRATORY_TRACT
  Filled 2023-12-19 (×2): qty 3

## 2023-12-19 MED ORDER — SODIUM CHLORIDE 0.9 % IV SOLN: 500.0000 mg | INTRAVENOUS | Status: DC

## 2023-12-19 MED ORDER — ACETAMINOPHEN 325 MG PO TABS: 650.0000 mg | ORAL_TABLET | Freq: Four times a day (QID) | ORAL | Status: DC | PRN

## 2023-12-19 MED ORDER — TAMSULOSIN HCL 0.4 MG PO CAPS
0.4000 mg | ORAL_CAPSULE | Freq: Every day | ORAL | Status: DC
Start: 2023-12-20 — End: 2023-12-25
  Administered 2023-12-20 – 2023-12-25 (×6): 0.4 mg via ORAL
  Filled 2023-12-19 (×6): qty 1

## 2023-12-19 MED ORDER — ENSURE ENLIVE PO LIQD
237.0000 mL | Freq: Two times a day (BID) | ORAL | Status: DC
  Administered 2023-12-20 – 2023-12-25 (×10): 237 mL via ORAL

## 2023-12-19 MED ORDER — LATANOPROST 0.005 % OP SOLN
1.0000 [drp] | Freq: Every day | OPHTHALMIC | Status: DC
  Administered 2023-12-20 – 2023-12-24 (×5): 1 [drp] via OPHTHALMIC
  Filled 2023-12-19: qty 2.5

## 2023-12-19 MED ORDER — MONTELUKAST SODIUM 10 MG PO TABS
10.0000 mg | ORAL_TABLET | Freq: Every day | ORAL | Status: DC
  Administered 2023-12-20 – 2023-12-24 (×5): 10 mg via ORAL
  Filled 2023-12-19 (×6): qty 1

## 2023-12-19 MED ORDER — DM-GUAIFENESIN ER 30-600 MG PO TB12: 1.0000 | ORAL_TABLET | Freq: Two times a day (BID) | ORAL | Status: DC | PRN

## 2023-12-19 MED ORDER — SODIUM CHLORIDE 0.9 % IV SOLN
500.0000 mg | Freq: Once | INTRAVENOUS | Status: AC
  Administered 2023-12-19: 500 mg via INTRAVENOUS
  Filled 2023-12-19: qty 5

## 2023-12-19 MED ORDER — BUDESONIDE 0.25 MG/2ML IN SUSP
0.2500 mg | Freq: Two times a day (BID) | RESPIRATORY_TRACT | Status: DC
  Administered 2023-12-19 – 2023-12-21 (×4): 0.25 mg via RESPIRATORY_TRACT
  Filled 2023-12-19 (×4): qty 2

## 2023-12-19 MED ORDER — AMLODIPINE BESYLATE 5 MG PO TABS
5.0000 mg | ORAL_TABLET | Freq: Every day | ORAL | Status: DC
  Administered 2023-12-20 – 2023-12-25 (×6): 5 mg via ORAL
  Filled 2023-12-19 (×6): qty 1

## 2023-12-19 MED ORDER — FINASTERIDE 5 MG PO TABS
5.0000 mg | ORAL_TABLET | Freq: Every day | ORAL | Status: DC
  Administered 2023-12-20 – 2023-12-25 (×6): 5 mg via ORAL
  Filled 2023-12-19 (×6): qty 1

## 2023-12-19 MED ORDER — POLYETHYLENE GLYCOL 3350 17 G PO PACK
17.0000 g | PACK | Freq: Every day | ORAL | Status: DC
  Administered 2023-12-20 – 2023-12-25 (×5): 17 g via ORAL
  Filled 2023-12-19 (×6): qty 1

## 2023-12-19 NOTE — ED Triage Notes (Addendum)
 Pt arrives via [facility] from [place] with c/o sudden onset SOB and near syncopal episode after using the restroom. Per EMS pt's knees locked after standing and almost collapsed. EMS states that pt was having intermittent wheezing. Pt received a neb treatment prior to EMS arrival and pt O2 was between 90-95% RA. Pt received 125mg  of solumedrol en route to ED. Pt is speaking in full sentences with no issues and appears in NAD.

## 2023-12-19 NOTE — ED Notes (Addendum)
 Tech helped pt use urinal with no output. Tech and RN Odilia Bennett changed this pt.

## 2023-12-19 NOTE — Group Note (Deleted)
 Date:  12/19/2023 Time:  9:45 PM  Group Topic/Focus:  Wrap-Up Group:   The focus of this group is to help patients review their daily goal of treatment and discuss progress on daily workbooks.     Participation Level:  {BHH PARTICIPATION EAVWU:98119}  Participation Quality:  {BHH PARTICIPATION QUALITY:22265}  Affect:  {BHH AFFECT:22266}  Cognitive:  {BHH COGNITIVE:22267}  Insight: {BHH Insight2:20797}  Engagement in Group:  {BHH ENGAGEMENT IN JYNWG:95621}  Modes of Intervention:  {BHH MODES OF INTERVENTION:22269}  Additional Comments:  ***  Maglione,Preciosa Bundrick E 12/19/2023, 9:45 PM

## 2023-12-19 NOTE — H&P (Signed)
 History and Physical    Patient: Cody Bridges UEA:[id number] DOB: 12/30/1932 DOA: 12/19/2023 DOS: the patient was seen and examined on 12/19/2023 PCP: Nadine Aus, NP  Patient coming from: [facility]   Chief Complaint:  Chief Complaint  Patient presents with   Shortness of Breath   Near Syncope   HPI: Cody Bridges is a 89 y.o. male presenting to the emergency department today following a near syncopal episode getting off the toilet at facility.  Patient reports his knees buckled and gave out when he tried to get up.  He reports associated dizziness and shortness of breath that started around the same time. Denies LOC or fall.  On EMS arrival, patient was apparently wheezing and was placed on 2 L of nasal cannula for saturations noted in the low 90%'s.  He received Solu-Medrol  en route.  By the time my exam, patient had been weaned back to room air and reported resolution of his respiratory symptoms. He was afebrile with white count of 11, hemodynamically stable.  Chemistry fairly unremarkable except for a slight increase in his creatinine to 1.4 from baseline about 1.1.  He reports the urge to void but states he is unable to.   Chest x-ray with low lung volumes and patchy medial right lower and left basilar opacities. CT head without acute intracranial abnormality was significant for advanced cerebral atrophy. Admission was requested for further management.  Review of Systems: Review of Systems  Constitutional:  Negative for diaphoresis, fever, malaise/fatigue and weight loss.  Eyes:  Negative for blurred vision and double vision.  Cardiovascular:  Negative for chest pain, palpitations and orthopnea.  Gastrointestinal:  Positive for constipation. Negative for diarrhea and vomiting.  Genitourinary:  Negative for frequency and urgency.  Skin:  Negative for rash.  Neurological:  Positive for dizziness and weakness. Negative for loss of consciousness.    Past Medical History:   Diagnosis Date   Actinic keratosis    Arthritis    Asthma    COPD (chronic obstructive pulmonary disease) (HCC)    COVID-19 02/13/2021   Diabetes mellitus without complication (HCC)     2015   Dyspnea    with exertion   Edema    GERD (gastroesophageal reflux disease)    HOH (hard of hearing)    AIDS   Hyperlipidemia    Hypertension    Wheezing    Past Surgical History:  Procedure Laterality Date   BUNIONECTOMY Right    CATARACT EXTRACTION W/PHACO Left 08/22/2017   Procedure: CATARACT EXTRACTION PHACO AND INTRAOCULAR LENS PLACEMENT (IOC);  Surgeon: Clair Crews, MD;  Location: [facility] ORS;  Service: Ophthalmology;  Laterality: Left;  US  00:43.7 AP% 12.3 CDE 5.38 Fluid pack lot # [id number]   COLONOSCOPY  2016   ELECTROMAGNETIC NAVIGATION BROCHOSCOPY N/A 02/09/2017   Procedure: ELECTROMAGNETIC NAVIGATION BRONCHOSCOPY;  Surgeon: Cleve Dale, MD;  Location: [facility] ORS;  Service: Cardiopulmonary;  Laterality: N/A;   EYE SURGERY Right    Cataract Extraction with IOL   HAMMER TOE SURGERY Right    TONSILLECTOMY  1938   Social History:  reports that he quit smoking about 38 years ago. His smoking use included cigarettes. He started smoking about 73 years ago. He has a 70 pack-year smoking history. He has never used smokeless tobacco. He reports current alcohol  use. He reports that he does not use drugs.  Allergies  Allergen Reactions   Clindamycin Hives   Lisinopril Cough   Penicillin V Potassium Rash and Other (See Comments)  Has patient had a PCN reaction causing immediate rash, facial/tongue/throat swelling, SOB or lightheadedness with hypotension: Unknown Has patient had a PCN reaction causing severe rash involving mucus membranes or skin necrosis: Unknown Has patient had a PCN reaction that required hospitalization: No Has patient had a PCN reaction occurring within the last 10 years: No If all of the above answers are "NO", then may proceed with Cephalosporin use.      Family History  Problem Relation Age of Onset   Cancer Mother    Stomach cancer Mother    Stomach cancer Sister     Prior to Admission medications   Medication Sig Start Date End Date Taking? Authorizing Provider  Accu-Chek FastClix Lancets MISC Use up to Once daily as Directed  DX:  E11.9 12/01/21   Kent Pear, MD  ACCU-CHEK GUIDE test strip USE UP TO ONCE DAILY AS DIRECTED DX: E11.9 12/31/21   Kent Pear, MD  acetaminophen  (TYLENOL ) 500 MG tablet Take 1,000 mg by mouth every 6 (six) hours as needed for mild pain or moderate pain.    [provider]  albuterol  (VENTOLIN  HFA) 108 (90 Base) MCG/ACT inhaler INHALE 1-2 PUFFS BY MOUTH EVERY 6 HOURS AS NEEDED FOR WHEEZE OR SHORTNESS OF BREATH 10/17/22   Kent Pear, MD  albuterol  (VENTOLIN  HFA) 108 (90 Base) MCG/ACT inhaler Inhale 2 puffs into the lungs every 6 (six) hours as needed for wheezing or shortness of breath. 04/28/23   Assaker, Marianne Shirts, MD  amLODipine  (NORVASC ) 5 MG tablet Take 5 mg by mouth daily. 05/14/20   [provider]  Azelastine  HCl 137 MCG/SPRAY SOLN PLACE 2 SPRAYS INTO BOTH NOSTRILS 2 (TWO) TIMES DAILY. USE IN EACH NOSTRIL AS DIRECTED 08/02/22   Kent Pear, MD  blood glucose meter kit and supplies KIT Dispense based on patient and insurance preference. Use once daily as directed. (FOR ICD-10  E11.9). 05/31/21   Kent Pear, MD  calcium  carbonate (TUMS - DOSED IN MG ELEMENTAL CALCIUM ) 500 MG chewable tablet Chew 1-2 tablets by mouth 3 (three) times daily as needed (for acid reflux/indigestion.).    [provider]  carboxymethylcellulose (REFRESH PLUS) 0.5 % SOLN Apply 1 drop to eye 3 (three) times daily as needed (dry eyes).    [provider]  Cholecalciferol  (VITAMIN D -3) 5000 units TABS Take 5,000 Units by mouth daily.     [provider]  dextromethorphan -guaiFENesin  (MUCINEX  DM) 30-600 MG 12hr tablet Take 1 tablet by mouth 2 (two) times daily  as needed for cough. 11/08/22   Patel, Sona, MD  DUPIXENT  300 MG/2ML prefilled syringe INJECT 1 SYRINGE UNDER THE SKIN EVERY OTHER WEEK 10/27/22   Elta Halter, MD  Emollient (CERAVE DAILY MOISTURIZING) LOTN Apply 1 application topically daily as needed (dry skin).    [provider]  feeding supplement (ENSURE ENLIVE / ENSURE PLUS) LIQD Take 237 mLs by mouth 2 (two) times daily between meals. 11/08/22   Patel, Sona, MD  finasteride  (PROSCAR ) 5 MG tablet Take 1 tablet (5 mg total) by mouth daily. 06/27/22   Vaillancourt, Samantha, PA-C  Fluticasone -Umeclidin-Vilant (TRELEGY ELLIPTA ) 100-62.5-25 MCG/ACT AEPB Inhale 1 puff into the lungs daily. 11/08/22   Patel, Sona, MD  hydrochlorothiazide  (HYDRODIURIL ) 25 MG tablet TAKE 1 TABLET BY MOUTH EVERY DAY 08/22/22   Kent Pear, MD  ipratropium-albuterol  (DUONEB) 0.5-2.5 (3) MG/3ML SOLN Take 3 mLs by nebulization every 4 (four) hours as needed. 11/08/22   Patel, Sona, MD  latanoprost  (XALATAN ) 0.005 %  ophthalmic solution Place 1 drop into the left eye at bedtime.    [provider]  metFORMIN  (GLUCOPHAGE ) 500 MG tablet TAKE 1 TABLET BY MOUTH TWICE A DAY 08/29/22   Kent Pear, MD  mometasone -formoterol  (DULERA ) 100-5 MCG/ACT AERO Inhale 2 puffs into the lungs 2 (two) times daily. 11/08/22   Patel, Sona, MD  montelukast  (SINGULAIR ) 10 MG tablet Take 1 tablet (10 mg total) by mouth at bedtime. 11/08/22   Patel, Sona, MD  Multiple Vitamin (MULTIVITAMIN WITH MINERALS) TABS tablet Take 1 tablet by mouth daily.    [provider]  Multiple Vitamins-Minerals (PRESERVISION AREDS PO) Take 1 capsule by mouth in the morning and at bedtime.    [provider]  polyethylene glycol (MIRALAX  / GLYCOLAX ) 17 g packet Take 17 g by mouth daily. 11/09/22   Patel, Sona, MD  rosuvastatin  (CRESTOR ) 20 MG tablet TAKE 1 TABLET BY MOUTH EVERY DAY 01/12/23   Kent Pear, MD  sodium chloride  (OCEAN) 0.65 % SOLN nasal spray Place 1 spray  into both nostrils as needed for congestion. 11/08/22   Patel, Sona, MD  tamsulosin  (FLOMAX ) 0.4 MG CAPS capsule Take 1 capsule (0.4 mg total) by mouth daily. 06/27/22   Kathreen Pare, PA-C    Physical Exam: Vitals:   12/19/23 1600 12/19/23 1630 12/19/23 1700 12/19/23 2022  BP: 127/71 117/77 132/68   Pulse: 100 (!) 107 (!) 107   Resp: (!) 22 18 (!) 21   Temp:    (!) 97.3 F (36.3 C)  TempSrc:    Oral  SpO2: 94% 94% 93%   Weight:      Height:       Physical Exam Vitals and nursing note reviewed.  Constitutional:      General: He is not in acute distress.    Appearance: He is not toxic-appearing or diaphoretic.  HENT:     Head: Normocephalic and atraumatic.  Eyes:     Extraocular Movements: Extraocular movements intact.  Cardiovascular:     Rate and Rhythm: Normal rate and regular rhythm.  Pulmonary:     Effort: Pulmonary effort is normal. No tachypnea or respiratory distress.     Breath sounds: No wheezing or rhonchi.  Abdominal:     Palpations: Abdomen is soft.     Comments: Abdominal hernia reducible, non-tender   Musculoskeletal:     Cervical back: Neck supple.     Right lower leg: Edema present.     Left lower leg: Edema present.  Neurological:     Mental Status: He is alert.     Comments: At baseline, delayed cognition     Data Reviewed:   Labs on Admission: I have personally reviewed following labs and imaging studies  CBC: Recent Labs  Lab 12/19/23 1207  WBC 11.3*  HGB 13.0  HCT 40.9  MCV 96.5  PLT 351   Basic Metabolic Panel: Recent Labs  Lab 12/19/23 1207  NA 139  K 3.9  CL 98  CO2 26  GLUCOSE 157*  BUN 41*  CREATININE 1.39*  CALCIUM  9.7   GFR: Estimated Creatinine Clearance: 42.7 mL/min (A) (by C-G formula based on SCr of 1.39 mg/dL (H)). Liver Function Tests: No results for input(s): "AST", "ALT", "ALKPHOS", "BILITOT", "PROT", "ALBUMIN" in the last 168 hours. No results for input(s): "LIPASE", "AMYLASE" in the last 168  hours. No results for input(s): "AMMONIA" in the last 168 hours. Coagulation Profile: No results for input(s): "INR", "PROTIME" in the last 168 hours. Cardiac Enzymes:  No results for input(s): "CKTOTAL", "CKMB", "CKMBINDEX", "TROPONINI" in the last 168 hours. BNP (last 3 results) No results for input(s): "PROBNP" in the last 8760 hours. HbA1C: No results for input(s): "HGBA1C" in the last 72 hours. CBG: No results for input(s): "GLUCAP" in the last 168 hours. Lipid Profile: No results for input(s): "CHOL", "HDL", "LDLCALC", "TRIG", "CHOLHDL", "LDLDIRECT" in the last 72 hours. Thyroid  Function Tests: No results for input(s): "TSH", "T4TOTAL", "FREET4", "T3FREE", "THYROIDAB" in the last 72 hours. Anemia Panel: No results for input(s): "VITAMINB12", "FOLATE", "FERRITIN", "TIBC", "IRON", "RETICCTPCT" in the last 72 hours. Urine analysis:    Component Value Date/Time   COLORURINE YELLOW (A) 12/19/2023 1458   APPEARANCEUR HAZY (A) 12/19/2023 1458   APPEARANCEUR Clear 06/27/2022 1059   LABSPEC 1.019 12/19/2023 1458   PHURINE 5.0 12/19/2023 1458   GLUCOSEU NEGATIVE 12/19/2023 1458   GLUCOSEU NEGATIVE 08/12/2020 1605   HGBUR NEGATIVE 12/19/2023 1458   BILIRUBINUR NEGATIVE 12/19/2023 1458   BILIRUBINUR Negative 06/27/2022 1059   KETONESUR NEGATIVE 12/19/2023 1458   PROTEINUR 100 (A) 12/19/2023 1458   UROBILINOGEN 0.2 11/10/2020 1208   UROBILINOGEN 0.2 08/12/2020 1605   NITRITE NEGATIVE 12/19/2023 1458   LEUKOCYTESUR NEGATIVE 12/19/2023 1458    Radiological Exams on Admission: CT Head Wo Contrast Result Date: 12/19/2023 CLINICAL DATA:  Weakness.  Near syncope. EXAM: CT HEAD WITHOUT CONTRAST TECHNIQUE: Contiguous axial images were obtained from the base of the skull through the vertex without intravenous contrast. RADIATION DOSE REDUCTION: This exam was performed according to the departmental dose-optimization program which includes automated exposure control, adjustment of the mA and/or  kV according to patient size and/or use of iterative reconstruction technique. COMPARISON:  None Available. FINDINGS: Brain: There is no evidence of an acute infarct, acute intracranial hemorrhage, mass, or midline shift. There is a chronic infarct in the left basal ganglia and corona radiata. There is advanced cerebral atrophy with ventricular and sulcal enlargement. Mildly prominent extra-axial CSF over the frontal convexities measures up to 8 mm in thickness on the right and 6 mm on the left and may be secondary to atrophy or reflect small subdural hygromas without significant mass effect. Vascular: No hyperdense vessel. Skull: No acute fracture or suspicious lesion. Sinuses/Orbits: Visualized paranasal sinuses and mastoid air cells are clear. Bilateral cataract extraction. Other: None. IMPRESSION: 1. No evidence of acute intracranial abnormality. 2. Chronic left basal ganglia infarct. 3. Advanced cerebral atrophy. Electronically Signed   By: Aundra Lee M.D.   On: 12/19/2023 16:01   DG Chest Port 1 View Result Date: 12/19/2023 CLINICAL DATA:  Shortness of breath and near syncopal episode chest EXAM: PORTABLE CHEST 1 VIEW COMPARISON:  Radiograph dated 11/02/2022 FINDINGS: Low lung volumes with bronchovascular crowding. Patchy medial right lower and left basilar opacities. No pleural effusion or pneumothorax. The heart size and mediastinal contours are within normal limits. No acute osseous abnormality. IMPRESSION: Low lung volumes with bronchovascular crowding. Patchy medial right lower and left basilar opacities, which may represent atelectasis, aspiration, or pneumonia. Electronically Signed   By: Limin  Xu M.D.   On: 12/19/2023 15:10       Assessment and Plan: No notes have been filed under this hospital service. Service: Hospitalist  89 year old male presents from facility for evaluation of weakness/presyncopal episode when getting off the toilet.  He was wheezing on EMS arrival with low normal  oxygen saturations, found to have b/l LL pneumonia.  Admit to observation  Presyncope/b/l LE weakness  - Initial CT head unremarkable.  Obtain orthostatics.  PT evaluation.  Pneumonia, community-acquired versus aspiration - Respiratory  and blood cultures are pending - Continue steroids, DuoNebs, Rocephin and azithromycin  - N.p.o. pending SLP evaluation  HTN - continue amlodipine .   AKI- bladder scan.   UA no UTI. Holding diuretics (although home medications not confirmed at this time)  - f/u am chemistry   T2DM  - SSI   Lovenox  Monitor/replace electrolytes NPO  pending LSP evaluation   Advance Care Planning:   Code Status: Full Code     Severity of Illness: The appropriate patient status for this patient is OBSERVATION. Observation status is judged to be reasonable and necessary in order to provide the required intensity of service to ensure the patient's safety. The patient's presenting symptoms, physical exam findings, and initial radiographic and laboratory data in the context of their medical condition is felt to place them at decreased risk for further clinical deterioration. Furthermore, it is anticipated that the patient will be medically stable for discharge from the hospital within 2 midnights of admission.   Author: Charlesetta Connors, DO 12/19/2023 9:15 PM  For on call review [email or web address].

## 2023-12-19 NOTE — ED Provider Notes (Signed)
 [facility] Provider Note    Event Date/Time   First MD Initiated Contact with Patient 12/19/23 1147     (approximate)   History   Shortness of Breath and Near Syncope   HPI  Cody Bridges is a 89 y.o. male who presents to the emergency department today with concerns of shortness of breath and weakness.  Patient states that the symptoms started today.  He felt weak in bilateral legs.  Felt like his legs were getting go out from under him if he was to get up and walk.  He has also had some shortness of breath today.  Was given breathing treatment and placed on oxygen by EMS.  Does state that his breathing is a little bit better.  Denies any chest pain. Denies any fevers or chills.      Physical Exam   Triage Vital Signs: ED Triage Vitals  Encounter Vitals Group     BP 12/19/23 1153 (!) 143/68     Systolic BP Percentile --      Diastolic BP Percentile --      Pulse Rate 12/19/23 1153 (!) 103     Resp 12/19/23 1153 19     Temp 12/19/23 1153 98.1 F (36.7 C)     Temp Source 12/19/23 1153 Oral     SpO2 12/19/23 1151 95 %     Weight 12/19/23 1155 211 lb 6.7 oz (95.9 kg)     Height 12/19/23 1155 5\' 10"  (1.778 m)     Head Circumference --      Peak Flow --      Pain Score 12/19/23 1154 0     Pain Loc --      Pain Education --      Exclude from Growth Chart --     Most recent vital signs: Vitals:   12/19/23 1151 12/19/23 1153  BP:  (!) 143/68  Pulse:  (!) 103  Resp:  19  Temp:  98.1 F (36.7 C)  SpO2: 95%    General: Awake, alert, oriented. CV:  Good peripheral perfusion. Regular rate and rhythm. Resp:  Normal effort. Lungs clear. Abd:  No distention.  Other:  PERRL. EOMI. Face symmetric. Strength 5/5 in upper extremities. 5/5 in right lower extremity, 4+/5 in left lower extremity. Sensation grossly intact.    ED Results / Procedures / Treatments   Labs (all labs ordered are listed, but only abnormal results are displayed) Labs  Reviewed  BASIC METABOLIC PANEL WITH GFR - Abnormal; Notable for the following components:      Result Value   Glucose, Bld 157 (*)    BUN 41 (*)    Creatinine, Ser 1.39 (*)    GFR, Estimated 49 (*)    All other components within normal limits  CBC - Abnormal; Notable for the following components:   WBC 11.3 (*)    All other components within normal limits  TROPONIN I (HIGH SENSITIVITY)     EKG  I, Marylynn Soho, attending physician, personally viewed and interpreted this EKG  EKG Time: 1150 Rate: 104 Rhythm: sinus tachycardia with PVC Axis: left axis deviation Intervals: qtc 473 QRS: RBBB ST changes: no st elevation Impression: abnormal ekg    RADIOLOGY I independently interpreted and visualized the CXR. My interpretation: Right sided pneumonia Radiology interpretation:  IMPRESSION:  Low lung volumes with bronchovascular crowding. Patchy medial right  lower and left basilar opacities, which may represent atelectasis,  aspiration, or pneumonia.  PROCEDURES:  Critical Care performed: No    MEDICATIONS ORDERED IN ED: Medications - No data to display   IMPRESSION / MDM / ASSESSMENT AND PLAN / ED COURSE  I reviewed the triage vital signs and the nursing notes.                              Differential diagnosis includes, but is not limited to, COPD, pneumonia, CHF  Patient's presentation is most consistent with acute presentation with potential threat to life or bodily function.   The patient is on the cardiac monitor to evaluate for evidence of arrhythmia and/or significant heart rate changes.  Patient presented to the emergency department today because of concerns for weakness as well as shortness of breath.  On exam patient did have slightly increased work of breathing.  Poor air movement diffusely.  Patient had been given Solu-Medrol  by EMS.  Patient stated he did feel better after EMS treatment.  Will obtain blood work and chest x-ray  here.  Chest x-ray is concerning for possible pneumonia.  Will plan on starting IV antibiotics and admission.      FINAL CLINICAL IMPRESSION(S) / ED DIAGNOSES   Final diagnoses:  Shortness of breath  Pneumonia due to infectious organism, unspecified laterality, unspecified part of lung        Rx / DC Orders     Note:  This document was prepared using Dragon voice recognition software and may include unintentional dictation errors.    Marylynn Soho, MD 12/19/23 [id number]

## 2023-12-20 ENCOUNTER — Observation Stay

## 2023-12-20 ENCOUNTER — Ambulatory Visit

## 2023-12-20 DIAGNOSIS — J189 Pneumonia, unspecified organism: Secondary | ICD-10-CM | POA: Diagnosis not present

## 2023-12-20 LAB — CBC
HCT: 36.7 % — ABNORMAL LOW (ref 39.0–52.0)
Hemoglobin: 12 g/dL — ABNORMAL LOW (ref 13.0–17.0)
MCH: 31.2 pg (ref 26.0–34.0)
MCHC: 32.7 g/dL (ref 30.0–36.0)
MCV: 95.3 fL (ref 80.0–100.0)
Platelets: 328 10*3/uL (ref 150–400)
RBC: 3.85 MIL/uL — ABNORMAL LOW (ref 4.22–5.81)
RDW: 13.2 % (ref 11.5–15.5)
WBC: 15.1 10*3/uL — ABNORMAL HIGH (ref 4.0–10.5)
nRBC: 0 % (ref 0.0–0.2)

## 2023-12-20 LAB — GLUCOSE, CAPILLARY
Glucose-Capillary: 225 mg/dL — ABNORMAL HIGH (ref 70–99)
Glucose-Capillary: 190 mg/dL — ABNORMAL HIGH (ref 70–99)
Glucose-Capillary: 214 mg/dL — ABNORMAL HIGH (ref 70–99)
Glucose-Capillary: 134 mg/dL — ABNORMAL HIGH (ref 70–99)
Glucose-Capillary: 258 mg/dL — ABNORMAL HIGH (ref 70–99)

## 2023-12-20 LAB — RESPIRATORY PANEL BY PCR

## 2023-12-20 LAB — BASIC METABOLIC PANEL WITH GFR
Anion gap: 11 (ref 5–15)
BUN: 35 mg/dL — ABNORMAL HIGH (ref 8–23)
CO2: 24 mmol/L (ref 22–32)
Calcium: 9 mg/dL (ref 8.9–10.3)
Chloride: 103 mmol/L (ref 98–111)
Creatinine, Ser: 1.03 mg/dL (ref 0.61–1.24)
GFR, Estimated: >60 mL/min (ref >60)
Glucose, Bld: 185 mg/dL — ABNORMAL HIGH (ref 70–99)
Potassium: 3.8 mmol/L (ref 3.5–5.1)
Sodium: 138 mmol/L (ref 135–145)

## 2023-12-20 LAB — HEMOGLOBIN A1C
Hgb A1c MFr Bld: 6.5 % — ABNORMAL HIGH (ref 4.8–5.6)
Mean Plasma Glucose: 139.85 mg/dL

## 2023-12-20 LAB — SARS CORONAVIRUS 2 BY RT PCR: SARS Coronavirus 2 by RT PCR: NEGATIVE

## 2023-12-20 LAB — PROCALCITONIN: Procalcitonin: <0.1 ng/mL

## 2023-12-20 LAB — TSH: TSH: 1.702 u[IU]/mL (ref 0.350–4.500)

## 2023-12-20 LAB — CK: Total CK: 70 U/L (ref 49–397)

## 2023-12-20 LAB — VITAMIN B12: Vitamin B-12: 513 pg/mL (ref 180–914)

## 2023-12-20 MED ORDER — IPRATROPIUM-ALBUTEROL 0.5-2.5 (3) MG/3ML IN SOLN
3.0000 mL | Freq: Two times a day (BID) | RESPIRATORY_TRACT | Status: DC
  Administered 2023-12-20 – 2023-12-21 (×2): 3 mL via RESPIRATORY_TRACT
  Filled 2023-12-20 (×2): qty 3

## 2023-12-20 NOTE — Plan of Care (Signed)
  Problem: Coping: Goal: Ability to adjust to condition or change in health will improve Outcome: Progressing   Problem: Clinical Measurements: Goal: Respiratory complications will improve Outcome: Progressing Goal: Cardiovascular complication will be avoided Outcome: Progressing   Problem: Coping: Goal: Level of anxiety will decrease Outcome: Progressing

## 2023-12-20 NOTE — Progress Notes (Signed)
 PROGRESS NOTE    Langston Tuberville Guzzo  BJY:[id number] DOB: April 04, 1933 DOA: 12/19/2023 PCP: Nadine Aus, NP     Brief Narrative:   From admission h and p  Jolayne Natter Ballman is a 89 y.o. male presenting to the emergency department today following a near syncopal episode getting off the toilet at facility.  Patient reports his knees buckled and gave out when he tried to get up.  He reports associated dizziness and shortness of breath that started around the same time. Denies LOC or fall.  On EMS arrival, patient was apparently wheezing and was placed on 2 L of nasal cannula for saturations noted in the low 90%'s.  He received Solu-Medrol  en route.  By the time my exam, patient had been weaned back to room air and reported resolution of his respiratory symptoms. He was afebrile with white count of 11, hemodynamically stable.  Chemistry fairly unremarkable except for a slight increase in his creatinine to 1.4 from baseline about 1.1.  He reports the urge to void but states he is unable to.   Chest x-ray with low lung volumes and patchy medial right lower and left basilar opacities. CT head without acute intracranial abnormality was significant for advanced cerebral atrophy. Admission was requested for further management.  Assessment & Plan:   Principal Problem:   Pneumonia Active Problems:   Hypertension   Chronic diastolic CHF (congestive heart failure) (HCC)   Diabetes mellitus without complication (HCC)   COPD (chronic obstructive pulmonary disease) (HCC)  # Lower extremity weakness Most likely progressive debility. Pneumonia possibly contributing. Evidence prior stroke on CT which appears to be new diagnosis - PT/OT evals - will check mri brain - no back pain so think cord process less likely - check ck  # Cough, shortness of breath # COPD Unclear to what extent this is acute vs chronic. No o2 requirement. Basilar opacities could be infection - continue ceftriaxone/azithromycin   for now - f/u respiratory panel, procal - consider ct chest to further characterize - home trelegy on hold - duonebs standing, oral prednisone   # T2DM Glucose appropriate, A1c 6.5% - SSI  # HTN Bp appropriate - cont home amlodipine  - home spiro and hydrochlorothiazide  on hold  # AKI Mild, 1.39 on presentation, resolved to 1.03 today - home diuretics on hold  # BPH Able to urinate - cont home finasteride , flomax   # Cognitive impairment Appears to have at least MCI, possible dementia. Advanced atrophy seen on CT of brain - f/u b12, tsh  # Obesity noted   DVT prophylaxis: lovenox  Code Status: full Family Communication: no answer when daughter telephoned  Level of care: Med-Surg Status is: Observation    Consultants:  none  Procedures: none  Antimicrobials:  See above    Subjective: Reports occasional cough  Objective: Vitals:   12/19/23 2203 12/20/23 0328 12/20/23 0733 12/20/23 0734  BP: (!) 145/74 128/69  (!) 146/75  Pulse: 99 78  81  Resp: 17 18  16   Temp: 98.1 F (36.7 C) 97.8 F (36.6 C)  97.6 F (36.4 C)  TempSrc: Oral   Oral  SpO2: 97% 95% 99% 98%  Weight:      Height:        Intake/Output Summary (Last 24 hours) at 12/20/2023 0923 Last data filed at 12/20/2023 0600 Gross per 24 hour  Intake 337.54 ml  Output 500 ml  Net -162.46 ml   Filed Weights   12/19/23 1155  Weight: 95.9 kg    Examination:  General exam: Appears calm and comfortable  Respiratory system: faint exp wheeze Cardiovascular system: S1 & S2 heard, RRR.   Gastrointestinal system: Abdomen is obese, soft and nontender.   Central nervous system: Alert and oriented to self, place Extremities: Symmetric 5 x 5 power. Skin: No rashes, lesions or ulcers Psychiatry: Judgement and insight appear normal. Mood & affect appropriate.     Data Reviewed: I have personally reviewed following labs and imaging studies  CBC: Recent Labs  Lab 12/19/23 1207 12/20/23 0340   WBC 11.3* 15.1*  HGB 13.0 12.0*  HCT 40.9 36.7*  MCV 96.5 95.3  PLT 351 328   Basic Metabolic Panel: Recent Labs  Lab 12/19/23 1207 12/20/23 0340  NA 139 138  K 3.9 3.8  CL 98 103  CO2 26 24  GLUCOSE 157* 185*  BUN 41* 35*  CREATININE 1.39* 1.03  CALCIUM  9.7 9.0   GFR: Estimated Creatinine Clearance: 57.6 mL/min (by C-G formula based on SCr of 1.03 mg/dL). Liver Function Tests: No results for input(s): "AST", "ALT", "ALKPHOS", "BILITOT", "PROT", "ALBUMIN" in the last 168 hours. No results for input(s): "LIPASE", "AMYLASE" in the last 168 hours. No results for input(s): "AMMONIA" in the last 168 hours. Coagulation Profile: No results for input(s): "INR", "PROTIME" in the last 168 hours. Cardiac Enzymes: No results for input(s): "CKTOTAL", "CKMB", "CKMBINDEX", "TROPONINI" in the last 168 hours. BNP (last 3 results) No results for input(s): "PROBNP" in the last 8760 hours. HbA1C: Recent Labs    12/19/23 1207  HGBA1C 6.5*   CBG: Recent Labs  Lab 12/19/23 2205 12/19/23 2328 12/20/23 0736  GLUCAP 225* 179* 190*   Lipid Profile: No results for input(s): "CHOL", "HDL", "LDLCALC", "TRIG", "CHOLHDL", "LDLDIRECT" in the last 72 hours. Thyroid  Function Tests: No results for input(s): "TSH", "T4TOTAL", "FREET4", "T3FREE", "THYROIDAB" in the last 72 hours. Anemia Panel: No results for input(s): "VITAMINB12", "FOLATE", "FERRITIN", "TIBC", "IRON", "RETICCTPCT" in the last 72 hours. Urine analysis:    Component Value Date/Time   COLORURINE YELLOW (A) 12/19/2023 1458   APPEARANCEUR HAZY (A) 12/19/2023 1458   APPEARANCEUR Clear 06/27/2022 1059   LABSPEC 1.019 12/19/2023 1458   PHURINE 5.0 12/19/2023 1458   GLUCOSEU NEGATIVE 12/19/2023 1458   GLUCOSEU NEGATIVE 08/12/2020 1605   HGBUR NEGATIVE 12/19/2023 1458   BILIRUBINUR NEGATIVE 12/19/2023 1458   BILIRUBINUR Negative 06/27/2022 1059   KETONESUR NEGATIVE 12/19/2023 1458   PROTEINUR 100 (A) 12/19/2023 1458    UROBILINOGEN 0.2 11/10/2020 1208   UROBILINOGEN 0.2 08/12/2020 1605   NITRITE NEGATIVE 12/19/2023 1458   LEUKOCYTESUR NEGATIVE 12/19/2023 1458   Sepsis Labs: @LABRCNTIP (procalcitonin:4,lacticidven:4)  ) Recent Results (from the past 240 hours)  Blood culture (routine x 2)     Status: None (Preliminary result)   Collection Time: 12/19/23  3:51 PM   Specimen: BLOOD  Result Value Ref Range Status   Specimen Description BLOOD BLOOD RIGHT FOREARM  Final   Special Requests   Final    BOTTLES DRAWN AEROBIC AND ANAEROBIC Blood Culture adequate volume   Culture   Final    NO GROWTH < 24 HOURS Performed at [facility], [address] [place], [place] [address]    Report Status PENDING  Incomplete  Blood culture (routine x 2)     Status: None (Preliminary result)   Collection Time: 12/19/23  3:51 PM   Specimen: BLOOD  Result Value Ref Range Status   Specimen Description BLOOD RIGHT ANTECUBITAL  Final   Special Requests   Final  BOTTLES DRAWN AEROBIC AND ANAEROBIC Blood Culture adequate volume   Culture  Setup Time PENDING  Incomplete   Culture   Final    NO GROWTH < 12 HOURS Performed at [facility], [address], [place], [place] [address]    Report Status PENDING  Incomplete         Radiology Studies: CT Head Wo Contrast Result Date: 12/19/2023 CLINICAL DATA:  Weakness.  Near syncope. EXAM: CT HEAD WITHOUT CONTRAST TECHNIQUE: Contiguous axial images were obtained from the base of the skull through the vertex without intravenous contrast. RADIATION DOSE REDUCTION: This exam was performed according to the departmental dose-optimization program which includes automated exposure control, adjustment of the mA and/or kV according to patient size and/or use of iterative reconstruction technique. COMPARISON:  None Available. FINDINGS: Brain: There is no evidence of an acute infarct, acute intracranial hemorrhage, mass, or midline shift. There is a chronic infarct  in the left basal ganglia and corona radiata. There is advanced cerebral atrophy with ventricular and sulcal enlargement. Mildly prominent extra-axial CSF over the frontal convexities measures up to 8 mm in thickness on the right and 6 mm on the left and may be secondary to atrophy or reflect small subdural hygromas without significant mass effect. Vascular: No hyperdense vessel. Skull: No acute fracture or suspicious lesion. Sinuses/Orbits: Visualized paranasal sinuses and mastoid air cells are clear. Bilateral cataract extraction. Other: None. IMPRESSION: 1. No evidence of acute intracranial abnormality. 2. Chronic left basal ganglia infarct. 3. Advanced cerebral atrophy. Electronically Signed   By: Aundra Lee M.D.   On: 12/19/2023 16:01   DG Chest Port 1 View Result Date: 12/19/2023 CLINICAL DATA:  Shortness of breath and near syncopal episode chest EXAM: PORTABLE CHEST 1 VIEW COMPARISON:  Radiograph dated 11/02/2022 FINDINGS: Low lung volumes with bronchovascular crowding. Patchy medial right lower and left basilar opacities. No pleural effusion or pneumothorax. The heart size and mediastinal contours are within normal limits. No acute osseous abnormality. IMPRESSION: Low lung volumes with bronchovascular crowding. Patchy medial right lower and left basilar opacities, which may represent atelectasis, aspiration, or pneumonia. Electronically Signed   By: Limin  Xu M.D.   On: 12/19/2023 15:10        Scheduled Meds:  amLODipine   5 mg Oral Daily   budesonide (PULMICORT) nebulizer solution  0.25 mg Nebulization BID   enoxaparin  (LOVENOX ) injection  50 mg Subcutaneous Q24H   feeding supplement  237 mL Oral BID BM   finasteride   5 mg Oral Daily   insulin  aspart  0-5 Units Subcutaneous QHS   insulin  aspart  0-9 Units Subcutaneous TID WC   ipratropium-albuterol   3 mL Nebulization BID   latanoprost   1 drop Left Eye QHS   methylPREDNISolone  (SOLU-MEDROL ) injection  40 mg Intravenous Daily    montelukast   10 mg Oral QHS   polyethylene glycol  17 g Oral Daily   rosuvastatin   20 mg Oral Daily   tamsulosin   0.4 mg Oral Daily   Continuous Infusions:  azithromycin      cefTRIAXone (ROCEPHIN)  IV Stopped (12/19/23 2138)     LOS: 0 days     Raymonde Calico, MD [facility]   If 7PM-7AM, please contact night-coverage [email or web address] Password [email or web address] 12/20/2023, 9:23 AM

## 2023-12-20 NOTE — Plan of Care (Signed)

## 2023-12-20 NOTE — Care Management Obs Status (Signed)
 MEDICARE OBSERVATION STATUS NOTIFICATION   Patient Details  Name: Cody Bridges MRN: [id number] Date of Birth: July 27, 1933   Medicare Observation Status Notification Given:  Yes    Sherrilynn Gudgel W, CMA 12/20/2023, 2:26 PM

## 2023-12-20 NOTE — Evaluation (Signed)
 Physical Therapy Evaluation Patient Details Name: Cody Bridges MRN: [id number] DOB: [birth year] Today's Date: 12/20/2023  History of Present Illness  89 y.o. male presenting to the emergency department today following a near syncopal episode getting off the toilet at facility.  Patient reports his knees buckled and gave out when he tried to get up.  He reports associated dizziness and shortness of breath that started around the same time.  Clinical Impression  Pt reports he is feeling okay but still quite weak.  He showed good effort with bed mobility tasks but needed assist with LEs and trunk.  However his most noted issue was weakness in b/l LEs, especially quads as he needed modA from an elevated bed to attain standing and them assumed the knee locked-out, heavily walker reliant posture to maintain standing to avoid buckling.  He managed a small loop of in room ambulation with this same strategy but is far from his baseline.  Pt will benefit from continued PT to address functional limitations.        If plan is discharge home, recommend the following: A little help with walking and/or transfers;A little help with bathing/dressing/bathroom;Assistance with cooking/housework;Assist for transportation   Can travel by private vehicle   No    Equipment Recommendations None recommended by PT  Recommendations for Other Services       Functional Status Assessment Patient has had a recent decline in their functional status and demonstrates the ability to make significant improvements in function in a reasonable and predictable amount of time.     Precautions / Restrictions Precautions Precautions: Fall Restrictions Weight Bearing Restrictions Per Provider Order: No      Mobility  Bed Mobility Overal bed mobility: Needs Assistance Bed Mobility: Supine to Sit, Sit to Supine     Supine to sit: Min assist Sit to supine: Min assist   General bed mobility comments: Pt showed good effort  and was able to initiate movements but ultimately needed physical assist to complete bed mobility to/from sitting    Transfers Overall transfer level: Needs assistance Equipment used: Rolling walker (2 wheels) Transfers: Sit to/from Stand Sit to Stand: Min assist, From elevated surface, Mod assist           General transfer comment: Pt showed good effort but struggled to rise from standard height bed than then raised 2".  Ultimately needed min/modA and heavy cuing/UE use from ~4" elevated surface    Ambulation/Gait   Gait Distance (Feet): 15 Feet Assistive device: Rolling walker (2 wheels)         General Gait Details: Pt initially very hesitant to try walking 2/2 lack of confidence with quads/knees.  He tended to remain in TKE position to avoid buckling and heavily reliant on the walker posturing.  Stairs            Wheelchair Mobility     Tilt Bed    Modified Rankin (Stroke Patients Only)       Balance Overall balance assessment: Needs assistance Sitting-balance support: Bilateral upper extremity supported Sitting balance-Leahy Scale: Good     Standing balance support: Bilateral upper extremity supported Standing balance-Leahy Scale: Poor Standing balance comment: Pt was able to maintain balance but required heavy UEs and b/l knee locked out position                             Pertinent Vitals/Pain Pain Assessment Pain Assessment: No/denies pain    Home  Living Family/patient expects to be discharged to:: Assisted living                 Home Equipment: Agricultural consultant (2 wheels) Additional Comments: resident at [facility]    Prior Function Prior Level of Function : Independent/Modified Independent             Mobility Comments: reports being able to ambualte around [place] ad lib until recently ADLs Comments: staff help him with showering, some help with dressing, meals     Extremity/Trunk Assessment   Upper Extremity  Assessment Upper Extremity Assessment: Generalized weakness    Lower Extremity Assessment Lower Extremity Assessment: Generalized weakness (L ankle DF limited to neutral - L LE grossly 3+/5, R grossly 4-5)       Communication   Communication Communication: No apparent difficulties    Cognition Arousal: Alert Behavior During Therapy: WFL for tasks assessed/performed   PT - Cognitive impairments: No apparent impairments                         Following commands: Intact       Cueing Cueing Techniques: Verbal cues     General Comments General comments (skin integrity, edema, etc.): Pt eager to participate but hesitant with standing/WBing tasks siting feeling his knees are "so weak"    Exercises     Assessment/Plan    PT Assessment Patient needs continued PT services  PT Problem List Decreased range of motion;Decreased strength;Decreased activity tolerance;Decreased balance;Decreased mobility;Decreased safety awareness       PT Treatment Interventions DME instruction;Gait training;Functional mobility training;Therapeutic activities;Therapeutic exercise;Balance training;Patient/family education    PT Goals (Current goals can be found in the Care Plan section)  Acute Rehab PT Goals Patient Stated Goal: get stronger PT Goal Formulation: With patient Time For Goal Achievement: 01/02/24 Potential to Achieve Goals: Good    Frequency Min 2X/week     Co-evaluation               AM-PAC PT "6 Clicks" Mobility  Outcome Measure Help needed turning from your back to your side while in a flat bed without using bedrails?: A Little Help needed moving from lying on your back to sitting on the side of a flat bed without using bedrails?: A Little Help needed moving to and from a bed to a chair (including a wheelchair)?: A Lot Help needed standing up from a chair using your arms (e.g., wheelchair or bedside chair)?: A Lot Help needed to walk in hospital room?: A  Lot Help needed climbing 3-5 steps with a railing? : Total 6 Click Score: 13    End of Session Equipment Utilized During Treatment: Gait belt Activity Tolerance: Patient limited by fatigue Patient left: in bed;with call bell/phone within reach;with nursing/sitter in room Nurse Communication: Mobility status PT Visit Diagnosis: Muscle weakness (generalized) (M62.81);Unsteadiness on feet (R26.81);Difficulty in walking, not elsewhere classified (R26.2)    Time: 7829-5621 PT Time Calculation (min) (ACUTE ONLY): 24 min   Charges:   PT Evaluation $PT Eval Low Complexity: 1 Low PT Treatments $Therapeutic Activity: 8-22 mins PT General Charges $$ ACUTE PT VISIT: 1 Visit         Darice Edelman, DPT 12/20/2023, 1:01 PM

## 2023-12-21 ENCOUNTER — Inpatient Hospital Stay

## 2023-12-21 DIAGNOSIS — F0390 Unspecified dementia without behavioral disturbance: Secondary | ICD-10-CM | POA: Diagnosis present

## 2023-12-21 DIAGNOSIS — E669 Obesity, unspecified: Secondary | ICD-10-CM | POA: Diagnosis present

## 2023-12-21 DIAGNOSIS — K219 Gastro-esophageal reflux disease without esophagitis: Secondary | ICD-10-CM | POA: Diagnosis present

## 2023-12-21 DIAGNOSIS — M48062 Spinal stenosis, lumbar region with neurogenic claudication: Secondary | ICD-10-CM | POA: Diagnosis present

## 2023-12-21 DIAGNOSIS — Z87891 Personal history of nicotine dependence: Secondary | ICD-10-CM | POA: Diagnosis not present

## 2023-12-21 DIAGNOSIS — J189 Pneumonia, unspecified organism: Secondary | ICD-10-CM | POA: Diagnosis present

## 2023-12-21 DIAGNOSIS — Z9841 Cataract extraction status, right eye: Secondary | ICD-10-CM | POA: Diagnosis not present

## 2023-12-21 DIAGNOSIS — Z881 Allergy status to other antibiotic agents status: Secondary | ICD-10-CM | POA: Diagnosis not present

## 2023-12-21 DIAGNOSIS — Z880 Allergy status to penicillin: Secondary | ICD-10-CM | POA: Diagnosis not present

## 2023-12-21 DIAGNOSIS — Z8673 Personal history of transient ischemic attack (TIA), and cerebral infarction without residual deficits: Secondary | ICD-10-CM | POA: Diagnosis not present

## 2023-12-21 DIAGNOSIS — E119 Type 2 diabetes mellitus without complications: Secondary | ICD-10-CM | POA: Diagnosis present

## 2023-12-21 DIAGNOSIS — R0602 Shortness of breath: Secondary | ICD-10-CM | POA: Diagnosis present

## 2023-12-21 DIAGNOSIS — R4189 Other symptoms and signs involving cognitive functions and awareness: Secondary | ICD-10-CM | POA: Diagnosis present

## 2023-12-21 DIAGNOSIS — I110 Hypertensive heart disease with heart failure: Secondary | ICD-10-CM | POA: Diagnosis present

## 2023-12-21 DIAGNOSIS — R5381 Other malaise: Secondary | ICD-10-CM | POA: Diagnosis present

## 2023-12-21 DIAGNOSIS — Z9842 Cataract extraction status, left eye: Secondary | ICD-10-CM | POA: Diagnosis not present

## 2023-12-21 DIAGNOSIS — Z888 Allergy status to other drugs, medicaments and biological substances status: Secondary | ICD-10-CM | POA: Diagnosis not present

## 2023-12-21 DIAGNOSIS — I5032 Chronic diastolic (congestive) heart failure: Secondary | ICD-10-CM | POA: Diagnosis present

## 2023-12-21 DIAGNOSIS — Z7984 Long term (current) use of oral hypoglycemic drugs: Secondary | ICD-10-CM | POA: Diagnosis not present

## 2023-12-21 DIAGNOSIS — R29898 Other symptoms and signs involving the musculoskeletal system: Secondary | ICD-10-CM | POA: Diagnosis not present

## 2023-12-21 DIAGNOSIS — Z961 Presence of intraocular lens: Secondary | ICD-10-CM | POA: Diagnosis present

## 2023-12-21 DIAGNOSIS — N179 Acute kidney failure, unspecified: Secondary | ICD-10-CM | POA: Diagnosis present

## 2023-12-21 DIAGNOSIS — J449 Chronic obstructive pulmonary disease, unspecified: Secondary | ICD-10-CM | POA: Diagnosis present

## 2023-12-21 DIAGNOSIS — E785 Hyperlipidemia, unspecified: Secondary | ICD-10-CM | POA: Diagnosis present

## 2023-12-21 DIAGNOSIS — Z8616 Personal history of COVID-19: Secondary | ICD-10-CM | POA: Diagnosis not present

## 2023-12-21 DIAGNOSIS — N400 Enlarged prostate without lower urinary tract symptoms: Secondary | ICD-10-CM | POA: Diagnosis present

## 2023-12-21 DIAGNOSIS — Z79899 Other long term (current) drug therapy: Secondary | ICD-10-CM | POA: Diagnosis not present

## 2023-12-21 LAB — GLUCOSE, CAPILLARY
Glucose-Capillary: 128 mg/dL — ABNORMAL HIGH (ref 70–99)
Glucose-Capillary: 228 mg/dL — ABNORMAL HIGH (ref 70–99)
Glucose-Capillary: 259 mg/dL — ABNORMAL HIGH (ref 70–99)
Glucose-Capillary: 211 mg/dL — ABNORMAL HIGH (ref 70–99)

## 2023-12-21 LAB — CBC
HCT: 35.6 % — ABNORMAL LOW (ref 39.0–52.0)
Hemoglobin: 11.5 g/dL — ABNORMAL LOW (ref 13.0–17.0)
MCH: 31 pg (ref 26.0–34.0)
MCHC: 32.3 g/dL (ref 30.0–36.0)
MCV: 96 fL (ref 80.0–100.0)
Platelets: 325 10*3/uL (ref 150–400)
RBC: 3.71 MIL/uL — ABNORMAL LOW (ref 4.22–5.81)
RDW: 13.8 % (ref 11.5–15.5)
WBC: 17.7 10*3/uL — ABNORMAL HIGH (ref 4.0–10.5)
nRBC: 0 % (ref 0.0–0.2)

## 2023-12-21 LAB — BASIC METABOLIC PANEL WITH GFR
Anion gap: 8 (ref 5–15)
BUN: 43 mg/dL — ABNORMAL HIGH (ref 8–23)
CO2: 27 mmol/L (ref 22–32)
Calcium: 8.9 mg/dL (ref 8.9–10.3)
Chloride: 103 mmol/L (ref 98–111)
Creatinine, Ser: 1.02 mg/dL (ref 0.61–1.24)
GFR, Estimated: >60 mL/min (ref >60)
Glucose, Bld: 138 mg/dL — ABNORMAL HIGH (ref 70–99)
Potassium: 4.4 mmol/L (ref 3.5–5.1)
Sodium: 138 mmol/L (ref 135–145)

## 2023-12-21 MED ORDER — BUDESON-GLYCOPYRROL-FORMOTEROL 160-9-4.8 MCG/ACT IN AERO
2.0000 | INHALATION_SPRAY | Freq: Two times a day (BID) | RESPIRATORY_TRACT | Status: DC
  Administered 2023-12-21 – 2023-12-25 (×9): 2 via RESPIRATORY_TRACT
  Filled 2023-12-21: qty 5.9

## 2023-12-21 MED ORDER — FUROSEMIDE 20 MG PO TABS
20.0000 mg | ORAL_TABLET | Freq: Every day | ORAL | Status: DC
  Administered 2023-12-21 – 2023-12-25 (×5): 20 mg via ORAL
  Filled 2023-12-21 (×5): qty 1

## 2023-12-21 MED ORDER — ASPIRIN 81 MG PO TBEC
81.0000 mg | DELAYED_RELEASE_TABLET | Freq: Every day | ORAL | Status: DC
  Administered 2023-12-22 – 2023-12-25 (×4): 81 mg via ORAL
  Filled 2023-12-21 (×4): qty 1

## 2023-12-21 NOTE — Plan of Care (Signed)

## 2023-12-21 NOTE — NC FL2 (Signed)
 [place]  MEDICAID FL2 LEVEL OF CARE FORM     IDENTIFICATION  Patient Name: Cody Bridges Birthdate: 1933-02-23 Sex: male Admission Date (Current Location): 12/19/2023  [facility] and [place] Number:  Chiropodist and Address:  [facility], [address], [place], [place] [address]      Provider Number:    Attending Physician Name and Address:  Janeane Mealy, MD  Relative Name and Phone Number:  [name] Sterbenz    Current Level of Care: Hospital Recommended Level of Care: Skilled Nursing Facility Prior Approval Number:    Date Approved/Denied: 11/04/22 PASRR Number: [id number]  Discharge Plan: SNF    Current Diagnoses: Patient Active Problem List   Diagnosis Date Noted   Pneumonia 12/21/2023   COPD exacerbation (HCC) 11/02/2022   Acute respiratory failure with hypoxia (HCC) 11/02/2022   HLD (hyperlipidemia) 11/02/2022   Chronic diastolic CHF (congestive heart failure) (HCC) 11/02/2022   Asthma exacerbation 11/02/2022   BPH (benign prostatic hyperplasia) 06/13/2022   Loose stools 06/13/2022   DOE (dyspnea on exertion) 06/13/2022   Palpitations 06/13/2022   Allergic rhinitis 09/10/2021   Foot callus 05/31/2021   History of migraine 11/10/2020   Cardiac syncope 10/22/2019   Intramuscular lipoma 10/12/2018   Bilateral carotid artery stenosis 10/05/2018   Atherosclerotic peripheral vascular disease with intermittent claudication (HCC) 09/04/2018   Premature atrial complex 07/04/2018   Urine frequency 03/06/2018   Benign prostatic hyperplasia with urinary frequency 03/06/2018   Lower extremity weakness 05/01/2017   Orthostasis 05/01/2017   Lung nodule    Bilateral leg edema 01/26/2017   Rash and nonspecific skin eruption 10/25/2016   Diastasis recti 10/24/2016   Non-recurrent unilateral inguinal hernia without obstruction or gangrene 10/24/2016   Hypertension 10/24/2016   Diabetes mellitus without complication (HCC)  09/20/2016   COPD (chronic obstructive pulmonary disease) (HCC) 09/20/2016   Colon polyps 08/29/2014   Hyperlipemia, mixed 02/05/2014    Orientation RESPIRATION BLADDER Height & Weight     Self, Time, Situation, Place  Normal Continent Weight: 95.9 kg Height:  5\' 10"  (177.8 cm)  BEHAVIORAL SYMPTOMS/MOOD NEUROLOGICAL BOWEL NUTRITION STATUS      Continent Diet (Please see discharge summary)  AMBULATORY STATUS COMMUNICATION OF NEEDS Skin   Limited Assist Verbally  (Dry, non-tenting)                       Personal Care Assistance Level of Assistance  Bathing, Feeding, Dressing, Total care Bathing Assistance: Limited assistance Feeding assistance: Limited assistance Dressing Assistance: Limited assistance Total Care Assistance: Limited assistance   Functional Limitations Info  Sight, Hearing Sight Info: Impaired Hearing Info: Impaired      SPECIAL CARE FACTORS FREQUENCY  PT (By licensed PT), OT (By licensed OT)     PT Frequency: 5 xper week OT Frequency: 5 x per week            Contractures      Additional Factors Info  Code Status, Allergies Code Status Info: Full Allergies Info: Clindamycin  No severity specified - Hives  Lisinopril  No severity specified - Cough  Penicillin G    Received from outside source  No severity or reactions specified  Penicillin V Potassium  Low - Other (See Comments), Rash           Current Medications (12/21/2023):  This is the current hospital active medication list Current Facility-Administered Medications  Medication Dose Route Frequency Provider Last Rate Last Admin   acetaminophen  (TYLENOL )  tablet 1,000 mg  1,000 mg Oral Q6H PRN Krugh, Marissa C, DO       amLODipine  (NORVASC ) tablet 5 mg  5 mg Oral Daily Krugh, Marissa C, DO   5 mg at 12/21/23 1015   [START ON 12/22/2023] aspirin EC tablet 81 mg  81 mg Oral Daily Wouk, Haynes Lips, MD       budesonide-glycopyrrolate-formoterol  (BREZTRI) 160-9-4.8 MCG/ACT inhaler 2 puff  2  puff Inhalation BID Janeane Mealy, MD   2 puff at 12/21/23 1456   dextromethorphan -guaiFENesin  (MUCINEX  DM) 30-600 MG per 12 hr tablet 1 tablet  1 tablet Oral BID PRN Krugh, Marissa C, DO       enoxaparin  (LOVENOX ) injection 50 mg  50 mg Subcutaneous Q24H Krugh, Marissa C, DO   50 mg at 12/20/23 1853   feeding supplement (ENSURE ENLIVE / ENSURE PLUS) liquid 237 mL  237 mL Oral BID BM Krugh, Marissa C, DO   237 mL at 12/21/23 1453   finasteride  (PROSCAR ) tablet 5 mg  5 mg Oral Daily Krugh, Marissa C, DO   5 mg at 12/21/23 1015   furosemide (LASIX) tablet 20 mg  20 mg Oral Daily Janeane Mealy, MD   20 mg at 12/21/23 1453   insulin  aspart (novoLOG ) injection 0-5 Units  0-5 Units Subcutaneous QHS Krugh, Marissa C, DO   3 Units at 12/20/23 2112   insulin  aspart (novoLOG ) injection 0-9 Units  0-9 Units Subcutaneous TID WC Krugh, Marissa C, DO   1 Units at 12/21/23 1016   latanoprost  (XALATAN ) 0.005 % ophthalmic solution 1 drop  1 drop Left Eye QHS Krugh, Marissa C, DO   1 drop at 12/20/23 2116   montelukast  (SINGULAIR ) tablet 10 mg  10 mg Oral QHS Krugh, Marissa C, DO   10 mg at 12/20/23 2113   ondansetron  (ZOFRAN ) tablet 4 mg  4 mg Oral Q6H PRN Krugh, Marissa C, DO       Or   ondansetron  (ZOFRAN ) injection 4 mg  4 mg Intravenous Q6H PRN Krugh, Marissa C, DO       polyethylene glycol (MIRALAX  / GLYCOLAX ) packet 17 g  17 g Oral Daily Krugh, Marissa C, DO   17 g at 12/21/23 1017   polyvinyl alcohol  (LIQUIFILM TEARS) 1.4 % ophthalmic solution 1 drop  1 drop Both Eyes TID PRN Ananias Balls, RPH       rosuvastatin  (CRESTOR ) tablet 20 mg  20 mg Oral Daily Krugh, Marissa C, DO   20 mg at 12/21/23 1015   sodium chloride  (OCEAN) 0.65 % nasal spray 1 spray  1 spray Each Nare PRN Krugh, Marissa C, DO       tamsulosin  (FLOMAX ) capsule 0.4 mg  0.4 mg Oral Daily Krugh, Marissa C, DO   0.4 mg at 12/21/23 1015     Discharge Medications: Please see discharge summary for a list of discharge  medications.  Relevant Imaging Results:  Relevant Lab Results:   Additional Information SSN: [id number]  Crayton Docker, RN

## 2023-12-21 NOTE — Progress Notes (Signed)
 PROGRESS NOTE    Cody Bridges  ZOX:[id number] DOB: 03-18-1933 DOA: 12/19/2023 PCP: Nadine Aus, NP     Brief Narrative:   From admission h and p  Cody Bridges is a 89 y.o. male presenting to the emergency department today following a near syncopal episode getting off the toilet at facility.  Patient reports his knees buckled and gave out when he tried to get up.  He reports associated dizziness and shortness of breath that started around the same time. Denies LOC or fall.  On EMS arrival, patient was apparently wheezing and was placed on 2 L of nasal cannula for saturations noted in the low 90%'s.  He received Solu-Medrol  en route.  By the time my exam, patient had been weaned back to room air and reported resolution of his respiratory symptoms. He was afebrile with white count of 11, hemodynamically stable.  Chemistry fairly unremarkable except for a slight increase in his creatinine to 1.4 from baseline about 1.1.  He reports the urge to void but states he is unable to.   Chest x-ray with low lung volumes and patchy medial right lower and left basilar opacities. CT head without acute intracranial abnormality was significant for advanced cerebral atrophy. Admission was requested for further management.  Assessment & Plan:   Principal Problem:   Lower extremity weakness Active Problems:   Hypertension   Chronic diastolic CHF (congestive heart failure) (HCC)   Diabetes mellitus without complication (HCC)   COPD (chronic obstructive pulmonary disease) (HCC)  # Lower extremity weakness Most likely progressive debility. Evidence prior stroke on mri, nothing acute. Ck wnl - PT advising snf, toc working [employer] - will check mri of lumbar spine to r/o cord process  # Cough, shortness of breath # COPD Unclear to what extent this is acute vs chronic. No o2 requirement. Basilar opacities could be infection. Procal is normal. Breathing comfortably today. Respiratory panel neg -  d/c ceftriaxone/azithromycin    - d/c steroids - bretzri for trelegy on hold  # Hx CVA On mri - start asa - f/u lipid panel  # T2DM Glucose appropriate, A1c 6.5% - SSI  # HTN Bp appropriate - cont home amlodipine  - home spiro and hydrochlorothiazide  on hold  # AKI Resolved - resume home lasix and monitor  # BPH Able to urinate - cont home finasteride , flomax   # Cognitive impairment Appears to have at least MCI, possible dementia. Advanced atrophy seen on CT of brain. Tsh and b12 wnl  # Obesity noted   DVT prophylaxis: lovenox  Code Status: full Family Communication: no answer when daughter telephoned  Level of care: Med-Surg Status is: Observation    Consultants:  none  Procedures: none  Antimicrobials:  See above    Subjective: Reports occasional cough  Objective: Vitals:   12/20/23 1940 12/20/23 1945 12/21/23 0423 12/21/23 0722  BP: 128/68  (!) 141/82 139/74  Pulse: 80  65 63  Resp: 18  18 16   Temp: 98.3 F (36.8 C)  98.6 F (37 C) 98.2 F (36.8 C)  TempSrc: Oral     SpO2: 95% 94% 98% 99%  Weight:      Height:        Intake/Output Summary (Last 24 hours) at 12/21/2023 1201 Last data filed at 12/21/2023 1100 Gross per 24 hour  Intake 812.46 ml  Output 200 ml  Net 612.46 ml   Filed Weights   12/19/23 1155  Weight: 95.9 kg    Examination:  General exam: Appears  calm and comfortable  Respiratory system: faint exp wheeze Cardiovascular system: S1 & S2 heard, RRR.   Gastrointestinal system: Abdomen is obese, soft and nontender.   Central nervous system: Alert and oriented to self, place Extremities: Symmetric 5 x 5 power. Skin: No rashes, lesions or ulcers Psychiatry: Judgement and insight appear normal. Mood & affect appropriate.     Data Reviewed: I have personally reviewed following labs and imaging studies  CBC: Recent Labs  Lab 12/19/23 1207 12/20/23 0340 12/21/23 0415  WBC 11.3* 15.1* 17.7*  HGB 13.0 12.0* 11.5*   HCT 40.9 36.7* 35.6*  MCV 96.5 95.3 96.0  PLT 351 328 325   Basic Metabolic Panel: Recent Labs  Lab 12/19/23 1207 12/20/23 0340 12/21/23 0415  NA 139 138 138  K 3.9 3.8 4.4  CL 98 103 103  CO2 26 24 27   GLUCOSE 157* 185* 138*  BUN 41* 35* 43*  CREATININE 1.39* 1.03 1.02  CALCIUM  9.7 9.0 8.9   GFR: Estimated Creatinine Clearance: 58.2 mL/min (by C-G formula based on SCr of 1.02 mg/dL). Liver Function Tests: No results for input(s): "AST", "ALT", "ALKPHOS", "BILITOT", "PROT", "ALBUMIN" in the last 168 hours. No results for input(s): "LIPASE", "AMYLASE" in the last 168 hours. No results for input(s): "AMMONIA" in the last 168 hours. Coagulation Profile: No results for input(s): "INR", "PROTIME" in the last 168 hours. Cardiac Enzymes: Recent Labs  Lab 12/20/23 1009  CKTOTAL 70   BNP (last 3 results) No results for input(s): "PROBNP" in the last 8760 hours. HbA1C: Recent Labs    12/19/23 1207  HGBA1C 6.5*   CBG: Recent Labs  Lab 12/20/23 1158 12/20/23 1704 12/20/23 1942 12/21/23 0800 12/21/23 1144  GLUCAP 214* 134* 258* 128* 228*   Lipid Profile: No results for input(s): "CHOL", "HDL", "LDLCALC", "TRIG", "CHOLHDL", "LDLDIRECT" in the last 72 hours. Thyroid  Function Tests: Recent Labs    12/20/23 1009  TSH 1.702   Anemia Panel: Recent Labs    12/20/23 1009  VITAMINB12 513   Urine analysis:    Component Value Date/Time   COLORURINE YELLOW (A) 12/19/2023 1458   APPEARANCEUR HAZY (A) 12/19/2023 1458   APPEARANCEUR Clear 06/27/2022 1059   LABSPEC 1.019 12/19/2023 1458   PHURINE 5.0 12/19/2023 1458   GLUCOSEU NEGATIVE 12/19/2023 1458   GLUCOSEU NEGATIVE 08/12/2020 1605   HGBUR NEGATIVE 12/19/2023 1458   BILIRUBINUR NEGATIVE 12/19/2023 1458   BILIRUBINUR Negative 06/27/2022 1059   KETONESUR NEGATIVE 12/19/2023 1458   PROTEINUR 100 (A) 12/19/2023 1458   UROBILINOGEN 0.2 11/10/2020 1208   UROBILINOGEN 0.2 08/12/2020 1605   NITRITE NEGATIVE  12/19/2023 1458   LEUKOCYTESUR NEGATIVE 12/19/2023 1458   Sepsis Labs: @LABRCNTIP (procalcitonin:4,lacticidven:4)  ) Recent Results (from the past 240 hours)  Blood culture (routine x 2)     Status: None (Preliminary result)   Collection Time: 12/19/23  3:51 PM   Specimen: BLOOD  Result Value Ref Range Status   Specimen Description BLOOD BLOOD RIGHT FOREARM  Final   Special Requests   Final    BOTTLES DRAWN AEROBIC AND ANAEROBIC Blood Culture adequate volume   Culture   Final    NO GROWTH 2 DAYS Performed at [facility], [address] [place], [place] [address]    Report Status PENDING  Incomplete  Blood culture (routine x 2)     Status: None (Preliminary result)   Collection Time: 12/19/23  3:51 PM   Specimen: BLOOD  Result Value Ref Range Status   Specimen Description  Final    BLOOD RIGHT ANTECUBITAL Performed at [facility], [address], [place], [place] [address]    Special Requests   Final    BOTTLES DRAWN AEROBIC AND ANAEROBIC Blood Culture adequate volume Performed at [facility], [address] [place], [place] [address]    Culture  Setup Time PENDING  Incomplete   Culture   Final    NO GROWTH 2 DAYS Performed at [facility], [address], [place], [place] [address]    Report Status PENDING  Incomplete  SARS Coronavirus 2 by RT PCR (hospital order, performed in [facility] hospital lab) *cepheid single result test* Anterior Nasal Swab     Status: None   Collection Time: 12/20/23  9:05 AM   Specimen: Anterior Nasal Swab  Result Value Ref Range Status   SARS Coronavirus 2 by RT PCR NEGATIVE NEGATIVE Final    Comment: (NOTE) SARS-CoV-2 target nucleic acids are NOT DETECTED.  The SARS-CoV-2 RNA is generally detectable in upper and lower respiratory specimens during the acute phase of infection. The lowest concentration of SARS-CoV-2 viral copies this assay can detect is 250 copies / mL. A negative result does not  preclude SARS-CoV-2 infection and should not be used as the sole basis for treatment or other patient management decisions.  A negative result may occur with improper specimen collection / handling, submission of specimen other than nasopharyngeal swab, presence of viral mutation(s) within the areas targeted by this assay, and inadequate number of viral copies (<250 copies / mL). A negative result must be combined with clinical observations, patient history, and epidemiological information.  Fact Sheet for Patients:   RoadLapTop.co.za  Fact Sheet for Healthcare Providers: http://kim-miller.com/  This test is not yet approved or  cleared by the [place]  FDA and has been authorized for detection and/or diagnosis of SARS-CoV-2 by FDA under an Emergency Use Authorization (EUA).  This EUA will remain in effect (meaning this test can be used) for the duration of the COVID-19 declaration under Section 564(b)(1) of the Act, 21 U.S.C. section 360bbb-3(b)(1), unless the authorization is terminated or revoked sooner.  Performed at [facility], [address] [place], [place] [address]   Respiratory (~20 pathogens) panel by PCR     Status: None   Collection Time: 12/20/23 10:00 AM   Specimen: Nasopharyngeal Swab; Respiratory  Result Value Ref Range Status   Adenovirus NOT DETECTED NOT DETECTED Final   Coronavirus 229E NOT DETECTED NOT DETECTED Final    Comment: (NOTE) The Coronavirus on the Respiratory Panel, DOES NOT test for the novel  Coronavirus (2019 nCoV)    Coronavirus HKU1 NOT DETECTED NOT DETECTED Final   Coronavirus NL63 NOT DETECTED NOT DETECTED Final   Coronavirus OC43 NOT DETECTED NOT DETECTED Final   Metapneumovirus NOT DETECTED NOT DETECTED Final   Rhinovirus / Enterovirus NOT DETECTED NOT DETECTED Final   Influenza A NOT DETECTED NOT DETECTED Final   Influenza B NOT DETECTED NOT DETECTED Final   Parainfluenza  Virus 1 NOT DETECTED NOT DETECTED Final   Parainfluenza Virus 2 NOT DETECTED NOT DETECTED Final   Parainfluenza Virus 3 NOT DETECTED NOT DETECTED Final   Parainfluenza Virus 4 NOT DETECTED NOT DETECTED Final   Respiratory Syncytial Virus NOT DETECTED NOT DETECTED Final   Bordetella pertussis NOT DETECTED NOT DETECTED Final   Bordetella Parapertussis NOT DETECTED NOT DETECTED Final   Chlamydophila pneumoniae NOT DETECTED NOT DETECTED Final   Mycoplasma pneumoniae NOT DETECTED NOT DETECTED  Final    Comment: Performed at [facility], [address] [place], [place] [address]         Radiology Studies: MR BRAIN WO CONTRAST Result Date: 12/21/2023 CLINICAL DATA:  Initial evaluation for lower extremity weakness. EXAM: MRI HEAD WITHOUT CONTRAST TECHNIQUE: Multiplanar, multiecho pulse sequences of the brain and surrounding structures were obtained without intravenous contrast. COMPARISON:  CT from 12/19/2023 FINDINGS: Brain: Diffuse prominence of the CSF containing spaces compatible with cerebral atrophy, fairly advanced in nature. Few small remote lacunar infarcts present about the left basal ganglia/corona radiata. Small remote cortical infarct at the left occipital lobe (series 11, image 24). No evidence for acute or subacute infarct. Gray-white matter differentiation maintained. No acute or significant chronic intracranial blood products. No mass lesion, midline shift or mass effect. Ventricular prominence related to global parenchymal volume loss of hydrocephalus. No extra-axial fluid collection. Pituitary gland and suprasellar region within normal limits. Vascular: Major intracranial vascular flow voids are maintained. Skull and upper cervical spine: Cranial junction within normal limits. Bone marrow signal intensity normal. No scalp soft tissue abnormality. Sinuses/Orbits: Prior bilateral ocular lens replacement. Paranasal sinuses are largely clear. Small right mastoid effusion noted, of  doubtful significance. Image nasopharynx unremarkable. Other: None. IMPRESSION: 1. No acute intracranial abnormality. 2. Advanced cerebral atrophy with a few small remote lacunar infarcts about the left basal ganglia/corona radiata. Small remote left occipital infarct. Electronically Signed   By: Virgia Griffins M.D.   On: 12/21/2023 03:28   DG Abd 1 View Result Date: 12/20/2023 CLINICAL DATA:  [id number] Foreign body GU tract [id number] EXAM: ABDOMEN - 1 VIEW COMPARISON:  None Available. FINDINGS: The bowel gas pattern is normal. Stool within the rectum. No radio-opaque calculi or other significant radiographic abnormality are seen. Degenerative changes visualized lower lumbar spine. Atherosclerotic plaque. IMPRESSION: 1. Nonobstructive bowel gas pattern. 2. No retained radiopaque foreign body. Electronically Signed   By: Morgane  Naveau M.D.   On: 12/20/2023 17:35   CT Head Wo Contrast Result Date: 12/19/2023 CLINICAL DATA:  Weakness.  Near syncope. EXAM: CT HEAD WITHOUT CONTRAST TECHNIQUE: Contiguous axial images were obtained from the base of the skull through the vertex without intravenous contrast. RADIATION DOSE REDUCTION: This exam was performed according to the departmental dose-optimization program which includes automated exposure control, adjustment of the mA and/or kV according to patient size and/or use of iterative reconstruction technique. COMPARISON:  None Available. FINDINGS: Brain: There is no evidence of an acute infarct, acute intracranial hemorrhage, mass, or midline shift. There is a chronic infarct in the left basal ganglia and corona radiata. There is advanced cerebral atrophy with ventricular and sulcal enlargement. Mildly prominent extra-axial CSF over the frontal convexities measures up to 8 mm in thickness on the right and 6 mm on the left and may be secondary to atrophy or reflect small subdural hygromas without significant mass effect. Vascular: No hyperdense vessel. Skull: No acute  fracture or suspicious lesion. Sinuses/Orbits: Visualized paranasal sinuses and mastoid air cells are clear. Bilateral cataract extraction. Other: None. IMPRESSION: 1. No evidence of acute intracranial abnormality. 2. Chronic left basal ganglia infarct. 3. Advanced cerebral atrophy. Electronically Signed   By: Aundra Lee M.D.   On: 12/19/2023 16:01   DG Chest Port 1 View Result Date: 12/19/2023 CLINICAL DATA:  Shortness of breath and near syncopal episode chest EXAM: PORTABLE CHEST 1 VIEW COMPARISON:  Radiograph dated 11/02/2022 FINDINGS: Low lung volumes with bronchovascular crowding. Patchy medial right lower and left basilar opacities. No  pleural effusion or pneumothorax. The heart size and mediastinal contours are within normal limits. No acute osseous abnormality. IMPRESSION: Low lung volumes with bronchovascular crowding. Patchy medial right lower and left basilar opacities, which may represent atelectasis, aspiration, or pneumonia. Electronically Signed   By: Limin  Xu M.D.   On: 12/19/2023 15:10        Scheduled Meds:  amLODipine   5 mg Oral Daily   [START ON 12/22/2023] aspirin EC  81 mg Oral Daily   budesonide-glycopyrrolate-formoterol   2 puff Inhalation BID   enoxaparin  (LOVENOX ) injection  50 mg Subcutaneous Q24H   feeding supplement  237 mL Oral BID BM   finasteride   5 mg Oral Daily   furosemide  20 mg Oral Daily   insulin  aspart  0-5 Units Subcutaneous QHS   insulin  aspart  0-9 Units Subcutaneous TID WC   latanoprost   1 drop Left Eye QHS   montelukast   10 mg Oral QHS   polyethylene glycol  17 g Oral Daily   rosuvastatin   20 mg Oral Daily   tamsulosin   0.4 mg Oral Daily   Continuous Infusions:     LOS: 0 days     Raymonde Calico, MD [facility]   If 7PM-7AM, please contact night-coverage [email or web address] Password [email or web address] 12/21/2023, 12:01 PM

## 2023-12-21 NOTE — TOC Initial Note (Addendum)
 Transition of Care [facility]) - Initial/Assessment Note    Patient Details  Name: Cody Bridges MRN: [id number] Date of Birth: [birth year]  Transition of Care [facility]) CM/SW Contact:    Cody Docker, RN 12/21/2023, 1:51 PM  Clinical Narrative:                  CM to patient's room screening assessment. Patient in recliner. CM introduced case management role and discharge care planning process. Patient verbalized agreement with screening interview. Patient states lives alone and patient's daughter lives less than a mile away.  CM and patient discussed SNF recommendations. Per patient requests to discuss SNF recommendations with patient's daughter, Cody Bridges, first.  Per patient, [facility] is active in the home.   CM call to patient's daughter, Cody Bridges, phone; [phone]. No answer, CM left message for return call.   CM call to Cody Bridges, phone: [phone] regarding home health services. No answer, CM left message and return call. CM will complete SNF workup.  Call returned to patient's daughter, Cody Bridges per voicemail message. Per Cody Bridges, prefers for patient to discharge and return to [facility], where patient lives and receive home health. CM call to Cody Bridges regarding patient return. Per Cody Bridges,prefers for patient to discharge to SNF due to generalized weakness.   CM completed SNF workup, CM faxed SNF referral to [facility], [facility], and [facility] [place]. CM review in  MUST for PASSR, PASSR is [id number], approved on 11/04/2022 CM alert to Dr. Sari Bridges regarding signing FL2. CM follow up call to patient's daughter, Cody Bridges, no answer, voicemail box is full.   Expected Discharge Plan: Skilled Nursing Facility Barriers to Discharge: Continued Medical Work up   Patient Goals and CMS Choice   TBD   Expected Discharge Plan and Services    SNF   Prior Living Arrangements/Services   Lives  with:: Self Patient language and need for interpreter reviewed:: No        Need for Family Participation in Patient Care: Yes (Comment) Care giver support system in place?: Yes (comment) Current home services: DME Criminal Activity/Legal Involvement Pertinent to Current Situation/Hospitalization: No - Comment as needed  Activities of Daily Living   ADL Screening (condition at time of admission) Independently performs ADLs?: No Does the patient have a NEW difficulty with bathing/dressing/toileting/self-feeding that is expected to last >3 days?: No Does the patient have a NEW difficulty with getting in/out of bed, walking, or climbing stairs that is expected to last >3 days?: No Does the patient have a NEW difficulty with communication that is expected to last >3 days?: No Is the patient deaf or have difficulty hearing?: No Does the patient have difficulty seeing, even when wearing glasses/contacts?: Yes Does the patient have difficulty concentrating, remembering, or making decisions?: No  Permission Sought/Granted Permission sought to share information with : Case Manager, Family Supports Permission granted to share information with : Yes, Verbal Permission Granted  Share Information with NAME: Cody Bridges     Permission granted to share info w Relationship: Daughter  Permission granted to share info w Contact Information: yes  Emotional Assessment Appearance:: Appears stated age Attitude/Demeanor/Rapport: Engaged Affect (typically observed): Calm Orientation: : Oriented to Self, Oriented to Place, Oriented to Situation      Admission diagnosis:  Shortness of breath [R06.02] Pneumonia [J18.9] Pneumonia due to infectious organism, unspecified laterality, unspecified part of lung [J18.9] Patient Active Problem List   Diagnosis Date Noted   COPD exacerbation (HCC)  11/02/2022   Acute respiratory failure with hypoxia (HCC) 11/02/2022   HLD (hyperlipidemia) 11/02/2022   Chronic  diastolic CHF (congestive heart failure) (HCC) 11/02/2022   Asthma exacerbation 11/02/2022   BPH (benign prostatic hyperplasia) 06/13/2022   Loose stools 06/13/2022   DOE (dyspnea on exertion) 06/13/2022   Palpitations 06/13/2022   Allergic rhinitis 09/10/2021   Foot callus 05/31/2021   History of migraine 11/10/2020   Cardiac syncope 10/22/2019   Intramuscular lipoma 10/12/2018   Bilateral carotid artery stenosis 10/05/2018   Atherosclerotic peripheral vascular disease with intermittent claudication (HCC) 09/04/2018   Premature atrial complex 07/04/2018   Urine frequency 03/06/2018   Benign prostatic hyperplasia with urinary frequency 03/06/2018   Lower extremity weakness 05/01/2017   Orthostasis 05/01/2017   Lung nodule    Bilateral leg edema 01/26/2017   Rash and nonspecific skin eruption 10/25/2016   Diastasis recti 10/24/2016   Non-recurrent unilateral inguinal hernia without obstruction or gangrene 10/24/2016   Hypertension 10/24/2016   Diabetes mellitus without complication (HCC) 09/20/2016   COPD (chronic obstructive pulmonary disease) (HCC) 09/20/2016   Colon polyps 08/29/2014   Hyperlipemia, mixed 02/05/2014   PCP:  Cody Aus, NP Pharmacy:   [facility] [phone] [place], [place] - [address] [place] [address] Phone: [phone] Fax: [phone]  [facility] [place], [place] - [address] [place] [address] Phone: [phone] Fax: [phone]  [facility], [place], [place] - [address] [place] Brid[place] [address] Phone: [phone] Fax: [phone]     Social Drivers of Health (SDOH) Social History: SDOH Screenings   Food Insecurity: No Food Insecurity (12/19/2023)  Housing: Low Risk  (12/19/2023)  Transportation Needs: No Transportation Needs (12/19/2023)  Utilities: Not At Risk (12/19/2023)  Depression (PHQ2-9): Low Risk   (06/13/2022)  Social Connections: Moderately Integrated (12/19/2023)  Tobacco Use: Medium Risk (09/27/2023)   SDOH Interventions:     Readmission Risk Interventions     No data to display

## 2023-12-22 ENCOUNTER — Inpatient Hospital Stay

## 2023-12-22 DIAGNOSIS — R29898 Other symptoms and signs involving the musculoskeletal system: Secondary | ICD-10-CM | POA: Diagnosis not present

## 2023-12-22 LAB — LIPID PANEL
Cholesterol: 111 mg/dL (ref 0–200)
HDL: 48 mg/dL (ref >40)
LDL Cholesterol: 49 mg/dL (ref 0–99)
Total CHOL/HDL Ratio: 2.3 ratio
Triglycerides: 71 mg/dL (ref <150)
VLDL: 14 mg/dL (ref 0–40)

## 2023-12-22 LAB — BASIC METABOLIC PANEL WITH GFR
Anion gap: 7 (ref 5–15)
BUN: 43 mg/dL — ABNORMAL HIGH (ref 8–23)
CO2: 28 mmol/L (ref 22–32)
Calcium: 9 mg/dL (ref 8.9–10.3)
Chloride: 103 mmol/L (ref 98–111)
Creatinine, Ser: 1.08 mg/dL (ref 0.61–1.24)
GFR, Estimated: >60 mL/min (ref >60)
Glucose, Bld: 154 mg/dL — ABNORMAL HIGH (ref 70–99)
Potassium: 4.4 mmol/L (ref 3.5–5.1)
Sodium: 138 mmol/L (ref 135–145)

## 2023-12-22 LAB — GLUCOSE, CAPILLARY
Glucose-Capillary: 137 mg/dL — ABNORMAL HIGH (ref 70–99)
Glucose-Capillary: 112 mg/dL — ABNORMAL HIGH (ref 70–99)
Glucose-Capillary: 130 mg/dL — ABNORMAL HIGH (ref 70–99)
Glucose-Capillary: 214 mg/dL — ABNORMAL HIGH (ref 70–99)

## 2023-12-22 MED ORDER — HYDROCHLOROTHIAZIDE 25 MG PO TABS
25.0000 mg | ORAL_TABLET | Freq: Every day | ORAL | Status: DC
  Administered 2023-12-22 – 2023-12-25 (×4): 25 mg via ORAL
  Filled 2023-12-22 (×4): qty 1

## 2023-12-22 NOTE — Progress Notes (Signed)
 Physical Therapy Treatment Patient Details Name: Cody Bridges MRN: [id number] DOB: 1933/04/06 Today's Date: 12/22/2023   History of Present Illness 89 y.o. male presenting to the emergency department today following a near syncopal episode getting off the toilet at facility.  Patient reports his knees buckled and gave out when he tried to get up.  He reports associated dizziness and shortness of breath that started around the same time.    PT Comments  Pt resting in bed upon PT arrival; pt agreeable to therapy; pt requesting to toilet during session.  Pt was min to mod assist with transfers (with RW use) and CGA to min assist to ambulate 15 feet and then 10 feet with RW use.  Pt walking with B knees straight/stiff d/t pt concerns of knees buckling (d/t weakness); increased UE support noted through walker.  Will continue to focus on strengthening, balance, and progressive functional mobility during hospitalization.   If plan is discharge home, recommend the following: A little help with walking and/or transfers;A little help with bathing/dressing/bathroom;Assistance with cooking/housework;Assist for transportation   Can travel by private vehicle        Equipment Recommendations  Other (comment) (TBD at next facility)    Recommendations for Other Services       Precautions / Restrictions Precautions Precautions: Fall Restrictions Weight Bearing Restrictions Per Provider Order: No     Mobility  Bed Mobility Overal bed mobility: Needs Assistance Bed Mobility: Supine to Sit, Sit to Supine     Supine to sit: Min assist (assist for trunk) Sit to supine: Min assist (assist for B LE's)   General bed mobility comments: vc's for technique    Transfers Overall transfer level: Needs assistance Equipment used: Rolling walker (2 wheels) Transfers: Sit to/from Stand Sit to Stand: Min assist, Mod assist           General transfer comment: min assist to stand from bed; mod assist to  stand from [facility]; vc's for UE placement    Ambulation/Gait Ambulation/Gait assistance: Contact guard assist, Min assist Gait Distance (Feet):  (15 feet; 10 feet) Assistive device: Rolling walker (2 wheels) Gait Pattern/deviations: Decreased step length - right, Decreased step length - left Gait velocity: decreased     General Gait Details: Pt tending to walk with straight knees (d/t pt concerns of knees buckling); heavy UE support on RW   Stairs             Wheelchair Mobility     Tilt Bed    Modified Rankin (Stroke Patients Only)       Balance Overall balance assessment: Needs assistance Sitting-balance support: No upper extremity supported, Feet supported Sitting balance-Leahy Scale: Good Sitting balance - Comments: steady reaching within BOS   Standing balance support: Bilateral upper extremity supported, Reliant on assistive device for balance Standing balance-Leahy Scale: Fair Standing balance comment: steady static standing with B UE support on RW and B knees locked out straight (CGA for safety)                            Communication Communication Communication: Impaired Factors Affecting Communication: Hearing impaired  Cognition Arousal: Alert Behavior During Therapy: WFL for tasks assessed/performed   PT - Cognitive impairments: No apparent impairments                         Following commands: Intact      Cueing Cueing Techniques: Verbal  cues  Exercises      General Comments  Nursing cleared pt for participation in physical therapy.  Pt agreeable to PT session.      Pertinent Vitals/Pain Pain Assessment Pain Assessment: 0-10 Pain Score: 5  Pain Location: B knee pain Pain Descriptors / Indicators: Aching Pain Intervention(s): Limited activity within patient's tolerance, Monitored during session, Repositioned (pt declining pain medication--nurse notified) VSS (HR and SpO2 on room air) during session.    Home  Living                          Prior Function            PT Goals (current goals can now be found in the care plan section) Acute Rehab PT Goals Patient Stated Goal: get stronger PT Goal Formulation: With patient Time For Goal Achievement: 01/02/24 Potential to Achieve Goals: Good Progress towards PT goals: Progressing toward goals    Frequency    Min 2X/week      PT Plan      Co-evaluation              AM-PAC PT "6 Clicks" Mobility   Outcome Measure  Help needed turning from your back to your side while in a flat bed without using bedrails?: None Help needed moving from lying on your back to sitting on the side of a flat bed without using bedrails?: A Little Help needed moving to and from a bed to a chair (including a wheelchair)?: A Little Help needed standing up from a chair using your arms (e.g., wheelchair or bedside chair)?: A Lot Help needed to walk in hospital room?: A Little Help needed climbing 3-5 steps with a railing? : Total 6 Click Score: 16    End of Session Equipment Utilized During Treatment: Gait belt Activity Tolerance: Patient tolerated treatment well Patient left: in bed;with call bell/phone within reach;with bed alarm set Nurse Communication: Mobility status;Precautions;Other (comment) (Pt's pain status and pt declining pain medication) PT Visit Diagnosis: Muscle weakness (generalized) (M62.81);Unsteadiness on feet (R26.81);Difficulty in walking, not elsewhere classified (R26.2)     Time: 1639-1700 PT Time Calculation (min) (ACUTE ONLY): 21 min  Charges:    $Gait Training: 8-22 mins PT General Charges $$ ACUTE PT VISIT: 1 Visit                     Amador Junes, PT 12/22/23, 5:16 PM

## 2023-12-22 NOTE — Progress Notes (Signed)
 PROGRESS NOTE    Cody Bridges  UEA:[id number] DOB: [birth year] DOA: 12/19/2023 PCP: Nadine Aus, NP     Brief Narrative:   From admission h and p  Cody Bridges is a 89 y.o. male presenting to the emergency department today following a near syncopal episode getting off the toilet at facility.  Patient reports his knees buckled and gave out when he tried to get up.  He reports associated dizziness and shortness of breath that started around the same time. Denies LOC or fall.  On EMS arrival, patient was apparently wheezing and was placed on 2 L of nasal cannula for saturations noted in the low 90%'s.  He received Solu-Medrol  en route.  By the time my exam, patient had been weaned back to room air and reported resolution of his respiratory symptoms. He was afebrile with white count of 11, hemodynamically stable.  Chemistry fairly unremarkable except for a slight increase in his creatinine to 1.4 from baseline about 1.1.  He reports the urge to void but states he is unable to.   Chest x-ray with low lung volumes and patchy medial right lower and left basilar opacities. CT head without acute intracranial abnormality was significant for advanced cerebral atrophy. Admission was requested for further management.  Assessment & Plan:   Principal Problem:   Lower extremity weakness Active Problems:   Hypertension   Chronic diastolic CHF (congestive heart failure) (HCC)   Diabetes mellitus without complication (HCC)   COPD (chronic obstructive pulmonary disease) (HCC)   Pneumonia  # Lower extremity weakness Most likely progressive debility. Evidence prior stroke on mri, nothing acute. Ck wnl. MRI of lumbar spine shows stenosis, possible syrinx - neurosurg to see - mr thoracic spine pending - for SNF, can take him on Monday  # Cough, shortness of breath # COPD Unclear to what extent this is acute vs chronic. No o2 requirement. Basilar opacities could be infection. Procal is  normal. Breathing comfortably today, no cough. Respiratory panel neg - monitoring off abx/steroids - bretzri for trelegy    # Hx CVA On mri. Ldl 49 - started asa - continue home rosuvastatin   # T2DM Glucose appropriate, A1c 6.5% - SSI  # HTN Bp mild elevation - cont home amlodipine  - resume home hydrochlorothiazide  - home spiro on hold  # AKI Resolved - monitor with resumption of home hctz  # BPH No s/s obstruction - cont home finasteride , flomax   # Cognitive impairment Appears to have at least MCI, possible dementia. Advanced atrophy seen on CT of brain. Tsh and b12 wnl  # Obesity noted   DVT prophylaxis: lovenox  Code Status: full Family Communication: no answer when daughter telephoned  Level of care: Med-Surg Status is: inpt    Consultants:  neurosurgery  Procedures: none  Antimicrobials:  See above    Subjective: Feeling fine, bm just now  Objective: Vitals:   12/21/23 1529 12/21/23 1939 12/22/23 0352 12/22/23 0743  BP: 124/65 133/65 137/85 (!) 155/83  Pulse: 74 75 63 70  Resp: 16 20 20 16   Temp: 98.9 F (37.2 C) 98 F (36.7 C) (!) 97.5 F (36.4 C) 97.9 F (36.6 C)  TempSrc:    Oral  SpO2: 98% 99% 99% 95%  Weight:      Height:        Intake/Output Summary (Last 24 hours) at 12/22/2023 1419 Last data filed at 12/22/2023 0442 Gross per 24 hour  Intake --  Output 800 ml  Net -800 ml  Filed Weights   12/19/23 1155  Weight: 95.9 kg    Examination:  General exam: Appears calm and comfortable  Respiratory system: normal wob, few scattered faint rhonchi otherwise clear Cardiovascular system: S1 & S2 heard, RRR.   Gastrointestinal system: Abdomen is obese, soft and nontender.   Central nervous system: Alert and oriented to self, place Extremities: Symmetric 5 x 5 power. Skin: No rashes, lesions or ulcers Psychiatry: Judgement and insight appear normal. Mood & affect appropriate.     Data Reviewed: I have personally reviewed  following labs and imaging studies  CBC: Recent Labs  Lab 12/19/23 1207 12/20/23 0340 12/21/23 0415  WBC 11.3* 15.1* 17.7*  HGB 13.0 12.0* 11.5*  HCT 40.9 36.7* 35.6*  MCV 96.5 95.3 96.0  PLT 351 328 325   Basic Metabolic Panel: Recent Labs  Lab 12/19/23 1207 12/20/23 0340 12/21/23 0415 12/22/23 0142  NA 139 138 138 138  K 3.9 3.8 4.4 4.4  CL 98 103 103 103  CO2 26 24 27 28   GLUCOSE 157* 185* 138* 154*  BUN 41* 35* 43* 43*  CREATININE 1.39* 1.03 1.02 1.08  CALCIUM  9.7 9.0 8.9 9.0   GFR: Estimated Creatinine Clearance: 55 mL/min (by C-G formula based on SCr of 1.08 mg/dL). Liver Function Tests: No results for input(s): "AST", "ALT", "ALKPHOS", "BILITOT", "PROT", "ALBUMIN" in the last 168 hours. No results for input(s): "LIPASE", "AMYLASE" in the last 168 hours. No results for input(s): "AMMONIA" in the last 168 hours. Coagulation Profile: No results for input(s): "INR", "PROTIME" in the last 168 hours. Cardiac Enzymes: Recent Labs  Lab 12/20/23 1009  CKTOTAL 70   BNP (last 3 results) No results for input(s): "PROBNP" in the last 8760 hours. HbA1C: No results for input(s): "HGBA1C" in the last 72 hours.  CBG: Recent Labs  Lab 12/21/23 1144 12/21/23 1725 12/21/23 2056 12/22/23 0745 12/22/23 1212  GLUCAP 228* 259* 211* 137* 112*   Lipid Profile: Recent Labs    12/22/23 0142  CHOL 111  HDL 48  LDLCALC 49  TRIG 71  CHOLHDL 2.3   Thyroid  Function Tests: Recent Labs    12/20/23 1009  TSH 1.702   Anemia Panel: Recent Labs    12/20/23 1009  VITAMINB12 513   Urine analysis:    Component Value Date/Time   COLORURINE YELLOW (A) 12/19/2023 1458   APPEARANCEUR HAZY (A) 12/19/2023 1458   APPEARANCEUR Clear 06/27/2022 1059   LABSPEC 1.019 12/19/2023 1458   PHURINE 5.0 12/19/2023 1458   GLUCOSEU NEGATIVE 12/19/2023 1458   GLUCOSEU NEGATIVE 08/12/2020 1605   HGBUR NEGATIVE 12/19/2023 1458   BILIRUBINUR NEGATIVE 12/19/2023 1458   BILIRUBINUR  Negative 06/27/2022 1059   KETONESUR NEGATIVE 12/19/2023 1458   PROTEINUR 100 (A) 12/19/2023 1458   UROBILINOGEN 0.2 11/10/2020 1208   UROBILINOGEN 0.2 08/12/2020 1605   NITRITE NEGATIVE 12/19/2023 1458   LEUKOCYTESUR NEGATIVE 12/19/2023 1458   Sepsis Labs: @LABRCNTIP (procalcitonin:4,lacticidven:4)  ) Recent Results (from the past 240 hours)  Blood culture (routine x 2)     Status: None (Preliminary result)   Collection Time: 12/19/23  3:51 PM   Specimen: BLOOD  Result Value Ref Range Status   Specimen Description BLOOD BLOOD RIGHT FOREARM  Final   Special Requests   Final    BOTTLES DRAWN AEROBIC AND ANAEROBIC Blood Culture adequate volume   Culture   Final    NO GROWTH 3 DAYS Performed at [facility], [address] [place], [place] [address]    Report Status  PENDING  Incomplete  Blood culture (routine x 2)     Status: None (Preliminary result)   Collection Time: 12/19/23  3:51 PM   Specimen: BLOOD  Result Value Ref Range Status   Specimen Description   Final    BLOOD RIGHT ANTECUBITAL Performed at [facility], [address], [place], [place] [address]    Special Requests   Final    BOTTLES DRAWN AEROBIC AND ANAEROBIC Blood Culture adequate volume Performed at [facility], [address] [place], [place] [address]    Culture   Final    NO GROWTH 3 DAYS Performed at [facility], [address] [place], [place] [address]    Report Status PENDING  Incomplete  SARS Coronavirus 2 by RT PCR (hospital order, performed in [facility] hospital lab) *cepheid single result test* Anterior Nasal Swab     Status: None   Collection Time: 12/20/23  9:05 AM   Specimen: Anterior Nasal Swab  Result Value Ref Range Status   SARS Coronavirus 2 by RT PCR NEGATIVE NEGATIVE Final    Comment: (NOTE) SARS-CoV-2 target nucleic acids are NOT DETECTED.  The SARS-CoV-2 RNA is generally detectable in upper and lower respiratory specimens during the  acute phase of infection. The lowest concentration of SARS-CoV-2 viral copies this assay can detect is 250 copies / mL. A negative result does not preclude SARS-CoV-2 infection and should not be used as the sole basis for treatment or other patient management decisions.  A negative result may occur with improper specimen collection / handling, submission of specimen other than nasopharyngeal swab, presence of viral mutation(s) within the areas targeted by this assay, and inadequate number of viral copies (<250 copies / mL). A negative result must be combined with clinical observations, patient history, and epidemiological information.  Fact Sheet for Patients:   RoadLapTop.co.za  Fact Sheet for Healthcare Providers: http://kim-miller.com/  This test is not yet approved or  cleared by the [place]  FDA and has been authorized for detection and/or diagnosis of SARS-CoV-2 by FDA under an Emergency Use Authorization (EUA).  This EUA will remain in effect (meaning this test can be used) for the duration of the COVID-19 declaration under Section 564(b)(1) of the Act, 21 U.S.C. section 360bbb-3(b)(1), unless the authorization is terminated or revoked sooner.  Performed at [facility], [address] [place], [place] [address]   Respiratory (~20 pathogens) panel by PCR     Status: None   Collection Time: 12/20/23 10:00 AM   Specimen: Nasopharyngeal Swab; Respiratory  Result Value Ref Range Status   Adenovirus NOT DETECTED NOT DETECTED Final   Coronavirus 229E NOT DETECTED NOT DETECTED Final    Comment: (NOTE) The Coronavirus on the Respiratory Panel, DOES NOT test for the novel  Coronavirus (2019 nCoV)    Coronavirus HKU1 NOT DETECTED NOT DETECTED Final   Coronavirus NL63 NOT DETECTED NOT DETECTED Final   Coronavirus OC43 NOT DETECTED NOT DETECTED Final   Metapneumovirus NOT DETECTED NOT DETECTED Final   Rhinovirus /  Enterovirus NOT DETECTED NOT DETECTED Final   Influenza A NOT DETECTED NOT DETECTED Final   Influenza B NOT DETECTED NOT DETECTED Final   Parainfluenza Virus 1 NOT DETECTED NOT DETECTED Final   Parainfluenza Virus 2 NOT DETECTED NOT DETECTED Final   Parainfluenza Virus 3 NOT DETECTED NOT DETECTED Final   Parainfluenza Virus 4 NOT DETECTED NOT DETECTED Final   Respiratory Syncytial Virus NOT DETECTED NOT DETECTED Final   Bordetella  pertussis NOT DETECTED NOT DETECTED Final   Bordetella Parapertussis NOT DETECTED NOT DETECTED Final   Chlamydophila pneumoniae NOT DETECTED NOT DETECTED Final   Mycoplasma pneumoniae NOT DETECTED NOT DETECTED Final    Comment: Performed at [facility], [address], [place], [place] [address]         Radiology Studies: MR LUMBAR SPINE WO CONTRAST Result Date: 12/22/2023 CLINICAL DATA:  Initial evaluation for acute lower extremity weakness. EXAM: MRI LUMBAR SPINE WITHOUT CONTRAST TECHNIQUE: Multiplanar, multisequence MR imaging of the lumbar spine was performed. No intravenous contrast was administered. COMPARISON:  None Available. FINDINGS: Segmentation: Standard. Lowest well-formed disc space labeled the L5-S1 level. Alignment: Moderate dextroscoliosis. 4 mm retrolisthesis of L5 on S1. Vertebrae: Vertebral body height maintained without acute or chronic fracture. Bone marrow signal intensity within normal limits. No worrisome osseous lesions. No abnormal marrow edema. Conus medullaris and cauda equina: Conus extends to the L1 level. There is question of a small syrinx involving the partially visualized lower thoracic spinal cord (series 6, image 11). Finding is not entirely certain and may simply reflect averaging with the central canal. Conus otherwise within normal limits. Nerve roots of the cauda equina within normal limits. Paraspinal and other soft tissues: Paraspinous soft tissues demonstrate no acute finding. Disc levels: T12-L1: Degenerative  intervertebral disc space narrowing with disc desiccation and diffuse disc bulge. Superimposed small central to left subarticular disc protrusion. Mild facet hypertrophy. No spinal stenosis. Foramina remain patent. L1-2: Degenerative intervertebral disc space narrowing with diffuse disc bulge. Reactive endplate spurring. Mild bilateral facet hypertrophy. No spinal stenosis. Foramina remain patent. L2-3: Degenerative intervertebral disc space narrowing with diffuse disc bulge and disc desiccation. Reactive endplate spurring, worse on the left. Superimposed small central to left subarticular disc protrusion with annular fissure indents the ventral thecal sac (series 8, image 14). Mild facet hypertrophy. Mild narrowing of the left lateral recess. Central canal remains patent. No significant foraminal stenosis. L3-4: Degenerative intervertebral disc space narrowing with diffuse disc bulge and reactive endplate spurring. Mild to moderate facet hypertrophy. Resultant mild canal with left lateral recess stenosis. Mild to moderate left L3 foraminal narrowing. Right neural foramen remains patent. L4-5: Degenerate intervertebral disc space narrowing with diffuse disc bulge and disc desiccation. Reactive endplate change with marginal endplate osteophytic spurring. Moderate facet and ligament flavum hypertrophy. Mild epidural lipomatosis. Resultant severe spinal stenosis with the thecal sac measuring 4 mm in AP diameter. Mild bilateral L4 foraminal narrowing. L5-S1: Retrolisthesis with advanced degenerative intervertebral disc space narrowing. Diffuse disc bulge with reactive endplate spurring. Moderate bilateral facet hypertrophy. Epidural lipomatosis. No significant spinal stenosis. Moderate right with mild left L5 foraminal narrowing. IMPRESSION: 1. Multifactorial degenerative changes at L4-5 with resultant severe spinal stenosis. 2. Additional multilevel degenerative spondylosis and facet hypertrophy as above. No other  high-grade spinal stenosis. Mild to moderate left L3 and right L5 foraminal narrowing as above. 3. Question small syrinx involving the partially visualized lower thoracic spinal cord. Finding is not entirely certain and may simply reflect averaging with the central canal. Follow-up examination with dedicated MRI of the thoracic spine suggested for further evaluation. Electronically Signed   By: Virgia Griffins M.D.   On: 12/22/2023 00:18   MR BRAIN WO CONTRAST Result Date: 12/21/2023 CLINICAL DATA:  Initial evaluation for lower extremity weakness. EXAM: MRI HEAD WITHOUT CONTRAST TECHNIQUE: Multiplanar, multiecho pulse sequences of the brain and surrounding structures were obtained without intravenous contrast. COMPARISON:  CT from 12/19/2023 FINDINGS: Brain: Diffuse prominence of the CSF  containing spaces compatible with cerebral atrophy, fairly advanced in nature. Few small remote lacunar infarcts present about the left basal ganglia/corona radiata. Small remote cortical infarct at the left occipital lobe (series 11, image 24). No evidence for acute or subacute infarct. Gray-white matter differentiation maintained. No acute or significant chronic intracranial blood products. No mass lesion, midline shift or mass effect. Ventricular prominence related to global parenchymal volume loss of hydrocephalus. No extra-axial fluid collection. Pituitary gland and suprasellar region within normal limits. Vascular: Major intracranial vascular flow voids are maintained. Skull and upper cervical spine: Cranial junction within normal limits. Bone marrow signal intensity normal. No scalp soft tissue abnormality. Sinuses/Orbits: Prior bilateral ocular lens replacement. Paranasal sinuses are largely clear. Small right mastoid effusion noted, of doubtful significance. Image nasopharynx unremarkable. Other: None. IMPRESSION: 1. No acute intracranial abnormality. 2. Advanced cerebral atrophy with a few small remote lacunar  infarcts about the left basal ganglia/corona radiata. Small remote left occipital infarct. Electronically Signed   By: Virgia Griffins M.D.   On: 12/21/2023 03:28        Scheduled Meds:  amLODipine   5 mg Oral Daily   aspirin EC  81 mg Oral Daily   budesonide-glycopyrrolate-formoterol   2 puff Inhalation BID   enoxaparin  (LOVENOX ) injection  50 mg Subcutaneous Q24H   feeding supplement  237 mL Oral BID BM   finasteride   5 mg Oral Daily   furosemide  20 mg Oral Daily   insulin  aspart  0-5 Units Subcutaneous QHS   insulin  aspart  0-9 Units Subcutaneous TID WC   latanoprost   1 drop Left Eye QHS   montelukast   10 mg Oral QHS   polyethylene glycol  17 g Oral Daily   rosuvastatin   20 mg Oral Daily   tamsulosin   0.4 mg Oral Daily   Continuous Infusions:     LOS: 1 day     Raymonde Calico, MD [facility]   If 7PM-7AM, please contact night-coverage [email or web address] Password [email or web address] 12/22/2023, 2:19 PM

## 2023-12-22 NOTE — Plan of Care (Signed)

## 2023-12-22 NOTE — Plan of Care (Signed)
  Problem: Coping: Goal: Ability to adjust to condition or change in health will improve Outcome: Progressing   Problem: Clinical Measurements: Goal: Diagnostic test results will improve Outcome: Progressing   Problem: Activity: Goal: Risk for activity intolerance will decrease Outcome: Progressing   Problem: Coping: Goal: Level of anxiety will decrease Outcome: Progressing   Problem: Safety: Goal: Ability to remain free from injury will improve Outcome: Progressing

## 2023-12-22 NOTE — TOC Progression Note (Signed)
 Transition of Care [facility]) - Progression Note    Patient Details  Name: Cody Bridges MRN: [id number] Date of Birth: [birth year]  [facility] CM/SW Contact  Cody Ice, RN Phone Number: 12/22/2023, 12:26 PM  Clinical Narrative:    Received call from [name], daughter, she was inquiring about update on bed search for SNF. Her first choice would be [facility]. TOC looked and search was not done for that facility, TOC redid besearch to include that facility.  Received message from facility that they can accept patient on Monday, updated daughter, MD and bedside nurse.    Expected Discharge Plan: Skilled Nursing Facility Barriers to Discharge: Continued Medical Work up  Expected Discharge Plan and Services                                               Social Determinants of Health (SDOH) Interventions SDOH Screenings   Food Insecurity: No Food Insecurity (12/19/2023)  Housing: Low Risk  (12/19/2023)  Transportation Needs: No Transportation Needs (12/19/2023)  Utilities: Not At Risk (12/19/2023)  Depression (PHQ2-9): Low Risk  (06/13/2022)  Social Connections: Moderately Integrated (12/19/2023)  Tobacco Use: Medium Risk (09/27/2023)    Readmission Risk Interventions     No data to display

## 2023-12-23 DIAGNOSIS — R29898 Other symptoms and signs involving the musculoskeletal system: Secondary | ICD-10-CM | POA: Diagnosis not present

## 2023-12-23 DIAGNOSIS — M48062 Spinal stenosis, lumbar region with neurogenic claudication: Secondary | ICD-10-CM | POA: Diagnosis not present

## 2023-12-23 LAB — BASIC METABOLIC PANEL WITH GFR
Anion gap: 11 (ref 5–15)
BUN: 40 mg/dL — ABNORMAL HIGH (ref 8–23)
CO2: 30 mmol/L (ref 22–32)
Calcium: 8.7 mg/dL — ABNORMAL LOW (ref 8.9–10.3)
Chloride: 90 mmol/L — ABNORMAL LOW (ref 98–111)
Creatinine, Ser: 1.12 mg/dL (ref 0.61–1.24)
GFR, Estimated: >60 mL/min (ref >60)
Glucose, Bld: 142 mg/dL — ABNORMAL HIGH (ref 70–99)
Potassium: 4.2 mmol/L (ref 3.5–5.1)
Sodium: 135 mmol/L (ref 135–145)

## 2023-12-23 LAB — GLUCOSE, CAPILLARY
Glucose-Capillary: 141 mg/dL — ABNORMAL HIGH (ref 70–99)
Glucose-Capillary: 198 mg/dL — ABNORMAL HIGH (ref 70–99)
Glucose-Capillary: 175 mg/dL — ABNORMAL HIGH (ref 70–99)
Glucose-Capillary: 212 mg/dL — ABNORMAL HIGH (ref 70–99)
Glucose-Capillary: 161 mg/dL — ABNORMAL HIGH (ref 70–99)

## 2023-12-23 NOTE — Consult Note (Signed)
 Consult requested by:  Dr. Sari Cunning  Consult requested for:  Lumbar stenosis, difficulty walking  Primary Physician:  Nadine Aus, NP  History of Present Illness: 12/23/2023 [name] is here today with a chief complaint of difficulty with getting off the toilet at his facility.  His knee buckled and gave out.  He has had some difficulty with walking but uses a walker.  When he presented, he did have some evidence of worsening of his kidney failure.  He denies leg pain.  He denies numbness.  He denies difficulty with urination or bowel control.   I have utilized the care everywhere function in epic to review the outside records available from external health systems.  Review of Systems:  A 10 point review of systems is negative, except for the pertinent positives and negatives detailed in the HPI.  Past Medical History: Past Medical History:  Diagnosis Date   Actinic keratosis    Arthritis    Asthma    COPD (chronic obstructive pulmonary disease) (HCC)    COVID-19 02/13/2021   Diabetes mellitus without complication (HCC)     2015   Dyspnea    with exertion   Edema    GERD (gastroesophageal reflux disease)    HOH (hard of hearing)    AIDS   Hyperlipidemia    Hypertension    Wheezing     Past Surgical History: Past Surgical History:  Procedure Laterality Date   BUNIONECTOMY Right    CATARACT EXTRACTION W/PHACO Left 08/22/2017   Procedure: CATARACT EXTRACTION PHACO AND INTRAOCULAR LENS PLACEMENT (IOC);  Surgeon: Clair Crews, MD;  Location: [facility];  Service: Ophthalmology;  Laterality: Left;  US  00:43.7 AP% 12.3 CDE 5.38 Fluid pack lot # [id number]   COLONOSCOPY  2016   ELECTROMAGNETIC NAVIGATION BROCHOSCOPY N/A 02/09/2017   Procedure: ELECTROMAGNETIC NAVIGATION BRONCHOSCOPY;  Surgeon: Cleve Dale, MD;  Location: [facility];  Service: Cardiopulmonary;  Laterality: N/A;   EYE SURGERY Right    Cataract Extraction with IOL   HAMMER TOE SURGERY Right     TONSILLECTOMY  1938    Allergies: Allergies as of 12/19/2023 - Review Complete 12/19/2023  Allergen Reaction Noted   Clindamycin Hives 02/05/2014   Lisinopril Cough 09/04/2018   Penicillin v potassium Rash and Other (See Comments) 02/20/2014    Medications: Current Facility-Administered Medications for the 12/19/23 encounter [facility] Encounter)  Medication   dupilumab  (DUPIXENT ) prefilled syringe 300 mg   dupilumab  (DUPIXENT ) prefilled syringe 300 mg   dupilumab  (DUPIXENT ) prefilled syringe 300 mg   Current Meds  Medication Sig   acetaminophen  (TYLENOL ) 500 MG tablet Take 1,000 mg by mouth every 6 (six) hours as needed for mild pain or moderate pain.   albuterol  (VENTOLIN  HFA) 108 (90 Base) MCG/ACT inhaler Inhale 2 puffs into the lungs every 6 (six) hours as needed for wheezing or shortness of breath. (Patient taking differently: Inhale 2 puffs into the lungs every 12 (twelve) hours.)   amLODipine  (NORVASC ) 5 MG tablet Take 5 mg by mouth daily.   Azelastine  HCl 137 MCG/SPRAY SOLN PLACE 2 SPRAYS INTO BOTH NOSTRILS 2 (TWO) TIMES DAILY. USE IN EACH NOSTRIL AS DIRECTED   calcium  carbonate (TUMS - DOSED IN MG ELEMENTAL CALCIUM ) 500 MG chewable tablet Chew 1-2 tablets by mouth 3 (three) times daily as needed (for acid reflux/indigestion.).   carboxymethylcellulose (REFRESH PLUS) 0.5 % SOLN Apply 1 drop to eye 3 (three) times daily as needed (dry eyes).   Cholecalciferol  (VITAMIN D -3) 5000 units TABS Take  5,000 Units by mouth daily.    dextromethorphan -guaiFENesin  (MUCINEX  DM) 30-600 MG 12hr tablet Take 1 tablet by mouth 2 (two) times daily as needed for cough.   Emollient (CERAVE DAILY MOISTURIZING) LOTN Apply 1 application topically daily as needed (dry skin).   finasteride  (PROSCAR ) 5 MG tablet Take 1 tablet (5 mg total) by mouth daily.   Fluticasone -Umeclidin-Vilant (TRELEGY ELLIPTA ) 100-62.5-25 MCG/ACT AEPB Inhale 1 puff into the lungs daily.   furosemide (LASIX) 20 MG tablet Take 20  mg by mouth daily.   ipratropium-albuterol  (DUONEB) 0.5-2.5 (3) MG/3ML SOLN Take 3 mLs by nebulization every 4 (four) hours as needed.   latanoprost  (XALATAN ) 0.005 % ophthalmic solution Place 1 drop into the left eye at bedtime.   metFORMIN  (GLUCOPHAGE ) 500 MG tablet TAKE 1 TABLET BY MOUTH TWICE A DAY   mometasone -formoterol  (DULERA ) 100-5 MCG/ACT AERO Inhale 2 puffs into the lungs 2 (two) times daily.   montelukast  (SINGULAIR ) 10 MG tablet Take 1 tablet (10 mg total) by mouth at bedtime.   Multiple Vitamin (MULTIVITAMIN WITH MINERALS) TABS tablet Take 1 tablet by mouth daily.   Multiple Vitamins-Minerals (PRESERVISION AREDS PO) Take 1 capsule by mouth in the morning and at bedtime.   polyethylene glycol (MIRALAX  / GLYCOLAX ) 17 g packet Take 17 g by mouth daily.   rosuvastatin  (CRESTOR ) 20 MG tablet TAKE 1 TABLET BY MOUTH EVERY DAY   sodium chloride  (OCEAN) 0.65 % SOLN nasal spray Place 1 spray into both nostrils as needed for congestion.   spironolactone (ALDACTONE) 25 MG tablet Take 12.5 mg by mouth 3 (three) times a week. Monday, Wednesday and friday   tamsulosin  (FLOMAX ) 0.4 MG CAPS capsule Take 1 capsule (0.4 mg total) by mouth daily.    Social History: Social History   Tobacco Use   Smoking status: Former    Current packs/day: 0.00    Average packs/day: 2.0 packs/day for 35.0 years (70.0 ttl pk-yrs)    Types: Cigarettes    Start date: 10/31/1950    Quit date: 10/30/1985    Years since quitting: 38.1   Smokeless tobacco: Never  Vaping Use   Vaping status: Never Used  Substance Use Topics   Alcohol  use: Yes    Comment: occasional   Drug use: No    Family Medical History: Family History  Problem Relation Age of Onset   Cancer Mother    Stomach cancer Mother    Stomach cancer Sister     Physical Examination: Vitals:   12/23/23 0452 12/23/23 0743  BP: (!) 118/56 136/76  Pulse: 63 72  Resp: 18 18  Temp: 97.6 F (36.4 C) 97.9 F (36.6 C)  SpO2: 95% 98%     General: Patient is in no apparent distress. Attention to examination is appropriate.  Neck:   Supple.  Full range of motion.  Respiratory: Patient is breathing without any difficulty.   NEUROLOGICAL:     Awake, alert, oriented to person, "don't know where", and 2025 with choices. He doesn't know the month even with choices. Speech is clear and fluent.  Cranial Nerves: Pupils equal round and reactive to light.  Facial tone is symmetric.  Facial sensation is symmetric. Shoulder shrug is symmetric. Tongue protrusion is midline.    Strength: Side Biceps Triceps Deltoid Interossei Grip Wrist Ext. Wrist Flex.  R 5 5 5 5 5 5 5   L 5 5 5 5 5 5 5    Side Iliopsoas Quads Hamstring PF DF EHL  R 5 5 5 5 5  5  L 5 5 5 5 5 5    Reflexes are 1+ and symmetric at the biceps, triceps, brachioradialis, patella and achilles.   Hoffman's is absent.   Bilateral upper and lower extremity sensation is intact to light touch.    No evidence of dysmetria noted.  Gait is untested.     Medical Decision Making  Imaging: T spine MRI 12/22/2023 IMPRESSION: 1. 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 Maximum diameter of the syrinx is 4.5 mm at the T9 level. No mass lesion is present. Cord signal and morphology are otherwise normal. 2. Moderate right foraminal stenosis at T11-12 due to rightward disc protrusion and asymmetric facet hypertrophy.   Electronically signed by: Audree Leas MD 12/22/2023 03:45 PM EDT RP Workstation: [id number]  L spine MRI 12/21/2023  IMPRESSION: 1. Multifactorial degenerative changes at L4-5 with resultant severe spinal stenosis. 2. Additional multilevel degenerative spondylosis and facet hypertrophy as above. No other high-grade spinal stenosis. Mild to moderate left L3 and right L5 foraminal narrowing as above. 3. Question  small syrinx involving the partially visualized lower thoracic spinal cord. Finding is not entirely certain and may simply reflect averaging with the central canal. Follow-up examination with dedicated MRI of the thoracic spine suggested for further evaluation.     Electronically Signed   By: Virgia Griffins M.D.   On: 12/22/2023 00:18  I have personally reviewed the images and agree with the above interpretation.  Assessment and Plan: [name] is a pleasant 89 y.o. male with lumbar stenosis with some difficulty getting up from the toilet.  I do not think the lumbar stenosis is likely to be the cause of this.  He may have some element of neurogenic claudication.  I would not recommend urgent surgical intervention.  Will arrange outpatient follow-up for him.  I would recommend that he do outpatient physical therapy to see if this will help with his gait and movement.    I have communicated my recommendations to the requesting physician and coordinated care to facilitate these recommendations.     Belvin Gauss K. Mont Antis MD, [facility]

## 2023-12-23 NOTE — Progress Notes (Signed)
 PROGRESS NOTE    Cody Bridges  MWN:[id number] DOB: Sep 08, 1932 DOA: 12/19/2023 PCP: Nadine Aus, NP     Brief Narrative:   From admission h and p  Cody Bridges is a 89 y.o. male presenting to the emergency department today following a near syncopal episode getting off the toilet at facility.  Patient reports his knees buckled and gave out when he tried to get up.  He reports associated dizziness and shortness of breath that started around the same time. Denies LOC or fall.  On EMS arrival, patient was apparently wheezing and was placed on 2 L of nasal cannula for saturations noted in the low 90%'s.  He received Solu-Medrol  en route.  By the time my exam, patient had been weaned back to room air and reported resolution of his respiratory symptoms. He was afebrile with white count of 11, hemodynamically stable.  Chemistry fairly unremarkable except for a slight increase in his creatinine to 1.4 from baseline about 1.1.  He reports the urge to void but states he is unable to.   Chest x-ray with low lung volumes and patchy medial right lower and left basilar opacities. CT head without acute intracranial abnormality was significant for advanced cerebral atrophy. Admission was requested for further management.  Assessment & Plan:   Principal Problem:   Lower extremity weakness Active Problems:   Hypertension   Chronic diastolic CHF (congestive heart failure) (HCC)   Diabetes mellitus without complication (HCC)   COPD (chronic obstructive pulmonary disease) (HCC)   Pneumonia   Spinal stenosis, lumbar region, with neurogenic claudication  # Lower extremity weakness # Lumbar stenosis Most likely progressive debility. Evidence prior stroke on mri, nothing acute. Ck wnl. MRI of lumbar spine shows stenosis, syrinx. Neurosurg has evaluated, chronic issues, nothing to do for the time being. Currently resides at assisted living facility.  - for SNF, can take him on Monday  # Cough,  shortness of breath # COPD Unclear to what extent this is acute vs chronic. No o2 requirement. Basilar opacities could be infection. Procal is normal. Breathing comfortably , no cough. Respiratory panel neg - monitoring off abx/steroids - bretzri for trelegy    # Hx CVA On mri. Ldl 49 - started asa - continue home rosuvastatin   # T2DM Glucose appropriate, A1c 6.5% - SSI  # HTN Bp mild elevation - cont home amlodipine  - resume home hydrochlorothiazide  - home spiro on hold  # AKI Resolved - monitor with resumption of home hctz  # BPH No s/s obstruction - cont home finasteride , flomax   # Cognitive impairment Appears to have at least MCI, possible dementia. Advanced atrophy seen on CT of brain. Tsh and b12 wnl  # Obesity noted   DVT prophylaxis: lovenox  Code Status: full Family Communication: daughter updated telephonically 5/24  Level of care: Med-Surg Status is: inpt    Consultants:  neurosurgery  Procedures: none  Antimicrobials:  See above    Subjective: Feeling fine, bm today, tolerating diet, breathing stable.  Objective: Vitals:   12/22/23 1553 12/22/23 1958 12/23/23 0452 12/23/23 0743  BP: 122/66 114/68 (!) 118/56 136/76  Pulse: 78 92 63 72  Resp: 17 16 18 18   Temp: 98.1 F (36.7 C) 97.8 F (36.6 C) 97.6 F (36.4 C) 97.9 F (36.6 C)  TempSrc: Oral   Oral  SpO2: 98% 96% 95% 98%  Weight:      Height:        Intake/Output Summary (Last 24 hours) at 12/23/2023 1604 Last  data filed at 12/23/2023 1200 Gross per 24 hour  Intake 477 ml  Output 700 ml  Net -223 ml   Filed Weights   12/19/23 1155  Weight: 95.9 kg    Examination:  General exam: Appears calm and comfortable  Respiratory system: normal wob, few scattered faint rhonchi otherwise clear Cardiovascular system: S1 & S2 heard, RRR.   Gastrointestinal system: Abdomen is obese, soft and nontender.   Central nervous system: Alert and oriented to self, place Extremities:  Symmetric 5 x 5 power. Skin: No rashes, lesions or ulcers Psychiatry: Judgement and insight appear normal. Mood & affect appropriate.     Data Reviewed: I have personally reviewed following labs and imaging studies  CBC: Recent Labs  Lab 12/19/23 1207 12/20/23 0340 12/21/23 0415  WBC 11.3* 15.1* 17.7*  HGB 13.0 12.0* 11.5*  HCT 40.9 36.7* 35.6*  MCV 96.5 95.3 96.0  PLT 351 328 325   Basic Metabolic Panel: Recent Labs  Lab 12/19/23 1207 12/20/23 0340 12/21/23 0415 12/22/23 0142 12/23/23 0503  NA 139 138 138 138 135  K 3.9 3.8 4.4 4.4 4.2  CL 98 103 103 103 90*  CO2 26 24 27 28 30   GLUCOSE 157* 185* 138* 154* 142*  BUN 41* 35* 43* 43* 40*  CREATININE 1.39* 1.03 1.02 1.08 1.12  CALCIUM  9.7 9.0 8.9 9.0 8.7*   GFR: Estimated Creatinine Clearance: 53 mL/min (by C-G formula based on SCr of 1.12 mg/dL). Liver Function Tests: No results for input(s): "AST", "ALT", "ALKPHOS", "BILITOT", "PROT", "ALBUMIN" in the last 168 hours. No results for input(s): "LIPASE", "AMYLASE" in the last 168 hours. No results for input(s): "AMMONIA" in the last 168 hours. Coagulation Profile: No results for input(s): "INR", "PROTIME" in the last 168 hours. Cardiac Enzymes: Recent Labs  Lab 12/20/23 1009  CKTOTAL 70   BNP (last 3 results) No results for input(s): "PROBNP" in the last 8760 hours. HbA1C: No results for input(s): "HGBA1C" in the last 72 hours.  CBG: Recent Labs  Lab 12/22/23 1212 12/22/23 1633 12/22/23 2125 12/23/23 0745 12/23/23 1211  GLUCAP 112* 130* 214* 141* 198*   Lipid Profile: Recent Labs    12/22/23 0142  CHOL 111  HDL 48  LDLCALC 49  TRIG 71  CHOLHDL 2.3   Thyroid  Function Tests: No results for input(s): "TSH", "T4TOTAL", "FREET4", "T3FREE", "THYROIDAB" in the last 72 hours.  Anemia Panel: No results for input(s): "VITAMINB12", "FOLATE", "FERRITIN", "TIBC", "IRON", "RETICCTPCT" in the last 72 hours.  Urine analysis:    Component Value  Date/Time   COLORURINE YELLOW (A) 12/19/2023 1458   APPEARANCEUR HAZY (A) 12/19/2023 1458   APPEARANCEUR Clear 06/27/2022 1059   LABSPEC 1.019 12/19/2023 1458   PHURINE 5.0 12/19/2023 1458   GLUCOSEU NEGATIVE 12/19/2023 1458   GLUCOSEU NEGATIVE 08/12/2020 1605   HGBUR NEGATIVE 12/19/2023 1458   BILIRUBINUR NEGATIVE 12/19/2023 1458   BILIRUBINUR Negative 06/27/2022 1059   KETONESUR NEGATIVE 12/19/2023 1458   PROTEINUR 100 (A) 12/19/2023 1458   UROBILINOGEN 0.2 11/10/2020 1208   UROBILINOGEN 0.2 08/12/2020 1605   NITRITE NEGATIVE 12/19/2023 1458   LEUKOCYTESUR NEGATIVE 12/19/2023 1458   Sepsis Labs: @LABRCNTIP (procalcitonin:4,lacticidven:4)  ) Recent Results (from the past 240 hours)  Blood culture (routine x 2)     Status: None (Preliminary result)   Collection Time: 12/19/23  3:51 PM   Specimen: BLOOD  Result Value Ref Range Status   Specimen Description BLOOD BLOOD RIGHT FOREARM  Final   Special Requests   Final  BOTTLES DRAWN AEROBIC AND ANAEROBIC Blood Culture adequate volume   Culture   Final    NO GROWTH 4 DAYS Performed at [facility] [address] [place], [place] [address]    Report Status PENDING  Incomplete  Blood culture (routine x 2)     Status: None (Preliminary result)   Collection Time: 12/19/23  3:51 PM   Specimen: BLOOD  Result Value Ref Range Status   Specimen Description BLOOD RIGHT ANTECUBITAL  Final   Special Requests   Final    BOTTLES DRAWN AEROBIC AND ANAEROBIC Blood Culture adequate volume   Culture   Final    NO GROWTH 4 DAYS Performed at [facility], [address], [place], [place] [address]    Report Status PENDING  Incomplete  SARS Coronavirus 2 by RT PCR (hospital order, performed in [facility] hospital lab) *cepheid single result test* Anterior Nasal Swab     Status: None   Collection Time: 12/20/23  9:05 AM   Specimen: Anterior Nasal Swab  Result Value Ref Range Status   SARS Coronavirus 2 by RT PCR  NEGATIVE NEGATIVE Final    Comment: (NOTE) SARS-CoV-2 target nucleic acids are NOT DETECTED.  The SARS-CoV-2 RNA is generally detectable in upper and lower respiratory specimens during the acute phase of infection. The lowest concentration of SARS-CoV-2 viral copies this assay can detect is 250 copies / mL. A negative result does not preclude SARS-CoV-2 infection and should not be used as the sole basis for treatment or other patient management decisions.  A negative result may occur with improper specimen collection / handling, submission of specimen other than nasopharyngeal swab, presence of viral mutation(s) within the areas targeted by this assay, and inadequate number of viral copies (<250 copies / mL). A negative result must be combined with clinical observations, patient history, and epidemiological information.  Fact Sheet for Patients:   RoadLapTop.co.za  Fact Sheet for Healthcare Providers: http://kim-miller.com/  This test is not yet approved or  cleared by the [place]  FDA and has been authorized for detection and/or diagnosis of SARS-CoV-2 by FDA under an Emergency Use Authorization (EUA).  This EUA will remain in effect (meaning this test can be used) for the duration of the COVID-19 declaration under Section 564(b)(1) of the Act, 21 U.S.C. section 360bbb-3(b)(1), unless the authorization is terminated or revoked sooner.  Performed at [facility], [address] [place], [place] [address]   Respiratory (~20 pathogens) panel by PCR     Status: None   Collection Time: 12/20/23 10:00 AM   Specimen: Nasopharyngeal Swab; Respiratory  Result Value Ref Range Status   Adenovirus NOT DETECTED NOT DETECTED Final   Coronavirus 229E NOT DETECTED NOT DETECTED Final    Comment: (NOTE) The Coronavirus on the Respiratory Panel, DOES NOT test for the novel  Coronavirus (2019 nCoV)    Coronavirus HKU1 NOT DETECTED  NOT DETECTED Final   Coronavirus NL63 NOT DETECTED NOT DETECTED Final   Coronavirus OC43 NOT DETECTED NOT DETECTED Final   Metapneumovirus NOT DETECTED NOT DETECTED Final   Rhinovirus / Enterovirus NOT DETECTED NOT DETECTED Final   Influenza A NOT DETECTED NOT DETECTED Final   Influenza B NOT DETECTED NOT DETECTED Final   Parainfluenza Virus 1 NOT DETECTED NOT DETECTED Final   Parainfluenza Virus 2 NOT DETECTED NOT DETECTED Final   Parainfluenza Virus 3 NOT DETECTED NOT DETECTED Final   Parainfluenza Virus 4 NOT DETECTED NOT DETECTED Final   Respiratory Syncytial Virus  NOT DETECTED NOT DETECTED Final   Bordetella pertussis NOT DETECTED NOT DETECTED Final   Bordetella Parapertussis NOT DETECTED NOT DETECTED Final   Chlamydophila pneumoniae NOT DETECTED NOT DETECTED Final   Mycoplasma pneumoniae NOT DETECTED NOT DETECTED Final    Comment: Performed at [facility], [address], [place], [place] [address]         Radiology Studies: MR THORACIC SPINE WO CONTRAST Result Date: 12/22/2023 EXAM: MR Thoracic Spine without 12/22/2023 11:47:58 AM TECHNIQUE: Multiplanar multisequence MRI of the thoracic spine was performed without the administration of intravenous contrast. COMPARISON: MRI of the lumbar spine without contrast 12/21/2023 CLINICAL HISTORY: Abnormal MRI of the lumbar spine. Possible syrinx involving the partially visualized lower thoracic spinal cord. Finding is not entirely certain and may simply reflect averaging with the central canal. Follow-up examination with dedicated MRI of the thoracic spine suggested for further evaluation. FINDINGS: BONES/ALIGNMENT: There is normal alignment of the spine. The vertebral body heights are maintained. The bone marrow signal appears unremarkable. SPINAL CORD: The central canal is dilated up to 4.5 mm at the T9 level. Syrinx extends just over 3 cm from T8-9 to T9-10. No mass lesion is present. More modest dilation of the central canal is  present inferiorly to the level of T12. Slight focal dilation is present at the level of T6. Cord signal and morphology are otherwise normal. SOFT TISSUES: No paraspinal mass identified. DEGENERATIVE CHANGES: Left greater than right foraminal narrowing at C7-T1 is secondary to facet spurring. Mild disc bulging is present at T2-3 and T3-4 without significant stenosis. Mild disc bulging is present at T7-8 without significant stenosis. Facet hypertrophy contributes to mild foraminal narrowing at T9-10, right greater than left. Facet hypertrophy contributes to mild foraminal narrowing at T10-11, right greater than left. Rightward disc protrusion and asymmetric facet hypertrophy contribute to moderate right foraminal stenosis at T11-12. IMPRESSION: 1. 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 Maximum diameter of the syrinx is 4.5 mm at the T9 level. No mass lesion is present. Cord signal and morphology are otherwise normal. 2. Moderate right foraminal stenosis at T11-12 due to rightward disc protrusion and asymmetric facet hypertrophy. Electronically signed by: Audree Leas MD 12/22/2023 03:45 PM EDT RP Workstation: [id number]   MR LUMBAR SPINE WO CONTRAST Result Date: 12/22/2023 CLINICAL DATA:  Initial evaluation for acute lower extremity weakness. EXAM: MRI LUMBAR SPINE WITHOUT CONTRAST TECHNIQUE: Multiplanar, multisequence MR imaging of the lumbar spine was performed. No intravenous contrast was administered. COMPARISON:  None Available. FINDINGS: Segmentation: Standard. Lowest well-formed disc space labeled the L5-S1 level. Alignment: Moderate dextroscoliosis. 4 mm retrolisthesis of L5 on S1. Vertebrae: Vertebral body height maintained without acute or chronic fracture. Bone marrow signal intensity within normal limits. No worrisome osseous lesions. No abnormal marrow edema.  Conus medullaris and cauda equina: Conus extends to the L1 level. There is question of a small syrinx involving the partially visualized lower thoracic spinal cord (series 6, image 11). Finding is not entirely certain and may simply reflect averaging with the central canal. Conus otherwise within normal limits. Nerve roots of the cauda equina within normal limits. Paraspinal and other soft tissues: Paraspinous soft tissues demonstrate no acute finding. Disc levels: T12-L1: Degenerative intervertebral disc space narrowing with disc desiccation and diffuse disc bulge. Superimposed small central to left subarticular disc protrusion. Mild facet hypertrophy. No  spinal stenosis. Foramina remain patent. L1-2: Degenerative intervertebral disc space narrowing with diffuse disc bulge. Reactive endplate spurring. Mild bilateral facet hypertrophy. No spinal stenosis. Foramina remain patent. L2-3: Degenerative intervertebral disc space narrowing with diffuse disc bulge and disc desiccation. Reactive endplate spurring, worse on the left. Superimposed small central to left subarticular disc protrusion with annular fissure indents the ventral thecal sac (series 8, image 14). Mild facet hypertrophy. Mild narrowing of the left lateral recess. Central canal remains patent. No significant foraminal stenosis. L3-4: Degenerative intervertebral disc space narrowing with diffuse disc bulge and reactive endplate spurring. Mild to moderate facet hypertrophy. Resultant mild canal with left lateral recess stenosis. Mild to moderate left L3 foraminal narrowing. Right neural foramen remains patent. L4-5: Degenerate intervertebral disc space narrowing with diffuse disc bulge and disc desiccation. Reactive endplate change with marginal endplate osteophytic spurring. Moderate facet and ligament flavum hypertrophy. Mild epidural lipomatosis. Resultant severe spinal stenosis with the thecal sac measuring 4 mm in AP diameter. Mild bilateral L4  foraminal narrowing. L5-S1: Retrolisthesis with advanced degenerative intervertebral disc space narrowing. Diffuse disc bulge with reactive endplate spurring. Moderate bilateral facet hypertrophy. Epidural lipomatosis. No significant spinal stenosis. Moderate right with mild left L5 foraminal narrowing. IMPRESSION: 1. Multifactorial degenerative changes at L4-5 with resultant severe spinal stenosis. 2. Additional multilevel degenerative spondylosis and facet hypertrophy as above. No other high-grade spinal stenosis. Mild to moderate left L3 and right L5 foraminal narrowing as above. 3. Question small syrinx involving the partially visualized lower thoracic spinal cord. Finding is not entirely certain and may simply reflect averaging with the central canal. Follow-up examination with dedicated MRI of the thoracic spine suggested for further evaluation. Electronically Signed   By: Virgia Griffins M.D.   On: 12/22/2023 00:18        Scheduled Meds:  amLODipine   5 mg Oral Daily   aspirin EC  81 mg Oral Daily   budesonide-glycopyrrolate-formoterol   2 puff Inhalation BID   enoxaparin  (LOVENOX ) injection  50 mg Subcutaneous Q24H   feeding supplement  237 mL Oral BID BM   finasteride   5 mg Oral Daily   furosemide  20 mg Oral Daily   hydrochlorothiazide   25 mg Oral Daily   insulin  aspart  0-5 Units Subcutaneous QHS   insulin  aspart  0-9 Units Subcutaneous TID WC   latanoprost   1 drop Left Eye QHS   montelukast   10 mg Oral QHS   polyethylene glycol  17 g Oral Daily   rosuvastatin   20 mg Oral Daily   tamsulosin   0.4 mg Oral Daily   Continuous Infusions:     LOS: 2 days     Raymonde Calico, MD [facility]   If 7PM-7AM, please contact night-coverage [email or web address] Password [email or web address] 12/23/2023, 4:04 PM

## 2023-12-23 NOTE — Plan of Care (Signed)
  Problem: Coping: Goal: Ability to adjust to condition or change in health will improve Outcome: Progressing   Problem: Metabolic: Goal: Ability to maintain appropriate glucose levels will improve Outcome: Progressing   Problem: Nutritional: Goal: Maintenance of adequate nutrition will improve Outcome: Progressing   Problem: Clinical Measurements: Goal: Diagnostic test results will improve Outcome: Progressing   Problem: Activity: Goal: Risk for activity intolerance will decrease Outcome: Progressing   Problem: Pain Managment: Goal: General experience of comfort will improve and/or be controlled Outcome: Progressing   Problem: Safety: Goal: Ability to remain free from injury will improve Outcome: Progressing

## 2023-12-24 ENCOUNTER — Encounter: Payer: Self-pay | Admitting: Emergency Medicine

## 2023-12-24 DIAGNOSIS — R29898 Other symptoms and signs involving the musculoskeletal system: Secondary | ICD-10-CM | POA: Diagnosis not present

## 2023-12-24 LAB — CULTURE, BLOOD (ROUTINE X 2)
Culture  Setup Time: NONE SEEN
Culture: NO GROWTH
Special Requests: ADEQUATE
Special Requests: ADEQUATE

## 2023-12-24 LAB — BASIC METABOLIC PANEL WITH GFR
Anion gap: 10 (ref 5–15)
BUN: 39 mg/dL — ABNORMAL HIGH (ref 8–23)
CO2: 31 mmol/L (ref 22–32)
Calcium: 9.2 mg/dL (ref 8.9–10.3)
Chloride: 95 mmol/L — ABNORMAL LOW (ref 98–111)
Creatinine, Ser: 1.14 mg/dL (ref 0.61–1.24)
GFR, Estimated: >60 mL/min (ref >60)
Glucose, Bld: 162 mg/dL — ABNORMAL HIGH (ref 70–99)
Potassium: 3.9 mmol/L (ref 3.5–5.1)
Sodium: 136 mmol/L (ref 135–145)

## 2023-12-24 LAB — GLUCOSE, CAPILLARY
Glucose-Capillary: 147 mg/dL — ABNORMAL HIGH (ref 70–99)
Glucose-Capillary: 212 mg/dL — ABNORMAL HIGH (ref 70–99)
Glucose-Capillary: 162 mg/dL — ABNORMAL HIGH (ref 70–99)
Glucose-Capillary: 138 mg/dL — ABNORMAL HIGH (ref 70–99)

## 2023-12-24 NOTE — Progress Notes (Signed)
 PROGRESS NOTE    Cody Bridges  RUE:[id number] DOB: [birth year] DOA: 12/19/2023 PCP: Nadine Aus, NP     Brief Narrative:   From admission h and p  Cody Bridges is a 89 y.o. male presenting to the emergency department today following a near syncopal episode getting off the toilet at facility.  Patient reports his knees buckled and gave out when he tried to get up.  He reports associated dizziness and shortness of breath that started around the same time. Denies LOC or fall.  On EMS arrival, patient was apparently wheezing and was placed on 2 L of nasal cannula for saturations noted in the low 90%'s.  He received Solu-Medrol  en route.  By the time my exam, patient had been weaned back to room air and reported resolution of his respiratory symptoms. He was afebrile with white count of 11, hemodynamically stable.  Chemistry fairly unremarkable except for a slight increase in his creatinine to 1.4 from baseline about 1.1.  He reports the urge to void but states he is unable to.   Chest x-ray with low lung volumes and patchy medial right lower and left basilar opacities. CT head without acute intracranial abnormality was significant for advanced cerebral atrophy. Admission was requested for further management.  Assessment & Plan:   Principal Problem:   Lower extremity weakness Active Problems:   Hypertension   Chronic diastolic CHF (congestive heart failure) (HCC)   Diabetes mellitus without complication (HCC)   COPD (chronic obstructive pulmonary disease) (HCC)   Pneumonia   Spinal stenosis, lumbar region, with neurogenic claudication  # Lower extremity weakness # Lumbar stenosis Most likely progressive debility. Evidence prior stroke on mri, nothing acute. Ck wnl. MRI of lumbar spine shows stenosis, syrinx. Neurosurg has evaluated, chronic issues, nothing to do for the time being. Currently resides at assisted living facility.  - for SNF, can take him tomorrow  # Cough,  shortness of breath # COPD Unclear to what extent this is acute vs chronic. No o2 requirement. Basilar opacities could be infection. Procal is normal. Breathing comfortably , no cough. Respiratory panel neg - monitoring off abx/steroids, has been stable for several days - bretzri for trelegy    # Hx CVA On mri. Ldl 49 - started asa - continue home rosuvastatin   # T2DM Glucose appropriate, A1c 6.5% - SSI  # HTN Bp wnl - cont home amlodipine  - resumed home hydrochlorothiazide  - home spiro on hold  # AKI Resolved - monitor with resumption of home hctz  # BPH No s/s obstruction - cont home finasteride , flomax   # Cognitive impairment Appears to have at least MCI, possible dementia. Advanced atrophy seen on CT of brain. Tsh and b12 wnl  # Obesity noted   DVT prophylaxis: lovenox  Code Status: full Family Communication: daughter updated telephonically 5/24  Level of care: Med-Surg Status is: inpt    Consultants:  neurosurgery  Procedures: none  Antimicrobials:  See above    Subjective: Feeling fine, bm today, tolerating diet, no cough  Objective: Vitals:   12/23/23 1610 12/23/23 1953 12/24/23 0532 12/24/23 0727  BP: (!) 140/67 128/74 109/70 133/72  Pulse: (!) 103 100 87 79  Resp: 18 16 18 16   Temp: 97.7 F (36.5 C) 98.5 F (36.9 C) 97.8 F (36.6 C) (!) 97.5 F (36.4 C)  TempSrc: Oral Oral Oral Oral  SpO2: 100% 95% 99% 99%  Weight:      Height:        Intake/Output Summary (Last  24 hours) at 12/24/2023 1244 Last data filed at 12/24/2023 0130 Gross per 24 hour  Intake 300 ml  Output 100 ml  Net 200 ml   Filed Weights   12/19/23 1155  Weight: 95.9 kg    Examination:  General exam: Appears calm and comfortable  Respiratory system: normal wob, few scattered faint rhonchi otherwise clear Cardiovascular system: S1 & S2 heard, RRR.   Gastrointestinal system: Abdomen is obese, soft and nontender.   Central nervous system: Alert and oriented to  self, place Extremities: Symmetric 5 x 5 power. Skin: No rashes, lesions or ulcers Psychiatry: Judgement and insight appear normal. Mood & affect appropriate.     Data Reviewed: I have personally reviewed following labs and imaging studies  CBC: Recent Labs  Lab 12/19/23 1207 12/20/23 0340 12/21/23 0415  WBC 11.3* 15.1* 17.7*  HGB 13.0 12.0* 11.5*  HCT 40.9 36.7* 35.6*  MCV 96.5 95.3 96.0  PLT 351 328 325   Basic Metabolic Panel: Recent Labs  Lab 12/20/23 0340 12/21/23 0415 12/22/23 0142 12/23/23 0503 12/24/23 0600  NA 138 138 138 135 136  K 3.8 4.4 4.4 4.2 3.9  CL 103 103 103 90* 95*  CO2 24 27 28 30 31   GLUCOSE 185* 138* 154* 142* 162*  BUN 35* 43* 43* 40* 39*  CREATININE 1.03 1.02 1.08 1.12 1.14  CALCIUM  9.0 8.9 9.0 8.7* 9.2   GFR: Estimated Creatinine Clearance: 52.1 mL/min (by C-G formula based on SCr of 1.14 mg/dL). Liver Function Tests: No results for input(s): "AST", "ALT", "ALKPHOS", "BILITOT", "PROT", "ALBUMIN" in the last 168 hours. No results for input(s): "LIPASE", "AMYLASE" in the last 168 hours. No results for input(s): "AMMONIA" in the last 168 hours. Coagulation Profile: No results for input(s): "INR", "PROTIME" in the last 168 hours. Cardiac Enzymes: Recent Labs  Lab 12/20/23 1009  CKTOTAL 70   BNP (last 3 results) No results for input(s): "PROBNP" in the last 8760 hours. HbA1C: No results for input(s): "HGBA1C" in the last 72 hours.  CBG: Recent Labs  Lab 12/23/23 1710 12/23/23 1950 12/23/23 2155 12/24/23 0737 12/24/23 1136  GLUCAP 175* 212* 161* 147* 212*   Lipid Profile: Recent Labs    12/22/23 0142  CHOL 111  HDL 48  LDLCALC 49  TRIG 71  CHOLHDL 2.3   Thyroid  Function Tests: No results for input(s): "TSH", "T4TOTAL", "FREET4", "T3FREE", "THYROIDAB" in the last 72 hours.  Anemia Panel: No results for input(s): "VITAMINB12", "FOLATE", "FERRITIN", "TIBC", "IRON", "RETICCTPCT" in the last 72 hours.  Urine analysis:     Component Value Date/Time   COLORURINE YELLOW (A) 12/19/2023 1458   APPEARANCEUR HAZY (A) 12/19/2023 1458   APPEARANCEUR Clear 06/27/2022 1059   LABSPEC 1.019 12/19/2023 1458   PHURINE 5.0 12/19/2023 1458   GLUCOSEU NEGATIVE 12/19/2023 1458   GLUCOSEU NEGATIVE 08/12/2020 1605   HGBUR NEGATIVE 12/19/2023 1458   BILIRUBINUR NEGATIVE 12/19/2023 1458   BILIRUBINUR Negative 06/27/2022 1059   KETONESUR NEGATIVE 12/19/2023 1458   PROTEINUR 100 (A) 12/19/2023 1458   UROBILINOGEN 0.2 11/10/2020 1208   UROBILINOGEN 0.2 08/12/2020 1605   NITRITE NEGATIVE 12/19/2023 1458   LEUKOCYTESUR NEGATIVE 12/19/2023 1458   Sepsis Labs: @LABRCNTIP (procalcitonin:4,lacticidven:4)  ) Recent Results (from the past 240 hours)  Blood culture (routine x 2)     Status: None   Collection Time: 12/19/23  3:51 PM   Specimen: BLOOD  Result Value Ref Range Status   Specimen Description BLOOD BLOOD RIGHT FOREARM  Final   Special Requests  Final    BOTTLES DRAWN AEROBIC AND ANAEROBIC Blood Culture adequate volume   Culture   Final    NO GROWTH 5 DAYS Performed at [facility], [address] [place] [place], [place] [address]    Report Status 12/24/2023 FINAL  Final  Blood culture (routine x 2)     Status: None   Collection Time: 12/19/23  3:51 PM   Specimen: BLOOD  Result Value Ref Range Status   Specimen Description   Final    BLOOD RIGHT ANTECUBITAL Performed at [facility], [address] [place], [place] [address]    Special Requests   Final    BOTTLES DRAWN AEROBIC AND ANAEROBIC Blood Culture adequate volume Performed at [facility], [address], [place], [place] [address]    Culture  Setup Time   Final    NO ORGANISMS SEEN Performed at [facility], [address], [place], [place] [address]    Culture   Final    NO GROWTH 5 DAYS Performed at [facility], [address] [place], [place] [address]    Report Status 12/24/2023 FINAL  Final  SARS  Coronavirus 2 by RT PCR (hospital order, performed in [facility] hospital lab) *cepheid single result test* Anterior Nasal Swab     Status: None   Collection Time: 12/20/23  9:05 AM   Specimen: Anterior Nasal Swab  Result Value Ref Range Status   SARS Coronavirus 2 by RT PCR NEGATIVE NEGATIVE Final    Comment: (NOTE) SARS-CoV-2 target nucleic acids are NOT DETECTED.  The SARS-CoV-2 RNA is generally detectable in upper and lower respiratory specimens during the acute phase of infection. The lowest concentration of SARS-CoV-2 viral copies this assay can detect is 250 copies / mL. A negative result does not preclude SARS-CoV-2 infection and should not be used as the sole basis for treatment or other patient management decisions.  A negative result may occur with improper specimen collection / handling, submission of specimen other than nasopharyngeal swab, presence of viral mutation(s) within the areas targeted by this assay, and inadequate number of viral copies (<250 copies / mL). A negative result must be combined with clinical observations, patient history, and epidemiological information.  Fact Sheet for Patients:   RoadLapTop.co.za  Fact Sheet for Healthcare Providers: http://kim-miller.com/  This test is not yet approved or  cleared by the [place]  FDA and has been authorized for detection and/or diagnosis of SARS-CoV-2 by FDA under an Emergency Use Authorization (EUA).  This EUA will remain in effect (meaning this test can be used) for the duration of the COVID-19 declaration under Section 564(b)(1) of the Act, 21 U.S.C. section 360bbb-3(b)(1), unless the authorization is terminated or revoked sooner.  Performed at [facility], [address] [place], [place] [address]   Respiratory (~20 pathogens) panel by PCR     Status: None   Collection Time: 12/20/23 10:00 AM   Specimen: Nasopharyngeal Swab; Respiratory   Result Value Ref Range Status   Adenovirus NOT DETECTED NOT DETECTED Final   Coronavirus 229E NOT DETECTED NOT DETECTED Final    Comment: (NOTE) The Coronavirus on the Respiratory Panel, DOES NOT test for the novel  Coronavirus (2019 nCoV)    Coronavirus HKU1 NOT DETECTED NOT DETECTED Final   Coronavirus NL63 NOT DETECTED NOT DETECTED Final   Coronavirus OC43 NOT DETECTED NOT DETECTED Final   Metapneumovirus NOT DETECTED NOT DETECTED Final   Rhinovirus / Enterovirus NOT DETECTED NOT DETECTED Final  Influenza A NOT DETECTED NOT DETECTED Final   Influenza B NOT DETECTED NOT DETECTED Final   Parainfluenza Virus 1 NOT DETECTED NOT DETECTED Final   Parainfluenza Virus 2 NOT DETECTED NOT DETECTED Final   Parainfluenza Virus 3 NOT DETECTED NOT DETECTED Final   Parainfluenza Virus 4 NOT DETECTED NOT DETECTED Final   Respiratory Syncytial Virus NOT DETECTED NOT DETECTED Final   Bordetella pertussis NOT DETECTED NOT DETECTED Final   Bordetella Parapertussis NOT DETECTED NOT DETECTED Final   Chlamydophila pneumoniae NOT DETECTED NOT DETECTED Final   Mycoplasma pneumoniae NOT DETECTED NOT DETECTED Final    Comment: Performed at [facility], [address], [place], [place] [address]         Radiology Studies: No results found.       Scheduled Meds:  amLODipine   5 mg Oral Daily   aspirin EC  81 mg Oral Daily   budesonide-glycopyrrolate-formoterol   2 puff Inhalation BID   enoxaparin  (LOVENOX ) injection  50 mg Subcutaneous Q24H   feeding supplement  237 mL Oral BID BM   finasteride   5 mg Oral Daily   furosemide  20 mg Oral Daily   hydrochlorothiazide   25 mg Oral Daily   insulin  aspart  0-5 Units Subcutaneous QHS   insulin  aspart  0-9 Units Subcutaneous TID WC   latanoprost   1 drop Left Eye QHS   montelukast   10 mg Oral QHS   polyethylene glycol  17 g Oral Daily   rosuvastatin   20 mg Oral Daily   tamsulosin   0.4 mg Oral Daily   Continuous Infusions:     LOS: 3  days     Raymonde Calico, MD [facility]   If 7PM-7AM, please contact night-coverage [email or web address] Password [facility] 12/24/2023, 12:44 PM

## 2023-12-25 DIAGNOSIS — R29898 Other symptoms and signs involving the musculoskeletal system: Secondary | ICD-10-CM | POA: Diagnosis not present

## 2023-12-25 LAB — BASIC METABOLIC PANEL WITH GFR
Anion gap: 12 (ref 5–15)
BUN: 43 mg/dL — ABNORMAL HIGH (ref 8–23)
CO2: 28 mmol/L (ref 22–32)
Calcium: 9.1 mg/dL (ref 8.9–10.3)
Chloride: 93 mmol/L — ABNORMAL LOW (ref 98–111)
Creatinine, Ser: 1.26 mg/dL — ABNORMAL HIGH (ref 0.61–1.24)
GFR, Estimated: 55 mL/min — ABNORMAL LOW (ref >60)
Glucose, Bld: 151 mg/dL — ABNORMAL HIGH (ref 70–99)
Potassium: 3.7 mmol/L (ref 3.5–5.1)
Sodium: 133 mmol/L — ABNORMAL LOW (ref 135–145)

## 2023-12-25 LAB — GLUCOSE, CAPILLARY
Glucose-Capillary: 153 mg/dL — ABNORMAL HIGH (ref 70–99)
Glucose-Capillary: 154 mg/dL — ABNORMAL HIGH (ref 70–99)

## 2023-12-25 MED ORDER — ASPIRIN 81 MG PO TBEC: 81.0000 mg | DELAYED_RELEASE_TABLET | Freq: Every day | ORAL | Status: AC

## 2023-12-25 MED ORDER — AMLODIPINE BESYLATE 10 MG PO TABS
5.0000 mg | ORAL_TABLET | Freq: Every day | ORAL | Status: DC
Start: 2023-12-25 — End: 2024-01-30

## 2023-12-25 MED ORDER — MOMETASONE FURO-FORMOTEROL FUM 100-5 MCG/ACT IN AERO: 2.0000 | INHALATION_SPRAY | Freq: Two times a day (BID) | RESPIRATORY_TRACT | Status: AC | PRN

## 2023-12-25 NOTE — TOC Progression Note (Signed)
 Transition of Care [facility]) - Progression Note    Patient Details  Name: Cody Bridges MRN: [id number] Date of Birth: 13-Aug-1932  [facility]) CM/SW Contact  Crayton Docker, RN 12/25/2023, 10:57 AM  Clinical Narrative:      CM discussed case with Dr. Sari Cunning, regarding discharge care planning. Hospital day 6 with diagnosis of Lower extremity weakness. Per Dr. Sari Cunning, patient is medically ready to discharge to [facility] and Rehab. CM call to [facility] and Rehab. CM call placed to Admissions, [facility] and Rehab, Admissions and all Administrative Offices are closed for today and will reopen in the morning, 12/26/2023.  Expected Discharge Plan: Skilled Nursing Facility Barriers to Discharge: Continued Medical Work up  Expected Discharge Plan and Services     SNF     Social Determinants of Health (SDOH) Interventions SDOH Screenings   Food Insecurity: No Food Insecurity (12/19/2023)  Housing: Low Risk  (12/19/2023)  Transportation Needs: No Transportation Needs (12/19/2023)  Utilities: Not At Risk (12/19/2023)  Depression (PHQ2-9): Low Risk  (06/13/2022)  Social Connections: Moderately Integrated (12/19/2023)  Tobacco Use: Medium Risk (12/24/2023)    Readmission Risk Interventions     No data to display

## 2023-12-25 NOTE — Progress Notes (Signed)
 Patient discharged to [facility] place, report given to [facility] place all discharge education and teachings provided. All questions answered and belongings sent with patient. IV removed.

## 2023-12-25 NOTE — TOC Transition Note (Addendum)
 Transition of Care [facility]) - Discharge Note   Patient Details  Name: Cody Bridges MRN: [id number] Date of Birth: [birth year]  Transition of Care [facility]) CM/SW Contact:  Crayton Docker, RN 12/25/2023, 11:28 AM   Clinical Narrative:     Secure message received from Darrian, Admissions, [facility] and [facility]. Per Darrian, will accept patient back today. Patient to discharge to [facility] and Rehab. CM call to [employer], phone: [phone] regarding BLS transport scheduling. CM spoke to [place]. Per Ethyl Hering, BLS transport scheduled for 1300.   Discharge summary, discharge orders noted and faxed with SNF Transfer Report to [facility] and  Rehab. CM secure message to Darrian, Admissions of faxed discharge summary. Per Dene Fines, patient SNF room assignment is 101. CM alert to RN Kristyn regarding SNF room assignment.  Final next level of care: Skilled Nursing Facility Barriers to Discharge: No Barriers Identified   Patient Goals and CMS Choice    SNF  Discharge Placement       SNF          Discharge Plan and Services Additional resources added to the After Visit Summary for     Social Drivers of Health (SDOH) Interventions SDOH Screenings   Food Insecurity: No Food Insecurity (12/19/2023)  Housing: Low Risk  (12/19/2023)  Transportation Needs: No Transportation Needs (12/19/2023)  Utilities: Not At Risk (12/19/2023)  Depression (PHQ2-9): Low Risk  (06/13/2022)  Social Connections: Moderately Integrated (12/19/2023)  Tobacco Use: Medium Risk (12/24/2023)     Readmission Risk Interventions     No data to display

## 2023-12-25 NOTE — Discharge Summary (Addendum)
 Cody Bridges WUJ:[id number] DOB: [birth year] DOA: 12/19/2023  PCP: Nadine Aus, NP  Admit date: 12/19/2023 Discharge date: 12/25/2023  Time spent: 35 minutes  Recommendations for Outpatient Follow-up:  Pcp f/u, attention to bp and volume status at f/u (home diuretic held) Neurosurgery f/u     Discharge Diagnoses:  Principal Problem:   Lower extremity weakness Active Problems:   Hypertension   Chronic diastolic CHF (congestive heart failure) (HCC)   Diabetes mellitus without complication (HCC)   COPD (chronic obstructive pulmonary disease) (HCC)   Pneumonia   Spinal stenosis, lumbar region, with neurogenic claudication   Discharge Condition: stable  Diet recommendation: heart healthy  Filed Weights   12/19/23 1155  Weight: 95.9 kg    History of present illness:  From admission h and p Cody Bridges is a 89 y.o. male presenting to the emergency department today following a near syncopal episode getting off the toilet at facility.  Patient reports his knees buckled and gave out when he tried to get up.  He reports associated dizziness and shortness of breath that started around the same time. Denies LOC or fall.  On EMS arrival, patient was apparently wheezing and was placed on 2 L of nasal cannula for saturations noted in the low 90%'s.  He received Solu-Medrol  en route.  By the time my exam, patient had been weaned back to room air and reported resolution of his respiratory symptoms. He was afebrile with white count of 11, hemodynamically stable.  Chemistry fairly unremarkable except for a slight increase in his creatinine to 1.4 from baseline about 1.1.  He reports the urge to void but states he is unable to.   Chest x-ray with low lung volumes and patchy medial right lower and left basilar opacities. CT head without acute intracranial abnormality was significant for advanced cerebral atrophy. Admission was requested for further management.    Hospital Course:    # Lower extremity weakness # Lumbar stenosis Most likely progressive debility. Evidence prior stroke on mri, nothing acute. Ck wnl. MRI of lumbar spine shows stenosis, syrinx. Neurosurg has evaluated, chronic issues, nothing to do for the time being. Currently resides at assisted living facility.  - for SNF - consider outpatient neurosurgery (Dr. Mont Antis) f/u   # COPD StableNo o2 requirement. Basilar opacities could be infection. Procal is normal. Breathing comfortably, no cough. Respiratory panel neg - resume home inhalers  # Hx CVA On mri. Ldl 49 - started asa - continue home rosuvastatin    # T2DM Glucose appropriate, A1c 6.5% - resume home metformin    # HTN Bp wnl. Aki with diuretics - increase dose home amlodipine  - hold home diuretics, monitor volume status   # AKI Resolved with holding home diuretics   # BPH No s/s obstruction - cont home finasteride , flomax    # Cognitive impairment Appears to have at least MCI, probable dementia. Advanced atrophy seen on CT of brain. Tsh and b12 wnl   # Obesity noted  Procedures: none   Consultations: none  Discharge Exam: Vitals:   12/25/23 0247 12/25/23 0748  BP: 122/79 109/79  Pulse: 83 (!) 106  Resp: 16 17  Temp: 98 F (36.7 C) 97.7 F (36.5 C)  SpO2: 98% 94%    General exam: Appears calm and comfortable  Respiratory system: normal wob, few scattered faint rhonchi otherwise clear Cardiovascular system: S1 & S2 heard, RRR.   Gastrointestinal system: Abdomen is obese, soft and nontender.   Central nervous system: Alert and oriented to self,  place Extremities: Symmetric 5 x 5 power. Skin: No rashes, lesions or ulcers Psychiatry: confused, calm  Discharge Instructions   Discharge Instructions     Diet - low sodium heart healthy   Complete by: As directed    Increase activity slowly   Complete by: As directed       Allergies as of 12/25/2023       Reactions   Clindamycin Hives   Lisinopril  Cough   Penicillin V Potassium Rash, Other (See Comments)   Has patient had a PCN reaction causing immediate rash, facial/tongue/throat swelling, SOB or lightheadedness with hypotension: Unknown Has patient had a PCN reaction causing severe rash involving mucus membranes or skin necrosis: Unknown Has patient had a PCN reaction that required hospitalization: No Has patient had a PCN reaction occurring within the last 10 years: No If all of the above answers are "NO", then may proceed with Cephalosporin use.        Medication List     STOP taking these medications    furosemide 20 MG tablet Commonly known as: LASIX   spironolactone 25 MG tablet Commonly known as: ALDACTONE       TAKE these medications    Accu-Chek FastClix Lancets Misc Use up to Once daily as Directed  DX:  E11.9   Accu-Chek Guide test strip Generic drug: glucose blood USE UP TO ONCE DAILY AS DIRECTED DX: E11.9   acetaminophen  500 MG tablet Commonly known as: TYLENOL  Take 1,000 mg by mouth every 6 (six) hours as needed for mild pain or moderate pain.   albuterol  108 (90 Base) MCG/ACT inhaler Commonly known as: VENTOLIN  HFA Inhale 2 puffs into the lungs every 6 (six) hours as needed for wheezing or shortness of breath. What changed: when to take this   amLODipine  10 MG tablet Commonly known as: NORVASC  Take 0.5 tablets (5 mg total) by mouth daily. What changed: medication strength   aspirin EC 81 MG tablet Take 1 tablet (81 mg total) by mouth daily. Swallow whole. Start taking on: Dec 26, 2023   Azelastine  HCl 137 MCG/SPRAY Soln PLACE 2 SPRAYS INTO BOTH NOSTRILS 2 (TWO) TIMES DAILY. USE IN EACH NOSTRIL AS DIRECTED   blood glucose meter kit and supplies Kit Dispense based on patient and insurance preference. Use once daily as directed. (FOR ICD-10  E11.9).   calcium  carbonate 500 MG chewable tablet Commonly known as: TUMS - dosed in mg elemental calcium  Chew 1-2 tablets by mouth 3 (three) times  daily as needed (for acid reflux/indigestion.).   carboxymethylcellulose 0.5 % Soln Commonly known as: REFRESH PLUS Apply 1 drop to eye 3 (three) times daily as needed (dry eyes).   CeraVe Daily Moisturizing Lotn Apply 1 application topically daily as needed (dry skin).   dextromethorphan -guaiFENesin  30-600 MG 12hr tablet Commonly known as: MUCINEX  DM Take 1 tablet by mouth 2 (two) times daily as needed for cough.   Dupixent  300 MG/2ML prefilled syringe Generic drug: dupilumab  INJECT 1 SYRINGE UNDER THE SKIN EVERY OTHER WEEK   feeding supplement Liqd Take 237 mLs by mouth 2 (two) times daily between meals.   finasteride  5 MG tablet Commonly known as: PROSCAR  Take 1 tablet (5 mg total) by mouth daily.   ipratropium-albuterol  0.5-2.5 (3) MG/3ML Soln Commonly known as: DUONEB Take 3 mLs by nebulization every 4 (four) hours as needed.   latanoprost  0.005 % ophthalmic solution Commonly known as: XALATAN  Place 1 drop into the left eye at bedtime.   metFORMIN  500 MG tablet  Commonly known as: GLUCOPHAGE  TAKE 1 TABLET BY MOUTH TWICE A DAY   mometasone -formoterol  100-5 MCG/ACT Aero Commonly known as: DULERA  Inhale 2 puffs into the lungs every 12 (twelve) hours as needed for wheezing. What changed:  when to take this reasons to take this   montelukast  10 MG tablet Commonly known as: SINGULAIR  Take 1 tablet (10 mg total) by mouth at bedtime.   multivitamin with minerals Tabs tablet Take 1 tablet by mouth daily.   polyethylene glycol 17 g packet Commonly known as: MIRALAX  / GLYCOLAX  Take 17 g by mouth daily.   PRESERVISION AREDS PO Take 1 capsule by mouth in the morning and at bedtime.   rosuvastatin  20 MG tablet Commonly known as: CRESTOR  TAKE 1 TABLET BY MOUTH EVERY DAY   sodium chloride  0.65 % Soln nasal spray Commonly known as: OCEAN Place 1 spray into both nostrils as needed for congestion.   tamsulosin  0.4 MG Caps capsule Commonly known as: FLOMAX  Take 1  capsule (0.4 mg total) by mouth daily.   Trelegy Ellipta  100-62.5-25 MCG/ACT Aepb Generic drug: Fluticasone -Umeclidin-Vilant Inhale 1 puff into the lungs daily.   Vitamin D -3 125 MCG (5000 UT) Tabs Take 5,000 Units by mouth daily.       Allergies  Allergen Reactions   Clindamycin Hives   Lisinopril Cough   Penicillin V Potassium Rash and Other (See Comments)    Has patient had a PCN reaction causing immediate rash, facial/tongue/throat swelling, SOB or lightheadedness with hypotension: Unknown Has patient had a PCN reaction causing severe rash involving mucus membranes or skin necrosis: Unknown Has patient had a PCN reaction that required hospitalization: No Has patient had a PCN reaction occurring within the last 10 years: No If all of the above answers are "NO", then may proceed with Cephalosporin use.     Contact information for follow-up providers     Nadine Aus, NP Follow up.   Specialty: Nurse Practitioner Contact information: [address] [place] [address] [phone]         Jodeen Munch, MD Follow up.   Specialty: Neurosurgery Contact information: [address] [place] [address] [phone]              Contact information for after-discharge care     Destination     [facility] .   Service: Skilled Nursing Contact information: [address] [place]  [address] [phone]                      The results of significant diagnostics from this hospitalization (including imaging, microbiology, ancillary and laboratory) are listed below for reference.    Significant Diagnostic Studies: MR THORACIC SPINE WO CONTRAST Result Date: 12/22/2023 EXAM: MR Thoracic Spine without 12/22/2023 11:47:58 AM TECHNIQUE: Multiplanar multisequence MRI of the thoracic spine was performed without the administration of intravenous  contrast. COMPARISON: MRI of the lumbar spine without contrast 12/21/2023 CLINICAL HISTORY: Abnormal MRI of the lumbar spine. Possible syrinx involving the partially visualized lower thoracic spinal cord. Finding is not entirely certain and may simply reflect averaging with the central canal. Follow-up examination with dedicated MRI of the thoracic spine suggested for further evaluation. FINDINGS: BONES/ALIGNMENT: There is normal alignment of the spine. The vertebral body heights are maintained. The bone marrow signal appears unremarkable. SPINAL CORD: The central canal is dilated up to 4.5 mm at the T9 level. Syrinx extends just over 3 cm from  T8-9 to T9-10. No mass lesion is present. More modest dilation of the central canal is present inferiorly to the level of T12. Slight focal dilation is present at the level of T6. Cord signal and morphology are otherwise normal. SOFT TISSUES: No paraspinal mass identified. DEGENERATIVE CHANGES: Left greater than right foraminal narrowing at C7-T1 is secondary to facet spurring. Mild disc bulging is present at T2-3 and T3-4 without significant stenosis. Mild disc bulging is present at T7-8 without significant stenosis. Facet hypertrophy contributes to mild foraminal narrowing at T9-10, right greater than left. Facet hypertrophy contributes to mild foraminal narrowing at T10-11, right greater than left. Rightward disc protrusion and asymmetric facet hypertrophy contribute to moderate right foraminal stenosis at T11-12. IMPRESSION: 1. 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 Maximum diameter of the syrinx is 4.5 mm at the T9 level. No mass lesion is present. Cord signal and morphology are otherwise normal. 2. Moderate right foraminal stenosis at T11-12 due to rightward disc protrusion and asymmetric facet hypertrophy. Electronically signed by:  Audree Leas MD 12/22/2023 03:45 PM EDT RP Workstation: [id number]   MR LUMBAR SPINE WO CONTRAST Result Date: 12/22/2023 CLINICAL DATA:  Initial evaluation for acute lower extremity weakness. EXAM: MRI LUMBAR SPINE WITHOUT CONTRAST TECHNIQUE: Multiplanar, multisequence MR imaging of the lumbar spine was performed. No intravenous contrast was administered. COMPARISON:  None Available. FINDINGS: Segmentation: Standard. Lowest well-formed disc space labeled the L5-S1 level. Alignment: Moderate dextroscoliosis. 4 mm retrolisthesis of L5 on S1. Vertebrae: Vertebral body height maintained without acute or chronic fracture. Bone marrow signal intensity within normal limits. No worrisome osseous lesions. No abnormal marrow edema. Conus medullaris and cauda equina: Conus extends to the L1 level. There is question of a small syrinx involving the partially visualized lower thoracic spinal cord (series 6, image 11). Finding is not entirely certain and may simply reflect averaging with the central canal. Conus otherwise within normal limits. Nerve roots of the cauda equina within normal limits. Paraspinal and other soft tissues: Paraspinous soft tissues demonstrate no acute finding. Disc levels: T12-L1: Degenerative intervertebral disc space narrowing with disc desiccation and diffuse disc bulge. Superimposed small central to left subarticular disc protrusion. Mild facet hypertrophy. No spinal stenosis. Foramina remain patent. L1-2: Degenerative intervertebral disc space narrowing with diffuse disc bulge. Reactive endplate spurring. Mild bilateral facet hypertrophy. No spinal stenosis. Foramina remain patent. L2-3: Degenerative intervertebral disc space narrowing with diffuse disc bulge and disc desiccation. Reactive endplate spurring, worse on the left. Superimposed small central to left subarticular disc protrusion with annular fissure indents the ventral thecal sac (series 8, image 14). Mild facet hypertrophy. Mild  narrowing of the left lateral recess. Central canal remains patent. No significant foraminal stenosis. L3-4: Degenerative intervertebral disc space narrowing with diffuse disc bulge and reactive endplate spurring. Mild to moderate facet hypertrophy. Resultant mild canal with left lateral recess stenosis. Mild to moderate left L3 foraminal narrowing. Right neural foramen remains patent. L4-5: Degenerate intervertebral disc space narrowing with diffuse disc bulge and disc desiccation. Reactive endplate change with marginal endplate osteophytic spurring. Moderate facet and ligament flavum hypertrophy. Mild epidural lipomatosis. Resultant severe spinal stenosis with the thecal sac measuring 4 mm in AP diameter. Mild bilateral L4 foraminal narrowing. L5-S1: Retrolisthesis with advanced degenerative intervertebral disc space narrowing. Diffuse disc bulge with reactive endplate spurring. Moderate bilateral facet hypertrophy. Epidural lipomatosis. No  significant spinal stenosis. Moderate right with mild left L5 foraminal narrowing. IMPRESSION: 1. Multifactorial degenerative changes at L4-5 with resultant severe spinal stenosis. 2. Additional multilevel degenerative spondylosis and facet hypertrophy as above. No other high-grade spinal stenosis. Mild to moderate left L3 and right L5 foraminal narrowing as above. 3. Question small syrinx involving the partially visualized lower thoracic spinal cord. Finding is not entirely certain and may simply reflect averaging with the central canal. Follow-up examination with dedicated MRI of the thoracic spine suggested for further evaluation. Electronically Signed   By: Virgia Griffins M.D.   On: 12/22/2023 00:18   MR BRAIN WO CONTRAST Result Date: 12/21/2023 CLINICAL DATA:  Initial evaluation for lower extremity weakness. EXAM: MRI HEAD WITHOUT CONTRAST TECHNIQUE: Multiplanar, multiecho pulse sequences of the brain and surrounding structures were obtained without intravenous  contrast. COMPARISON:  CT from 12/19/2023 FINDINGS: Brain: Diffuse prominence of the CSF containing spaces compatible with cerebral atrophy, fairly advanced in nature. Few small remote lacunar infarcts present about the left basal ganglia/corona radiata. Small remote cortical infarct at the left occipital lobe (series 11, image 24). No evidence for acute or subacute infarct. Gray-white matter differentiation maintained. No acute or significant chronic intracranial blood products. No mass lesion, midline shift or mass effect. Ventricular prominence related to global parenchymal volume loss of hydrocephalus. No extra-axial fluid collection. Pituitary gland and suprasellar region within normal limits. Vascular: Major intracranial vascular flow voids are maintained. Skull and upper cervical spine: Cranial junction within normal limits. Bone marrow signal intensity normal. No scalp soft tissue abnormality. Sinuses/Orbits: Prior bilateral ocular lens replacement. Paranasal sinuses are largely clear. Small right mastoid effusion noted, of doubtful significance. Image nasopharynx unremarkable. Other: None. IMPRESSION: 1. No acute intracranial abnormality. 2. Advanced cerebral atrophy with a few small remote lacunar infarcts about the left basal ganglia/corona radiata. Small remote left occipital infarct. Electronically Signed   By: Virgia Griffins M.D.   On: 12/21/2023 03:28   DG Abd 1 View Result Date: 12/20/2023 CLINICAL DATA:  [id number] Foreign body GU tract [id number] EXAM: ABDOMEN - 1 VIEW COMPARISON:  None Available. FINDINGS: The bowel gas pattern is normal. Stool within the rectum. No radio-opaque calculi or other significant radiographic abnormality are seen. Degenerative changes visualized lower lumbar spine. Atherosclerotic plaque. IMPRESSION: 1. Nonobstructive bowel gas pattern. 2. No retained radiopaque foreign body. Electronically Signed   By: Morgane  Naveau M.D.   On: 12/20/2023 17:35   CT Head Wo  Contrast Result Date: 12/19/2023 CLINICAL DATA:  Weakness.  Near syncope. EXAM: CT HEAD WITHOUT CONTRAST TECHNIQUE: Contiguous axial images were obtained from the base of the skull through the vertex without intravenous contrast. RADIATION DOSE REDUCTION: This exam was performed according to the departmental dose-optimization program which includes automated exposure control, adjustment of the mA and/or kV according to patient size and/or use of iterative reconstruction technique. COMPARISON:  None Available. FINDINGS: Brain: There is no evidence of an acute infarct, acute intracranial hemorrhage, mass, or midline shift. There is a chronic infarct in the left basal ganglia and corona radiata. There is advanced cerebral atrophy with ventricular and sulcal enlargement. Mildly prominent extra-axial CSF over the frontal convexities measures up to 8 mm in thickness on the right and 6 mm on the left and may be secondary to atrophy or reflect small subdural hygromas without significant mass effect. Vascular: No hyperdense vessel. Skull: No acute fracture or suspicious lesion. Sinuses/Orbits: Visualized paranasal sinuses and mastoid air cells are clear. Bilateral cataract extraction. Other: None. IMPRESSION: 1.  No evidence of acute intracranial abnormality. 2. Chronic left basal ganglia infarct. 3. Advanced cerebral atrophy. Electronically Signed   By: Aundra Lee M.D.   On: 12/19/2023 16:01   DG Chest Port 1 View Result Date: 12/19/2023 CLINICAL DATA:  Shortness of breath and near syncopal episode chest EXAM: PORTABLE CHEST 1 VIEW COMPARISON:  Radiograph dated 11/02/2022 FINDINGS: Low lung volumes with bronchovascular crowding. Patchy medial right lower and left basilar opacities. No pleural effusion or pneumothorax. The heart size and mediastinal contours are within normal limits. No acute osseous abnormality. IMPRESSION: Low lung volumes with bronchovascular crowding. Patchy medial right lower and left basilar  opacities, which may represent atelectasis, aspiration, or pneumonia. Electronically Signed   By: Limin  Xu M.D.   On: 12/19/2023 15:10    Microbiology: Recent Results (from the past 240 hours)  Blood culture (routine x 2)     Status: None   Collection Time: 12/19/23  3:51 PM   Specimen: BLOOD  Result Value Ref Range Status   Specimen Description BLOOD BLOOD RIGHT FOREARM  Final   Special Requests   Final    BOTTLES DRAWN AEROBIC AND ANAEROBIC Blood Culture adequate volume   Culture   Final    NO GROWTH 5 DAYS Performed at [facility], [address], [place], [place] [address]    Report Status 12/24/2023 FINAL  Final  Blood culture (routine x 2)     Status: None   Collection Time: 12/19/23  3:51 PM   Specimen: BLOOD  Result Value Ref Range Status   Specimen Description   Final    BLOOD RIGHT ANTECUBITAL Performed at [facility], [address] [place], [place] [address]    Special Requests   Final    BOTTLES DRAWN AEROBIC AND ANAEROBIC Blood Culture adequate volume Performed at [facility], [address] [place], [place] [address]    Culture  Setup Time   Final    NO ORGANISMS SEEN Performed at [facility], [address], [place], [place] [address]    Culture   Final    NO GROWTH 5 DAYS Performed at [facility], [address] [place], [place] [address]    Report Status 12/24/2023 FINAL  Final  SARS Coronavirus 2 by RT PCR (hospital order, performed in [facility] hospital lab) *cepheid single result test* Anterior Nasal Swab     Status: None   Collection Time: 12/20/23  9:05 AM   Specimen: Anterior Nasal Swab  Result Value Ref Range Status   SARS Coronavirus 2 by RT PCR NEGATIVE NEGATIVE Final    Comment: (NOTE) SARS-CoV-2 target nucleic acids are NOT DETECTED.  The SARS-CoV-2 RNA is generally detectable in upper and lower respiratory specimens during the acute phase of infection. The lowest concentration of  SARS-CoV-2 viral copies this assay can detect is 250 copies / mL. A negative result does not preclude SARS-CoV-2 infection and should not be used as the sole basis for treatment or other patient management decisions.  A negative result may occur with improper specimen collection / handling, submission of specimen other than nasopharyngeal swab, presence of viral mutation(s) within the areas targeted by this assay, and inadequate number of viral copies (<250 copies / mL). A negative result must be combined with clinical observations, patient history, and epidemiological information.  Fact Sheet for Patients:   RoadLapTop.co.za  Fact Sheet for Healthcare Providers: http://kim-miller.com/  This test is not yet approved or  cleared by the [place] FDA and has been authorized for detection and/or diagnosis of SARS-CoV-2 by FDA under an Emergency Use Authorization (EUA).  This EUA will remain in effect (meaning this test can be used) for the duration of the COVID-19 declaration under Section 564(b)(1) of the Act, 21 U.S.C. section 360bbb-3(b)(1), unless the authorization is terminated or revoked sooner.  Performed at [facility], [address] [place], [place] [address]   Respiratory (~20 pathogens) panel by PCR     Status: None   Collection Time: 12/20/23 10:00 AM   Specimen: Nasopharyngeal Swab; Respiratory  Result Value Ref Range Status   Adenovirus NOT DETECTED NOT DETECTED Final   Coronavirus 229E NOT DETECTED NOT DETECTED Final    Comment: (NOTE) The Coronavirus on the Respiratory Panel, DOES NOT test for the novel  Coronavirus (2019 nCoV)    Coronavirus HKU1 NOT DETECTED NOT DETECTED Final   Coronavirus NL63 NOT DETECTED NOT DETECTED Final   Coronavirus OC43 NOT DETECTED NOT DETECTED Final   Metapneumovirus NOT DETECTED NOT DETECTED Final   Rhinovirus / Enterovirus NOT DETECTED NOT DETECTED Final   Influenza A  NOT DETECTED NOT DETECTED Final   Influenza B NOT DETECTED NOT DETECTED Final   Parainfluenza Virus 1 NOT DETECTED NOT DETECTED Final   Parainfluenza Virus 2 NOT DETECTED NOT DETECTED Final   Parainfluenza Virus 3 NOT DETECTED NOT DETECTED Final   Parainfluenza Virus 4 NOT DETECTED NOT DETECTED Final   Respiratory Syncytial Virus NOT DETECTED NOT DETECTED Final   Bordetella pertussis NOT DETECTED NOT DETECTED Final   Bordetella Parapertussis NOT DETECTED NOT DETECTED Final   Chlamydophila pneumoniae NOT DETECTED NOT DETECTED Final   Mycoplasma pneumoniae NOT DETECTED NOT DETECTED Final    Comment: Performed at [facility], [address], [place], [place] [address]     Labs: Basic Metabolic Panel: Recent Labs  Lab 12/21/23 0415 12/22/23 0142 12/23/23 0503 12/24/23 0600 12/25/23 0303  NA 138 138 135 136 133*  K 4.4 4.4 4.2 3.9 3.7  CL 103 103 90* 95* 93*  CO2 27 28 30 31 28   GLUCOSE 138* 154* 142* 162* 151*  BUN 43* 43* 40* 39* 43*  CREATININE 1.02 1.08 1.12 1.14 1.26*  CALCIUM  8.9 9.0 8.7* 9.2 9.1   Liver Function Tests: No results for input(s): "AST", "ALT", "ALKPHOS", "BILITOT", "PROT", "ALBUMIN" in the last 168 hours. No results for input(s): "LIPASE", "AMYLASE" in the last 168 hours. No results for input(s): "AMMONIA" in the last 168 hours. CBC: Recent Labs  Lab 12/19/23 1207 12/20/23 0340 12/21/23 0415  WBC 11.3* 15.1* 17.7*  HGB 13.0 12.0* 11.5*  HCT 40.9 36.7* 35.6*  MCV 96.5 95.3 96.0  PLT 351 328 325   Cardiac Enzymes: Recent Labs  Lab 12/20/23 1009  CKTOTAL 70   BNP: BNP (last 3 results) No results for input(s): "BNP" in the last 8760 hours.  ProBNP (last 3 results) No results for input(s): "PROBNP" in the last 8760 hours.  CBG: Recent Labs  Lab 12/24/23 1136 12/24/23 1638 12/24/23 2126 12/25/23 0745 12/25/23 1125  GLUCAP 212* 162* 138* 153* 154*       Signed:  Raymonde Calico MD.  [facility] 12/25/2023, 11:32 AM

## 2024-01-02 ENCOUNTER — Ambulatory Visit (INDEPENDENT_AMBULATORY_CARE_PROVIDER_SITE_OTHER)

## 2024-01-02 ENCOUNTER — Ambulatory Visit: Admitting: Neurosurgery

## 2024-01-02 DIAGNOSIS — L209 Atopic dermatitis, unspecified: Secondary | ICD-10-CM | POA: Diagnosis not present

## 2024-01-02 NOTE — Progress Notes (Signed)
 Patient here today for two week Dupixent  injection for severe atopic dermatitis.    Dupixent  300mg /39mL injected into left upper arm. Patient tolerated well.    LOT: [id number] EXP: 10/2025 NDC: 0454-0981-19   Lisbeth Rides, RMA

## 2024-01-09 NOTE — Progress Notes (Deleted)
 Referring Physician:  Merilee Setter, NP [phone] [place],  [place] [address]  Primary Physician:  Merilee Setter, NP  History of Present Illness: 01/10/2024*** Cody Bridges has a history of COPD, history of CVA, DM, HTN, BPH, probable dementia, obesity.   Seen as hospital consult by Dr. Clois on 12/23/23 for lumbar stenosis. He recommended PT and outpatient f/u.    Low back pain?  Leg weakness  He does not smoke.   Bowel/Bladder Dysfunction: none  Conservative measures:  Physical therapy: has not participated in PT  Multimodal medical therapy including regular antiinflammatories: Tylenol   Injections: no epidural steroid injections  Past Surgery: none  Cody Bridges has ***no symptoms of cervical myelopathy.  The symptoms are causing a significant impact on the patient's life.   Review of Systems:  A 10 point review of systems is negative, except for the pertinent positives and negatives detailed in the HPI.  Past Medical History: Past Medical History:  Diagnosis Date   Actinic keratosis    Arthritis    Asthma    COPD (chronic obstructive pulmonary disease) (HCC)    COVID-19 02/13/2021   Diabetes mellitus without complication (HCC)     2015   Dyspnea    with exertion   Edema    GERD (gastroesophageal reflux disease)    HOH (hard of hearing)    AIDS   Hyperlipidemia    Hypertension    Wheezing     Past Surgical History: Past Surgical History:  Procedure Laterality Date   BUNIONECTOMY Right    CATARACT EXTRACTION W/PHACO Left 08/22/2017   Procedure: CATARACT EXTRACTION PHACO AND INTRAOCULAR LENS PLACEMENT (IOC);  Surgeon: Jaye Fallow, MD;  Location: [facility] ORS;  Service: Ophthalmology;  Laterality: Left;  US  00:43.7 AP% 12.3 CDE 5.38 Fluid pack lot # [id number]   COLONOSCOPY  2016   ELECTROMAGNETIC NAVIGATION BROCHOSCOPY N/A 02/09/2017   Procedure: ELECTROMAGNETIC NAVIGATION BRONCHOSCOPY;  Surgeon: Isaiah Scrivener, MD;   Location: [facility] ORS;  Service: Cardiopulmonary;  Laterality: N/A;   EYE SURGERY Right    Cataract Extraction with IOL   HAMMER TOE SURGERY Right    TONSILLECTOMY  1938    Allergies: Allergies as of 01/11/2024 - Review Complete 12/19/2023  Allergen Reaction Noted   Clindamycin Hives 02/05/2014   Lisinopril Cough 09/04/2018   Penicillin v potassium Rash and Other (See Comments) 02/20/2014    Medications: Outpatient Encounter Medications as of 01/11/2024  Medication Sig   Accu-Chek FastClix Lancets MISC Use up to Once daily as Directed  DX:  E11.9   ACCU-CHEK GUIDE test strip USE UP TO ONCE DAILY AS DIRECTED DX: E11.9   acetaminophen  (TYLENOL ) 500 MG tablet Take 1,000 mg by mouth every 6 (six) hours as needed for mild pain or moderate pain.   albuterol  (VENTOLIN  HFA) 108 (90 Base) MCG/ACT inhaler Inhale 2 puffs into the lungs every 6 (six) hours as needed for wheezing or shortness of breath. (Patient taking differently: Inhale 2 puffs into the lungs every 12 (twelve) hours.)   amLODipine  (NORVASC ) 10 MG tablet Take 0.5 tablets (5 mg total) by mouth daily.   aspirin  EC 81 MG tablet Take 1 tablet (81 mg total) by mouth daily. Swallow whole.   Azelastine  HCl 137 MCG/SPRAY SOLN PLACE 2 SPRAYS INTO BOTH NOSTRILS 2 (TWO) TIMES DAILY. USE IN EACH NOSTRIL AS DIRECTED   blood glucose meter kit and supplies KIT Dispense based on patient and insurance preference. Use once daily as directed. (FOR ICD-10  E11.9).   calcium  carbonate (TUMS - DOSED IN MG ELEMENTAL CALCIUM ) 500 MG chewable tablet Chew 1-2 tablets by mouth 3 (three) times daily as needed (for acid reflux/indigestion.).   carboxymethylcellulose (REFRESH PLUS) 0.5 % SOLN Apply 1 drop to eye 3 (three) times daily as needed (dry eyes).   Cholecalciferol  (VITAMIN D -3) 5000 units TABS Take 5,000 Units by mouth daily.    dextromethorphan -guaiFENesin  (MUCINEX  DM) 30-600 MG 12hr tablet Take 1 tablet by mouth 2 (two) times daily as needed for cough.    DUPIXENT  300 MG/2ML prefilled syringe INJECT 1 SYRINGE UNDER THE SKIN EVERY OTHER WEEK   Emollient (CERAVE DAILY MOISTURIZING) LOTN Apply 1 application topically daily as needed (dry skin).   feeding supplement (ENSURE ENLIVE / ENSURE PLUS) LIQD Take 237 mLs by mouth 2 (two) times daily between meals.   finasteride  (PROSCAR ) 5 MG tablet Take 1 tablet (5 mg total) by mouth daily.   Fluticasone -Umeclidin-Vilant (TRELEGY ELLIPTA ) 100-62.5-25 MCG/ACT AEPB Inhale 1 puff into the lungs daily.   ipratropium-albuterol  (DUONEB) 0.5-2.5 (3) MG/3ML SOLN Take 3 mLs by nebulization every 4 (four) hours as needed.   latanoprost  (XALATAN ) 0.005 % ophthalmic solution Place 1 drop into the left eye at bedtime.   metFORMIN  (GLUCOPHAGE ) 500 MG tablet TAKE 1 TABLET BY MOUTH TWICE A DAY   mometasone -formoterol  (DULERA ) 100-5 MCG/ACT AERO Inhale 2 puffs into the lungs every 12 (twelve) hours as needed for wheezing.   montelukast  (SINGULAIR ) 10 MG tablet Take 1 tablet (10 mg total) by mouth at bedtime.   Multiple Vitamin (MULTIVITAMIN WITH MINERALS) TABS tablet Take 1 tablet by mouth daily.   Multiple Vitamins-Minerals (PRESERVISION AREDS PO) Take 1 capsule by mouth in the morning and at bedtime.   polyethylene glycol (MIRALAX  / GLYCOLAX ) 17 g packet Take 17 g by mouth daily.   rosuvastatin  (CRESTOR ) 20 MG tablet TAKE 1 TABLET BY MOUTH EVERY DAY   sodium chloride  (OCEAN) 0.65 % SOLN nasal spray Place 1 spray into both nostrils as needed for congestion.   tamsulosin  (FLOMAX ) 0.4 MG CAPS capsule Take 1 capsule (0.4 mg total) by mouth daily.   Facility-Administered Encounter Medications as of 01/11/2024  Medication   dupilumab  (DUPIXENT ) prefilled syringe 300 mg    Social History: Social History   Tobacco Use   Smoking status: Former    Current packs/day: 0.00    Average packs/day: 2.0 packs/day for 35.0 years (70.0 ttl pk-yrs)    Types: Cigarettes    Start date: 10/31/1950    Quit date: 10/30/1985    Years since  quitting: 38.2   Smokeless tobacco: Never  Vaping Use   Vaping status: Never Used  Substance Use Topics   Alcohol  use: Yes    Comment: occasional   Drug use: No    Family Medical History: Family History  Problem Relation Age of Onset   Cancer Mother    Stomach cancer Mother    Stomach cancer Sister     Physical Examination: There were no vitals filed for this visit.  General: Patient is well developed, well nourished, calm, collected, and in no apparent distress. Attention to examination is appropriate.  Respiratory: Patient is breathing without any difficulty.   NEUROLOGICAL:     Awake, alert, oriented to person, place, and time.  Speech is clear and fluent. Fund of knowledge is appropriate.   Cranial Nerves: Pupils equal round and reactive to light.  Facial tone is symmetric.    *** ROM of cervical spine *** pain *** posterior cervical  tenderness. *** tenderness in bilateral trapezial region.   *** ROM of lumbar spine *** pain *** posterior lumbar tenderness.   No abnormal lesions on exposed skin.   Strength: Side Biceps Triceps Deltoid Interossei Grip Wrist Ext. Wrist Flex.  R 5 5 5 5 5 5 5   L 5 5 5 5 5 5 5    Side Iliopsoas Quads Hamstring PF DF EHL  R 5 5 5 5 5 5   L 5 5 5 5 5 5    Reflexes are ***2+ and symmetric at the biceps, brachioradialis, patella and achilles.   Hoffman's is absent.  Clonus is not present.   Bilateral upper and lower extremity sensation is intact to light touch.     Gait is normal.   ***No difficulty with tandem gait.    Medical Decision Making  Imaging: MRI lumbar spine dated 12/21/23:  FINDINGS: Segmentation: Standard. Lowest well-formed disc space labeled the L5-S1 level.   Alignment: Moderate dextroscoliosis. 4 mm retrolisthesis of L5 on S1.   Vertebrae: Vertebral body height maintained without acute or chronic fracture. Bone marrow signal intensity within normal limits. No worrisome osseous lesions. No abnormal marrow  edema.   Conus medullaris and cauda equina: Conus extends to the L1 level. There is question of a small syrinx involving the partially visualized lower thoracic spinal cord (series 6, image 11). Finding is not entirely certain and may simply reflect averaging with the central canal. Conus otherwise within normal limits. Nerve roots of the cauda equina within normal limits.   Paraspinal and other soft tissues: Paraspinous soft tissues demonstrate no acute finding.   Disc levels:   T12-L1: Degenerative intervertebral disc space narrowing with disc desiccation and diffuse disc bulge. Superimposed small central to left subarticular disc protrusion. Mild facet hypertrophy. No spinal stenosis. Foramina remain patent.   L1-2: Degenerative intervertebral disc space narrowing with diffuse disc bulge. Reactive endplate spurring. Mild bilateral facet hypertrophy. No spinal stenosis. Foramina remain patent.   L2-3: Degenerative intervertebral disc space narrowing with diffuse disc bulge and disc desiccation. Reactive endplate spurring, worse on the left. Superimposed small central to left subarticular disc protrusion with annular fissure indents the ventral thecal sac (series 8, image 14). Mild facet hypertrophy. Mild narrowing of the left lateral recess. Central canal remains patent. No significant foraminal stenosis.   L3-4: Degenerative intervertebral disc space narrowing with diffuse disc bulge and reactive endplate spurring. Mild to moderate facet hypertrophy. Resultant mild canal with left lateral recess stenosis. Mild to moderate left L3 foraminal narrowing. Right neural foramen remains patent.   L4-5: Degenerate intervertebral disc space narrowing with diffuse disc bulge and disc desiccation. Reactive endplate change with marginal endplate osteophytic spurring. Moderate facet and ligament flavum hypertrophy. Mild epidural lipomatosis. Resultant severe spinal stenosis with the  thecal sac measuring 4 mm in AP diameter. Mild bilateral L4 foraminal narrowing.   L5-S1: Retrolisthesis with advanced degenerative intervertebral disc space narrowing. Diffuse disc bulge with reactive endplate spurring. Moderate bilateral facet hypertrophy. Epidural lipomatosis. No significant spinal stenosis. Moderate right with mild left L5 foraminal narrowing.   IMPRESSION: 1. Multifactorial degenerative changes at L4-5 with resultant severe spinal stenosis. 2. Additional multilevel degenerative spondylosis and facet hypertrophy as above. No other high-grade spinal stenosis. Mild to moderate left L3 and right L5 foraminal narrowing as above. 3. Question small syrinx involving the partially visualized lower thoracic spinal cord. Finding is not entirely certain and may simply reflect averaging with the central canal. Follow-up examination with dedicated MRI of the thoracic spine  suggested for further evaluation.     Electronically Signed   By: Morene Hoard M.D.   On: 12/22/2023 00:18   Thoracic MRI dated 12/22/23:  FINDINGS:   BONES/ALIGNMENT: There is normal alignment of the spine. The vertebral body heights are maintained. The bone marrow signal appears unremarkable.   SPINAL CORD: The central canal is dilated up to 4.5 mm at the T9 level. Syrinx extends just over 3 cm from T8-9 to T9-10. No mass lesion is present. More modest dilation of the central canal is present inferiorly to the level of T12. Slight focal dilation is present at the level of T6. Cord signal and morphology are otherwise normal.   SOFT TISSUES: No paraspinal mass identified.   DEGENERATIVE CHANGES: Left greater than right foraminal narrowing at C7-T1 is secondary to facet spurring. Mild disc bulging is present at T2-3 and T3-4 without significant stenosis. Mild disc bulging is present at T7-8 without significant stenosis. Facet hypertrophy contributes to mild foraminal narrowing at T9-10,  right greater than left. Facet hypertrophy contributes to mild foraminal narrowing at T10-11, right greater than left. Rightward disc protrusion and asymmetric facet hypertrophy contribute to moderate right foraminal stenosis at T11-12.   IMPRESSION: 1. 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 Maximum diameter of the syrinx is 4.5 mm at the T9 level. No mass lesion is present. Cord signal and morphology are otherwise normal. 2. Moderate right foraminal stenosis at T11-12 due to rightward disc protrusion and asymmetric facet hypertrophy.   Electronically signed by: Lonni Necessary MD 12/22/2023 03:45 PM EDT RP Workstation: [id number]  I have personally reviewed the images and agree with the above interpretation.  Assessment and Plan: Mr. Brosh is a pleasant 89 y.o. male has ***  Treatment options discussed with patient and following plan made:   - Order for physical therapy for *** spine ***. Patient to call to schedule appointment. *** - Continue current medications including ***. Reviewed dosing and side effects.  - Prescription for ***. Reviewed dosing and side effects. Take with food.  - Prescription for *** to take prn muscle spasms. Reviewed dosing and side effects. Discussed this can cause drowsiness.  - MRI of *** to further evaluate *** radiculopathy. No improvement time or medications (***).  - Referral to PMR at [facility] to discuss possible *** injections.  - Will schedule phone visit to review MRI results once I get them back.   I spent a total of *** minutes in face-to-face and non-face-to-face activities related to this patient's care today including review of outside records, review of imaging, review of symptoms, physical exam, discussion of differential diagnosis, discussion of treatment options, and documentation.   Thank you for  involving me in the care of this patient.   Glade Boys PA-C Dept. of Neurosurgery

## 2024-01-11 ENCOUNTER — Ambulatory Visit: Admitting: Orthopedic Surgery

## 2024-01-18 ENCOUNTER — Ambulatory Visit: Admitting: Orthopedic Surgery

## 2024-01-19 NOTE — Progress Notes (Signed)
 Referring Physician:  Merilee Setter, NP [phone] [place],  [place] [address]  Primary Physician:  Merilee Setter, NP  History of Present Illness: 01/30/2024 [name] has a history of COPD, history of CVA, DM, HTN, BPH, probable dementia, obesity.   His daughter Cody Bridges is his legal guardian and she is with him today.   Seen as hospital consult by Dr. Clois on 12/23/23 for lumbar stenosis. He recommended PT and outpatient f/u.   He has no current LBP. He states most of his pain is in his knees, but he this is intermittent and not hurting him currently. He is doing PT at his facility and states he is doing well. He still has intermittent weakness in the legs.  No numbness or tingling in his legs.   He does not smoke. Former smoker   Bowel/Bladder Dysfunction: none  Conservative measures:  Physical therapy: has participated in PT at [place], and is in PT now at [facility]  Multimodal medical therapy including regular antiinflammatories: Tylenol  Injections: no epidural steroid injections  Past Surgery: no spinal surgeries.  Review of Systems:  A 10 point review of systems is negative, except for the pertinent positives and negatives detailed in the HPI.  Past Medical History: Past Medical History:  Diagnosis Date   Actinic keratosis    Arthritis    Asthma    COPD (chronic obstructive pulmonary disease) (HCC)    COVID-19 02/13/2021   Diabetes mellitus without complication (HCC)     2015   Dyspnea    with exertion   Edema    GERD (gastroesophageal reflux disease)    HOH (hard of hearing)    AIDS   Hyperlipidemia    Hypertension    Wheezing     Past Surgical History: Past Surgical History:  Procedure Laterality Date   BUNIONECTOMY Right    CATARACT EXTRACTION W/PHACO Left 08/22/2017   Procedure: CATARACT EXTRACTION PHACO AND INTRAOCULAR LENS PLACEMENT (IOC);  Surgeon: Jaye Fallow, MD;  Location: [facility] ORS;  Service:  Ophthalmology;  Laterality: Left;  US  00:43.7 AP% 12.3 CDE 5.38 Fluid pack lot # [id number]   COLONOSCOPY  2016   ELECTROMAGNETIC NAVIGATION BROCHOSCOPY N/A 02/09/2017   Procedure: ELECTROMAGNETIC NAVIGATION BRONCHOSCOPY;  Surgeon: Isaiah Scrivener, MD;  Location: [facility] ORS;  Service: Cardiopulmonary;  Laterality: N/A;   EYE SURGERY Right    Cataract Extraction with IOL   HAMMER TOE SURGERY Right    TONSILLECTOMY  1938    Allergies: Allergies as of 01/30/2024 - Review Complete 01/30/2024  Allergen Reaction Noted   Clindamycin Hives 02/05/2014   Lisinopril Cough 09/04/2018   Penicillin v potassium Rash and Other (See Comments) 02/20/2014    Medications: Outpatient Encounter Medications as of 01/30/2024  Medication Sig   Accu-Chek FastClix Lancets MISC Use up to Once daily as Directed  DX:  E11.9   ACCU-CHEK GUIDE test strip USE UP TO ONCE DAILY AS DIRECTED DX: E11.9   acetaminophen  (TYLENOL ) 500 MG tablet Take 1,000 mg by mouth every 6 (six) hours as needed for mild pain or moderate pain.   albuterol  (VENTOLIN  HFA) 108 (90 Base) MCG/ACT inhaler Inhale 2 puffs into the lungs every 6 (six) hours as needed for wheezing or shortness of breath. (Patient taking differently: Inhale 2 puffs into the lungs every 12 (twelve) hours.)   amLODipine  (NORVASC ) 10 MG tablet Take 0.5 tablets (5 mg total) by mouth daily.   aspirin  EC 81 MG tablet Take 1 tablet (81 mg  total) by mouth daily. Swallow whole.   Azelastine  HCl 137 MCG/SPRAY SOLN PLACE 2 SPRAYS INTO BOTH NOSTRILS 2 (TWO) TIMES DAILY. USE IN EACH NOSTRIL AS DIRECTED   blood glucose meter kit and supplies KIT Dispense based on patient and insurance preference. Use once daily as directed. (FOR ICD-10  E11.9).   calcium  carbonate (TUMS - DOSED IN MG ELEMENTAL CALCIUM ) 500 MG chewable tablet Chew 1-2 tablets by mouth 3 (three) times daily as needed (for acid reflux/indigestion.).   carboxymethylcellulose (REFRESH PLUS) 0.5 % SOLN Apply 1 drop to eye 3  (three) times daily as needed (dry eyes).   Cholecalciferol  (VITAMIN D -3) 5000 units TABS Take 5,000 Units by mouth daily.    dextromethorphan -guaiFENesin  (MUCINEX  DM) 30-600 MG 12hr tablet Take 1 tablet by mouth 2 (two) times daily as needed for cough.   DUPIXENT  300 MG/2ML prefilled syringe INJECT 1 SYRINGE UNDER THE SKIN EVERY OTHER WEEK   Emollient (CERAVE DAILY MOISTURIZING) LOTN Apply 1 application topically daily as needed (dry skin).   feeding supplement (ENSURE ENLIVE / ENSURE PLUS) LIQD Take 237 mLs by mouth 2 (two) times daily between meals.   finasteride  (PROSCAR ) 5 MG tablet Take 1 tablet (5 mg total) by mouth daily.   Fluticasone -Umeclidin-Vilant (TRELEGY ELLIPTA ) 100-62.5-25 MCG/ACT AEPB Inhale 1 puff into the lungs daily.   ipratropium-albuterol  (DUONEB) 0.5-2.5 (3) MG/3ML SOLN Take 3 mLs by nebulization every 4 (four) hours as needed.   latanoprost  (XALATAN ) 0.005 % ophthalmic solution Place 1 drop into the left eye at bedtime.   metFORMIN  (GLUCOPHAGE ) 500 MG tablet TAKE 1 TABLET BY MOUTH TWICE A DAY   mometasone -formoterol  (DULERA ) 100-5 MCG/ACT AERO Inhale 2 puffs into the lungs every 12 (twelve) hours as needed for wheezing.   montelukast  (SINGULAIR ) 10 MG tablet Take 1 tablet (10 mg total) by mouth at bedtime.   Multiple Vitamin (MULTIVITAMIN WITH MINERALS) TABS tablet Take 1 tablet by mouth daily.   Multiple Vitamins-Minerals (PRESERVISION AREDS PO) Take 1 capsule by mouth in the morning and at bedtime.   polyethylene glycol (MIRALAX  / GLYCOLAX ) 17 g packet Take 17 g by mouth daily.   rosuvastatin  (CRESTOR ) 20 MG tablet TAKE 1 TABLET BY MOUTH EVERY DAY   sodium chloride  (OCEAN) 0.65 % SOLN nasal spray Place 1 spray into both nostrils as needed for congestion.   tamsulosin  (FLOMAX ) 0.4 MG CAPS capsule Take 1 capsule (0.4 mg total) by mouth daily.   Facility-Administered Encounter Medications as of 01/30/2024  Medication   dupilumab  (DUPIXENT ) prefilled syringe 300 mg     Social History: Social History   Tobacco Use   Smoking status: Former    Current packs/day: 0.00    Average packs/day: 2.0 packs/day for 35.0 years (70.0 ttl pk-yrs)    Types: Cigarettes    Start date: 10/31/1950    Quit date: 10/30/1985    Years since quitting: 38.2   Smokeless tobacco: Never  Vaping Use   Vaping status: Never Used  Substance Use Topics   Alcohol  use: Yes    Comment: occasional   Drug use: No    Family Medical History: Family History  Problem Relation Age of Onset   Cancer Mother    Stomach cancer Mother    Stomach cancer Sister     Physical Examination: Vitals:   01/30/24 1003  BP: (!) 140/82    General: Patient is well developed, well nourished, calm, collected, and in no apparent distress. Attention to examination is appropriate.  Respiratory: Patient is breathing without any  difficulty.  He has no lower lumbar tenderness.   No abnormal lesions on exposed skin.   Strength: Side Biceps Triceps Deltoid Interossei Grip Wrist Ext. Wrist Flex.  R 5 5 5 5 5 5 5   L 5 5 5 5 5 5 5    Side Iliopsoas Quads Hamstring PF DF EHL  R 5 5 5 5 5 5   L 5 5 5 5 5 5    Reflexes are 2+ and symmetric at the biceps, brachioradialis, patella and achilles.   Hoffman's is absent.  Clonus is not present.   Bilateral upper and lower extremity sensation is intact to light touch.     No pain with IR/ER of both hips.   He has mild joint line tenderness right knee. No tenderness at left knee.   Gait is not tested. He is in a WC.   Medical Decision Making  Imaging: none  Assessment and Plan: Mr. Canavan no current LBP. Primary complaint is intermittent bilateral knee pain. He has no current pain. He still has intermittent weakness in the legs.  No numbness or tingling in his legs.   He has known severe central stenosis L4-L5 with multlilevel foraminal stenosis. Thoracic MRI also showed syric from T8 to T12.   Primary pain appears to be knee mediated. No current  LBP or radicular leg pain.   Treatment options discussed with patient and his daugher. Following plan made:   - Continue with PT at facility for progressive mobilization.  - If he develops back or radicular leg pain, can consider lumbar injections.  - Referral to ortho for bilateral knee pain. Orders to [facility]- he wants to be see in [place].  - He will follow up prn for his back.   I spent a total of 30 minutes in face-to-face and non-face-to-face activities related to this patient's care today including review of outside records, review of imaging, review of symptoms, physical exam, discussion of differential diagnosis, discussion of treatment options, and documentation.   Thank you for involving me in the care of this patient.   Glade Boys PA-C Dept. of Neurosurgery

## 2024-01-24 ENCOUNTER — Ambulatory Visit (INDEPENDENT_AMBULATORY_CARE_PROVIDER_SITE_OTHER): Payer: Medicare Other | Admitting: Dermatology

## 2024-01-24 ENCOUNTER — Encounter: Payer: Self-pay | Admitting: Dermatology

## 2024-01-24 DIAGNOSIS — L578 Other skin changes due to chronic exposure to nonionizing radiation: Secondary | ICD-10-CM | POA: Diagnosis not present

## 2024-01-24 DIAGNOSIS — Z7189 Other specified counseling: Secondary | ICD-10-CM

## 2024-01-24 DIAGNOSIS — L853 Xerosis cutis: Secondary | ICD-10-CM | POA: Diagnosis not present

## 2024-01-24 DIAGNOSIS — W908XXA Exposure to other nonionizing radiation, initial encounter: Secondary | ICD-10-CM

## 2024-01-24 DIAGNOSIS — Z79899 Other long term (current) drug therapy: Secondary | ICD-10-CM

## 2024-01-24 DIAGNOSIS — L209 Atopic dermatitis, unspecified: Secondary | ICD-10-CM

## 2024-01-24 DIAGNOSIS — L570 Actinic keratosis: Secondary | ICD-10-CM | POA: Diagnosis not present

## 2024-01-24 MED ORDER — DUPILUMAB 300 MG/2ML ~~LOC~~ SOSY
300.0000 mg | PREFILLED_SYRINGE | Freq: Once | SUBCUTANEOUS | Status: AC
  Administered 2024-01-24: 300 mg via SUBCUTANEOUS

## 2024-01-24 NOTE — Progress Notes (Signed)
 Follow-Up Visit   Subjective  Cody Bridges is a 89 y.o. male who presents for the following: 6 month follow up on atopic derm, currently on dupixent  and mometasone  cream as needed for flares. Patient reports doing well with no adverse reactions and no recent flares.   The patient has spots, moles and lesions to be evaluated, some may be new or changing and the patient may have concern these could be cancer.   The following portions of the chart were reviewed this encounter and updated as appropriate: medications, allergies, medical history  Review of Systems:  No other skin or systemic complaints except as noted in HPI or Assessment and Plan.  Objective  Well appearing patient in no apparent distress; mood and affect are within normal limits.  A focused examination was performed of the following areas: Face, ears, arms, hands, legs  Relevant exam findings are noted in the Assessment and Plan.  right ear x 1 Erythematous thin papules/macules with gritty scale.   Assessment & Plan   ATOPIC DERMATITIS Exam: scaly at arms and legs  % BSA Chronic and persistent condition with duration or expected duration over one year. Condition is bothersome/symptomatic for patient. Currently flared. Likely dure to recent hospitalization and rehab. Atopic dermatitis - Severe, on Dupixent  (biologic medication).  Atopic dermatitis (eczema) is a chronic, relapsing, pruritic condition that can significantly affect quality of life. It is often associated with allergic rhinitis and/or asthma and can require treatment with topical medications, phototherapy, or in severe cases biologic medications, which require long term medication management.   Treatment Plan: Continue Dupixent  300 mg/2mL SQ QOW. Patient denies side effects. Cont Mometasone  cr qd up to 5d/wk prn flares Cont Cerave cr daily with mild soap   Dupixent  300 mg/2 ml injected SQ into the right upper arm. Patient tolerated well with no  adverse reactions. Eleanor Blush CMA   NDC: 9975-4085-97 Lot: [id number] Exp: 01/28/2026   Potential side effects include allergic reaction, herpes infections, injection site reactions and conjunctivitis (inflammation of the eyes).  The use of Dupixent  requires long term medication management, including periodic office visits.     Long term medication management.  Patient is using long term (months to years) prescription medication  to control their dermatologic condition.  These medications require periodic monitoring to evaluate for efficacy and side effects and may require periodic laboratory monitoring.    Recommend gentle skin care.   Xerosis severe with peeling and flaking  Slightly worse Encourage Cerave moisturizerand mild soap  - diffuse xerotic patches - recommend gentle, hydrating skin care - gentle skin care handout given Recommend gentle skin care.   ACTINIC DAMAGE - chronic, secondary to cumulative UV radiation exposure/sun exposure over time - diffuse scaly erythematous macules with underlying dyspigmentation - Recommend daily broad spectrum sunscreen SPF 30+ to sun-exposed areas, reapply every 2 hours as needed.  - Recommend staying in the shade or wearing long sleeves, sun glasses (UVA+UVB protection) and wide brim hats (4-inch brim around the entire circumference of the hat). - Call for new or changing lesions.  ACTINIC KERATOSIS right ear x 1 Actinic keratoses are precancerous spots that appear secondary to cumulative UV radiation exposure/sun exposure over time. They are chronic with expected duration over 1 year. A portion of actinic keratoses will progress to squamous cell carcinoma of the skin. It is not possible to reliably predict which spots will progress to skin cancer and so treatment is recommended to prevent development of skin cancer.  Recommend  daily broad spectrum sunscreen SPF 30+ to sun-exposed areas, reapply every 2 hours as needed.  Recommend staying  in the shade or wearing long sleeves, sun glasses (UVA+UVB protection) and wide brim hats (4-inch brim around the entire circumference of the hat). Call for new or changing lesions. Destruction of lesion - right ear x 1 Complexity: simple   Destruction method: cryotherapy   Informed consent: discussed and consent obtained   Timeout:  patient name, date of birth, surgical site, and procedure verified Lesion destroyed using liquid nitrogen: Yes   Region frozen until ice ball extended beyond lesion: Yes   Outcome: patient tolerated procedure well with no complications   Post-procedure details: wound care instructions given   ATOPIC DERMATITIS, UNSPECIFIED TYPE   Related Medications DUPIXENT  300 MG/2ML prefilled syringe INJECT 1 SYRINGE UNDER THE SKIN EVERY OTHER WEEK dupilumab  (DUPIXENT ) prefilled syringe 300 mg  dupilumab  (DUPIXENT ) prefilled syringe 300 mg   Return in about 7 months (around 08/25/2024) for atopic derm follow up.  IEleanor Blush, CMA, am acting as scribe for Alm Rhyme, MD.   Documentation: I have reviewed the above documentation for accuracy and completeness, and I agree with the above.  Alm Rhyme, MD

## 2024-01-24 NOTE — Patient Instructions (Addendum)
Dupilumab (Dupixent) is a treatment given by injection for adults and children with moderate-to-severe atopic dermatitis. Goal is control of skin condition, not cure. It is given as 2 injections at the first dose followed by 1 injection ever 2 weeks thereafter.  Young children are dosed monthly.  Potential side effects include allergic reaction, herpes infections, injection site reactions and conjunctivitis (inflammation of the eyes).  The use of Dupixent requires long term medication management, including periodic office visits.  Due to recent changes in healthcare laws, you may see results of your pathology and/or laboratory studies on MyChart before the doctors have had a chance to review them. We understand that in some cases there may be results that are confusing or concerning to you. Please understand that not all results are received at the same time and often the doctors may need to interpret multiple results in order to provide you with the best plan of care or course of treatment. Therefore, we ask that you please give [place] 2 business days to thoroughly review all your results before contacting the office for clarification. Should we see a critical lab result, you will be contacted sooner.   If You Need Anything After Your Visit  If you have any questions or concerns for your doctor, please call our main line at [phone] and press option 4 to reach your doctor's medical assistant. If no one answers, please leave a voicemail as directed and we will return your call as soon as possible. Messages left after 4 pm will be answered the following business day.   You may also send [place] a message via MyChart. We typically respond to MyChart messages within 1-2 business days.  For prescription refills, please ask your pharmacy to contact our office. Our fax number is [phone].  If you have an urgent issue when the clinic is closed that cannot wait until the next business day, you can page your  doctor at the number below.    Please note that while we do our best to be available for urgent issues outside of office hours, we are not available 24/7.   If you have an urgent issue and are unable to reach [place], you may choose to seek medical care at your doctor's office, retail clinic, urgent care center, or emergency room.  If you have a medical emergency, please immediately call 911 or go to the emergency department.  Pager Numbers  - Dr. Gwen Pounds: [phone]  - Dr. Roseanne Reno: [phone]  - Dr. Katrinka Blazing: [phone]   In the event of inclement weather, please call our main line at [phone] for an update on the status of any delays or closures.  Dermatology Medication Tips: Please keep the boxes that topical medications come in in order to help keep track of the instructions about where and how to use these. Pharmacies typically print the medication instructions only on the boxes and not directly on the medication tubes.   If your medication is too expensive, please contact our office at [phone] option 4 or send [place] a message through MyChart.   We are unable to tell what your co-pay for medications will be in advance as this is different depending on your insurance coverage. However, we may be able to find a substitute medication at lower cost or fill out paperwork to get insurance to cover a needed medication.   If a prior authorization is required to get your medication covered by your insurance company, please allow [place] 1-2 business days to complete  this process.  Drug prices often vary depending on where the prescription is filled and some pharmacies may offer cheaper prices.  The website www.goodrx.com contains coupons for medications through different pharmacies. The prices here do not account for what the cost may be with help from insurance (it may be cheaper with your insurance), but the website can give you the price if you did not use any insurance.  - You can print  the associated coupon and take it with your prescription to the pharmacy.  - You may also stop by our office during regular business hours and pick up a GoodRx coupon card.  - If you need your prescription sent electronically to a different pharmacy, notify our office through [facility] or by phone at [phone] option 4.     Si Usted Necesita Algo Despus de Su Visita  Tambin puede enviarnos un mensaje a travs de Clinical cytogeneticist. Por lo general respondemos a los mensajes de MyChart en el transcurso de 1 a 2 das hbiles.  Para renovar recetas, por favor pida a su farmacia que se ponga en contacto con nuestra oficina. Annie Sable de fax es [place] [phone].  Si tiene un asunto urgente cuando la clnica est cerrada y que no puede esperar hasta el siguiente da hbil, puede llamar/localizar a su doctor(a) al nmero que aparece a continuacin.   Por favor, tenga en cuenta que aunque hacemos todo lo posible para estar disponibles para asuntos urgentes fuera del horario de [place], no estamos disponibles las 24 horas del da, los 7 [address]  [address] de la [place].   Si tiene un problema urgente y no puede comunicarse con nosotros, puede optar por buscar atencin mdica  en el consultorio de su doctor(a), en una clnica privada, en un centro de atencin urgente o en una sala de emergencias.  Si tiene Engineer, drilling, por favor llame inmediatamente al 911 o vaya a la sala de emergencias.  Nmeros de bper  - Dr. Gwen Pounds: [phone]  - Dra. Roseanne Reno: [phone]  - Dr. Katrinka Blazing: [phone]   En caso de inclemencias del tiempo, por favor llame a Lacy Duverney principal al [phone] para una actualizacin sobre el [place] de cualquier retraso o cierre.  Consejos para la medicacin en dermatologa: Por favor, guarde las cajas en las que vienen los medicamentos de uso tpico para ayudarle a seguir las instrucciones sobre dnde y cmo usarlos. Las farmacias generalmente imprimen las  instrucciones del medicamento slo en las cajas y no directamente en los tubos del [place].   Si su medicamento es muy caro, por favor, pngase en contacto con Rolm Gala llamando al [phone] y presione la opcin 4 o envenos un mensaje a travs de Clinical cytogeneticist.   No podemos decirle cul ser su copago por los medicamentos por adelantado ya que esto es diferente dependiendo de la cobertura de su seguro. Sin embargo, es posible que podamos encontrar un medicamento sustituto a Audiological scientist un formulario para que el seguro cubra el medicamento que se considera necesario.   Si se requiere una autorizacin previa para que su compaa de seguros [place] su medicamento, por favor permtanos de 1 a 2 das hbiles para completar [address]  Los precios de los medicamentos varan con frecuencia dependiendo del Environmental consultant de dnde se surte la receta y alguna farmacias pueden ofrecer precios ms baratos.  El sitio web www.goodrx.com tiene cupones para medicamentos de Health and safety inspector. Los precios aqu no tienen en cuenta lo que podra costar con la ayuda del  seguro (puede ser ms barato con su seguro), pero el sitio web puede darle el precio si no Visual merchandiser.  - Puede imprimir el cupn correspondiente y llevarlo con su receta a la farmacia.  - Tambin puede pasar por nuestra oficina durante el horario de atencin regular y Education officer, museum una tarjeta de cupones de GoodRx.  - Si necesita que su receta se enve electrnicamente a una farmacia diferente, informe a nuestra oficina a travs de MyChart de [place] o por telfono llamando al [phone] y presione la opcin 4.

## 2024-01-30 ENCOUNTER — Encounter: Payer: Self-pay | Admitting: Orthopedic Surgery

## 2024-01-30 ENCOUNTER — Ambulatory Visit: Admitting: Orthopedic Surgery

## 2024-01-30 VITALS — BP 140/82 | Ht 70.0 in | Wt 214.0 lb

## 2024-01-30 DIAGNOSIS — M25562 Pain in left knee: Secondary | ICD-10-CM | POA: Diagnosis not present

## 2024-01-30 DIAGNOSIS — M48061 Spinal stenosis, lumbar region without neurogenic claudication: Secondary | ICD-10-CM | POA: Diagnosis not present

## 2024-01-30 DIAGNOSIS — R29898 Other symptoms and signs involving the musculoskeletal system: Secondary | ICD-10-CM | POA: Diagnosis not present

## 2024-01-30 DIAGNOSIS — M47816 Spondylosis without myelopathy or radiculopathy, lumbar region: Secondary | ICD-10-CM

## 2024-01-30 DIAGNOSIS — M25561 Pain in right knee: Secondary | ICD-10-CM | POA: Diagnosis not present

## 2024-01-30 NOTE — Patient Instructions (Signed)
 It was so nice to see you today. Thank you so much for coming in.    You have some wear and tear in your back with spinal stenosis. I don't think you are having any symptoms from this.   I think your knee pain is coming from your knees. I want you to see ortho here in [facility] for evaluation of this. They should call you to schedule an appointment or you can call them at below number.   You can follow up with me as needed for your back.   Please do not hesitate to call if you have any questions or concerns. You can also message me in MyChart.   Glade Boys PA-C [phone]     The physicians and staff at [facility] at [facility] are committed to providing excellent care. You may receive a survey asking for feedback about your experience at our office. We value you your feedback and appreciate you taking the time to to fill it out. The [facility] leadership team is also available to discuss your experience in person, feel free to contact us  [phone].

## 2024-02-07 ENCOUNTER — Ambulatory Visit

## 2024-02-07 DIAGNOSIS — L209 Atopic dermatitis, unspecified: Secondary | ICD-10-CM | POA: Diagnosis not present

## 2024-02-07 NOTE — Progress Notes (Signed)
 Patient here today for two week Dupixent  injection for severe atopic dermatitis.    Dupixent  300mg /38mL injected into right upper arm. Patient tolerated well.    LOT: [id number] EXP: 10/2025 NDC: 9975-4085-97   Stefano Saba, RMA

## 2024-02-21 ENCOUNTER — Ambulatory Visit

## 2024-02-22 ENCOUNTER — Ambulatory Visit

## 2024-02-22 DIAGNOSIS — L209 Atopic dermatitis, unspecified: Secondary | ICD-10-CM | POA: Diagnosis not present

## 2024-02-22 NOTE — Progress Notes (Signed)
 Patient here today for two week Dupixent  injection for severe atopic dermatitis.    Dupixent  300mg /36mL injected into left upper arm. Patient tolerated well.   LOT: [id number]  EXP: 11/2025 NDC: 1478-2956-21    Lisbeth Rides, RMA

## 2024-03-07 ENCOUNTER — Ambulatory Visit

## 2024-03-07 DIAGNOSIS — L209 Atopic dermatitis, unspecified: Secondary | ICD-10-CM

## 2024-03-07 NOTE — Progress Notes (Signed)
 Patient here today for two week Dupixent  injection for severe atopic dermatitis.    Dupixent  300mg /85mL injected into right upper arm. Patient tolerated well.    LOT: [id number] EXP: 02/28/2026 NDC: 9975-4085-97   Alan Pizza, RMA

## 2024-03-21 ENCOUNTER — Ambulatory Visit

## 2024-03-25 ENCOUNTER — Ambulatory Visit

## 2024-03-28 ENCOUNTER — Ambulatory Visit (INDEPENDENT_AMBULATORY_CARE_PROVIDER_SITE_OTHER)

## 2024-03-28 DIAGNOSIS — L209 Atopic dermatitis, unspecified: Secondary | ICD-10-CM

## 2024-03-28 MED ORDER — DUPILUMAB 300 MG/2ML ~~LOC~~ SOAJ
300.0000 mg | SUBCUTANEOUS | Status: AC
  Administered 2024-03-28 – 2024-06-05 (×5): 300 mg via SUBCUTANEOUS

## 2024-03-28 NOTE — Progress Notes (Signed)
 Patient here today for two week Dupixent  injection for severe atopic dermatitis.    Dupixent  300mg /39mL injected into left upper arm. Patient tolerated well.    LOT: [id number] EXP: 02/28/2026 NDC: 9975-4085-97   Alan Pizza, RMA

## 2024-04-11 ENCOUNTER — Ambulatory Visit

## 2024-04-11 DIAGNOSIS — L209 Atopic dermatitis, unspecified: Secondary | ICD-10-CM

## 2024-04-11 NOTE — Progress Notes (Signed)
 Patient here today for two week Dupixent  injection for severe atopic dermatitis.    Dupixent  300mg /8mL injected into right upper arm. Patient tolerated well.    LOT: [id number] EXP: 03/01/2026 NDC: 9975-4085-97   Alan Pizza, RMA

## 2024-04-25 ENCOUNTER — Ambulatory Visit (INDEPENDENT_AMBULATORY_CARE_PROVIDER_SITE_OTHER)

## 2024-04-25 DIAGNOSIS — L209 Atopic dermatitis, unspecified: Secondary | ICD-10-CM | POA: Diagnosis not present

## 2024-04-25 NOTE — Progress Notes (Signed)
 Patient here today for two week Dupixent  injection for severe atopic dermatitis.    Dupixent  300mg /24mL injected into left upper arm. Patient tolerated well.    LOT: [id number] EXP: 03/01/2026 NDC: 9975-4085-97   Alan Pizza, RMA

## 2024-05-09 ENCOUNTER — Ambulatory Visit

## 2024-05-15 ENCOUNTER — Ambulatory Visit

## 2024-05-22 ENCOUNTER — Ambulatory Visit

## 2024-05-22 DIAGNOSIS — L209 Atopic dermatitis, unspecified: Secondary | ICD-10-CM | POA: Diagnosis not present

## 2024-05-22 NOTE — Progress Notes (Signed)
 Patient here today for two week Dupixent  injection for severe atopic dermatitis.    Dupixent  300mg /59mL injected into right upper arm. Patient tolerated well.    LOT: [id number] EXP: 07/31/2025 NDC: 9975-4084-79   Alan Pizza, RMA

## 2024-05-23 ENCOUNTER — Telehealth: Payer: Self-pay

## 2024-05-23 MED ORDER — DUPIXENT 300 MG/2ML ~~LOC~~ SOAJ: 300.0000 mg | SUBCUTANEOUS | 5 refills | Status: AC

## 2024-05-23 NOTE — Telephone Encounter (Signed)
 New RX sent to [facility].   Patient has been using samples. aw

## 2024-06-05 ENCOUNTER — Ambulatory Visit

## 2024-06-05 DIAGNOSIS — L209 Atopic dermatitis, unspecified: Secondary | ICD-10-CM | POA: Diagnosis not present

## 2024-06-05 NOTE — Progress Notes (Signed)
 Patient here today for two week Dupixent  injection for severe atopic dermatitis.    Dupixent  300mg /30mL injected into left upper arm. Patient tolerated well.    LOT: [id number] EXP: 01/28/2026 NDC: 9975-4084-79   Stefano Saba, RMA

## 2024-06-19 ENCOUNTER — Ambulatory Visit

## 2024-08-28 ENCOUNTER — Ambulatory Visit: Admitting: Dermatology
